# Patient Record
Sex: Female | Born: 1950 | Race: Black or African American | Hispanic: No | Smoking: Former smoker
Health system: Southern US, Community
[De-identification: ages and names within clinical notes are randomized; demographics above are authoritative.]

## PROBLEM LIST (undated history)

## (undated) DIAGNOSIS — G5600 Carpal tunnel syndrome, unspecified upper limb: Secondary | ICD-10-CM

## (undated) DIAGNOSIS — J449 Chronic obstructive pulmonary disease, unspecified: Secondary | ICD-10-CM

## (undated) DIAGNOSIS — M1990 Unspecified osteoarthritis, unspecified site: Secondary | ICD-10-CM

## (undated) DIAGNOSIS — I10 Essential (primary) hypertension: Secondary | ICD-10-CM

## (undated) DIAGNOSIS — C801 Malignant (primary) neoplasm, unspecified: Secondary | ICD-10-CM

## (undated) DIAGNOSIS — E7800 Pure hypercholesterolemia, unspecified: Secondary | ICD-10-CM

## (undated) HISTORY — DX: Unspecified osteoarthritis, unspecified site: M19.90

## (undated) HISTORY — PX: REPLACEMENT TOTAL KNEE: SUR1224

## (undated) HISTORY — DX: Carpal tunnel syndrome, unspecified upper limb: G56.00

## (undated) HISTORY — PX: TOENAIL EXCISION: SUR558

## (undated) HISTORY — PX: ABDOMINAL HYSTERECTOMY: SHX81

## (undated) HISTORY — PX: ABDOMINAL SURGERY: SHX537

## (undated) HISTORY — PX: CHOLECYSTECTOMY: SHX55

---

## 2001-02-08 ENCOUNTER — Encounter: Payer: Self-pay | Admitting: Orthopedic Surgery

## 2001-02-08 ENCOUNTER — Ambulatory Visit (HOSPITAL_COMMUNITY): Admission: RE | Admit: 2001-02-08 | Discharge: 2001-02-08 | Payer: Self-pay | Admitting: Orthopedic Surgery

## 2001-03-05 ENCOUNTER — Ambulatory Visit (HOSPITAL_COMMUNITY): Admission: RE | Admit: 2001-03-05 | Discharge: 2001-03-05 | Payer: Self-pay | Admitting: Orthopedic Surgery

## 2002-06-23 ENCOUNTER — Encounter: Payer: Self-pay | Admitting: Emergency Medicine

## 2002-06-23 ENCOUNTER — Emergency Department (HOSPITAL_COMMUNITY): Admission: EM | Admit: 2002-06-23 | Discharge: 2002-06-23 | Payer: Self-pay | Admitting: Emergency Medicine

## 2002-10-21 ENCOUNTER — Encounter: Payer: Self-pay | Admitting: Family Medicine

## 2002-10-21 ENCOUNTER — Inpatient Hospital Stay (HOSPITAL_COMMUNITY): Admission: AD | Admit: 2002-10-21 | Discharge: 2002-10-24 | Payer: Self-pay | Admitting: Family Medicine

## 2002-12-10 ENCOUNTER — Ambulatory Visit (HOSPITAL_COMMUNITY): Admission: RE | Admit: 2002-12-10 | Discharge: 2002-12-10 | Payer: Self-pay | Admitting: Family Medicine

## 2002-12-11 ENCOUNTER — Ambulatory Visit (HOSPITAL_COMMUNITY): Admission: RE | Admit: 2002-12-11 | Discharge: 2002-12-11 | Payer: Self-pay | Admitting: Family Medicine

## 2002-12-11 ENCOUNTER — Encounter: Payer: Self-pay | Admitting: Family Medicine

## 2003-06-28 ENCOUNTER — Ambulatory Visit: Admission: RE | Admit: 2003-06-28 | Discharge: 2003-06-28 | Payer: Self-pay | Admitting: *Deleted

## 2004-01-05 ENCOUNTER — Inpatient Hospital Stay (HOSPITAL_COMMUNITY): Admission: AD | Admit: 2004-01-05 | Discharge: 2004-01-08 | Payer: Self-pay | Admitting: Family Medicine

## 2004-01-28 ENCOUNTER — Encounter: Payer: Self-pay | Admitting: Orthopedic Surgery

## 2004-05-05 ENCOUNTER — Ambulatory Visit (HOSPITAL_COMMUNITY): Admission: RE | Admit: 2004-05-05 | Discharge: 2004-05-05 | Payer: Self-pay | Admitting: General Surgery

## 2004-06-03 ENCOUNTER — Inpatient Hospital Stay (HOSPITAL_COMMUNITY): Admission: RE | Admit: 2004-06-03 | Discharge: 2004-06-07 | Payer: Self-pay | Admitting: Orthopedic Surgery

## 2004-06-03 ENCOUNTER — Encounter: Payer: Self-pay | Admitting: Orthopedic Surgery

## 2004-07-18 ENCOUNTER — Encounter (HOSPITAL_COMMUNITY): Admission: RE | Admit: 2004-07-18 | Discharge: 2004-08-17 | Payer: Self-pay | Admitting: Orthopedic Surgery

## 2004-08-05 ENCOUNTER — Ambulatory Visit (HOSPITAL_COMMUNITY): Admission: RE | Admit: 2004-08-05 | Discharge: 2004-08-05 | Payer: Self-pay | Admitting: Family Medicine

## 2004-08-15 ENCOUNTER — Ambulatory Visit: Payer: Self-pay | Admitting: Orthopedic Surgery

## 2004-08-17 ENCOUNTER — Encounter (HOSPITAL_COMMUNITY): Admission: RE | Admit: 2004-08-17 | Discharge: 2004-09-16 | Payer: Self-pay | Admitting: Orthopedic Surgery

## 2004-09-05 ENCOUNTER — Ambulatory Visit: Payer: Self-pay | Admitting: Orthopedic Surgery

## 2004-11-03 ENCOUNTER — Ambulatory Visit: Payer: Self-pay | Admitting: Orthopedic Surgery

## 2005-01-25 ENCOUNTER — Ambulatory Visit (HOSPITAL_COMMUNITY): Admission: RE | Admit: 2005-01-25 | Discharge: 2005-01-25 | Payer: Self-pay | Admitting: Family Medicine

## 2005-02-10 ENCOUNTER — Ambulatory Visit (HOSPITAL_COMMUNITY): Admission: RE | Admit: 2005-02-10 | Discharge: 2005-02-10 | Payer: Self-pay | Admitting: Family Medicine

## 2005-02-20 ENCOUNTER — Ambulatory Visit: Payer: Self-pay | Admitting: Orthopedic Surgery

## 2005-03-08 ENCOUNTER — Emergency Department (HOSPITAL_COMMUNITY): Admission: EM | Admit: 2005-03-08 | Discharge: 2005-03-09 | Payer: Self-pay | Admitting: *Deleted

## 2005-03-09 ENCOUNTER — Inpatient Hospital Stay (HOSPITAL_COMMUNITY): Admission: AD | Admit: 2005-03-09 | Discharge: 2005-03-13 | Payer: Self-pay | Admitting: Family Medicine

## 2005-05-24 ENCOUNTER — Ambulatory Visit: Payer: Self-pay | Admitting: Orthopedic Surgery

## 2005-07-13 ENCOUNTER — Ambulatory Visit (HOSPITAL_COMMUNITY): Admission: RE | Admit: 2005-07-13 | Discharge: 2005-07-13 | Payer: Self-pay | Admitting: General Surgery

## 2005-09-18 HISTORY — PX: COLONOSCOPY: SHX174

## 2006-06-22 ENCOUNTER — Ambulatory Visit (HOSPITAL_COMMUNITY): Admission: RE | Admit: 2006-06-22 | Discharge: 2006-06-22 | Payer: Self-pay | Admitting: Family Medicine

## 2006-06-25 ENCOUNTER — Ambulatory Visit: Payer: Self-pay | Admitting: Orthopedic Surgery

## 2006-07-07 ENCOUNTER — Ambulatory Visit (HOSPITAL_COMMUNITY): Admission: RE | Admit: 2006-07-07 | Discharge: 2006-07-07 | Payer: Self-pay | Admitting: Family Medicine

## 2006-07-24 ENCOUNTER — Ambulatory Visit (HOSPITAL_COMMUNITY): Admission: RE | Admit: 2006-07-24 | Discharge: 2006-07-24 | Payer: Self-pay | Admitting: Internal Medicine

## 2006-07-24 ENCOUNTER — Ambulatory Visit: Payer: Self-pay | Admitting: Internal Medicine

## 2006-09-18 HISTORY — PX: ESOPHAGOGASTRODUODENOSCOPY: SHX1529

## 2006-10-29 ENCOUNTER — Ambulatory Visit: Payer: Self-pay | Admitting: Internal Medicine

## 2006-11-07 ENCOUNTER — Encounter (INDEPENDENT_AMBULATORY_CARE_PROVIDER_SITE_OTHER): Payer: Self-pay | Admitting: Specialist

## 2006-11-07 ENCOUNTER — Ambulatory Visit: Payer: Self-pay | Admitting: Internal Medicine

## 2006-11-07 ENCOUNTER — Ambulatory Visit (HOSPITAL_COMMUNITY): Admission: RE | Admit: 2006-11-07 | Discharge: 2006-11-07 | Payer: Self-pay | Admitting: Internal Medicine

## 2007-06-28 ENCOUNTER — Ambulatory Visit (HOSPITAL_COMMUNITY): Admission: RE | Admit: 2007-06-28 | Discharge: 2007-06-28 | Payer: Self-pay | Admitting: *Deleted

## 2007-07-16 ENCOUNTER — Ambulatory Visit (HOSPITAL_COMMUNITY): Admission: RE | Admit: 2007-07-16 | Discharge: 2007-07-16 | Payer: Self-pay | Admitting: Family Medicine

## 2007-10-09 ENCOUNTER — Ambulatory Visit (HOSPITAL_COMMUNITY): Admission: RE | Admit: 2007-10-09 | Discharge: 2007-10-09 | Payer: Self-pay | Admitting: Family Medicine

## 2007-10-27 ENCOUNTER — Emergency Department (HOSPITAL_COMMUNITY): Admission: EM | Admit: 2007-10-27 | Discharge: 2007-10-28 | Payer: Self-pay | Admitting: Emergency Medicine

## 2008-01-08 ENCOUNTER — Ambulatory Visit (HOSPITAL_COMMUNITY): Admission: RE | Admit: 2008-01-08 | Discharge: 2008-01-08 | Payer: Self-pay | Admitting: Family Medicine

## 2008-05-12 DIAGNOSIS — Z8679 Personal history of other diseases of the circulatory system: Secondary | ICD-10-CM

## 2008-05-12 DIAGNOSIS — E119 Type 2 diabetes mellitus without complications: Secondary | ICD-10-CM

## 2008-05-12 DIAGNOSIS — Z794 Long term (current) use of insulin: Secondary | ICD-10-CM

## 2008-05-12 DIAGNOSIS — N184 Chronic kidney disease, stage 4 (severe): Secondary | ICD-10-CM | POA: Insufficient documentation

## 2008-05-14 ENCOUNTER — Ambulatory Visit: Payer: Self-pay | Admitting: Orthopedic Surgery

## 2008-05-14 DIAGNOSIS — M5430 Sciatica, unspecified side: Secondary | ICD-10-CM

## 2008-05-14 DIAGNOSIS — M545 Low back pain: Secondary | ICD-10-CM

## 2008-07-17 ENCOUNTER — Ambulatory Visit (HOSPITAL_COMMUNITY): Admission: RE | Admit: 2008-07-17 | Discharge: 2008-07-17 | Payer: Self-pay | Admitting: Family Medicine

## 2008-09-23 ENCOUNTER — Ambulatory Visit: Payer: Self-pay | Admitting: Orthopedic Surgery

## 2008-09-23 DIAGNOSIS — Z96659 Presence of unspecified artificial knee joint: Secondary | ICD-10-CM | POA: Insufficient documentation

## 2008-09-23 DIAGNOSIS — M25569 Pain in unspecified knee: Secondary | ICD-10-CM

## 2008-09-23 DIAGNOSIS — M1710 Unilateral primary osteoarthritis, unspecified knee: Secondary | ICD-10-CM

## 2008-11-05 ENCOUNTER — Ambulatory Visit: Payer: Self-pay | Admitting: Orthopedic Surgery

## 2008-12-09 ENCOUNTER — Ambulatory Visit: Payer: Self-pay | Admitting: Orthopedic Surgery

## 2009-01-07 ENCOUNTER — Telehealth: Payer: Self-pay | Admitting: Orthopedic Surgery

## 2009-01-11 ENCOUNTER — Encounter: Payer: Self-pay | Admitting: Orthopedic Surgery

## 2009-01-12 ENCOUNTER — Inpatient Hospital Stay (HOSPITAL_COMMUNITY): Admission: RE | Admit: 2009-01-12 | Discharge: 2009-01-15 | Payer: Self-pay | Admitting: Orthopedic Surgery

## 2009-01-12 ENCOUNTER — Ambulatory Visit: Payer: Self-pay | Admitting: Orthopedic Surgery

## 2009-01-16 ENCOUNTER — Encounter: Payer: Self-pay | Admitting: Orthopedic Surgery

## 2009-01-18 ENCOUNTER — Telehealth: Payer: Self-pay | Admitting: Orthopedic Surgery

## 2009-01-26 ENCOUNTER — Ambulatory Visit: Payer: Self-pay | Admitting: Orthopedic Surgery

## 2009-02-03 ENCOUNTER — Telehealth: Payer: Self-pay | Admitting: Orthopedic Surgery

## 2009-02-03 ENCOUNTER — Encounter (INDEPENDENT_AMBULATORY_CARE_PROVIDER_SITE_OTHER): Payer: Self-pay | Admitting: *Deleted

## 2009-02-03 ENCOUNTER — Encounter: Payer: Self-pay | Admitting: Orthopedic Surgery

## 2009-02-09 ENCOUNTER — Encounter (HOSPITAL_COMMUNITY): Admission: RE | Admit: 2009-02-09 | Discharge: 2009-03-17 | Payer: Self-pay | Admitting: Orthopedic Surgery

## 2009-02-24 ENCOUNTER — Ambulatory Visit: Payer: Self-pay | Admitting: Orthopedic Surgery

## 2009-02-25 ENCOUNTER — Encounter: Payer: Self-pay | Admitting: Orthopedic Surgery

## 2009-02-26 ENCOUNTER — Encounter: Payer: Self-pay | Admitting: Orthopedic Surgery

## 2009-03-01 ENCOUNTER — Encounter: Payer: Self-pay | Admitting: Orthopedic Surgery

## 2009-03-15 ENCOUNTER — Encounter: Payer: Self-pay | Admitting: Orthopedic Surgery

## 2009-04-02 ENCOUNTER — Emergency Department (HOSPITAL_COMMUNITY): Admission: EM | Admit: 2009-04-02 | Discharge: 2009-04-02 | Payer: Self-pay | Admitting: Emergency Medicine

## 2009-04-07 ENCOUNTER — Ambulatory Visit: Payer: Self-pay | Admitting: Orthopedic Surgery

## 2009-05-03 ENCOUNTER — Telehealth: Payer: Self-pay | Admitting: Orthopedic Surgery

## 2009-07-07 ENCOUNTER — Ambulatory Visit: Payer: Self-pay | Admitting: Orthopedic Surgery

## 2009-07-19 ENCOUNTER — Ambulatory Visit (HOSPITAL_COMMUNITY): Admission: RE | Admit: 2009-07-19 | Discharge: 2009-07-19 | Payer: Self-pay | Admitting: Family Medicine

## 2009-09-29 ENCOUNTER — Telehealth: Payer: Self-pay | Admitting: Orthopedic Surgery

## 2009-09-29 ENCOUNTER — Ambulatory Visit: Payer: Self-pay | Admitting: Orthopedic Surgery

## 2009-09-29 DIAGNOSIS — M4800 Spinal stenosis, site unspecified: Secondary | ICD-10-CM

## 2009-09-29 DIAGNOSIS — M5126 Other intervertebral disc displacement, lumbar region: Secondary | ICD-10-CM

## 2009-10-04 ENCOUNTER — Encounter (INDEPENDENT_AMBULATORY_CARE_PROVIDER_SITE_OTHER): Payer: Self-pay | Admitting: *Deleted

## 2009-10-06 ENCOUNTER — Ambulatory Visit (HOSPITAL_COMMUNITY): Admission: RE | Admit: 2009-10-06 | Discharge: 2009-10-06 | Payer: Self-pay | Admitting: Orthopedic Surgery

## 2009-10-08 ENCOUNTER — Telehealth: Payer: Self-pay | Admitting: Orthopedic Surgery

## 2009-10-08 ENCOUNTER — Emergency Department (HOSPITAL_COMMUNITY): Admission: EM | Admit: 2009-10-08 | Discharge: 2009-10-09 | Payer: Self-pay | Admitting: Emergency Medicine

## 2009-10-13 ENCOUNTER — Ambulatory Visit: Payer: Self-pay | Admitting: Orthopedic Surgery

## 2009-10-14 ENCOUNTER — Telehealth: Payer: Self-pay | Admitting: Orthopedic Surgery

## 2009-10-28 ENCOUNTER — Ambulatory Visit: Payer: Self-pay | Admitting: Orthopedic Surgery

## 2009-11-18 ENCOUNTER — Ambulatory Visit: Payer: Self-pay | Admitting: Orthopedic Surgery

## 2009-12-09 ENCOUNTER — Encounter (INDEPENDENT_AMBULATORY_CARE_PROVIDER_SITE_OTHER): Payer: Self-pay | Admitting: *Deleted

## 2009-12-09 ENCOUNTER — Ambulatory Visit: Payer: Self-pay | Admitting: Orthopedic Surgery

## 2009-12-09 DIAGNOSIS — M5137 Other intervertebral disc degeneration, lumbosacral region: Secondary | ICD-10-CM

## 2010-01-05 ENCOUNTER — Telehealth: Payer: Self-pay | Admitting: Orthopedic Surgery

## 2010-01-19 ENCOUNTER — Ambulatory Visit: Payer: Self-pay | Admitting: Orthopedic Surgery

## 2010-02-21 ENCOUNTER — Ambulatory Visit: Payer: Self-pay | Admitting: Orthopedic Surgery

## 2010-04-25 ENCOUNTER — Ambulatory Visit: Payer: Self-pay | Admitting: Orthopedic Surgery

## 2010-05-02 ENCOUNTER — Encounter (HOSPITAL_COMMUNITY): Admission: RE | Admit: 2010-05-02 | Discharge: 2010-06-01 | Payer: Self-pay | Admitting: Orthopedic Surgery

## 2010-05-18 ENCOUNTER — Encounter: Payer: Self-pay | Admitting: Orthopedic Surgery

## 2010-06-03 ENCOUNTER — Encounter (HOSPITAL_COMMUNITY): Admission: RE | Admit: 2010-06-03 | Discharge: 2010-06-13 | Payer: Self-pay | Admitting: Orthopedic Surgery

## 2010-06-10 ENCOUNTER — Encounter: Payer: Self-pay | Admitting: Orthopedic Surgery

## 2010-06-27 ENCOUNTER — Ambulatory Visit: Payer: Self-pay | Admitting: Orthopedic Surgery

## 2010-07-21 ENCOUNTER — Ambulatory Visit (HOSPITAL_COMMUNITY): Admission: RE | Admit: 2010-07-21 | Discharge: 2010-07-21 | Payer: Self-pay | Admitting: Family Medicine

## 2010-07-26 ENCOUNTER — Telehealth: Payer: Self-pay | Admitting: Orthopedic Surgery

## 2010-09-29 ENCOUNTER — Encounter: Payer: Self-pay | Admitting: Orthopedic Surgery

## 2010-09-29 ENCOUNTER — Ambulatory Visit
Admission: RE | Admit: 2010-09-29 | Discharge: 2010-09-29 | Payer: Self-pay | Source: Home / Self Care | Attending: Orthopedic Surgery | Admitting: Orthopedic Surgery

## 2010-10-03 ENCOUNTER — Encounter (INDEPENDENT_AMBULATORY_CARE_PROVIDER_SITE_OTHER): Payer: Self-pay | Admitting: *Deleted

## 2010-10-08 ENCOUNTER — Encounter: Payer: Self-pay | Admitting: Family Medicine

## 2010-10-20 NOTE — Assessment & Plan Note (Signed)
Summary: 1 M RE-CK BACK/SEC HORIZ/CAF   Visit Type:  Follow-up  CC:  recheck back.  History of Present Illness: I saw [name] in the office today for a followup visit.  She is a 60 years old woman with the complaint of:  one month recheck back pain after chiropractor.  imaging: IMPRESSION: Mild degenerative disc disease at L2-3 and L5-S1.  Central canal and foramina are open at both levels.  No nerve root compression.  tried neurontin, lyrica.   Percocet 5 for pain, helps.  Chiropractor does not help much.  Has had 3 ESIs for back.  Left total knee xrays today, was supposed to come last month. DOS 12/2008         Allergies: 1)  ! Neurontin  Physical Exam  Additional Exam:  The knee continues to do well. She has excellent flexion. The knee. No swelling. No tenderness. She does have radiating pain from her hip down the lateral side. The leg into the foot.  The knee is very stable and anteriorly, posteriorly, and medial, lateral.     Impression & Recommendations:  Problem # 1:  TOTAL KNEE FOLLOW-UP (ICD-V43.65) Assessment Improved  postop x-rays and see me in the LEFT The  alignment was normal, PTF reduced without tilt or subluxation, no evidence of loosening.  IMPRESSION: normal appearance of the implant   Orders: Est. Patient Level III [id number]) Knee x-ray,  3 views [id number])  Problem # 2:  DEGENERATIVE DISC DISEASE, LUMBOSACRAL SPINE (ICD-722.52) Assessment: Unchanged  Orders: Est. Patient Level III [id number])  Medications Added to Medication List This Visit: 1)  Amitriptyline Hcl 50 Mg Tabs (Amitriptyline hcl) .Marland Kitchen.. 1 at bedtime  Patient Instructions: 1)  Try amitriptiline 2)  continue Percocet  3)  return in 4 weeks  Prescriptions: PERCOCET 5-325 MG TABS (OXYCODONE-ACETAMINOPHEN) 1 by mouth q 4 as needed pain  #90 x 0   Entered and Authorized by:   Arther Abbott MD   Signed by:   Arther Abbott MD on 01/19/2010   Method used:   Print then  Give to Patient   RxID:   [id number] AMITRIPTYLINE HCL 50 MG TABS (AMITRIPTYLINE HCL) 1 at bedtime  #30 x 2   Entered and Authorized by:   Arther Abbott MD   Signed by:   Arther Abbott MD on 01/19/2010   Method used:   Print then Give to Patient   RxID:   [id number]

## 2010-10-20 NOTE — Assessment & Plan Note (Signed)
Summary: 2 WK RE-CK BACK/SEC HORIZ/CAF   Visit Type:  Follow-up  CC:  back pain.  History of Present Illness: I saw [name] in the office today for a followup visit.  She is a 60 years old woman with the complaint of:  back pain.  Arthrotec and Percocet given last visit, could not afford Arthrotec.  Referred to Dr. Ace Gins for nerve block.  Today is 2 week recheck after med and Nerve block which was done 10/21/09.  Feels better.  The nerve block seemed to do well she is about 60% better.  Recommend getting a second.  She does not have any bowel or bladder dysfunction at this time  Recommocet 5 mg q.4 p.r.n. 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) 3)  Total Knee Follow-up  (ICD-V43.65) 4)  Knee, Arthritis, Degen.Jefm Miles  (ICD-715.96) 5)  Knee Pain  (ICD-719.46) 6)  Low Back Pain  (ICD-724.2) 7)  Sciatica  (ICD-724.3) 8)  Diabetes  (ICD-250.00) 9)  High Blood Pressure  (ICD-V12.50)  Current Medications (verified): 1)  Atenolol 2)  Verpapamil 3)  Metformin 4)  Lovastalin 5)  Pritosec 6)  Aspirin 7)  Triam Trene/hctz 8)  Crestor 10 Mg Tabs (Rosuvastatin Calcium) 9)  Apidra 100 Unit/ml Soln (Insulin Glulisine) 10)  Byetta 5 Mcg Pen 5 Mcg/0.13ml Soln (Exenatide) 11)  Avandamet 10-998 Mg Tabs (Rosiglitazone-Metformin) 12)  Ultracet 37.5-325 Mg Tabs (Tramadol-Acetaminophen) .Marland Kitchen.. 1 By Mouth Q 4 As Needed Pain 13)  Norco 5-325 Mg Tabs (Hydrocodone-Acetaminophen) .Marland Kitchen.. 1 By Mouth Q 4 As Needed Pain 14)  Lorcet Plus 7.5-650 Mg Tabs (Hydrocodone-Acetaminophen) .... One By Mouth Q 4 Hrs As Needed Pain 15)  Lomotil 2.5-0.025 Mg Tabs (Diphenoxylate-Atropine) .Marland Kitchen.. 1 By Mouth Q 4 As Needed Diarrhea 16)  Prednisone (Pak) 10 Mg Tabs (Prednisone) .... As Directed For 12 Days 17)  Percocet 5-325 Mg Tabs (Oxycodone-Acetaminophen) .Marland Kitchen.. 1  By Mouth Q 4 As Needed Pain 18)  Arthrotec 50 50-200 Mg-Mcg Tabs (Diclofenac-Misoprostol) .Marland Kitchen.. 1 By Mouth Two Times A Day   [has Ulcers]  Allergies (verified): 1)  ! 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 Patient Level II [id number])  Patient Instructions: 1)  return in 3 weeks  Prescriptions: PERCOCET 5-325 MG TABS (OXYCODONE-ACETAMINOPHEN) 1 by mouth q 4 as needed pain  #90 x 0   Entered and Authorized by:   Arther Abbott MD   Signed by:   Arther Abbott MD on 10/28/2009   Method used:   Print then Give to Patient   RxID:   [id number]

## 2010-10-20 NOTE — Miscellaneous (Signed)
Summary: referred to [facility] sent notes  Clinical Lists Changes

## 2010-10-20 NOTE — Assessment & Plan Note (Signed)
Summary: RECK AFTER 3RD ESA/SECURE HOR/BSF   Visit Type:  Follow-up  CC:  back pain.  History of Present Illness: 60 year old female treated for LEFT leg radiculopathy in the midst of a series of epidural injections  Medication Percocet for pain  IMPRESSION: Mild degenerative disc disease at L2-3 and L5-S1.  Central canal and foramina are open at both levels.  No nerve root compression.  L5-S1:  There is an annular tear and shallow right paracentral protrusion.  The central canal and foramina remain open.   she got all 3 shots her initial pain has subsided but she still has lingering pain which next her it's really by Percocet  We talked about possible further treatment options including surgery, therapy, chiropractic  she opted for chiropractic treatment at this time  Recommend one-month followup continue Percocet for pain    Allergies: 1)  ! Neurontin   Impression & Recommendations:  Problem # 1:  DEGENERATIVE DISC DISEASE, LUMBOSACRAL SPINE (ICD-722.52) Assessment Comment Only  Orders: Est. Patient Level II [id number])  Patient Instructions: 1)  Dr. Gypsy Decant chiropractor 2)  come back in a month

## 2010-10-20 NOTE — Miscellaneous (Signed)
Summary: Rehab Report  Rehab Report   Imported By: Ruffin Pyo 06/13/2010 08:55:42  _____________________________________________________________________  External Attachment:    Type:   Image     Comment:   External Document

## 2010-10-20 NOTE — Progress Notes (Signed)
Summary: Referral to Dr. Ace Gins.  Phone Note Outgoing Call   Call placed by: Santo Held,  October 14, 2009 2:23 PM Call placed to: Specialist Action Taken: Information Sent Summary of Call: I faxed a referral for this patient to Dr. Ace Gins to have a nerve block at L5.

## 2010-10-20 NOTE — Progress Notes (Signed)
Summary: wants Percocet prescription  Phone Note Call from Patient   Summary of Call: Devonn Sugawara ([birth year]) wants new prescription for percocet. Her # [phone] Initial call taken by: Ruffin Pyo,  January 05, 2010 10:40 AM    Prescriptions: PERCOCET 5-325 MG TABS (OXYCODONE-ACETAMINOPHEN) 1 by mouth q 4 as needed pain  #90 x 0   Entered and Authorized by:   Arther Abbott MD   Signed by:   Arther Abbott MD on 01/05/2010   Method used:   Print then Give to Patient   RxID:   [id number]

## 2010-10-20 NOTE — Letter (Signed)
Summary: medical records fax [facility]  medical records fax [facility]   Imported By: Ihor Austin 09/30/2010 18:17:41  _____________________________________________________________________  External Attachment:    Type:   Image     Comment:   External Document

## 2010-10-20 NOTE — Miscellaneous (Signed)
Summary: mri l spine aph 10/06/09 9:30am  Clinical Lists Changes   Secure horizons [phone] expires 11/18/09, MRI L spine without contrast scheduled for Wed 10/06/09 [facility] reg at 9:30am, to bring disc for fu appt in our office on 10/13/09 at 12 pm

## 2010-10-20 NOTE — Miscellaneous (Signed)
Summary: Physical therapy order  Physical therapy order   Imported By: Ihor Austin 04/25/2010 11:16:42  _____________________________________________________________________  External Attachment:    Type:   Image     Comment:   External Document

## 2010-10-20 NOTE — Progress Notes (Signed)
Summary: alot of pain  Phone Note Call from Patient Call back at Home Phone [phone]   Summary of Call: says her pain is aweful has to use cane, stopped taking lyrica because it made her feel weird, i called in steroid dose pack for her until she is seen for her results next week, any other suggestions. She is already taking Vicodin without relief. Initial call taken by: Peter Minium,  October 08, 2009 8:38 AM    New/Updated Medications: PREDNISONE (PAK) 10 MG TABS (PREDNISONE) as directed for 12 days Prescriptions: PREDNISONE (PAK) 10 MG TABS (PREDNISONE) as directed for 12 days  #1 x 0   Entered by:   Peter Minium   Authorized by:   Arther Abbott MD   Signed by:   Peter Minium on 10/08/2009   Method used:   Faxed to ...       [facility]  [address] (retail)       [place], [place]  [address]       Ph: [phone]       Fax: [phone]   RxID:   [id number]

## 2010-10-20 NOTE — Assessment & Plan Note (Signed)
Summary: 2 M RE-CK BACK/FOL'G PT/SEC HORIZ/CAF   Visit Type:  Follow-up  CC:  recheck back.  History of Present Illness:      Today is 2 month recheck back after taking Percocet 5, Amitryptiline and PT.  06/10/10 PT report in EMR.  The PT helped some, still having pain, the Amitryptiline helped her rest she is out of, Out of Percocet,   No numbness or tingling in legs, has crawling sensations in the legs, no cramping in legs, left leg feels weak.  overall her pain has decreased most of her pain continues to be in her lower back with occasionally radiating to the anterior portion of her LEFT thigh.  She would like to try a new pain medication other than Percocet.   Allergies: 1)  ! Neurontin   Impression & Recommendations:  Problem # 1:  DEGENERATIVE DISC DISEASE, LUMBOSACRAL SPINE (ICD-722.52)  Orders: Est. Patient Level II [id number])  Problem # 2:  SPINAL STENOSIS (ICD-724.00)  slightly improved recommend continue amitriptyline, and changed, hydrocodone  Orders: Est. Patient Level II [id number])  Patient Instructions: 1)  f/u in 3 months  2)  continue amitriptyline, and switch to Hydrococdone  Prescriptions: NORCO 5-325 MG TABS (HYDROCODONE-ACETAMINOPHEN) 1 by mouth q 4 as needed pain  #90 x 5   Entered and Authorized by:   Arther Abbott MD   Signed by:   Arther Abbott MD on 06/27/2010   Method used:   Print then Give to Patient   RxID:   [id number] AMITRIPTYLINE HCL 50 MG TABS (AMITRIPTYLINE HCL) 1 at bedtime  #60 x 5   Entered and Authorized by:   Arther Abbott MD   Signed by:   Arther Abbott MD on 06/27/2010   Method used:   Print then Give to Patient   RxID:   [id number]

## 2010-10-20 NOTE — Miscellaneous (Signed)
Summary: faxed referral to [facility] for esi has [facility] discount  Clinical Lists Changes

## 2010-10-20 NOTE — Progress Notes (Signed)
Summary: can't take Gabapentin  Phone Note Call from Patient   Summary of Call: [name] (28-Feb-2051) says she did not realize what medicine you had prescsribed until she got home.  Says she cannot take Gabapentin because it really messes with her mind and she is afraid to take it.  Uses [facility] in [place] Her # [phone] Initial call taken by: Ruffin Pyo,  September 29, 2009 2:07 PM  Follow-up for Phone Call        lyrica 50 mg three times a day  Follow-up by: Arther Abbott MD,  September 29, 2009 2:15 PM  Additional Follow-up for Phone Call Additional follow up Details #1::        routed to [facility] to call to pharmacy Additional Follow-up by: Ruffin Pyo,  September 29, 2009 4:29 PM    Additional Follow-up for Phone Call Additional follow up Details #2::    called it in to [facility] Follow-up by: Peter Minium,  September 29, 2009 4:45 PM  New/Updated Medications: LYRICA 50 MG CAPS (PREGABALIN) one by mouth tid Prescriptions: LYRICA 50 MG CAPS (PREGABALIN) one by mouth tid  #90 x 2   Entered by:   Peter Minium   Authorized by:   Arther Abbott MD   Signed by:   Peter Minium on 09/29/2009   Method used:   Telephoned to ...       [facility]  [address] (retail)       [address] [place], [place]  [address]       Ph: [phone]       Fax: [phone]   RxID:   [id number]

## 2010-10-20 NOTE — Progress Notes (Signed)
Summary: Wants Percocet prescription  Phone Note Call from Patient   Summary of Call: [name] ([birth year])  said the Hydrocodone is not helping her pain, even when she takes 2 tablets.  Asked if you will write a prescription for Percocet. Her # [phone] Initial call taken by: Ruffin Pyo,  July 26, 2010 3:49 PM    New/Updated Medications: PERCOCET 5-325 MG TABS (OXYCODONE-ACETAMINOPHEN) 1 q 6 hrs as needed pain Prescriptions: PERCOCET 5-325 MG TABS (OXYCODONE-ACETAMINOPHEN) 1 q 6 hrs as needed pain  #60 x 0   Entered and Authorized by:   Arther Abbott MD   Signed by:   Arther Abbott MD on 07/26/2010   Method used:   Print then Give to Patient   RxID:   [id number]

## 2010-10-20 NOTE — Assessment & Plan Note (Signed)
Summary: 3 M RE-CK BACK/RESP TO MED/SEC HORIZ/CAF   Visit Type:  Follow-up  CC:  recheck on back.  History of Present Illness:   This is a followup visit. The patient is complaining of increasing pain in her RIGHT leg RIGHT hip, radiating down into her RIGHT foot, which is severe, and unrelieved by hydrocodone.  Previously we have treated her for the following:   Problem # 1:  DEGENERATIVE DISC DISEASE, LUMBOSACRAL SPINE (ICD-722.52) Problem # 2:  SPINAL STENOSIS (ICD-724.00)    Her primary care doctor did put her on some steroids, which helped slightly.  She had the last MRI in January of last year, followed by 3 epidurals for LEFT leg pain.  She has not had any bowel or bladder dysfunction or infection. Symptoms such as fever or chills, or night sweats   Allergies: 1)  ! Neurontin  Physical Exam  Additional Exam:   * VS reviewed and were normal   *GEN: appearance was normal , moderate obesity  ** CDV: normal pulses temperature and no edema  * LYMPH nodes were normal   * SKIN was normal   * Neuro: normal sensation, reflexes remain intact equal and normal ** Psyche: AAO x 3 and mood was normal   straight leg raise is positive on the RIGHT at 30    Impression & Recommendations:  Problem # 1:  DEGENERATIVE DISC DISEASE, LUMBOSACRAL SPINE (ICD-722.52) Assessment Deteriorated  Orders: Misc. Referral (Misc. Ref) Est. Patient Level III [id number])  Problem # 2:  H N P-LUMBAR (ICD-722.10) Assessment: New possible exacerbation of previously disc disease.  Recommend epidural injection at L5, S1.  Resume amitriptyline, continue hydrocodone. Return in 6 weeks Misc. Referral (Misc. Ref)  Patient Instructions: 1)  ESI's again L5-S1 2)  continu hydrocodone &  3)  amitrytiline  4)  return in 6 weeks  Prescriptions: AMITRIPTYLINE HCL 50 MG TABS (AMITRIPTYLINE HCL) 1 at bedtime  #60 x 5   Entered and Authorized by:   Arther Abbott MD   Signed by:   Arther Abbott MD on 09/29/2010   Method used:   Print then Give to Patient   RxID:   [id number]    Orders Added: 1)  Misc. Referral [Misc. Ref] 2)  Est. Patient Level III [id number]

## 2010-10-20 NOTE — Assessment & Plan Note (Signed)
Summary: 4 WK RE-CK BACK/SEC HORIZ/CAF   Visit Type:  Follow-up  CC:  back pain.  History of Present Illness: BACK PAIN: followup.  Patient still has some back pain.  But amitriptyline seems to help.  She is also on Percocet.  imaging: IMPRESSION: Mild degenerative disc disease at L2-3 and L5-S1.  Central canal and foramina are open at both levels.  No nerve root compression.  MEDS: tried neurontin, lyrica.   Percocet 5 for pain, helps.  She was given   Amitriptyline Hcl 50 Mg Tabs (Amitriptyline hcl) .Marland Kitchen.. 1 at bedtime, LAST VISIT; she says that is helping her.  Chiropractor does not help much.  Has had 3 ESIs for back.  Allergies: 1)  ! Neurontin   Impression & Recommendations:  Problem # 1:  DEGENERATIVE DISC DISEASE, LUMBOSACRAL SPINE (ICD-722.52) Assessment Improved  slightly improved continue Percocet and amitriptyline.  Orders: Est. Patient Level II [id number])  Patient Instructions: 1)  Please schedule a follow-up appointment in 2 months. Prescriptions: PERCOCET 5-325 MG TABS (OXYCODONE-ACETAMINOPHEN) 1 by mouth q 4 as needed pain  #90 x 0   Entered and Authorized by:   Arther Abbott MD   Signed by:   Arther Abbott MD on 02/21/2010   Method used:   Print then Give to Patient   RxID:   [id number]

## 2010-10-20 NOTE — Assessment & Plan Note (Signed)
Summary: 3 WE RECK BACK/SEC HOR/BSF   Visit Type:  Follow-up  CC:  left leg pain .  History of Present Illness: followup visit radicular LEFT leg pain  The patient did not get as good a result with this injection as the previous I've advised to get the third continue Percocet followup with me in about 3 weeks if no improvement recommend neurosurgical consult    Allergies: 1)  ! Neurontin  Review of Systems       denies bowel or bladder dysfunction night sweats fevers.   Impression & Recommendations:  Problem # 1:  H N P-LUMBAR (ICD-722.10) Assessment Improved  slightly improved  Orders: Est. Patient Level II [id number])  Patient Instructions: 1)  get 3rd injection return in 3 weeks  2)  continue percocet  Prescriptions: PERCOCET 5-325 MG TABS (OXYCODONE-ACETAMINOPHEN) 1 by mouth q 4 as needed pain  #90 x 0   Entered and Authorized by:   Arther Abbott MD   Signed by:   Arther Abbott MD on 11/18/2009   Method used:   Print then Give to Patient   RxID:   [id number]

## 2010-10-20 NOTE — Assessment & Plan Note (Signed)
Summary: 2 MO RECK BACK/SEC HOR/BSF   Visit Type:  Follow-up  CC:  recheck back.  History of Present Illness: 60 year old female status post LEFT total knee arthroplasty with complaints of lower back pain and LEFT lower extremity pain.  She's had an MRI the findings are noted.  She's had 3 epidurals and still has not had any relief of her pain.  The only thing that seems to relieve her pain and let her sleep is amitriptyline and she is also on Percocet for pain.  We have also tried Neurontin and Lyrica.  She cannot tolerate those medicines.  She is also tried chiropractic and ablation and the only thing we haven't done is try some physical therapy.  Her MRI does not appear to be bad enough to warrant a surgical consult but we are getting close to the point where he need another opinion  imaging: IMPRESSION: Mild degenerative disc disease at L2-3 and L5-S1.  Central canal and foramina are open at both levels.  No nerve root compression.      Allergies: 1)  ! Neurontin  Physical Exam  Additional Exam:  Normal Appearance, Oriented x 3, Mood normal She is ambulating poorly limping favoring the LEFT side she has tenderness in the LEFT sciatic notch no tenderness in the midline of the back on the RIGHT side of the gluteal region  She still has normal confrontational testing in the lower extremities her knee looks fine range of motion is normal knee is stable no swelling or tenderness in the knee joint      Impression & Recommendations:  Problem # 1:  DEGENERATIVE DISC DISEASE, LUMBOSACRAL SPINE (ICD-722.52) Assessment Unchanged at this point I can only say that maybe we should try some physical therapy and continue her current medications Orders: Est. Patient Level II [id number])  Patient Instructions: 1)  f/u in 2 months  2)  start PT  Prescriptions: AMITRIPTYLINE HCL 50 MG TABS (AMITRIPTYLINE HCL) 1 at bedtime  #60 x 2   Entered and Authorized by:   Arther Abbott MD  Signed by:   Arther Abbott MD on 04/25/2010   Method used:   Print then Give to Patient   RxID:   [id number] PERCOCET 5-325 MG TABS (OXYCODONE-ACETAMINOPHEN) 1 by mouth q 4 as needed pain  #90 x 0   Entered and Authorized by:   Arther Abbott MD   Signed by:   Arther Abbott MD on 04/25/2010   Method used:   Print then Give to Patient   RxID:   [id number]

## 2010-10-20 NOTE — Miscellaneous (Signed)
Summary: PT clinical evaluation  PT clinical evaluation   Imported By: Ruffin Pyo 05/18/2010 08:32:13  _____________________________________________________________________  External Attachment:    Type:   Image     Comment:   External Document

## 2010-10-20 NOTE — Assessment & Plan Note (Signed)
Summary: LT KNEE PAIN/TKA SURG APR 2010/XRAY/SEC HORIZ/CAF   Visit Type:  Follow-up  CC:  left leg pain.  History of Present Illness: I saw [name] in the office today for a followup visit.  She is a 59 years old woman with the complaint of:  her LEFT leg pain with lower back pain.  The patient is status post LEFT total knee arthroplasty in April 2002, and did well. About 2 months ago, she started having pain in her LEFT hip and radiating down to her knee and LEFT calf. His unrelieved by Vicodin and Flexeril. Pain is severe, deep, and aching. There has been no red flags at this time.  Review of systems.  GEN: well developed and nourished. large  body habitus, grooming and hygiene  CDV: normal pulses perfusion color and temperature without swelling or edema  Lymph: normal lymph system  SKIN: normal without lesions, masses, nodules;  skin incision is normal   NEURO/PSYCH: Normal coordination, reflexes and sensation.  She is awake alert and oriented   MSK:L eft leg  tender back, buttocks left side  ROM normal left hip  left leg motor normal  left hip stable   old xrays show DDD L spine   Assessment:  DDx: Spinal stenosis; HNP   Plan MRI, Patient education, neurontin   Allergies: No Known Drug Allergies   Impression & Recommendations:  Problem # 1:  SPINAL STENOSIS (ICD-724.00) Assessment New  Orders: Est. Patient Level IV [id number])  Problem # 2:  H N P-LUMBAR (ICD-722.10) Assessment: New  old xrays show DDD L spine   Orders: Est. Patient Level IV [id number])  Medications Added to Medication List This Visit: 1)  Neurontin 100 Mg Caps (Gabapentin) .Marland Kitchen.. 1 po three times a day increase to 2 tabs three times a day if no relief after 48 hrs then if stll no relief increase to 3 tabs three times a day  Patient Instructions: 1)  MRI L spine  2)  come back for results  3)  Most patients (90%) of patients with low back pain will improve with time (2-6 weeks). Limit  activity to comfort and avoid activities that increase discomfort.  Apply moist heat and/or ice to lower back and take medication as instructed for pain relief. Please read the Back Pain Handout 4)  Medication: neurontin three times a day  Prescriptions: NEURONTIN 100 MG CAPS (GABAPENTIN) 1 po three times a day increase to 2 tabs three times a day if no relief after 48 hrs then if stll no relief increase to 3 tabs three times a day  #90 x 0   Entered and Authorized by:   Arther Abbott MD   Signed by:   Arther Abbott MD on 09/29/2009   Method used:   Print then Give to Patient   RxID:   [id number]

## 2010-10-20 NOTE — Assessment & Plan Note (Signed)
Summary: mri results L spine to bring disc aph.cbt   Visit Type:  Follow-up  CC:  left leg pain.  History of Present Illness: I saw [name] in the office today for a followup visit.  She is a 60 years old woman with the complaint of:  her LEFT leg pain with lower back pain.  The patient is status post LEFT total knee arthroplasty in April 2002, and did well. About 2 months ago, she started having pain in her LEFT hip and radiating down to her knee and LEFT calf. His unrelieved by Vicodin and Flexeril. Pain is severe, deep, and aching. There has been no red flags at this time.  prednisone pills helped some but sugar went up   She does not tolerate Lyrica or neurontin   old xrays show DDD L spine   Assessment:   MRI does not look bad but tthe clinical picture suggests a neurogenic cause for the pain  Plan: try root block , take percocet for pain, rest, heat   2 weeks f/u      Allergies: 1)  ! Neurontin   Medications Added to Medication List This Visit: 1)  Percocet 5-325 Mg Tabs (Oxycodone-acetaminophen) .Marland Kitchen.. 1 by mouth q 4 as needed pain 2)  Arthrotec 50 50-200 Mg-mcg Tabs (Diclofenac-misoprostol) .Marland Kitchen.. 1 by mouth two times a day   [has ulcers]  Other Orders: Est. Patient Level II [id number])  Patient Instructions: 1)  Nerve root block At L5 With Dr Ace Gins (can she get one this week) 2)  Take Percocet 5 mg 1 by mouth q 4 as needed pain and  3)  Arhtrotec 4)  return in 2 weeks  Prescriptions: ARTHROTEC 50 50-200 MG-MCG TABS (DICLOFENAC-MISOPROSTOL) 1 by mouth two times a day   [has ulcers]  #60 x 0   Entered and Authorized by:   Arther Abbott MD   Signed by:   Arther Abbott MD on 10/13/2009   Method used:   Print then Give to Patient   RxID:   [id number] PERCOCET 5-325 MG TABS (OXYCODONE-ACETAMINOPHEN) 1 by mouth q 4 as needed pain  #90 x 0   Entered and Authorized by:   Arther Abbott MD   Signed by:   Arther Abbott MD on 10/13/2009   Method used:    Print then Give to Patient   RxID:   [id number]

## 2010-10-20 NOTE — Miscellaneous (Signed)
Summary: neurontin makes her suicidal  Clinical Lists Changes  Allergies: Added new allergy or adverse reaction of NEURONTIN Observations: Added new observation of NKA: F (09/29/2009 16:45)

## 2010-10-20 NOTE — Letter (Signed)
Summary: [facility] discount information (secondary to insurance)  [facility] discount information (secondary to insurance)   Imported By: Ihor Austin 10/03/2010 11:14:23  _____________________________________________________________________  External Attachment:    Type:   Image     Comment:   External Document

## 2010-11-15 ENCOUNTER — Encounter: Payer: Self-pay | Admitting: Orthopedic Surgery

## 2010-11-15 ENCOUNTER — Ambulatory Visit (INDEPENDENT_AMBULATORY_CARE_PROVIDER_SITE_OTHER): Payer: Medicare Other | Admitting: Orthopedic Surgery

## 2010-11-15 DIAGNOSIS — M5137 Other intervertebral disc degeneration, lumbosacral region: Secondary | ICD-10-CM

## 2010-11-15 DIAGNOSIS — M4800 Spinal stenosis, site unspecified: Secondary | ICD-10-CM

## 2010-11-22 ENCOUNTER — Telehealth: Payer: Self-pay | Admitting: Orthopedic Surgery

## 2010-11-24 NOTE — Assessment & Plan Note (Signed)
Summary: 78 WK RE-CK BACK/SEC HORIZ/CAF   Visit Type:  Follow-up  CC:  recheck back pain.  History of Present Illness: [age]-year-old female status post LEFT total knee arthroplasty functioning well with that continues to have pain in her LEFT lower back and lateral thigh, occasionally into the lateral lower leg.  She had 2 epidural injections second series,  and has still not improved.  Problem # 1:  DEGENERATIVE DISC DISEASE, LUMBOSACRAL SPINE (ICD-722.52) Problem # 2:  SPINAL STENOSIS (ICD-724.00)   She complains that she requires a grocery cart to get through her shopping.  Often leaning over it to get relief.  Medications for this problem amitriptyline which seems to help her somewhat.  She was on Percocet is now on hydrocodone      Allergies: 1)  ! Neurontin   Impression & Recommendations:  Problem # 1:  DEGENERATIVE DISC DISEASE, LUMBOSACRAL SPINE (ICD-722.52) Assessment Deteriorated continued symptoms of spinal stenosis  Recommend neurosurgical consult.  In the meantime she can continue her amitriptyline Orders: Est. Patient Level II [id number])  Problem # 2:  SPINAL STENOSIS (ICD-724.00) Assessment: Deteriorated  Orders: Neurosurgeon Referral (Neurosurgeon) Est. Patient Level II [id number])  Patient Instructions: 1)  [facility] referral   Orders Added: 1)  Neurosurgeon Referral [Neurosurgeon] 2)  Est. Patient Level II [id number]

## 2010-12-06 NOTE — Progress Notes (Signed)
Summary: Referral to Neurosurgeon  Phone Note Outgoing Call   Call placed by: Santo Held,  November 22, 2010 3:31 PM Call placed to: Specialist Action Taken: Information Sent Summary of Call: I faxed a referral to [facility] for the patient to be seen for spinal stenosis of her lower back.  Follow-up for Phone Call        Received appointment information:  Scheduled at [facility] with Dr. Joya Salm: 12/12/10, 11:20am, 10:40am check-in.  Called patient to notify. Patient aware to bring films. Follow-up by: Ihor Austin,  November 30, 2010 6:06 PM

## 2010-12-13 ENCOUNTER — Other Ambulatory Visit (HOSPITAL_COMMUNITY): Payer: Self-pay | Admitting: Family Medicine

## 2010-12-13 DIAGNOSIS — Z780 Asymptomatic menopausal state: Secondary | ICD-10-CM

## 2010-12-16 ENCOUNTER — Encounter (HOSPITAL_COMMUNITY): Payer: Self-pay

## 2010-12-16 ENCOUNTER — Ambulatory Visit (HOSPITAL_COMMUNITY)
Admission: RE | Admit: 2010-12-16 | Discharge: 2010-12-16 | Disposition: A | Discharge status: Home / Self Care | Payer: Medicare Other | Source: Ambulatory Visit | Attending: Family Medicine | Admitting: Family Medicine

## 2010-12-16 DIAGNOSIS — M818 Other osteoporosis without current pathological fracture: Secondary | ICD-10-CM | POA: Insufficient documentation

## 2010-12-16 DIAGNOSIS — Z780 Asymptomatic menopausal state: Secondary | ICD-10-CM

## 2010-12-16 HISTORY — DX: Malignant (primary) neoplasm, unspecified: C80.1

## 2010-12-16 HISTORY — DX: Essential (primary) hypertension: I10

## 2010-12-25 LAB — COMPREHENSIVE METABOLIC PANEL
ALT: 10 U/L (ref 0–35)
AST: 17 U/L (ref 0–37)
Alkaline Phosphatase: 62 U/L (ref 39–117)
CO2: 26 mEq/L (ref 19–32)
Chloride: 101 mEq/L (ref 96–112)
Creatinine, Ser: 0.85 mg/dL (ref 0.4–1.2)
GFR calc Af Amer: >60 mL/min (ref >60)
GFR calc non Af Amer: >60 mL/min (ref >60)
Total Bilirubin: 1.1 mg/dL (ref 0.3–1.2)

## 2010-12-25 LAB — URINALYSIS, ROUTINE W REFLEX MICROSCOPIC
Glucose, UA: NEGATIVE mg/dL
Hgb urine dipstick: NEGATIVE
Protein, ur: NEGATIVE mg/dL

## 2010-12-25 LAB — DIFFERENTIAL
Basophils Absolute: 0.1 10*3/uL (ref 0.0–0.1)
Basophils Relative: 1 % (ref 0–1)
Eosinophils Absolute: 0.1 10*3/uL (ref 0.0–0.7)
Eosinophils Relative: 1 % (ref 0–5)
Lymphocytes Relative: 22 % (ref 12–46)

## 2010-12-25 LAB — CBC
HCT: 40 % (ref 36.0–46.0)
Hemoglobin: 13.9 g/dL (ref 12.0–15.0)
MCV: 91.3 fL (ref 78.0–100.0)
Platelets: 284 10*3/uL (ref 150–400)
RDW: 14.3 % (ref 11.5–15.5)
WBC: 10.2 10*3/uL (ref 4.0–10.5)

## 2010-12-25 LAB — LIPASE, BLOOD: Lipase: 13 U/L (ref 11–59)

## 2010-12-28 LAB — BASIC METABOLIC PANEL
BUN: 15 mg/dL (ref 6–23)
BUN: 17 mg/dL (ref 6–23)
CO2: 31 mEq/L (ref 19–32)
Calcium: 8.4 mg/dL (ref 8.4–10.5)
Calcium: 8.8 mg/dL (ref 8.4–10.5)
Calcium: 9.6 mg/dL (ref 8.4–10.5)
Chloride: 99 mEq/L (ref 96–112)
Creatinine, Ser: 0.79 mg/dL (ref 0.4–1.2)
GFR calc Af Amer: >60 mL/min (ref >60)
GFR calc Af Amer: >60 mL/min (ref >60)
GFR calc non Af Amer: >60 mL/min (ref >60)
GFR calc non Af Amer: >60 mL/min (ref >60)
GFR calc non Af Amer: >60 mL/min (ref >60)
GFR calc non Af Amer: >60 mL/min (ref >60)
Glucose, Bld: 166 mg/dL — ABNORMAL HIGH (ref 70–99)
Glucose, Bld: 194 mg/dL — ABNORMAL HIGH (ref 70–99)
Glucose, Bld: 224 mg/dL — ABNORMAL HIGH (ref 70–99)
Potassium: 3.6 mEq/L (ref 3.5–5.1)
Potassium: 3.7 mEq/L (ref 3.5–5.1)
Sodium: 133 mEq/L — ABNORMAL LOW (ref 135–145)
Sodium: 136 mEq/L (ref 135–145)

## 2010-12-28 LAB — CBC
HCT: 32.3 % — ABNORMAL LOW (ref 36.0–46.0)
HCT: 32.7 % — ABNORMAL LOW (ref 36.0–46.0)
Hemoglobin: 10.7 g/dL — ABNORMAL LOW (ref 12.0–15.0)
Hemoglobin: 11.1 g/dL — ABNORMAL LOW (ref 12.0–15.0)
MCHC: 34.5 g/dL (ref 30.0–36.0)
MCHC: 34.7 g/dL (ref 30.0–36.0)
Platelets: 209 10*3/uL (ref 150–400)
Platelets: 211 10*3/uL (ref 150–400)
Platelets: 272 10*3/uL (ref 150–400)
RBC: 3.39 MIL/uL — ABNORMAL LOW (ref 3.87–5.11)
RBC: 3.55 MIL/uL — ABNORMAL LOW (ref 3.87–5.11)
RDW: 13.8 % (ref 11.5–15.5)
RDW: 14 % (ref 11.5–15.5)
RDW: 14.2 % (ref 11.5–15.5)
RDW: 14.2 % (ref 11.5–15.5)
WBC: 10.4 10*3/uL (ref 4.0–10.5)
WBC: 11.5 10*3/uL — ABNORMAL HIGH (ref 4.0–10.5)
WBC: 9.2 10*3/uL (ref 4.0–10.5)

## 2010-12-28 LAB — CROSSMATCH

## 2010-12-28 LAB — PROTIME-INR
INR: 0.9 (ref 0.00–1.49)
INR: 1 (ref 0.00–1.49)
INR: 1.4 (ref 0.00–1.49)
Prothrombin Time: 13.4 seconds (ref 11.6–15.2)
Prothrombin Time: 17.6 seconds — ABNORMAL HIGH (ref 11.6–15.2)
Prothrombin Time: 19.7 seconds — ABNORMAL HIGH (ref 11.6–15.2)

## 2010-12-28 LAB — GLUCOSE, CAPILLARY
Glucose-Capillary: 147 mg/dL — ABNORMAL HIGH (ref 70–99)
Glucose-Capillary: 150 mg/dL — ABNORMAL HIGH (ref 70–99)
Glucose-Capillary: 155 mg/dL — ABNORMAL HIGH (ref 70–99)
Glucose-Capillary: 228 mg/dL — ABNORMAL HIGH (ref 70–99)
Glucose-Capillary: 95 mg/dL (ref 70–99)

## 2010-12-28 LAB — APTT: aPTT: 24 seconds (ref 24–37)

## 2011-01-31 NOTE — Discharge Summary (Signed)
NAMEAJUNI, PAGANI              ACCOUNT NO.:  [id number]   MEDICAL RECORD NO.:  [id number]          PATIENT TYPE:  INP   LOCATION:  A304                          FACILITY:  [facility]   PHYSICIAN:  Carole Civil, M.D.DATE OF BIRTH:  1951/03/20   DATE OF ADMISSION:  01/12/2009  DATE OF DISCHARGE:  04/30/2010LH                               DISCHARGE SUMMARY   ADMITTING DIAGNOSIS:  Osteoarthritis, left knee.   DISCHARGE DIAGNOSIS:  Osteoarthritis, left knee.   ATTENDING PHYSICIAN AND DISCHARGING PHYSICIAN:  Carole Civil, MD,  Linna Hoff, Orthopedics and Sports Medicine.   OPERATIVE PROCEDURE:  Left total knee arthroplasty.   DISCHARGE INSTRUCTIONS:  Jodell Cipro come out on postop 10, 11, or 12, INR  by Home Health on Monday May 3 to follow up with Dr. Aline Brochure.  The  patient will be seen by [facility].   DISCHARGE MEDICATIONS:  1. Apidra insulin 20 units daily, subcutaneously.  2. Metformin 500 mg 2 times a day.  3. Atenolol 50 mg daily.  4. Lisinopril 10 mg daily.  5. Verapamil 240 mg daily.  6. Aspirin 81 mg daily.  7. Crestor 10 mg daily.  8. Triamterene and hydrochlorothiazide 37.5/25 mg daily.  9. Aciphex 20 mg daily.  10.Coumadin 5 mg daily for 6 days.  11.Percocet 5/325 mg p.o. q.4 p.r.n. pain #90.   Coumadin and warfarin instructions were given.   HISTORY:  This is a 60 year old female status post right total knee  arthroplasty in 2005, did well, presents with left knee pain, failed  arthroscopic and Synvisc injections for the left knee, presents with  disabling pain, unresponsive to Vicodin, wish to have knee replacement  surgery.  X-rays confirmed osteoarthritis and she presented for surgery  on the day stated above.   HOSPITAL COURSE:  On January 12, 2009, she underwent left total knee  arthroplasty with a Biomet size 31-8 patella, 60 PSF femur, 71 tibia  with a 12-mm Orcon polyethylene component, as Aline Brochure was Manufacturing engineer,  assisted by [employer],  Caryl Pina, and Small.   FINDINGS:  Lateral wear and patellofemoral wear, severe.   Surgery was done under spinal anesthetic.   No complications were noted during the procedure.   The patient started a total knee protocol which included a CPM machine,  Physical Therapy and Coumadin.  She did well.  She had no problems in  the postoperative period.  Her range of motion was -4 to 85 degrees.  She was ambulating more than 60 feet.   LABORATORY STUDIES:  Hemoglobin discharge 10.7 on the 30th, INR 1.6 at  discharge, glucose 166, potassium 3.4.   The patient is to follow up with me 2 weeks postop for wound check,  staple removal if not done.  She will have [place] with a CPM  machine and gait training and physical therapy.   CONDITION:  Improved.   DISCHARGE DISPOSITION:  To home.      Carole Civil, M.D.  Electronically Signed     SEH/MEDQ  D:  01/28/2009  T:  01/29/2009  Job:  [id number]

## 2011-01-31 NOTE — Op Note (Signed)
NAMEFRANCILLE, [name]              ACCOUNT NO.:  [id number]   MEDICAL RECORD NO.:  [id number]          PATIENT TYPE:  INP   LOCATION:  A304                          FACILITY:  [facility]   PHYSICIAN:  Carole Civil, M.D.DATE OF BIRTH:  08-11-1951   DATE OF PROCEDURE:  DATE OF DISCHARGE:                               OPERATIVE REPORT   HISTORY AND INDICATIONS FOR SURGERY:  She is 60 years old.  She had a  right total knee replacement with Stryker Scorpio implant in September  2005, did well, complains of severe left knee pain which was initially  treated with arthroscopy followed by Synvisc injections and this helped  her for several years; however, she presented back with severe pain  unrelieved by Ultracet, Lortab, weight loss, exercise, and injection.   She opted for total knee replacement on the left.   PREOPERATIVE DIAGNOSIS:  Osteoarthritis, left knee.   POSTOPERATIVE DIAGNOSIS:  Osteoarthritis, left knee.   PROCEDURE:  Left total knee arthroplasty.   SURGEON:  Carole Civil, MD   ASSISTED BY:  Osvaldo Human, Patsi Sears, Simonne Maffucci.   TOURNIQUET TIME:  98 minutes.   BLOOD LOSS:  None.   OPERATIVE FINDINGS:  She had severe wear of the lateral femoral condyle  with osteophytes there, patellofemoral arthritis, tibial arthritic  changes laterally as well, mild medial compartment arthrosis.   Counts were correct.  There were no anesthetic complications.   IMPLANT SIZES:  We used a Biomet posterior stabilized total knee system.  We used a size 60 femur, 71 tibia, 12-mm polyethylene ArCom insert, and  a size 31, 8-mm patella.   DETAILS OF PROCEDURE:  Proper site marking and patient identification  were done in the preop area.  The chart was reviewed and updated.  The  patient was taken to Surgery where she had a spinal anesthetic and a  gram of Ancef IV.  She was placed supine on the operating table where a  Foley catheter was placed, and her left leg was  prepped with DuraPrep  and draped sterilely.   Time-out procedure was then completed.   The tourniquet was elevated to 300 mmHg after exsanguination with a 6-  inch Esmarch.  The leg was then placed in flexion and a straight midline  incision was made.   A medial arthrotomy was performed.  The patella was everted laterally.  The ACL and PCL were resected.  Lateral medial menisci were removed, and  the dissection was carried out medially to the mid coronal plane of the  tibia.  The tibia was subluxated forward and the external tibial guide  was set for neutral cut removing 10 mm from the less worn medial side.  We set the guide for a neutral cut and then used an oscillating saw to  remove the proximal tibial bone.  Tibia then measured to a size 71.   The femoral preparation was then performed.  We used a drill to enter  the femoral canal.  We decompressed it with suction and irrigation, set  the tibia for a 10-mm cut.  We got an air ball  on the lateral side, so  we then took an additional 3 mm.  We then checked extension gap, and we  were able to get a 10-mm block in.   We then sized the femur to a size 60, pinned it in 5 degrees external  rotation and then placed a 4 in 1 cutting block, made the 4 cuts,  removed posterior bone from the condyles with an osteotome using a  lamina spreader to open up the flexion gap.  At this point, we checked  the flexion gap and we were able to get a 12-mm block in place.  We  rechecked the extension gap, and 12-mm block was also easily placed with  good extension.   We then made the box femoral cut, drilled our peg holes, prepared the  patella, resected it from a 20 to a 12 mm size to a size 31.   We then did a trial reduction with a 10-mm and a 12-mm insert.  The 12  mm gave the best overall stability and motion, and we then punched the  tibia using the rotation as set by the trial reduction.   We then irrigated the bone, mixed the cement.  We  followed that with  implantation of the implants.  We allowed the cement to cure.  We  removed excess cement, irrigated the knee joint again.   We then placed a 12-mm insert and locked it in place and then took the  knee through a range of motion.  The patella tracked normally with no  thumb technique.  There was good stability and flexion and extension.   We then injected the knee with 60 mL of Marcaine with epinephrine,  closed the capsule in extensor mechanism with #1 Bralon in interrupted  and running fashion with the knee in flexion.   The subcu tissues were closed with 0 Monocryl and staples over a pain  pump catheter.   Sterile dressings were applied and then an x-ray was obtained which  showed that the implants were in good position with appropriate  alignment.   We then placed the Ace bandages and the Cryo/Cuff, and the patient was  taken to the recovery room in stable condition.   We will use Coumadin for DVT prophylaxis along with foot pumps and early  mobilization.   Routine total knee protocol.      Carole Civil, M.D.  Electronically Signed     SEH/MEDQ  D:  01/12/2009  T:  01/12/2009  Job:  [id number]

## 2011-02-02 ENCOUNTER — Other Ambulatory Visit: Payer: Self-pay | Admitting: Orthopedic Surgery

## 2011-02-02 DIAGNOSIS — R52 Pain, unspecified: Secondary | ICD-10-CM

## 2011-02-03 NOTE — Discharge Summary (Signed)
NAMETERRISA, [name]              ACCOUNT NO.:  [id number]   MEDICAL RECORD NO.:  [id number]          PATIENT TYPE:  INP   LOCATION:  [id number]                          FACILITY:  [facility]   PHYSICIAN:  Carole Civil, M.D.DATE OF BIRTH:  [birth year]   DATE OF ADMISSION:  06/03/2004  DATE OF DISCHARGE:  09/20/2005LH                                 DISCHARGE SUMMARY   ADMISSION DIAGNOSIS:  Osteoarthritis of the right knee.   DISCHARGE DIAGNOSIS:  Osteoarthritis of the right knee.   PROCEDURE:  Right total knee replacement on June 03, 2004.  Surgeon was  Dr. Aline Brochure.  Implant Scorpio Striker posterior stabilized knee.   HOSPITAL COURSE:  This is a 60 year old female with a history of peptic  ulcer disease due to anti-inflammatories.  She had increasing right knee  pain unresponsive to non-operative therapy.  She presented with a valgus  knee with severe deformity, ambulating with a cane, in constant pain.   On the day of admission, she underwent an uncomplicated right total knee  replacement.  She did well in the postoperative period.  Consultation was  obtained with Dr. Berdine Addison, the primary care physician.  At the time of  discharge, she had a 40 foot gait with standby assist.  Her range of motion  was -2 to 60.  She was advancing on the CPM 10 degrees per day.  Pain level  was a 4 or less.   DISCHARGE MEDICATIONS:  1.  Lorcet.  2.  Feosol.  3.  All of her home medications which included the following:  Atenolol,      Altace, Verapamil, Metformin, Triamterene, hydrochlorothiazide,      Lovastatin, Prilosec.   She was discharged with home physical therapy, occupational therapy as  needed.  Full weightbearing.  She had a CryoCuff which she was to use one  hour t.i.d.   FOLLOWUP:  Follow up visit scheduled for June 15, 2004.   CONDITION ON DISCHARGE:  Overall condition improved, ambulatory with a  walker.     Cherlynn Kaiser   SEH/MEDQ  D:  06/17/2004  T:  06/18/2004  Job:   [id number]

## 2011-02-03 NOTE — H&P (Signed)
NAMESRAH, CAYTON                        ACCOUNT NO.:  [id number]   MEDICAL RECORD NO.:  [id number]                   PATIENT TYPE:  OBV   LOCATION:  A311                                 FACILITY:  [facility]   PHYSICIAN:  Barrie Folk. Berdine Addison, M.D.                DATE OF BIRTH:  [birth year]   DATE OF ADMISSION:  01/04/2004  DATE OF DISCHARGE:                                HISTORY & PHYSICAL   HISTORY:  The patient is a 60 year old single, gravida 2, para 2, AB 0 black  female from [place], [place].   CHIEF COMPLAINT:  Severe, progressive, persistent right chest pain.  Pain  has been present x2 weeks.  Pain is described as a stabbing pressure.  Patient experiences pain secondary to bending right, diagonal, forward.  She  denies trauma to chest wall, fall, chest wall lesion, etc.  Patient has just  gotten over a recent cough x1 week.  Patient denies shortness of breath.  Patient sleeps on one pillow.   MEDICAL HISTORY:  Is positive for:  1. Hypertension.  2. Type 2 diabetes mellitus.  3. Osteoporosis.  4. Osteoarthritis.   MEDICAL HISTORY:  Is negative for:  1. Tuberculosis.  2. Sickle cell.  3. Asthma.  4. Seizure disorder.   PAST MEDICAL HISTORY:  Positive for hospitalizations with:  1. Acute bronchitis with hypoxemia in June of 2001, at [facility].  2. Acute viral influenza with hypoxemia in December of 1993.  3. Tonsillectomy, uvulectomy and adenoid surgery in October, 1993 at     __________ [facility].  4. Bilateral salpingo-oophorectomy in October, 1986 secondary to salpingo-     oophoritis with hemorrhagic corpus luteum of left ovary.  5. Cholecystectomy.  6. Hysterectomy secondary to cervical cancer.  7. Chest pain and shortness of breath secondary to paroxysmal atrial flutter     in November, 1997.  8. Hospitalization for acute bronchitis in February of 2004.   PRESCRIBED MEDS ON ADMISSION:  1. Metformin ER 500 mg p.o. daily with breakfast.  2.  Triamterene/HCTZ 37.5/25 one tablet p.o. every day.  3. Altace 2.5 mg p.o. daily.  4. Atenolol 50 mg p.o. daily.  5. Verapamil __________ 120 mg p.o. daily.  6. Cataflam 50 mg p.o. daily.   Patient is NOT ALLERGIC TO ANY KNOWN MEDICATION.   FAMILY HISTORY:  Revealed mother deceased, age 72, secondary to complication  of stroke; father deceased, age 39, secondary to complication of diabetes  mellitus; one brother living, age 16, good health; one daughter living, age  35, good health, and one son living, age 45, good health.   REVIEW OF SYSTEMS:  Positive bout of chronic knee pain and stiffness,  episodic wheezing, decreased exercise, endurance, episodic diarrhea.  Review  of systems negative for hemoptysis, hematemesis, epistaxis, bleeding gums,  gross hematuria, dysuria, melena, weight loss, lumps in breast, discharge  from breasts, vaginal bleeding, swelling of legs, etc.  PHYSICAL EXAM:  VITALS:  Temperature 98.1, pulse 73, respirations 22, blood  pressure 176/86.  GENERAL APPEARANCE:  Reveals a middle-aged, overweight, medium height, alert  black female who appeared not to feel well but in no apparent respiratory  distress.  SKIN:  Warm and dry.  HEAD:  Normocephalic.  EARS:  Normal auricle.  External canal with some cerumen.  Tympanic membrane  pearly gray.  EYES:  Lids negative for ptosis, sclera white.  Pupils round, equal and  reactive to light, extraocular movements intact.  NOSE:  Negative for discharge.  MOUTH:  Positive upper and lower dentures, no oral lesions.  Posterior  pharynx benign.  Absent uvula.  NECK:  Negative for lymphadenopathy or thyromegaly.  LUNGS:  Moving air bilaterally, no wheezes.  HEART:  Audible S1, S2 without murmur.  Regular rate and rhythm.  RIGHT ANTERIOR LOWER RIB:  Positive tenderness on palpation.  RIGHT POSTERIOR AREA BENEATH RIGHT SCAPULAR AREA:  Positive tenderness on  palpation, bending diagonal, right, forward.  ABDOMEN:  Obese.   Positive for mild mid epigastric tenderness.  Positive old  healed right upper quadrant surgical scar.  No palpable masses.  No  organomegaly.  PELVIC:  Deferred.  RECTAL:  Deferred.  EXTREMITIES:  Knees with positive crepitus, distant tenderness with flexion  and extension.  No tibial edema.  Palpable dorsalis pedis bilaterally.  NEUROLOGIC:  Alert and oriented to person, place and time.  Cranial nerves  II through XII appeared intact.   Erythrocyte sedimentation rate 7 (normal 0-25), D-dimer 0.33 (normal 0-0.48  mcg/mL), sodium 136, potassium 4.1, chloride 101, CO2 28, glucose 171, BUN  21, creatinine 1.0, total bilirubin of 0.7.  Alkaline phosphatase 66, SGOT  9, SGPT 19, total protein 6.2, albumin 3.6, calcium 9.2.  Total creatinine  98, CK MB 1.5.  Troponin-I less than 0.01.  Urinalysis:  Specific gravity of  1.020, pH 6.5, no glucose, no bilirubin, no ketone, no blood, no protein,  nitrite negative, leukocyte esterase trace, 7-10 WBCs, many bacteria.  Pending are chest x-ray and flat plate upright of abdomen.   IMPRESSION:  Right chest wall pain, musculoskeletal.   SECONDARY DIAGNOSES:  1. Hypertension.  2. Type 2 diabetes mellitus.  3. Osteoporosis.  4. Osteoarthritis.   PLAN:  1. IV normal saline at [facility].  2. Toradol 30 mg IV q.6 hours x72 hours.  3. Solu-Medrol 125 mg IV q.6 hours.  4. Diet:  4 gm sodium.  5. Activity:  As tolerated.  6. Metformin ER 500 mg p.o. daily with breakfast.  7. Maxzide 37.5/25 one tablet p.o. daily.  8. Altace 2.5 mg p.o. daily.  9. Atenolol 50 mg p.o. daily.  10.      Verapamil 120 mg p.o. daily.  11.      Accu-Chek a.c. and bedtime.  12.      Bedside commode.  13.      Fasting lipid profile.  14.      Hemoglobin A1c.     ___________________________________________                                         Barrie Folk. Berdine Addison, M.D.   Armanda Heritage  D:  01/04/2004  T:  01/04/2004  Job:  [id number]

## 2011-02-03 NOTE — Op Note (Signed)
NAMEEADIE, ZIESMER                        ACCOUNT NO.:  [id number]   MEDICAL RECORD NO.:  [id number]                   PATIENT TYPE:  INP   LOCATION:  [id number]                                 FACILITY:  [facility]   PHYSICIAN:  Carole Civil, M.D.           DATE OF BIRTH:  May 30, 1951   DATE OF PROCEDURE:  06/03/2004  DATE OF DISCHARGE:                                 OPERATIVE REPORT   HISTORY:  This is a 60 year old female with end-stage osteoarthritis and  disabling pain of the right knee who presented with a right total knee  replacement.   INDICATIONS FOR PROCEDURE:  Pain.   PREOPERATIVE DIAGNOSIS:  Osteoarthritis, right knee.   POSTOPERATIVE DIAGNOSIS:  Osteoarthritis, right knee.   PROCEDURE:  Right total knee replacement.   IMPLANTS:  Scorpio size 5 tibia, femur, patella, and a 15-mm polyethylene  insert.   SURGEON:  Carole Civil, M.D.   ANESTHESIA:  Spinal   OPERATIVE FINDINGS:  Deficit lateral femoral condyle, osteoarthritis of the  knee, deficit lateral meniscus and ACL.   SPECIMENS:  Bone fragments from the osteotomies.   DRAINS:  1 Hemovac and 1 pain pump catheter.   DETAILS OF PROCEDURE:  The patient was identified by arm band as [employer]. She marked her right knee as the operative site, and I signed my  initials over her mark. She was given Ancef and taken to the operating room  for spinal anesthetic. She was placed supine on the operating table where  she had a sterile prep and drape.   Time out was taken as required, and the procedure, patient, and limb were  correctly identified and confirmed. We exsanguinated the limb with a 6-inch  Esmarch. We inflated the tourniquet to 350 mmHg. We made a longitudinal  incision centered over the patella, extended it proximally and distally and  carried it down to the extensor mechanism. A medial arthrotomy was  performed, a synovectomy was performed, the medial and lateral menisci were  removed, the  ACL and PCL were removed. The tibia was subluxated forward.   The tibial guide was set for neutral position with a 0 degree slope. A 10-mm  bone cut was made measuring off the normal medial side. The base plate  measured a size 5.   We drilled into the femur through the notch area into the canal. We  suctioned and irrigated the canal, placed a fluted guide rod to decompress  the canal and then measured the distal cut for 10 mm. Because of deficiency  of the lateral femoral condyle, this had to be increased to 12 mm. We then  measured the femur, and it measured a 6. We pinned for a 7 and cut with a 5  block. After the block cuts were made, we checked the flexion and extension  gaps, and a 23-block fit the flexion extension gaps equally.   We then made the punches for  the tibia. We did a trial. No further releases  were necessary. We cut the patella from a 20 down to a 12, sized it to a 5,  drilled 3 peg holes, and the irrigated the knee.   Once the bone was irrigated and dried, the prosthesis was cemented into  place starting with the tibia, then the femur, then the patella. The cement  was allowed to cure. The knee was irrigated. Excess cement was removed. Bone  fragments were removed, and a Hemovac drain was placed. The extensor  mechanism was closed with a combination of interrupted and running Prolene  suture. Patellar tracking was checked and was found to be normal. The subcu  pain pump catheter was placed in the subcu space, and the subcu space was  closed with 0 and 2-0 Vicryl. The skin was closed with staples. The knee was  wrapped was sterilely, and a Cryo Cuff was placed. The tourniquet was  released. The patient was taken to recovery room in stable condition.   The postoperative plan is for routine physical therapy, occupational  therapy, CPM machine, DVT prophylaxis, and regular protocol. Anticipated  discharge 4 days.      ___________________________________________                                             Carole Civil, M.D.   SEH/MEDQ  D:  06/03/2004  T:  06/03/2004  Job:  [id number]

## 2011-02-03 NOTE — H&P (Signed)
NAMEEVANI, BURDI              ACCOUNT NO.:  [id number]   MEDICAL RECORD NO.:  [id number]          PATIENT TYPE:  INP   LOCATION:  A302                          FACILITY:  [facility]   PHYSICIAN:  Barrie Folk. Berdine Addison, MD     DATE OF BIRTH:  May 19, 1951   DATE OF ADMISSION:  03/09/2005  DATE OF DISCHARGE:  LH                                HISTORY & PHYSICAL   IDENTIFYING DATA:  The patient is a 60 year old single gravida 2, para 2, AB  0 black female from [place], [place].   CHIEF COMPLAINT:  Shortness of breath, wheezing, cough, general malaise.   HISTORY OF PRESENT ILLNESS:  The patient was seen in the emergency room on  March 08, 2005 because of these symptoms.  The symptoms had progressed since  March 05, 2005.  Wheezing started on March 06, 2005.  The cough was productive  at times.  The sputum is described as yellow.  She was also positive for  runny nose (yellow) and headache.  She denied nausea, vomiting, diarrhea,  diaphoretic spell, hemoptysis, and chest pain.  Chest x-ray demonstrated  minimal left lower lung segmental atelectasis scar or early infiltrate, as  read by Dr. Arne Cleveland on March 08, 2005.  Medical history is positive  for chronic bronchitis, irritable bowel syndrome, type 2 diabetes mellitus,  hypertension, and osteoarthritis.   PAST MEDICAL HISTORY:  Negative for tuberculosis, cancer, sickle cell, and  seizure disorder.   MEDICATIONS ON ADMISSION:  1.  Hydrocodone 7.5/500 1 tablet p.o. q.6-8h. p.r.n. pain.  2.  Niravam 0.5 mg p.o. every day.  3.  Advair 100/50 1 puff q.12h.  4.  Prilosec 1 tablet 20 mg p.o. daily.  5.  Metaglip 5/500 p.o. every day with supper.  6.  Triamterene/HCTZ 37.5/25 1 tablet p.o. daily.  7.  Verapamil 120 mg p.o. every day.  8.  Lisinopril 10 mg p.o. daily.  9.  Atenolol 50 mg p.o. daily.  10. Zelnorm 6 mg p.o. b.i.d.   Patient is not allergic to any known medications.   HABITS:  Positive for cigarette smoking.  Habits are  negative for the use of  ethanol or street drugs.   FAMILY HISTORY:  Mother deceased at age 67 secondary to complications of a  stroke.  Father deceased at age 9 secondary to complications of diabetes  mellitus.  One brother living at age 53, in good health.  One daughter  living at age 58, in good health.  One son living at age 42, in good health.   PAST MEDICAL HISTORY:  1.  Positive hospitalization for right knee replacement secondary to severe      degenerative joint disease in September, 2005 by Dr. Aline Brochure.  2.  Acute bronchitis with hypoxemia in June, 2001 at [facility].  3.  Acute viral influenza with hypoxemia in December, 1993.  4.  Tonsillectomy.  5.  Uvulectomy.  6.  Adenoid surgery in October, 1993 at [facility].  7.  Bilateral salpingo-oophorectomy in October, 1986 secondary to salpingo-      oophoritis with hemorrhagic corpus  luteum of the left ovary.  8.  Cholecystectomy.  9.  Hysterectomy secondary to cervical cancer.  10. Chest pain and shortness of breath secondary to paroxysmal atrial      flutter in November, 1997.  11. Acute bronchitis in February, 2004.  12. Atypical chest pain with peptic ulcer disease in April, 2005.   REVIEW OF SYSTEMS:  Positive for chronic resolving right pectoral-area  soreness, episodic.  Chronic intermittent shortness of breath.  Chronic  episodic productive cough.  Episodic hyperactive bowel sounds.  Episodic  watery stools after meals.  Bloating with meals.  Chronic pain and stiffness  of the left knee.  Chronic stiffness of the right knee.  Review of systems  are negative for epistaxis, bleeding gums, hematemesis, dysuria, gross  hematuria, vaginal bleeding, melena, edema of the legs, dysphagia, etc.   PHYSICAL EXAMINATION:  VITAL SIGNS:  Temperature 98.4, pulse 97,  respirations 28, blood pressure 158/82.  GENERAL APPEARANCE:  A middle-aged, overweight, medium height, alert black  female who appeared  not to feel well but in no respiratory distress at rest.  HEENT:  Head normocephalic.  Ears:  Normal auricle.  External canals patent.  Tympanic membranes are pearly gray.  Eyes/lids:  Negative for ptosis.  Sclerae are white.  Pupils are equal, round and reactive to light.  Extraocular movements are intact.  Nose:  Negative discharge.  Mouth:  Positive upper and lower dentures.  Posterior pharynx benign.  NECK:  Negative for lymphadenopathy or thyromegaly.  LUNGS:  positive for bilateral wheezes.  HEART:  Audible S1 and S2 without murmur.  Irregular rate and rhythm.  BREASTS:  Large.  No skin changes.  Positive tenderness involving superior  aspect of breasts on palpation.  Nipples erect without discharge.  No  palpable masses.  ABDOMEN:  Obese.  Hypoactive bowel sounds.  Positive healed, old right upper  quadrant surgical scar.  Soft and nontender in all four quadrants.  No  palpable masses.  No organomegaly.  GENITOURINARY.  Normal female.  RECTAL:  Deferred.  EXTREMITIES:  Right knee:  Positive healed old surgical scar.  Left knee:  No swelling, redness, or heat.  Flexion and bending of both knees with  minimal tenderness.  Tibia:  No edema.  Feet:  Palpable dorsalis pedis  bilaterally.  NEUROLOGIC:  Alert and oriented to person, place, and time.  Cranial nerves  II-XII appeared intact.   LABS:  ABGs on room air:  The pH 7.42, pCO2 35.1, pO2 67.8.  O2 sat 93.1%.  Other labs:  White count 11.3, hemoglobin 12.8, hematocrit 37, platelets  284,000.  Sodium 135, potassium 4.3, chloride 100, CO2 24, glucose 292, BUN  15, creatinine 1.0.  Total bilirubin 1.  Alkaline phosphatase 80.  SGOT 17.  SGPT 15.  Total protein 6.7.  Albumin 3.6.  Calcium 9.6.  Urinalysis:  Specific gravity 1.025, pH 5, urine glucose 100, bilirubin negative, ketone  negative, blood negative, protein 30 mg/dL, urobilinogen 0.2 mg/dL, nitrite negative, leukocyte negative, bacteria few.   IMPRESSION:  Primary  exacerbation of chronic bronchitis with bronchospasm.   SECONDARY DIAGNOSES:  1.  Hypertension.  2.  Type 2 diabetes mellitus.  3.  Irritable bowel syndrome.  4.  Osteoarthritis.  5.  Hyperlipidemia   PLAN:  IV fluids.  IV antibiotics.  Atenolol 50 mg p.o. daily.  Verapamil  120 mg p.o. daily.  Metaglip 5/500 p.o. every day with breakfast.  Metformin  500 mg p.o. every day with supper.  Maxzide 37.5/25 1  tablet p.o. every day.  Lovastatin 20 mg p.o. daily.  Lisinopril 10 mg p.o. daily.  Protonix 40 mg  p.o. daily.  Robitussin-DM 2 teaspoons p.o. q.4h. x72 hours, then q.6h.  Spiriva 1 capsule in autohaler daily.  Xopenex 0.63 mg per 3 mL q.6h. x24  hours, then q.8h.  Flovent metered dose inhaler 110 mcg 2 puffs q.12h.  Solu-  Medrol 125 mg IV q.6h. x4, then 80 mg IV q.6h. x4, then 60 mg IV q.6h. x4,  then 40 mg IV q.6h. x4.  Accu-Chek q.a.c. and q.h.s.  Hemoglobin A1c, sputum  for Gram's stain and culture.   ACTIVITY:  As tolerated.   Oxygen at 2 liters per minute via nasal cannula.   DIET:  A 2 g sodium diet, low cholesterol, carbohydrate-modified 1600  calorie.       GKH/MEDQ  D:  03/09/2005  T:  03/09/2005  Job:  [id number]

## 2011-02-03 NOTE — H&P (Signed)
[name], [name]              ACCOUNT NO.:  [id number]   MEDICAL RECORD NO.:  [id number]          PATIENT TYPE:  AMB   LOCATION:  DAY                           FACILITY:  [facility]   PHYSICIAN:  Leane Para C. Tamala Julian, M.D.   DATE OF BIRTH:  [birth year]   DATE OF ADMISSION:  DATE OF DISCHARGE:  LH                                HISTORY & PHYSICAL   This is a 60 year old female with known history of peptic ulcer disease who  has been off of her medications.  The patient has had episodic upper  abdominal pain radiating through to her breast.  She has also had pain and  nausea with liquids and solids.  She is scheduled for upper endoscopy.   PAST MEDICAL HISTORY:  1.  Diabetes mellitus.  2.  Hypertension.  3.  Osteoarthritis.  4.  Chronic bronchitis.  5.  Irritable bowel syndrome.   MEDICATIONS:  1.  Glipizide 5 mg b.i.d.  2.  Verapamil 120 mg, two every day.  3.  Metformin 500 mg b.i.d.  4.  Niravan 0.5 mg daily.  5.  Omeprazole 20 mg twice daily.  6.  She was recently started on Z-Pak, Robitussin, and Norrell SR.   ALLERGIES:  None.   FAMILY HISTORY:  Positive for stroke, diabetes mellitus.   SURGERY:  1.  Tonsillectomy.  2.  Uvulectomy.  3.  Adenoidectomy.  4.  Hysterectomy.  5.  Bilateral salpingo-oophorectomy.  6.  Cholecystectomy.   SOCIAL HISTORY:  She is married, disabled.  She smokes a pack of cigarettes  per day.   PHYSICAL EXAMINATION:  VITAL SIGNS:  Blood pressure 130/78, pulse 84,  respirations 20, weight 258.  HEENT:  Unremarkable.  NECK:  Supple.  No JVD, bruit, or adenopathy.  CHEST:  Clear to auscultation.  HEART:  Regular rate and rhythm without murmur, gallop, or rub.  ABDOMEN:  Soft, nontender.  No masses.  Normoactive bowel sounds.  EXTREMITIES:  No cyanosis, clubbing, or edema.  NEUROLOGIC:  No focal motor, sensory, or cerebellar deficit.   IMPRESSION:  1.  History of peptic ulcer disease with recurrent abdominal pain with      nausea post prandial,  dysphagia, suspected recurrent peptic ulcer      disease.  2.  Hypertension.  3.  Diabetes mellitus.  4.  Osteoarthritis.  5.  Chronic bronchitis.   PLAN:  Upper endoscopy.      Vernon Prey. Tamala Julian, M.D.  Electronically Signed     LCS/MEDQ  D:  07/12/2005  T:  07/12/2005  Job:  [id number]

## 2011-02-03 NOTE — Consult Note (Signed)
NAMEHENZLEY, LAFAUCI              ACCOUNT NO.:  [id number]   MEDICAL RECORD NO.:  [id number]          PATIENT TYPE:  AMB   LOCATION:                                FACILITY:  [facility]   PHYSICIAN:  R. Garfield Cornea, M.D. DATE OF BIRTH:  04/17/1951   DATE OF CONSULTATION:  DATE OF DISCHARGE:                                 CONSULTATION   REQUESTING PHYSICIAN:  Barrie Folk. Berdine Addison, M.D.   CHIEF COMPLAINT:  Epigastric pain.   HISTORY OF PRESENT ILLNESS:  Ms. Mccully is a 60 year old female who has  a history of recurrent peptic ulcer disease.  She tells me she has been  having epigastric and right upper quadrant pain which is very similar to  what she had prior to her being diagnosed with peptic ulcer disease.  She describes the pain as catch or cramp that begins in the  epigastrium or right upper quadrant.  It is generally worse with  movement.  She tells me her last EGD was in October 2006, by Dr. Tamala Julian.  She was found to have multiple gastric ulcers.  She has never had  hemorrhage or required transfusion.  She says her symptoms are worse at  nighttime.  She does have occasional heartburn indigestion.  She has  tried Nexium which does not seem to help.  She denies any nausea or  vomiting.  Denies any dysphagia or odynophagia.  Denies any melena or  rectal bleeding.   PAST MEDICAL HISTORY:  1. Diabetes mellitus.  2. Hypertension.  3. Chronic bronchitis.  4. Peptic ulcer disease.  5. Complete hysterectomy.  6. Ovarian carcinoma.  7. Cholecystectomy.  8. Right knee replacement.  9. Ganglion cystectomy from the left wrist.  10.Benign cyst removed from her anterior chest.  11.She did have a colonoscopy which showed internal hemorrhoids with      single anal papilla and sigmoid diverticula, otherwise normal      colonoscopy on July 24, 2006.   CURRENT MEDICATIONS:  1. Metformin 1 gram in the morning, 1 gram in the p.m.  2. Glipizide 5 mg in the a.m., 10 mg in the p.m.  3. Januvia  100 mg daily.  4. Verapamil 120 mg daily.  5. Triamterene/HCTZ 37.5 daily.  6. Atenolol 50 mg daily.  7. Lisinopril 10 mg daily.  8. TriCor 145 mg daily.  9. Fexofenadine 180 mg daily.  10.Aspirin 81 mg daily.   ALLERGIES:  NO KNOWN DRUG ALLERGIES.   FAMILY HISTORY:  There is no family history of colorectal carcinoma,  liver, or chronic GI problems.  Mother age 19, deceased secondary to  CVA.  Father age 38 deceased secondary to diabetes mellitus,  hypertension.  One daughter with MS.  One health brother.   SOCIAL HISTORY:  Ms. Woehrle is a widow.  She has 2 healthy children.  She is disabled.  She has a 40+ pack year history of tobacco use.  Denies any alcohol or drug use.   REVIEW OF SYSTEMS:  CONSTITUTIONAL:  Weight is stable.  Denies any fever  or chills.  CARDIOPULMONARY:  Denies any chest pain, palpitations  PULMONARY:  Denies any shortness of breath, dyspnea, cough, hemoptysis.  GI:  See HPI.  Denies any problems with constipation, diarrhea, or  hematochezia.   PHYSICAL EXAMINATION:  VITAL SIGNS:  Weight 268, height 65 inches, temp  99.1, blood pressure 138/90, and pulse 88.  GENERAL:  Ms. Andary is an obese African American female who is alert,  oriented, pleasant, and cooperative.  No acute distress.  HEENT:  Sclera clear, nonicteric.  Conjunctivae pink.  Oropharynx pink  and moist without any lesions.  NECK:  Supple without any masses or thyromegaly.  CHEST:  Heart regular rate and rhythm.  S1 S2 without murmurs, clicks,  rubs or gallops.  LUNGS:  Clear to auscultation bilaterally.  ABDOMEN:  Positive bowel sounds x4.  No bruits auscultated.  Soft.  She  does have mild tenderness at the epigastrium and right upper quadrant on  palpation.  She has tenderness with movement.  She also has tenderness  to the upper abdominal wall with lifting her head and legs off the exam  table.  There is no rebound tenderness or guarding.  No  hepatosplenomegaly or mass.   EXTREMITIES:  Without clubbing or edema bilaterally.  RECTAL:  Deferred.   IMPRESSION:  Ms. Darius is a 60 year old female with a history of  recurrent peptic ulcer disease who states she is having pain very  similar to when she had peptic ulcer disease.  She is also having some  heartburn and indigestion.  Her symptoms have not responded to Nexium.  Her symptoms are worse nocturnally.  She is quite tender on exam and I  suspect much of her epigastric and right upper quadrant pain is  abdominal wall pain.  However, she could have two problems and  therefore, we will pursue an EGD initially prior to treating her as she  would not be a candidate for NSAIDs, given her history of peptic ulcer  disease.   PLAN:  1. EGD with Dr. Gala Romney in the near future.  I discussed the procedure      including risks and benefits including, but not limited to,      bleeding, infection, perforation, drug reaction.  She agrees with      the plan, consent will be obtained.  2. Warm moist compresses q.i.d. to right upper quadrant and Tylenol      p.r.n. for pain.  3. Colonoscopy in 2012.   I would like to thank Dr. Berdine Addison for allowing [place] to participate in the  care of Ms. Villard.      Les Pou, N.P.      Bridgette Habermann, M.D.  Electronically Signed    KC/MEDQ  D:  10/29/2006  T:  10/29/2006  Job:  [id number]   cc:   Barrie Folk. Berdine Addison, MD  Fax: [phone]

## 2011-02-03 NOTE — Op Note (Signed)
NAMEBRITTANYE, [name]              ACCOUNT NO.:  [id number]   MEDICAL RECORD NO.:  [id number]          PATIENT TYPE:  AMB   LOCATION:  DAY                           FACILITY:  [facility]   PHYSICIAN:  R. Garfield Cornea, M.D. DATE OF BIRTH:  07-12-1951   DATE OF PROCEDURE:  07/24/2006  DATE OF DISCHARGE:                                 OPERATIVE REPORT   PROCEDURE:  High-risk screening colonoscopy.   INDICATIONS FOR PROCEDURE:  The patient is a 60 year old lady with no lower  GI tract symptoms, no prior lower GI tract imaging, sent over courtesy of  Dr. [name] for colorectal cancer screening.  Personal history  significant for ovarian cancer.  No history of colorectal neoplasia in her  family.  Colonoscopy is now being done as a high risk screening maneuver.  This approach has discussed the patient at length.  Potential risks,  benefits and alternatives have been reviewed, questions answered, she is  agreeable.  Please see documentation in the medical record.   PROCEDURE NOTE:  O2 saturation, blood pressure, pulse and respirations  monitored throughout the entire procedure.  Conscious sedation Demerol 125  mg IV Versed 5 mg IV in divided doses.   INSTRUMENT USED:  Olympus video chip system.   FINDINGS:  Digital rectal exam revealed no abnormalities.  Endoscopic findings:  The prep was good.  Examination of rectal mucosa  retroflexion view anal verge revealed a single anal papilla and internal  hemorrhoids, otherwise rectal mucosa appeared normal.  Colon:  Colonic mucosa was surveyed from rectosigmoid junction through the  left, transverse, right colon to area of appendiceal orifice, ileocecal  valve and cecum.  These structures well seen and photographed from the  record.  From this level scope was withdrawn.  All previously mentioned  mucosal surfaces were again seen.  The patient a long redundant colon which  required external abdominal pressure changing the patient's position to  reach the cecum.  The patient had few scattered left-sided diverticula.  Remainder of colonic mucosa appeared normal.  The patient tolerated the  procedure well, was reacted in endoscopy.   IMPRESSION:  Single anal papilla, internal hemorrhoids, otherwise normal  rectum, long redundant colon, few scattered sigmoid diverticula.  The  remainder of colon mucosa appeared normal.   RECOMMENDATIONS:  1. Diverticulosis literature provided to [name].  2. Repeat high risk screening colonoscopy 5 years.      [name], M.D.  Electronically Signed     RMR/MEDQ  D:  07/24/2006  T:  07/24/2006  Job:  [id number]

## 2011-02-03 NOTE — H&P (Signed)
NAME:  [name], Binkerd NO.:  [id number]   MEDICAL RECORD NO.:  [id number]                   PATIENT TYPE:   LOCATION:                                       FACILITY:   PHYSICIAN:  Vernon Prey. Tamala Julian, M.D.                DATE OF BIRTH:   DATE OF ADMISSION:  DATE OF DISCHARGE:                                HISTORY & PHYSICAL   A 60 year old female with a known peptic ulcer disease who has been treated  very aggressively since April.  She was hospitalized in April and underwent  endoscopy.  At that time, she had evidence of acute gastritis of the antrum  body and body of the stomach with very friable, thickened, edematous mucosa,  and she had multiple focal ulcers in the antrum and some acute serpiginous  ulcers of the antrum.  Biopsies of these ulcers were benign.  The patient  had a period of quiescence but has had recurrent episodes of epigastric pain  with pain in the right upper quadrant.  She is scheduled for repeat  colonoscopy.   PAST MEDICAL HISTORY:  1. Irritable bowel syndrome.  2. Depression.  3. Osteoarthritis.  4. Hypertension.  5. Diabetes mellitus.   MEDICATIONS:  1. Prilosec 20 mg every day.  2. Atenolol 50 mg every day.  3. __________  5/500 every day.  4. Verapamil 120 mg every day.  5. Metformin 500 mg every day.  6. Triamterene/hydrochlorothiazide 37.5/25 every day.  7. Hydrocodone 7.5/500 q.6h. p.r.n.  8. Lisinopril 10 mg every day.   SURGERY:  1. Tonsillectomy.  2. Hysterectomy.  3. Bilateral salpingo-oophorectomy.   HOSPITALIZATIONS:  1. Bronchitis.  2. Acute influenza.  3. Surgery.  4. Atypical chest pain.   FAMILY HISTORY:  Positive for stroke, diabetes mellitus, hypertension.   PHYSICAL EXAMINATION:  VITAL SIGNS:  Blood pressure 160/80, pulse 80,  respirations 20, weight 274 pounds.  HEENT:  Unremarkable.  NECK:  Supple.  No JVD or bruit.  CHEST:  Clear to auscultation.  HEART:  Regular rate and rhythm  without murmur, gallop or rub.  ABDOMEN:  Obese, soft, moderate midepigastric tenderness.  EXTREMITIES:  Bilateral crepitus of knees with tenderness with flexion and  extension with small effusion.  Bilateral lower extremity edema.  NEUROLOGIC:  No focal motor, sensory, or cerebellar deficit.   IMPRESSION:  1. Peptic ulcer disease with recurrent episodes of dyspepsia.  2. Osteoarthritis, moderately severe.  3. Irritable bowel syndrome.  4. Obesity.  5. Chronic bronchitis.   PLAN:  Upper endoscopy.     ___________________________________________                                         Vernon Prey. Tamala Julian, M.D.   LCS/MEDQ  D:  05/05/2004  T:  05/05/2004  Job:  [id number]

## 2011-02-03 NOTE — Op Note (Signed)
NAMEVIAN, [name]              ACCOUNT NO.:  [id number]   MEDICAL RECORD NO.:  [id number]          PATIENT TYPE:  AMB   LOCATION:  DAY                           FACILITY:  [facility]   PHYSICIAN:  R. Garfield Cornea, M.D. DATE OF BIRTH:  1950/10/01   DATE OF PROCEDURE:  11/07/2006  DATE OF DISCHARGE:                               OPERATIVE REPORT   PROCEDURE:  Esophagogastroduodenoscopy and biopsy.   INDICATIONS FOR PROCEDURE:  60 year old lady history of recurrent peptic  ulcer disease having dyspeptic symptoms now with heartburn and  indigestion.  She had poor response to Nexium.  She is not really having  any dysphagia or odynophagia.  EGD now being done.  This approach has  been discussed with the patient at length.  Potential risks, benefits  and alternatives have been reviewed, questions answered.  She is  agreeable. Please see documentation in the medical record.   PROCEDURE NOTE:  O2 saturation, blood pressure, pulse and respirations  monitored throughout the entire procedure.   CONSCIOUS SEDATION:  Versed 5 mg IV, Demerol 100 grams IV divided doses.  Cetacaine spray topical pharyngeal anesthesia.   INSTRUMENT:  Pentax video chip system.   FINDINGS:  Examination of the tubular esophagus revealed a two quadrant  inverted V shaped distal esophageal erosions, patulous EG junction.  EG  junction was easily traversed.  Stomach:  Gastric cavity was empty and insufflated well with air.  Thorough examination of gastric including retroflexion view proximal  stomach esophagogastric junction demonstrated hiatal hernia and multiple  antral erosions on folds.  Please see photos.  There was no infiltrating  process or peptic ulcer disease.  Pylorus patent, easily traversed.  Examination of the bulb, second portion revealed multiple erosions.   THERAPEUTIC/DIAGNOSTIC MANEUVERS PERFORMED:  Biopsies of the antrum were  taken for histologic study.  The patient tolerated the procedure well,  was reacted in endoscopy.   IMPRESSION:  Significant distal esophageal erosions consistent with  erosive reflux esophagitis, patulous EG junction, hiatal hernia, antral  D1 and D2 erosions.  Otherwise normal stomach D1 and D2.  Status post  antral biopsies.   RECOMMENDATIONS:  The patient needs to be treated aggressively for  gastroesophageal reflux disease, will have her start AcipHex 20 mg  orally b.i.d.  She is to go by my office for samples.  Antireflux  literature provided [name].  Follow-up on path.  Appointment to see  [place] back in the office in 1 month.      Bridgette Habermann, M.D.  Electronically Signed     RMR/MEDQ  D:  11/07/2006  T:  11/07/2006  Job:  [id number]   cc:   Barrie Folk. Berdine Addison, MD  Fax: [phone]

## 2011-02-03 NOTE — H&P (Signed)
[name], [name]                          ACCOUNT NO.:  [id number]   MEDICAL RECORD NO.:  [id number]                   PATIENT TYPE:   LOCATION:                                       FACILITY:   PHYSICIAN:  Carole Civil, M.D.           DATE OF BIRTH:  1951/08/01   DATE OF ADMISSION:  DATE OF DISCHARGE:                                HISTORY & PHYSICAL   PREOPERATIVE HISTORY AND PHYSICAL   CHIEF COMPLAINT:  Right knee pain.   HISTORY OF PRESENT ILLNESS:  This is a 60 year old female with history of  peptic ulcer disease due to anti-inflammatories. She has had severe and  increasing right knee pain for several years now. She is status post left  knee arthroscopy. Her right knee pain is associated with a severe valgus  knee and she is currently ambulating with a cane. Her pain is constant,  severe, worsened by ambulation.   REVIEW OF SYSTEMS:  History of degenerative disk disease with some radicular  pain at times, currently under control. Difficulty breathing. Peptic ulcer  disease. Joint pain, arthritis, and diabetes.   ALLERGIES:  None.   PAST MEDICAL HISTORY:  Hypertension, diabetes, peptic ulcer disease, and  arthritis.   PAST SURGICAL HISTORY:  Cholecystectomy, hysterectomy, cancerous tumor  excised.   PHARMACY:  [facility].   MEDICATIONS:  Atenolol, Altace, Verapamil, Metformin, Triamterene,  hydrochlorothiazide, Sucralfate, Lovastatin, aspirin, Prilosec.   FAMILY HISTORY:  Arthritis.   SOCIAL HISTORY:  She is widowed. A smoker, 2 packs per day. Alcohol use  none. Caffeine use __________. She lives at home with her 44 year old son.   PHYSICAL EXAMINATION:  VITAL SIGNS:  Weight is 272. Pulse 82, respiratory  rate 20.  GENERAL:  She is obese, well developed, well nourished. Grooming and hygiene  are normal. She has a deformity of the right knee with valgus alignment.  CARDIOVASCULAR:  She has some mild varicosities. Minimal swelling. Minimal  edema and  tenderness. Normal pulses and temperature.  NECK:  Nodes are negative in the popliteal space.  NEUROLOGIC:  Gait and station are antalgic with a right valgus knee. Her  right lower extremity is tender laterally and anteriorly. There is crepitus  on range of motion. The range of motion passively is 0 to 115, limited by  leg morphology. Stability is normal. Strength is good including extensor  mechanism.  SKIN:  Normal.  NEUROLOGIC:  Neurologic and psych examination shows normal coordination  reflexes, sensation. She is alert and oriented x3. Mood and affect are  normal.   LABORATORY DATA:  Her radiographs show a valgus knee with some patella  femoral arthrosis,   IMPRESSION:  Osteoarthritis right knee.   PLAN:  Right total knee replacement.     ___________________________________________  Carole Civil, M.D.   SEH/MEDQ  D:  05/16/2004  T:  05/16/2004  Job:  [id number]

## 2011-02-03 NOTE — Discharge Summary (Signed)
[name], [name]                        ACCOUNT NO.:  [id number]   MEDICAL RECORD NO.:  [id number]                   PATIENT TYPE:  INP   LOCATION:  A340                                 FACILITY:  [facility]   PHYSICIAN:  Barrie Folk. Berdine Addison, M.D.                DATE OF BIRTH:  22-Sep-1950   DATE OF ADMISSION:  10/21/2002  DATE OF DISCHARGE:  10/24/2002                                 DISCHARGE SUMMARY   HISTORY OF PRESENT ILLNESS:  The patient was a 60 year old, separated, G2,  P2, AB0, black female from [place], [place].  Her chief complaints were general  malaise, cough, chest pain, and right back pain.  Symptoms had been present  for four days.  Cough was described as productive and frequent.  Cough  exacerbated the chest pain.  Sputum via cough was described as yellow.  The  patient is also complaining of hoarseness, chest tightness, wheezing,  shortness of breath, nasal congestion, and headache.   PAST MEDICAL HISTORY:  Positive for hypertension, type 2 diabetes mellitus,  osteoporosis, osteoarthritis, and chronic tobacco use.  Positive for  hospitalization for abuse bronchitis with hypoxemia in June 2001, at  [facility]; acute viral influenza with hypoxemia in December 2003;  tonsillectomy, uvulectomy, adenoid surgery in October 1993, at [facility]; bilateral salpingo-oophorectomy in October 1986,  secondary to salpingo-oophoritis with hemorrhagic corpus luteum of the left  ovary; cholecystectomy; hysterectomy secondary to cervical cancer; and  hospitalization with chest pain and shortness of breath secondary to  paroxysmal atrial flutter in November 1997.   ALLERGIES:  The patient is not allergic to any known medication.   HABITS:  Positive particularly for smoking.   FAMILY HISTORY:  Mother deceased at age 73 secondary to complication of  stroke; father deceased at age 37 secondary to complications of diabetes  mellitus.  One brother living at 55, still  in good health.  One daughter  living at the age of 62 in good health.  One son living at the age of 62 in  good health.   PROBLEM LIST:  Problem #1:  Acute bronchitis.  Vitals in the office revealed  the following:  Temperature 97.8, pulse 86, respirations 22, blood pressure  107/90.  Vitals on the floor on admission revealed temperature 98.0, blood  pressure 186/91, pulse 76, respirations 24, O2 saturations 95% on room air.  Eyes demonstrate no discharge.  Nose was negative for discharge.  Posterior  pharynx was benign.  Lungs revealed fair air movement without distinct  wheezes.  Heart had audible S1 and S2 without murmur, rub, or gallop.  Rhythm was regular, and rate within normal limits.  Abdomen was obese and  positive for old healed mid multiple surgical scars.  Abdomen was soft and  nontender in all four quadrants.  Abdominal exam demonstrated no palpable  masses or organomegaly.  The patient was neurologically intact.   Chest  x-ray on October 21, 2002, demonstrated mild cardiac prominence and  chronic bronchitic changes, read by Dr. Lavonia Dana.  An EKG on admission  demonstrated at normal sinus rhythm, ventricular rate of 78, normal axis, no  significant Q-waves, ST elevation or depression.   Significant labs on admission were as follows:  White count 8.5, hemoglobin  11.0, hematocrit 32.5, platelets 265,000.  Sodium 135, potassium 3.7,  chloride 106, CO2 26, glucose 114, BUN 17, creatinine 1.1, calcium 9.0.  Hemoglobin A1c is 7.9.  Total CPK 86, CK-MB 0.9, Troponin-I 0.01.   The course in the hospital was uneventful.  She was treated with IV fluids,  Flumadine 100 mg p.o. b.i.d., Zithromax dose pack, Combivent metered dose  inhaler two puffs q.i.d., antitussive medications, and other supportive  measures.  The patient experienced significant decreased frequency of cough.  Hoarseness was resolved at the time of discharge.  She had no complaint of  general malaise, chest pain,  fever, or chills.  Appetite had improved.  She  was alert and oriented to person, place, and time.  She was discharged to  home on October 24, 2002.   Problem #2:  Type 2 diabetes mellitus.  Serum glucose on admission was 114.  The patient was treated with diabetic diet and Glucophage XR 500 mg p.o.  daily.  Fasting blood sugar on the morning of discharge was 134.  The  patient had no complaint of dizziness, diaphoretic spells, nausea, or  vomiting.   Problem #3:  Hypertension.  Blood pressure on admission was 107/90 in the  office; however, blood pressure on admission to the floor was 186/91.  The  patient's lung were clear on admission.  Heart exam was within normal  limits.  The patient was treated with verapamil 240 mg p.o. daily, clonidine  0.2 mg p.o. t.i.d., Maxzide 25 p.o. daily, and other supportive measures.  The patient had no complaint of chest pain, palpitations, or edema at the  time of discharge.  Blood pressure on the morning of discharge with a large  cuff was 150/90.  Adjustment had been made in blood pressure.   Problem #4:  Osteoarthritis.  The patient was treated with Cataflam 50 mg  p.o. t.i.d. with meals.  Examination of the knees demonstrated bilateral  stiffness and tenderness with flexion and extension.  The patient had no  swelling, redness, or hotness of the joints.   Problem #5:  Osteoporosis.  The patient was treated with calcium 1500 mg  p.o. daily.   Problem #6:  Irritable bowel syndrome.  The patient had no complaint of  diarrhea, constipation, abdominal cramping, abdominal bloating, or  hyperactive bowel sounds during this hospitalization.  The plan is to  observe.   Problem #7:  Intertrigo.  Examination of the inferior aspect of the breast  on admission demonstrated confluent redness and maceration.  The patient was  treated with Lotrisone cream daily during this hospitalization.  Moreover, she was encouraged not to wear a bra while in the  hospital.  The patient  experienced redness under the breast at the time of discharge.  Moreover,  the maceration was almost gone.  The patient was advised to continue  Lotrisone cream on the breast daily as an outpatient.   DIET:  Low sodium 1600 ADA.   ACTIVITY:  Increase slowly.   DISCHARGE MEDICATIONS:  1. Flumadine 100 mg p.o. b.i.d. x4 days.  2. Zithromax 250 mg p.o. daily x2.  3. Glucophage XR 500 mg one tablet p.o.  daily with supper.  4. Verapamil SR 240 mg one tablet every morning and 1/2 tablet every     evening.  5. Premarin 0.3 mg one tablet p.o. daily.  6. Cataflam 50 one tablet p.o. q.8 h.  7. Aspirin 325 mg one p.o. daily.  8. Tessalon Perles 100 mg p.o. q.8 h.  9. Flovent metered dose inhaler, two puffs q.12 h.  10.      Albuterol metered dose inhaler, two puffs q.4-6 h. p.r.n. for     wheeze and shortness of breath.  11.      Lotrisone cream to area on the breast daily.  12.      Robitussin AC two teaspoons p.o. q.4-6 h. for cough.  13.      Maxzide 25 one tablet p.o. daily.   FOLLOWUP:  With Dr. Berdine Addison in one week.   FINAL PRIMARY DIAGNOSIS:  Acute bronchitis.   SECONDARY DIAGNOSES:  1. Intertrigo, inferior aspect of breast.  2. Hypertension.  3. Osteoarthritis.  4. Osteoporosis.  5. Irritable bowel syndrome.  6. Type II diabetes mellitus.                                               Barrie Folk. Berdine Addison, M.D.    Armanda Heritage  D:  10/24/2002  T:  10/25/2002  Job:  [id number]

## 2011-02-03 NOTE — Group Therapy Note (Signed)
[name], [name]                        ACCOUNT NO.:  [id number]   MEDICAL RECORD NO.:  [id number]                   PATIENT TYPE:  INP   LOCATION:  [id number]                                 FACILITY:  [facility]   PHYSICIAN:  Carole Civil, M.D.           DATE OF BIRTH:  Apr 12, 1951   DATE OF PROCEDURE:  06/04/2004  DATE OF DISCHARGE:                                   PROGRESS NOTE   PROBLEM LIST:  Status post total knee replacement, right knee.   SUBJECTIVE:  On postop day #1, she is in stable condition with vital signs  stable.  Pain is at a level of 4.  Hemoglobin is 11 and started out at 13.   PLAN:  Recommend continuing postoperative care as required by protocol.  Just pain medications as needed.      SEH/MEDQ  D:  06/04/2004  T:  06/04/2004  Job:  [id number]

## 2011-02-03 NOTE — Consult Note (Signed)
[name], [name]                        ACCOUNT NO.:  [id number]   MEDICAL RECORD NO.:  [id number]                   PATIENT TYPE:  INP   LOCATION:  [id number]                                 FACILITY:  [facility]   PHYSICIAN:  Barrie Folk. Berdine Addison, M.D.                DATE OF BIRTH:  08/26/1951   DATE OF CONSULTATION:  DATE OF DISCHARGE:                                   CONSULTATION   HISTORY OF PRESENT ILLNESS:  The patient is a 60 year old single, gravida 2,  para 2, AB 0 disabled black female from [place], [place].  The patient  was admitted by Dr. Arther Abbott, orthopedist, for elective total right  knee replacement.  History is positive for chronic pain and stiffness of  both knees x 45 years.  The right knee has been progressively worse than the  left knee.  The patient's quality of life has been affected due to increase  of pain secondary to weightbearing on her right knee.  The patient is on  disability because of the severity of her arthritis disease.  Moreover, she  is not able to take nonsteroidal antiinflammatory agents due to proven  peptic ulcer disease by EGD.  Also, steroid injections have not been a means  to treat her arthritic knee due to the severity of it.   PAST MEDICAL HISTORY:  Positive for hypertension, type 2 diabetes mellitus,  chronic bronchitis, hyperlipidemia, and osteoporosis.   PAST MEDICAL HISTORY:  Negative for tuberculosis, sickle cell, asthma and  seizure disorder.   ALLERGIES:  The patient is not allergic to any known medication.   MEDICATIONS:  Prescribed medications on admission:  1.  Atenolol 50 mg p.o. daily.  2.  Verapamil 120 mg p.o. daily.  3.  Metformin 500 mg p.o. every day with super.  4.  Maxzide one tablet p.o. daily.  5.  Lovastatin 20 mg p.o. daily.  6.  Lisinopril 10 mg p.o. daily.  7.  Prevacid 30 mg, one capsule p.o. every day.  8.  Metaglip (5/500), one tablet p.o. every day with breakfast.  9.  Levsin 0.125 mg sublingual  a.c. and bedtime.  10. Hydrocodone 5/500, one tablet p.o. q.6-8h p.r.n. for pain.   HABITS:  Positive for cigarette smoking.  Habits are negative for the use of  ethanol and street drugs.   FAMILY HISTORY:  Mother deceased at age 74 secondary to complications of  stroke.  Father deceased at age 77 secondary to complications of diabetes  mellitus.  One brother is living at age 81 in good health.  One daughter is  living at age 62 in good health.  One son is living at age 62 in good  health.   PAST MEDICAL HISTORY:  Positive for hospitalization for acute bronchitis  with hypoxemia in June of 2001 at [facility], acute viral influenza  with hypoxemia in December of 1993, tonsillectomy, uvulectomy and adenoid  surgery in October of 1993 at [facility].  Bilateral  salpingo-oophorectomy in October of 1986 secondary to salpingo-oophoritis  with hemorrhagic corpus luteum of left ovary, cholecystectomy, hysterectomy  secondary to cervical cancer, chest pain and shortness of breath secondary  to paroxysmal atrial flutter in November of 1997, acute bronchitis in  February of 2004, and atypical chest pain with peptic ulcer disease in April  of 2005.   REVIEW OF SYSTEMS:  Positive for chronic pain of multiple joints, episodic  wheezing, episodic shortness of breath, episodic productive cough, poor  ambulatory status, episodic lower abdominal cramping, episodic bloating of  stomach with meals, hyperactive bowel sounds and episodic watery stools  after meals.   REVIEW OF SYSTEMS:  Negative for hemoptysis, hematemesis, melena, gross  hematuria, dysuria, vaginal itching, syncope, dizziness, weight loss, etc.   REVIEW OF SYSTEMS:  Also positive for night sweats.   PHYSICAL EXAMINATION:  VITAL SIGNS:  Temperature 97.6, pulse 76,  respirations 20, blood pressure 126/78.  GENERAL:  A middle-aged, overweight, medium-height, alert, black female in  no apparent respiratory  distress.  HEENT:  Normocephalic.  Ears, normal auricle.  External canal patent.  Tympanic membrane pearly gray.  Eye lids negative for ptosis.  Sclerae are  white.  Pupils are round, equal, and reactive to light.  Extraocular  movements intact.  Nose negative for discharge.  Mouth, positive upper and  lower dentures. No mucosal lesions.  Posterior pharynx is benign.  NECK:  Negative for lymphadenopathy or thyromegaly.  LUNGS:  Clear.  HEART:  Audible S1 and S2 without murmur.  Regular rate and rhythm.  BREASTS:  Large, no skin changes.  No nodule on palpation.  Nipples erect  without discharge.  ABDOMEN: Obese, hyperactive bowel sounds.  Positive for healed, old right  upper-quadrant surgical scar.  Soft, nontender, in the epigastric and  hypogastrium.  No palpable masses. No organomegaly.  GU:  External genitalia, normal female.  Rectal is deferred.  EXTREMITIES:  Lower right positive for brace, positive for Hemovac drain.  (drainage color bloody).  Left knee negative for swelling, redness or  hotness.  Positive for stiffness and crepitance with flexion and extension.  Palpable dorsalis pedis bilaterally.  NEUROLOGIC:  Alert and oriented to person, place and time.  Cranial nerves  II-XII appeared intact.   LABORATORY DATA:  White count 9.0, hemoglobin 13.6, hematocrit 39.7,  platelets 299,000.  Sodium 134, potassium 4.0, chloride 104, CO2 25, sodium  135.  BUN 17, creatinine 1.0.   IMPRESSION:  Primary:  status post acute right knee replacement secondary to  severe degenerative joint disease.   SECONDARY DIAGNOSES:  1.  Hypertension.  2.  Osteoporosis.  3.  Type 2 diabetes mellitus.  4.  Hyperlipidemia.  5.  Chronic bronchitis.   PLAN:  1.  Diet:  Carbohydrate-modified 1600 calories, 4 grams of sodium, low      cholesterol.  2.  Activity per orthopedist.   MEDICATIONS:  1.  Atenolol 50 mg p.o. daily. 2.  Verapamil 120 mg p.o. daily.  3.  Metformin 500 mg p.o. every day  with super.  4.  Maxzide 25, one tablet p.o. every day.  5.  Lovastatin 20 mg p.o. daily.  6.  Lisinopril 10 mg p.o. daily.  7.  Prevacid 30 mg p.o. daily.  8.  Metaglip 5/500, one tablet p.o. every day with breakfast.  9.  Levsin 0.125 mg sublingual a.c. and bedtime.  10. Lovenox 30 mg subcu b.i.d. started on June 04, 2004.  11. Dulcolax suppositories daily until bowel movement.  12. Colace 100 mg p.o. b.i.d.  13. Tylenol 500 mg p.o. q.6h. p.r.n. for pain or fever 101 or greater.  14. Skelaxin 800 mg p.o. q.8h.  15. Lorcet Plus, one tablet p.o. q.6h. p.r.n. for pain.  16. Florone to left lower extremities.   DISCHARGE INSTRUCTIONS:  1.  Case worker consultation.  2.  Accu-Chek 0700, 1600 and 2200 daily.  3.  Hemoglobin A1c.  4.  Xopenex 1.25 mg/ Atrovent nebulization treatment q.6h.  5.  O2 saturations q.6h. x72 hours.  6.  Oxygen at two liters per minute via nasal cannula.      GKH/MEDQ  D:  06/03/2004  T:  06/04/2004  Job:  [id number]

## 2011-02-03 NOTE — Discharge Summary (Signed)
NAMECATON, WOHLWEND                        ACCOUNT NO.:  [id number]   MEDICAL RECORD NO.:  [id number]                   PATIENT TYPE:  INP   LOCATION:  A311                                 FACILITY:  [facility]   PHYSICIAN:  Barrie Folk. Berdine Addison, M.D.                DATE OF BIRTH:  December 18, 1950   DATE OF ADMISSION:  01/04/2004  DATE OF DISCHARGE:  01/08/2004                                 DISCHARGE SUMMARY   HOSPITAL COURSE:  The patient was a 60 year old single gravida 2 para 2 AB 0  black female from [place], [place].  Chief complaint was severe,  progressive, persistent right chest pain.  Pain had been present x2 weeks.  The pain was described as stabbing pressure.  The pain occurred secondary to  bending rightward diagonally forward.  She denied trauma to chest wall,  fall, chest wall lesion.  The patient was sleeping on one pillow despite  chest discomfort.  Moreover, she denies shortness of breath.  Medical  history positive for hypertension, type 2 diabetes mellitus, osteoporosis,  and osteoarthritis.  Past medical history positive for hospitalization for  acute bronchitis with hypoxemia in June 2001 at [facility]; acute  viral influenza with hypoxemia in December 1993; tonsillectomy; uvulectomy  and adenoid surgery in October 1993; bilateral salpingo-oophorectomy in  October 1986 secondary to salpingo-oophoritis with hemorrhage corpus luteum  of left ovary; cholecystectomy; hysterectomy secondary to cervical cancer;  chest pain and shortness of breath secondary to paroxysmal atrial flutter in  November 1997; acute bronchitis in February 2004.  The patient was not  allergic to any known medication.  Family history revealed mother deceased  age 16 secondary to complications of stroke; father deceased age 83  secondary to complications of diabetes mellitus; one brother living age 38,  good health; one daughter living age 79, good health and one son living, age  49, good  health.   #1 - ATYPICAL RIGHT CHEST WALL PAIN.  General appearance revealed a middle-  aged, overweight, medium-height, alert black female who appeared not to feel  well but in no apparent respiratory distress.  Vital signs on admission were  as follows:  Temperature 98.1, pulse 73, respirations 22, blood pressure  176/86.  Skin was warm and dry.  Sclerae were white.  Nose negative for  discharge.  Posterior pharynx was benign.  Posterior pharynx was positive  for absent uvula.  The patient had upper and lower dentures.  Lungs were  negative for wheezes, rales, rhonchi.  The patient was moving air  bilaterally on auscultation.  Heart revealed audible S1 and S2 without  murmur.  Rhythm was regular and rate within normal limits.  A right anterior  lower rib with positive tenderness on palpation.  Right posterior area  beneath right scapula area was positive tenderness on palpation with bending  diagonally right forward.  Abdomen was obese and positive for mid epigastric  tenderness,  old healed right upper quadrant surgical scar, and no palpable  masses.  The patient was neurologically intact.  Significant labs on  admission were as follows:  Erythrocyte sedimentation rate 7, D-dimer 0.33  mcg/mL (normal 0-0.48), sodium 136, potassium 4.1, chloride 101, CO2 28,  glucose 171, BUN 21, creatinine 1.0, total bilirubin 0.7, alkaline  phosphatase 66, SGOT 9, SGPT 19, total protein 6.2, albumin 3.6, calcium  9.2, total CPK 98, CK-MB 1.5, troponin I less than 0.01, white count 8.9,  hemoglobin 13.5, hematocrit 40.3, platelets 306.  The patient was admitted  for workup of atypical chest pain.  She was treated with IV fluids,  initially Toradol 30 mg IV q.6h., Pepcid 20 mg IV q.12h., regular diabetic  diet, Solu-Medrol IV q.6h. x24 hours, and other supportive measures.  Chest  x-ray on admission was read as chronic bronchitic changes, cardiomegaly  without congestive heart failure.  Flat of upper  abdomen demonstrated normal  bowel gas pattern, possible right ureteral calculus as read by Dr. Maryclare Bean.  CT of the chest with contrast on January 05, 2004 was read as negative CT of  the chest; no mass or adenopathy; and probable incidental left adrenal  adenoma as read by Dr. Alfred Levins.  Lung field demonstrated no suspicious  lung lesion.  The patient experienced decreased significant right chest wall  discomfort.  However, epigastric pain increased in severity during this  hospitalization.  Therefore, the patient was scheduled for a diagnostic EGD  by Dr. Mohammed Kindle during this hospitalization.  EGD was performed by Dr. Mohammed Kindle on April 21, [id number] without complication.  The EGD demonstrated severe  gastroduodenitis.  The patient also had multiple gastric and linear ulcers  and extensive antral erosion.  Multiple biopsies were done.  The patient was  taken off Toradol and all nonsteroidal antiinflammatory agents.  She was put  on Carafate and continued on Pepcid 20 mg q.12h. p.o.  Moreover, H. pylori  antigen was ordered.  This will be followed up and acted upon accordingly.  The patient had no complaint of nausea, vomiting, melena, or hematochezia at  time of discharge.  She was alert and oriented to person, place, and time.  She felt significantly better compared to admission.  She still had some  epigastric and right lower costal margin pain.  She was advised to avoid all  aspirin-like products.  She will be treated for 8 weeks with Carafate and  proton inhibitor.  She will have a repeat diagnostic EGD in approximately 8  weeks.  Moreover, she was also advised to avoid spicy and greasy foods.   #2 - HYPERTENSION.  Blood pressure on admission was 176/86.  The patient was  treated with low sodium diet, antihypertensive medication preadmission.  She  was continued on Altace 2.5 mg p.o. daily, atenolol 50 mg p.o. daily, and verapamil 120 mg p.o. daily.  Her blood pressure on the morning of  discharge  was elevated 182/87.  The importance of complying to diet and salt  restriction was discussed with the patient.  Dietician did see the patient  during this hospitalization.  Blood pressure will be monitored closely as an  outpatient.  Chest x-ray did demonstrate a mildly enlarged heart.  The  patient was not complaining of palpitation, shortness of breath, or edema of  legs at the time of discharge.   #3 - CHRONIC BRONCHITIS SECONDARY TO SMOKING.  The patient was not allowed  to smoke during this hospitalization.  Chest  x-ray showed chronic bronchitic  changes and she was advised to stop smoking.  Moreover, chest x-ray  demonstrated no infiltrate along with negative CT of the chest.  She was not  complaining of shortness of breath at time of discharge.  However, she did  present with a nagging, dry cough most of the time.  Cough appeared to  respond to antitussive medication.   #4 - OSTEOARTHRITIS.  Examination on knees demonstrated bilateral stiffness  and tender to flexion and extension.  The patient also has a history of  shoulder pain.  She will be treated with analgesic that contains no aspirin-  like products.  She has been advised to stop aspirin-like products because  of the development of her gastric ulcers.   #5 - OSTEOPOROSIS.  The patient will be continued on calcium supplement.   #6 - TYPE 2 DIABETES MELLITUS.  Serum glucose on admission was 171.  Hemoglobin A1c was 8.2.  Dietician saw the patient during this  hospitalization.  The patient experienced elevated glucose because of use of  her steroids.  Glucose began to show some decline at the end of her  hospitalization.  The patient was treated with Humulin R sliding scale and  metformin initially.  The patient's glucose will be monitored closely as an  outpatient.  The importance of compliance to diet and weight loss were  discussed with the patient also.   #7 - ACUTE URINARY TRACT INFECTION.  Urinalysis on  admission demonstrated  the following:  Specific gravity 1.020, pH 6.5, no glucose, no hemoglobin,  no bilirubin, no ketones, no protein, 7-10 wbc's, many bacteria.  A urine  culture did grow Proteus that was sensitive to Levaquin.  The patient was  discharged on Levaquin at time of discharge.   #8 - HYPERLIPIDEMIA.  Fasting lipid profile during this hospitalization  demonstrated the following:  On January 05, 2004 at 0425 total cholesterol  295, triglyceride 94, HDL 50 mg/dl, LDL 226 mg/dl.  The patient was treated  with low cholesterol diet in addition to calorie restriction.  She was also  treated with Zocor 40 mg p.o. every bedtime.  Liver function tests during  this hospitalization demonstrated the following:  Total protein 6.2, albumin  3.6, AST 9, ALT 19, ALP 66, total bilirubin 0.7.  Liver function tests will  be repeated yearly.  Lipid profile will be repeated every 6 months.  The patient was not complaining of any muscle pain at time of discharge.   The patient was discharged home on January 08, 2004.  Instructions at the time  of discharge were as follows:  1. Carbohydrate-modified 1600 calorie low salt and cholesterol.  2. Medications:     a. Calcium carbonate 500 mg one tablet twice a day.     b. Verapamil 120 mg p.o. daily.     c. Ramipril 2.5 mg p.o. daily.     d. Triamterene/hydrochlorothiazide 37.5/25 one tablet p.o. every day.     e. Atenolol 50 mg one tablet p.o. every day.     f. Metformin ER 500 mg one tablet every day with breakfast.     g. Omeprazole 40 mg one tablet p.o. every day.     h. Lortab 5/500 one tablet p.o. q.6-8h. as needed for pain.        a. Cipro 500 mg p.o. b.i.d. x7 days.  3. All aspirin-like products to be held for approximately 4 weeks.  4. Follow up with Dr. Berdine Addison x1 week.  FINAL PRIMARY DIAGNOSES:  1. Atypical chest pain secondary to acute severe gastroduodenitis and     multiple gastric and linear ulcers and extensive antral erosion.  2.  Acute urinary tract infection.   SECONDARY DIAGNOSES:  1. Chronic bronchitis.  2. Hypertension.  3. Hyperlipidemia.  4. Osteoporosis.  5. Osteoarthritis.     ___________________________________________                                         Barrie Folk. Berdine Addison, M.D.   Armanda Heritage  D:  01/09/2004  T:  01/09/2004  Job:  [id number]

## 2011-02-03 NOTE — H&P (Signed)
NAMESURBHI, SCHOENBAUER                        ACCOUNT NO.:  [id number]   MEDICAL RECORD NO.:  [id number]                   PATIENT TYPE:  INP   LOCATION:  A340                                 FACILITY:  [facility]   PHYSICIAN:  Barrie Folk. Berdine Addison, M.D.                DATE OF BIRTH:  08-15-1951   DATE OF ADMISSION:  10/21/2002  DATE OF DISCHARGE:                                HISTORY & PHYSICAL   IDENTIFYING DATA:  The patient is a 60 year old separated, gravida 2, para  2, AB 0 black female from [place], [place].   CHIEF COMPLAINT:  General malaise, cough, chest pain and right back pain.   HISTORY OF PRESENT ILLNESS:  Patient has been experiencing symptoms x4 days.  Cough is described as productive and frequent.  Cough exacerbates chest  pain. Sputum via cough is described as yellow. Symptoms also positive for  hoarseness, chest tightness, wheezing, shortness of breath, nasal congestion  and headache.  The patient denied nausea, vomiting, diarrhea, hemoptysis,  TIA or GIA.   PAST MEDICAL HISTORY:  Positive for hypertension, type 2 diabetes mellitus,  osteoporosis, osteoarthritis and chronic tobacco use.  Positive  hospitalization for acute bronchitis with hypoxemia in 6/01 at [facility]. Acute viral influenza with hypoxemia in 12/93.  Tonsillectomy.  Uvulectomy.  Adenoid surgery 10/93 at [facility].  Bilateral  salpingo-oophorectomy in 10/86 secondary to salpingo-oophoritis with  hemorrhagic corpus luteum of left ovary.  Cholecystectomy.  Hysterectomy  secondary to cervical cancer.  In-hospitalization for chest pain and  shortness of breath secondary to paroxysmal atrial flutter in 11/97.   MEDICATIONS ON ADMISSION:  1. Glucophage XR 500 mg every day p.o. before supper.  2. Clonidine 0.2 mg p.o. q.i.d.  3. Maxzide 25 mg p.o. every day.  4. Verapamil 240 mg p.o. every day.  5. Premarin 0.625 mg p.o. every day.  6. Cataflam 50 mg p.o. t.i.d. with  meals.  7. Atenolol 50 mg p.o. every day.  8. Albuterol metered-dose inhaler, 2 puffs q.4-6h. p.r.n. wheezing.  9. Slovent metered-dose inhaler, 1-10 micrograms puffs q.12h.   ALLERGIES:  NO MEDICATIONS.   FAMILY HISTORY:  Mother deceased age 1 secondary to complications of  stroke, Father deceased age 42 secondary to complications of diabetes  mellitus.  Brother living age 72, good health. One daughter living age 93,  good health, and 1 son living age 10, good health.   REVIEW OF SYSTEMS:  Positive for chronic bilateral knees pain and stiffness,  episodic wheezing, decreased exercise endurance, episodic diarrhea, episodic  swelling of larynx.  Negative for hemoptysis, hematemesis, epistaxis,  bleeding gums, gross hematuria, dysuria, melena, weight loss, lumps in  breast, discharge from breasts, etc.   PHYSICAL EXAMINATION:  VITAL SIGNS IN THE OFFICE:  Temperature 97.8, pulse  86, respirations 22, blood pressure 107/90.  GENERAL:  A middle aged, overweight, medium-height, alert black female who  appeared not  to feel well, coughing frequently.  SKIN:  Warm and dry.  HEENT:  Normocephalic.  Ears:  Normal auricles, external canal with cerumen,  tympanic membranes pearly gray.  Eyes:  Negative, sclerae white, pupils are  equal, round and reactive to light, extraocular movements intact.  Nose:  Positive nasal mucosa edema, no active nasal discharge.  Mouth:  Positive  upper and lower dentures but no oral lesions, posterior pharynx benign.  NECK:  Negative for lymphadenopathy or thyromegaly.  LUNGS:  Fair air movement, no distinct wheezes.  CARDIOVASCULAR:  Audible S1/S2, no murmurs, rubs or gallops.  Regular rate and rhythm.  BREASTS:  No skin changes, nipple or areolar discharge.  Inferior aspect of  breast positive for redness and maceration bilaterally.  ABDOMEN:  Obese, positive for healed old multiple surgical scars, soft,  nontender in all 4 quadrants, no palpable masses, no  organomegaly.  EXTREMITIES:  Knees positive crepitance, stiffness and tenderness on flexion  of the knees bilaterally. No tibial edema. Palpable dorsalis pedis and  femoral arteries bilaterally.  NEUROLOGIC:  Alert and oriented to person, place and time. Cranial nerves II-  XII appeared intact.   IMPRESSION:  Acute bronchitis.   SECONDARY DIAGNOSES:  1. Hypertension.  2. Osteoarthritis.  3. Irritable bowel syndrome.  4. Osteoporosis.  5. Type 2 diabetes mellitus.   PLAN:  Iv normal saline at 50 cc/hr.  Labs:  CBC, M7, urinalysis, fasting  lipid profile, ABG's on room air.  EKG, chest x-ray.  Medications:  Zithromax dose pak, Flumadine 100 mg p.o. b.i.d. x7 days, Glucophage XR 500  mg p.o. every day with supper, clonidine 0.2 mg p.o. t.i.d., Maxzide 25 mg  p.o. every day, verapamil 240 mg p.o. every day, Premarin 0.3 mg every day,  Cataflam 50 mg p.o. t.i.d. with meals, 1 aspirin 325 mg p.o. every day,  Tessalon Perles 100 mg p.o. q.8h., Tuss-Organidin NR suspension 2 tsp p.o.  q.4h. x72 hours and then q.6h., Combivent metered-dose inhaler 2 puffs  q.i.d., Flovent metered-dose inhaler 120 mcg 2 puffs q.12h.  Respiratory to  collect sputum for Gram stain and culture.  O2 saturation q.6h. x24 hours.  Accu-Cheks 0700 and 1600 every day.  Hemoglobin A1c.  Diet: 1600 ADA, low  salt.  Activity:  Bathroom privileges.                                               Barrie Folk. Berdine Addison, M.D.    Armanda Heritage  D:  10/21/2002  T:  10/21/2002  Job:  [id number]

## 2011-02-03 NOTE — Discharge Summary (Signed)
[name], VIRANI              ACCOUNT NO.:  [id number]   MEDICAL RECORD NO.:  [id number]          PATIENT TYPE:  INP   LOCATION:  A302                          FACILITY:  [facility]   PHYSICIAN:  Barrie Folk. Berdine Addison, MD     DATE OF BIRTH:  01-21-1951   DATE OF ADMISSION:  03/09/2005  DATE OF DISCHARGE:  06/26/2006LH                                 DISCHARGE SUMMARY   The patient is a 60 year old single gravida 2, para 2, AB 0 black female  from [place], [place].  She is complaining with shortness of breath,  wheezing, cough, and general malaise.  The patient was seen in the emergency  room on March 08, 2005 because of these symptoms.  The symptoms had been  progressing since March 05, 2005.  Wheezing started on March 06, 2005.  Cough  was productive at times.  The sputum was described as yellow.  She also  experienced runny nose (yellow) and headache.  Chest x-ray demonstrated  minimal left lower lung segment atelectasis, scar, or early infiltrate, as  read by Dr. Arne Cleveland on March 08, 2005.   PAST MEDICAL HISTORY:  1.  Bronchitis.  2.  Irritable bowel syndrome.  3.  Type 2 diabetes mellitus.  4.  Hypertension.  5.  Osteoarthritis.   Patient was not allergic to any known medication.   HABITS:  Positive for cigarette smoking.   FAMILY HISTORY:  Mother deceased at age 13 secondary to complications of  stroke.  Father deceased at age 61 secondary to complications of diabetes  mellitus.  One brother living at age 42 in good health.  One daughter living  at age 35 in good health.  One son living at age 29 in good health.   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 Aline Brochure, acute bronchitis with hypoxemia in June, 2001 at [facility], acute viral influenza with hypoxemia in December, 1993,  tonsillectomy, uvulectomy, adenoid surgery in October, 1993 at  [facility].  Bilateral salpingo-oophorectomy in October, 1986  secondary to salpingo-oophoritis with hemorrhagic corpus luteum in the left  ovary.  Cholecystectomy.  Hysterectomy secondary to cervical cancer.  Chest  pain and shortness of breath secondary to paroxysmal atrial fibrillation in  November, 1997.  Acute bronchitis in February, 2004.  Atypical chest pain  and peptic ulcer disease in April, 2005.   PROBLEMS:  1.  Exacerbation of chronic bronchitis with bronchospasm:  Labs on admission      were as follows:  Temperature 98.4, pulse 97, respiration 28, blood      pressure 158/82.  General appearance revealed a middle-aged, overweight,      medium height, alert black female who appeared not to feel well but in      no apparent respiratory distress at rest.  Lungs were felt positive for      bilateral wheezes.  Heart revealed audible S1 and S2 without murmurs.      Rhythm was regular.  Significant labs on admission were  as follows:      ABGs on room air show pH 7.42, pCO2 35.1, pO2 67.8, O2 sat 93.1%.  White      count 11.3, hemoglobin 12.8, hematocrit 37, platelets 284,000.  Sodium      135, potassium 4.3, chloride 100, CO2 24.  The patient was treated with      Zithromax IV (dose calculated by pharmacy), Solu-Medrol IV tapering dose      xseveral days, anti-tussive medication, sputum ordered for Gram's stain      and culture, Spiriva 1 capsule in autohaler every day, Xopenex neb      treatment 0.63 mg per 3 cc q.6h. x24 hours, then q.8h., Flovent metered      dose inhaler 10 mcg 2 puffs q.12h., Claritin 10 mg p.o. every day, and      other supportive measures.  The patient's hospital course was a slow but      uphill one.  She continued to experience wheezes bilaterally until March 12, 2005.  She was discharged home on March 13, 2005.  The patient felt      significantly better at the time of discharge.  She was asymptomatic at      rest.  She had no complaint  of significant cough or congestion.  She was      discharged home  on Spiriva, Flovent metered dose inhaler, antitussive      medication, prednisone p.o. and Advair disk (use as directed).  She was      able to ambulate in the room without being very short of breath.   1.  Type 2 diabetes mellitus, on insulin:  Serum glucose on admission was      292.  Other significant labs on admission were as follows:  BUN 15,      creatinine 1.  Urinalysis revealed the following:  Specific gravity      1.025, pH 5, glucose 100 mg per deciliter.  Hemoglobin negative.      Bilirubin negative.  Ketones negative.  Protein 30 mg/dl, nitrite      negative, leukocyte esterase negative, epithelial many, bacteria few.      The patient was treated with insulin on a sliding scale, Accu-Chek over      700, 16 units at 2200 daily.  Metaglip 5/500 p.o. every morning with      breakfast.  It was realized that blood sugar would be elevated due to      side effects of IV steroids.  Medications will be adjusted as an      outpatient as needed also.  The patient was discharged home on March 13, 2005.   1.  Hypertension:  Blood pressure on admission was 158/82.  Lungs      demonstrated bilateral wheezes.  Heart exam revealed audible S1 and S2      without murmur.  Rhythm was regular on admission.  Rate was within      normal limits.  The patient was treated with a low sodium diet.      Triamterene/HCTZ 37.5/25 1 tablet p.o. every day.  Verapamil 120 mg p.o.      every day.  Lisinopril 10 mg p.o. every day.  Atenolol 50 mg p.o. every      day.  An adjustment was made in the medication during this      hospitalization as needed, pertaining to blood pressure.  It should be  noted that the patient was on oxygen during this hospitalization.  Blood      pressure reading on the morning of discharge was 158/96.  Pulse was 56      and regular.  Lung fields were clear.  Heart exam was within normal     limits.   1.   Chronic anxiety: The patient was continued on Niravam 0.5 mg p.o. every      day.  Mood was cooperative throughout this hospitalization.  She      remained alert and oriented to person, place, or time.  She did not      experience any visual, tactile, or auditory hallucinations.   1.  Irritable bowel syndrome:  The patient was continued on Zelnorm 6 mg      p.o. b.i.d. due to a history of constipation.  She did not complain of      constipation, diarrhea, or significant stomach discomfort during this      hospitalization.   1.  Osteoarthritis:  The patient did not complain of joint pain during this      hospitalization.  She was on IV steroids for treatment of asthma and      benefitted potentially her joint discomfort.   1.  Hyperlipidemia:  In addition to calorie and sodium restriction, the      patient will be put on a low cholesterol diet.  Fasting lipid profile on      March 10, 2005 demonstrated the following:  Total cholesterol 242 mg/dl,      triglycerides 96 mg/dl, HDL 58 mg/dl, LDL 165 mg/dl.  Patient will be      on Lovastatin 20 mg p.o. daily as an outpatient.   DISCHARGE INSTRUCTIONS:  1.  Diet:  A 4 gm sodium, 1600 calorie carbohydrate-modified, low      cholesterol.  2.  Activity:  As tolerated.  3.  Medication:  Spiriva 1 capsule in autohaler daily, Flovent metered dose      inhaler 110 mcg 2 puffs q.12h., Robitussin-DM 2 teaspoons p.o. q.4-6h.      as needed for cough, prednisone 10 mg p.o. b.i.d. x5 days, then 10 mg      p.o. daily, Niravam 0.5 mg 1 tablet daily, Advair discus 100/50 1 puff      q.12h., Prilosec 20 mg p.o. daily, Metaglip 5/500 1 tablet p.o. daily,      Triamterene/HCTZ 37.5/25 1 tablet p.o. daily, verapamil 120 mg p.o.      daily, lisinopril 10 mg 1 tablet p.o. daily, atenolol 50 mg 1 tablet      p.o. daily, Zelnorm 6 mg 1 tablet p.o. b.i.d., Tessalon Perles 100 mg 1      p.o. q.8h ,Lovastatin 20 mg po q day.  4.  Follow up with Dr. Berdine Addison in two  weeks.   FINAL PRIMARY DIAGNOSIS:  Exacerbation of chronic bronchitis with  bronchospasm.   SECONDARY DIAGNOSES:  1.  Hypertension.  2.  Type 2 diabetes mellitus.  3.  Irritable bowel syndrome.  4.  Osteoarthritis.  5.  Hyperlipidemia.  6.  Chronic anxiety.       GKH/MEDQ  D:  03/20/2005  T:  03/20/2005  Job:  [id number]

## 2011-02-07 ENCOUNTER — Other Ambulatory Visit: Payer: Self-pay | Admitting: Orthopedic Surgery

## 2011-02-16 ENCOUNTER — Telehealth: Payer: Self-pay | Admitting: Orthopedic Surgery

## 2011-02-16 NOTE — Telephone Encounter (Signed)
[name] called in today to schedule an appointment here.  Told her Dr. Aline Brochure had reviewed the office notes from [facility] And  reccomended she be referered to Dr. Merlene Laughter for pain management, and will hear later when the appointment  Has been scheduled.

## 2011-02-23 ENCOUNTER — Other Ambulatory Visit: Payer: Self-pay | Admitting: Orthopedic Surgery

## 2011-02-23 ENCOUNTER — Telehealth: Payer: Self-pay | Admitting: Radiology

## 2011-02-23 DIAGNOSIS — R52 Pain, unspecified: Secondary | ICD-10-CM

## 2011-02-23 NOTE — Telephone Encounter (Signed)
I called to notify the patient that I referred her to Dr. Merlene Laughter for pain management per Dr. Aline Brochure.

## 2011-02-23 NOTE — Telephone Encounter (Signed)
i called it in

## 2011-06-06 ENCOUNTER — Telehealth: Payer: Self-pay | Admitting: Orthopedic Surgery

## 2011-06-06 NOTE — Telephone Encounter (Signed)
Raquel Sarna from Dr. Freddie Apley office called to say that [name] has been approved for pain management  And is scheduled for 07/05/11

## 2011-06-09 LAB — DIFFERENTIAL
Basophils Relative: 0
Eosinophils Relative: 2
Monocytes Absolute: 0.6
Monocytes Relative: 8
Neutro Abs: 5.2

## 2011-06-09 LAB — CBC
HCT: 37.4
Hemoglobin: 12.9
MCHC: 34.4
MCV: 89.5
RBC: 4.19

## 2011-06-09 LAB — BASIC METABOLIC PANEL
CO2: 29
Calcium: 9.1
Chloride: 100
GFR calc Af Amer: >60
Potassium: 3.8
Sodium: 136

## 2011-06-09 LAB — POCT CARDIAC MARKERS: CKMB, poc: 1.5

## 2011-06-15 ENCOUNTER — Other Ambulatory Visit (HOSPITAL_COMMUNITY): Payer: Self-pay | Admitting: Family Medicine

## 2011-06-15 DIAGNOSIS — N6452 Nipple discharge: Secondary | ICD-10-CM

## 2011-06-16 ENCOUNTER — Encounter: Payer: Self-pay | Admitting: Internal Medicine

## 2011-06-29 ENCOUNTER — Other Ambulatory Visit: Payer: Self-pay | Admitting: Orthopedic Surgery

## 2011-06-29 DIAGNOSIS — G4700 Insomnia, unspecified: Secondary | ICD-10-CM

## 2011-07-26 ENCOUNTER — Ambulatory Visit (HOSPITAL_COMMUNITY)
Admission: RE | Admit: 2011-07-26 | Discharge: 2011-07-26 | Disposition: A | Discharge status: Home / Self Care | Payer: Medicare Other | Source: Ambulatory Visit | Attending: Family Medicine | Admitting: Family Medicine

## 2011-07-26 ENCOUNTER — Other Ambulatory Visit (HOSPITAL_COMMUNITY): Payer: Self-pay | Admitting: Family Medicine

## 2011-07-26 DIAGNOSIS — N6452 Nipple discharge: Secondary | ICD-10-CM

## 2011-07-26 DIAGNOSIS — N6459 Other signs and symptoms in breast: Secondary | ICD-10-CM | POA: Insufficient documentation

## 2012-01-22 ENCOUNTER — Other Ambulatory Visit: Payer: Self-pay | Admitting: Orthopedic Surgery

## 2012-02-05 ENCOUNTER — Other Ambulatory Visit (HOSPITAL_COMMUNITY): Payer: Self-pay | Admitting: Family Medicine

## 2012-02-05 DIAGNOSIS — N6452 Nipple discharge: Secondary | ICD-10-CM

## 2012-02-08 ENCOUNTER — Other Ambulatory Visit (HOSPITAL_COMMUNITY): Payer: Self-pay | Admitting: Family Medicine

## 2012-02-08 ENCOUNTER — Ambulatory Visit (HOSPITAL_COMMUNITY)
Admission: RE | Admit: 2012-02-08 | Discharge: 2012-02-08 | Disposition: A | Discharge status: Home / Self Care | Payer: Medicare Other | Source: Ambulatory Visit | Attending: Family Medicine | Admitting: Family Medicine

## 2012-02-08 DIAGNOSIS — N6459 Other signs and symptoms in breast: Secondary | ICD-10-CM | POA: Insufficient documentation

## 2012-02-08 DIAGNOSIS — N6452 Nipple discharge: Secondary | ICD-10-CM

## 2012-02-08 LAB — POCT I-STAT, CHEM 8
BUN: 17 mg/dL (ref 6–23)
Chloride: 102 mEq/L (ref 96–112)
Glucose, Bld: 174 mg/dL — ABNORMAL HIGH (ref 70–99)
HCT: 42 % (ref 36.0–46.0)
Potassium: 4 mEq/L (ref 3.5–5.1)

## 2012-02-14 ENCOUNTER — Ambulatory Visit (HOSPITAL_COMMUNITY): Admission: RE | Admit: 2012-02-14 | Payer: Medicare Other | Source: Ambulatory Visit

## 2012-02-19 ENCOUNTER — Ambulatory Visit (HOSPITAL_COMMUNITY): Admission: RE | Admit: 2012-02-19 | Payer: Medicare Other | Source: Ambulatory Visit

## 2012-02-19 ENCOUNTER — Ambulatory Visit (HOSPITAL_COMMUNITY)
Admission: RE | Admit: 2012-02-19 | Discharge: 2012-02-19 | Disposition: A | Discharge status: Home / Self Care | Payer: Medicare Other | Source: Ambulatory Visit | Attending: Family Medicine | Admitting: Family Medicine

## 2012-02-19 DIAGNOSIS — E237 Disorder of pituitary gland, unspecified: Secondary | ICD-10-CM | POA: Insufficient documentation

## 2012-02-19 DIAGNOSIS — N6459 Other signs and symptoms in breast: Secondary | ICD-10-CM | POA: Insufficient documentation

## 2012-02-19 DIAGNOSIS — N6452 Nipple discharge: Secondary | ICD-10-CM

## 2012-02-19 DIAGNOSIS — Z8543 Personal history of malignant neoplasm of ovary: Secondary | ICD-10-CM | POA: Insufficient documentation

## 2012-02-19 MED ORDER — GADOBENATE DIMEGLUMINE 529 MG/ML IV SOLN
10.0000 mL | Freq: Once | INTRAVENOUS | Status: AC | PRN
  Administered 2012-02-19: 10 mL via INTRAVENOUS

## 2012-06-27 ENCOUNTER — Other Ambulatory Visit (HOSPITAL_COMMUNITY): Payer: Self-pay | Admitting: Family Medicine

## 2012-06-27 DIAGNOSIS — Z139 Encounter for screening, unspecified: Secondary | ICD-10-CM

## 2012-07-26 ENCOUNTER — Ambulatory Visit (HOSPITAL_COMMUNITY)
Admission: RE | Admit: 2012-07-26 | Discharge: 2012-07-26 | Disposition: A | Discharge status: Home / Self Care | Payer: Medicare Other | Source: Ambulatory Visit | Attending: Family Medicine | Admitting: Family Medicine

## 2012-07-26 DIAGNOSIS — Z139 Encounter for screening, unspecified: Secondary | ICD-10-CM

## 2012-07-26 DIAGNOSIS — Z1231 Encounter for screening mammogram for malignant neoplasm of breast: Secondary | ICD-10-CM | POA: Insufficient documentation

## 2012-07-31 ENCOUNTER — Other Ambulatory Visit: Payer: Self-pay | Admitting: Family Medicine

## 2012-07-31 DIAGNOSIS — R928 Other abnormal and inconclusive findings on diagnostic imaging of breast: Secondary | ICD-10-CM

## 2012-08-08 ENCOUNTER — Other Ambulatory Visit (HOSPITAL_COMMUNITY): Payer: Self-pay | Admitting: Family Medicine

## 2012-08-08 ENCOUNTER — Ambulatory Visit (HOSPITAL_COMMUNITY)
Admission: RE | Admit: 2012-08-08 | Discharge: 2012-08-08 | Disposition: A | Discharge status: Home / Self Care | Payer: Medicare Other | Source: Ambulatory Visit | Attending: Family Medicine | Admitting: Family Medicine

## 2012-08-08 DIAGNOSIS — R52 Pain, unspecified: Secondary | ICD-10-CM

## 2012-08-08 DIAGNOSIS — M25552 Pain in left hip: Secondary | ICD-10-CM

## 2012-08-08 DIAGNOSIS — M25559 Pain in unspecified hip: Secondary | ICD-10-CM | POA: Insufficient documentation

## 2012-08-14 ENCOUNTER — Encounter (HOSPITAL_COMMUNITY): Payer: Medicare Other

## 2012-08-21 ENCOUNTER — Ambulatory Visit (HOSPITAL_COMMUNITY)
Admission: RE | Admit: 2012-08-21 | Discharge: 2012-08-21 | Disposition: A | Discharge status: Home / Self Care | Payer: Medicare Other | Source: Ambulatory Visit | Attending: Family Medicine | Admitting: Family Medicine

## 2012-08-21 ENCOUNTER — Other Ambulatory Visit: Payer: Self-pay | Admitting: Family Medicine

## 2012-08-21 ENCOUNTER — Other Ambulatory Visit (HOSPITAL_COMMUNITY): Payer: Self-pay | Admitting: Family Medicine

## 2012-08-21 DIAGNOSIS — N6459 Other signs and symptoms in breast: Secondary | ICD-10-CM | POA: Insufficient documentation

## 2012-08-21 DIAGNOSIS — R928 Other abnormal and inconclusive findings on diagnostic imaging of breast: Secondary | ICD-10-CM

## 2012-08-21 DIAGNOSIS — N63 Unspecified lump in unspecified breast: Secondary | ICD-10-CM | POA: Insufficient documentation

## 2012-11-04 ENCOUNTER — Other Ambulatory Visit: Payer: Self-pay | Admitting: Family Medicine

## 2012-11-04 DIAGNOSIS — R928 Other abnormal and inconclusive findings on diagnostic imaging of breast: Secondary | ICD-10-CM

## 2012-11-06 ENCOUNTER — Other Ambulatory Visit (HOSPITAL_COMMUNITY): Payer: Self-pay | Admitting: Family Medicine

## 2012-11-06 ENCOUNTER — Ambulatory Visit (HOSPITAL_COMMUNITY)
Admission: RE | Admit: 2012-11-06 | Discharge: 2012-11-06 | Disposition: A | Discharge status: Home / Self Care | Payer: Medicare Other | Source: Ambulatory Visit | Attending: Family Medicine | Admitting: Family Medicine

## 2012-11-06 DIAGNOSIS — N644 Mastodynia: Secondary | ICD-10-CM | POA: Insufficient documentation

## 2012-11-06 DIAGNOSIS — R928 Other abnormal and inconclusive findings on diagnostic imaging of breast: Secondary | ICD-10-CM

## 2012-11-12 ENCOUNTER — Other Ambulatory Visit: Payer: Medicare Other

## 2012-11-20 ENCOUNTER — Ambulatory Visit (HOSPITAL_COMMUNITY)
Admission: RE | Admit: 2012-11-20 | Discharge: 2012-11-20 | Disposition: A | Discharge status: Home / Self Care | Payer: Medicare Other | Source: Ambulatory Visit | Attending: Family Medicine | Admitting: Family Medicine

## 2012-11-20 DIAGNOSIS — Z09 Encounter for follow-up examination after completed treatment for conditions other than malignant neoplasm: Secondary | ICD-10-CM

## 2013-02-04 ENCOUNTER — Ambulatory Visit: Payer: Medicare Other | Admitting: Orthopedic Surgery

## 2013-02-17 ENCOUNTER — Other Ambulatory Visit: Payer: Self-pay | Admitting: *Deleted

## 2013-02-17 DIAGNOSIS — M5137 Other intervertebral disc degeneration, lumbosacral region: Secondary | ICD-10-CM

## 2013-02-17 MED ORDER — AMITRIPTYLINE HCL 50 MG PO TABS: 50.0000 mg | ORAL_TABLET | Freq: Every day | ORAL | Status: DC

## 2013-02-27 ENCOUNTER — Ambulatory Visit (INDEPENDENT_AMBULATORY_CARE_PROVIDER_SITE_OTHER): Payer: Medicare Other

## 2013-02-27 ENCOUNTER — Encounter: Payer: Self-pay | Admitting: Orthopedic Surgery

## 2013-02-27 ENCOUNTER — Ambulatory Visit (INDEPENDENT_AMBULATORY_CARE_PROVIDER_SITE_OTHER): Payer: Medicare Other | Admitting: Orthopedic Surgery

## 2013-02-27 VITALS — BP 150/82 | Ht 65.0 in | Wt 257.0 lb

## 2013-02-27 DIAGNOSIS — M7630 Iliotibial band syndrome, unspecified leg: Secondary | ICD-10-CM

## 2013-02-27 DIAGNOSIS — M48062 Spinal stenosis, lumbar region with neurogenic claudication: Secondary | ICD-10-CM | POA: Insufficient documentation

## 2013-02-27 DIAGNOSIS — M25559 Pain in unspecified hip: Secondary | ICD-10-CM

## 2013-02-27 DIAGNOSIS — M25552 Pain in left hip: Secondary | ICD-10-CM

## 2013-02-27 DIAGNOSIS — M25562 Pain in left knee: Secondary | ICD-10-CM

## 2013-02-27 DIAGNOSIS — M25569 Pain in unspecified knee: Secondary | ICD-10-CM

## 2013-02-27 DIAGNOSIS — M6580 Other synovitis and tenosynovitis, unspecified site: Secondary | ICD-10-CM

## 2013-02-27 DIAGNOSIS — Z96659 Presence of unspecified artificial knee joint: Secondary | ICD-10-CM

## 2013-02-27 NOTE — Patient Instructions (Addendum)
HIP NORMAL   LEFT KNEE BURSITIS   YOUR BACK IS CAUSING YOUR PROBLEM

## 2013-02-27 NOTE — Progress Notes (Signed)
Patient ID: [name], female   DOB: 12-29-1950, 62 y.o.   MRN: [id number] Chief complaint left groin pain and lateral left knee pain. She says her groin hurts worse in the knee. She had a left total knee back in 2010. She complains of catching and locking of the left hip and groin with pain radiating down into the left knee. She also has isolated lateral left knee pain just below the joint line. She has numbness and tingling but has a history of lumbar arthrosis  Her pain is 6/10 sharp burning and constant and is worse with walking. She has frequent episodes of the left leg giving way  She has a history of diarrhea joint pain and swelling muscle pain allergies  Denies chest pain shortness of breath frequency or urgency  She has hypertension and diabetes she's had a gallbladder removed she had ovarian cancer with surgery she had knee replacement surgery as well. She is followed by Dr. Berdine Addison in [place]. She takes atenolol Crestor Humalog triamterene hydrochlorothiazide metformin lisinopril amitriptyline  She has a history in her family of arthritis and cancer as well as diabetes  She is widowed, retired, smokes one pack cigarettes per day. She has been diagnosed prior with degenerative disc disease lumbosacral spine and spinal stenosis [facility] in January of 2011 and she had 3 epidural injections for left leg pain.  BP 150/82  Ht 5\' 5"  (1.651 m)  Wt 257 lb (116.574 kg)  BMI 42.77 kg/m2 She is obese she is oriented x3 her mood is pleasant her affect is flat she is ambulatory she doesn't use assistive device in the office but says her pain is in the car is tenderness and swelling over the lateral knee and Gerdes tubercle with painful flexion extension at this point the knee joint does not have effusion collateral ligaments are stable AP stability confirmed with anterior posterior drawer test. Muscle tone is normal skin is intact has good distal pulse and no sensory deficit she has no pain  with rotation of her hip and no restrictions of hip flexion and no hip flexion contracture  We took several x-rays today number one x-ray was to evaluate the left knee prosthesis and there was no loosening or complication is noted  We took an x-ray left hip which showed a normal left hip joint  Impression persistent spinal stenosis and progressive disease  She does not want to have anything done at this time she will followup as needed

## 2013-04-02 ENCOUNTER — Encounter (HOSPITAL_COMMUNITY): Payer: Self-pay | Admitting: *Deleted

## 2013-04-02 ENCOUNTER — Emergency Department (HOSPITAL_COMMUNITY): Payer: Medicare Other

## 2013-04-02 ENCOUNTER — Observation Stay (HOSPITAL_COMMUNITY)
Admission: EM | Admit: 2013-04-02 | Discharge: 2013-04-03 | Disposition: A | Discharge status: Home / Self Care | Payer: Medicare Other | Attending: Internal Medicine | Admitting: Internal Medicine

## 2013-04-02 DIAGNOSIS — N184 Chronic kidney disease, stage 4 (severe): Secondary | ICD-10-CM | POA: Diagnosis present

## 2013-04-02 DIAGNOSIS — Z8543 Personal history of malignant neoplasm of ovary: Secondary | ICD-10-CM

## 2013-04-02 DIAGNOSIS — M48062 Spinal stenosis, lumbar region with neurogenic claudication: Secondary | ICD-10-CM

## 2013-04-02 DIAGNOSIS — R0789 Other chest pain: Principal | ICD-10-CM | POA: Diagnosis present

## 2013-04-02 DIAGNOSIS — M5137 Other intervertebral disc degeneration, lumbosacral region: Secondary | ICD-10-CM

## 2013-04-02 DIAGNOSIS — E669 Obesity, unspecified: Secondary | ICD-10-CM | POA: Insufficient documentation

## 2013-04-02 DIAGNOSIS — M545 Low back pain: Secondary | ICD-10-CM

## 2013-04-02 DIAGNOSIS — I10 Essential (primary) hypertension: Secondary | ICD-10-CM | POA: Insufficient documentation

## 2013-04-02 DIAGNOSIS — E119 Type 2 diabetes mellitus without complications: Secondary | ICD-10-CM | POA: Insufficient documentation

## 2013-04-02 DIAGNOSIS — J209 Acute bronchitis, unspecified: Secondary | ICD-10-CM | POA: Diagnosis present

## 2013-04-02 DIAGNOSIS — M5126 Other intervertebral disc displacement, lumbar region: Secondary | ICD-10-CM

## 2013-04-02 DIAGNOSIS — E1122 Type 2 diabetes mellitus with diabetic chronic kidney disease: Secondary | ICD-10-CM | POA: Diagnosis present

## 2013-04-02 DIAGNOSIS — F17200 Nicotine dependence, unspecified, uncomplicated: Secondary | ICD-10-CM | POA: Insufficient documentation

## 2013-04-02 DIAGNOSIS — Z96659 Presence of unspecified artificial knee joint: Secondary | ICD-10-CM

## 2013-04-02 DIAGNOSIS — Z8679 Personal history of other diseases of the circulatory system: Secondary | ICD-10-CM

## 2013-04-02 DIAGNOSIS — R079 Chest pain, unspecified: Secondary | ICD-10-CM

## 2013-04-02 DIAGNOSIS — Z720 Tobacco use: Secondary | ICD-10-CM | POA: Diagnosis present

## 2013-04-02 DIAGNOSIS — K219 Gastro-esophageal reflux disease without esophagitis: Secondary | ICD-10-CM | POA: Diagnosis present

## 2013-04-02 DIAGNOSIS — J42 Unspecified chronic bronchitis: Secondary | ICD-10-CM | POA: Insufficient documentation

## 2013-04-02 DIAGNOSIS — M1710 Unilateral primary osteoarthritis, unspecified knee: Secondary | ICD-10-CM

## 2013-04-02 DIAGNOSIS — M7630 Iliotibial band syndrome, unspecified leg: Secondary | ICD-10-CM

## 2013-04-02 DIAGNOSIS — M4800 Spinal stenosis, site unspecified: Secondary | ICD-10-CM

## 2013-04-02 LAB — GLUCOSE, CAPILLARY: Glucose-Capillary: 134 mg/dL — ABNORMAL HIGH (ref 70–99)

## 2013-04-02 LAB — BASIC METABOLIC PANEL
CO2: 28 mEq/L (ref 19–32)
Chloride: 100 mEq/L (ref 96–112)
Potassium: 3.6 mEq/L (ref 3.5–5.1)
Sodium: 137 mEq/L (ref 135–145)

## 2013-04-02 LAB — CBC
Platelets: 261 10*3/uL (ref 150–400)
RBC: 4.12 MIL/uL (ref 3.87–5.11)
WBC: 8.9 10*3/uL (ref 4.0–10.5)

## 2013-04-02 MED ORDER — KETOROLAC TROMETHAMINE 30 MG/ML IJ SOLN
30.0000 mg | Freq: Once | INTRAMUSCULAR | Status: AC
  Administered 2013-04-02: 30 mg via INTRAVENOUS
  Filled 2013-04-02: qty 1

## 2013-04-02 MED ORDER — ATENOLOL 25 MG PO TABS
50.0000 mg | ORAL_TABLET | Freq: Every day | ORAL | Status: DC
  Administered 2013-04-02 – 2013-04-03 (×2): 50 mg via ORAL
  Filled 2013-04-02 (×2): qty 2

## 2013-04-02 MED ORDER — ASPIRIN EC 81 MG PO TBEC
81.0000 mg | DELAYED_RELEASE_TABLET | Freq: Every day | ORAL | Status: DC
  Administered 2013-04-03: 81 mg via ORAL
  Filled 2013-04-02: qty 1

## 2013-04-02 MED ORDER — DEXTROSE 5 % IV SOLN
INTRAVENOUS | Status: AC
  Filled 2013-04-02: qty 500

## 2013-04-02 MED ORDER — TROLAMINE SALICYLATE 10 % EX CREA: TOPICAL_CREAM | CUTANEOUS | Status: DC | PRN

## 2013-04-02 MED ORDER — METHYLPREDNISOLONE SODIUM SUCC 125 MG IJ SOLR
60.0000 mg | Freq: Once | INTRAMUSCULAR | Status: AC
  Administered 2013-04-02: 60 mg via INTRAVENOUS
  Filled 2013-04-02: qty 2

## 2013-04-02 MED ORDER — ASPIRIN 81 MG PO CHEW
324.0000 mg | CHEWABLE_TABLET | Freq: Once | ORAL | Status: AC
  Administered 2013-04-02: 324 mg via ORAL
  Filled 2013-04-02: qty 4

## 2013-04-02 MED ORDER — INSULIN ASPART PROT & ASPART (70-30 MIX) 100 UNIT/ML ~~LOC~~ SUSP
SUBCUTANEOUS | Status: AC
  Filled 2013-04-02: qty 10

## 2013-04-02 MED ORDER — LISINOPRIL 10 MG PO TABS
10.0000 mg | ORAL_TABLET | Freq: Every day | ORAL | Status: DC
  Administered 2013-04-02 – 2013-04-03 (×2): 10 mg via ORAL
  Filled 2013-04-02 (×2): qty 1

## 2013-04-02 MED ORDER — MUSCLE RUB 10-15 % EX CREA: TOPICAL_CREAM | CUTANEOUS | Status: DC | PRN

## 2013-04-02 MED ORDER — ACETAMINOPHEN 325 MG PO TABS: 650.0000 mg | ORAL_TABLET | Freq: Four times a day (QID) | ORAL | Status: DC | PRN

## 2013-04-02 MED ORDER — INSULIN ASPART PROT & ASPART (70-30 MIX) 100 UNIT/ML ~~LOC~~ SUSP
20.0000 [IU] | Freq: Two times a day (BID) | SUBCUTANEOUS | Status: DC
  Administered 2013-04-02 – 2013-04-03 (×2): 20 [IU] via SUBCUTANEOUS
  Filled 2013-04-02: qty 10

## 2013-04-02 MED ORDER — ATORVASTATIN CALCIUM 10 MG PO TABS
10.0000 mg | ORAL_TABLET | Freq: Every day | ORAL | Status: DC
  Administered 2013-04-02: 10 mg via ORAL
  Filled 2013-04-02: qty 1

## 2013-04-02 MED ORDER — GI COCKTAIL ~~LOC~~: 30.0000 mL | Freq: Two times a day (BID) | ORAL | Status: DC | PRN

## 2013-04-02 MED ORDER — GUAIFENESIN ER 600 MG PO TB12: 600.0000 mg | ORAL_TABLET | Freq: Two times a day (BID) | ORAL | Status: DC

## 2013-04-02 MED ORDER — DEXTROSE 5 % IV SOLN
500.0000 mg | INTRAVENOUS | Status: DC
  Administered 2013-04-02: 500 mg via INTRAVENOUS
  Filled 2013-04-02 (×2): qty 500

## 2013-04-02 MED ORDER — ACETAMINOPHEN 650 MG RE SUPP: 650.0000 mg | Freq: Four times a day (QID) | RECTAL | Status: DC | PRN

## 2013-04-02 MED ORDER — TRIAMTERENE-HCTZ 37.5-25 MG PO TABS
1.0000 | ORAL_TABLET | Freq: Every day | ORAL | Status: DC
  Administered 2013-04-03: 1 via ORAL
  Filled 2013-04-02 (×2): qty 1

## 2013-04-02 MED ORDER — INSULIN ASPART 100 UNIT/ML ~~LOC~~ SOLN
0.0000 [IU] | Freq: Three times a day (TID) | SUBCUTANEOUS | Status: DC
Start: 2013-04-03 — End: 2013-04-03
  Administered 2013-04-03 (×2): 8 [IU] via SUBCUTANEOUS

## 2013-04-02 MED ORDER — IPRATROPIUM BROMIDE 0.02 % IN SOLN
RESPIRATORY_TRACT | Status: AC
  Administered 2013-04-02: 0.5 mg via RESPIRATORY_TRACT
  Filled 2013-04-02: qty 2.5

## 2013-04-02 MED ORDER — IPRATROPIUM BROMIDE 0.02 % IN SOLN
0.5000 mg | RESPIRATORY_TRACT | Status: DC
  Administered 2013-04-02 – 2013-04-03 (×3): 0.5 mg via RESPIRATORY_TRACT
  Filled 2013-04-02 (×4): qty 2.5

## 2013-04-02 MED ORDER — HYDROCOD POLST-CHLORPHEN POLST 10-8 MG/5ML PO LQCR: 5.0000 mL | Freq: Every evening | ORAL | Status: DC | PRN

## 2013-04-02 MED ORDER — PREDNISONE 20 MG PO TABS
50.0000 mg | ORAL_TABLET | Freq: Every day | ORAL | Status: DC
  Administered 2013-04-03: 50 mg via ORAL
  Filled 2013-04-02: qty 2

## 2013-04-02 MED ORDER — INSULIN ASPART 100 UNIT/ML ~~LOC~~ SOLN
0.0000 [IU] | Freq: Every day | SUBCUTANEOUS | Status: DC
  Administered 2013-04-02: 3 [IU] via SUBCUTANEOUS

## 2013-04-02 MED ORDER — ALBUTEROL SULFATE (5 MG/ML) 0.5% IN NEBU
2.5000 mg | INHALATION_SOLUTION | RESPIRATORY_TRACT | Status: DC
  Administered 2013-04-02 – 2013-04-03 (×3): 2.5 mg via RESPIRATORY_TRACT
  Filled 2013-04-02 (×4): qty 0.5

## 2013-04-02 MED ORDER — BENZONATATE 100 MG PO CAPS
100.0000 mg | ORAL_CAPSULE | Freq: Three times a day (TID) | ORAL | Status: DC
  Administered 2013-04-02 – 2013-04-03 (×3): 100 mg via ORAL
  Filled 2013-04-02 (×3): qty 1

## 2013-04-02 MED ORDER — ONDANSETRON HCL 4 MG PO TABS: 4.0000 mg | ORAL_TABLET | Freq: Four times a day (QID) | ORAL | Status: DC | PRN

## 2013-04-02 MED ORDER — OXYCODONE HCL 5 MG PO TABS
5.0000 mg | ORAL_TABLET | ORAL | Status: DC | PRN
  Administered 2013-04-02: 5 mg via ORAL
  Filled 2013-04-02: qty 1

## 2013-04-02 MED ORDER — AMITRIPTYLINE HCL 25 MG PO TABS
50.0000 mg | ORAL_TABLET | Freq: Every day | ORAL | Status: DC
  Administered 2013-04-02: 50 mg via ORAL
  Filled 2013-04-02: qty 2

## 2013-04-02 MED ORDER — ALBUTEROL SULFATE (5 MG/ML) 0.5% IN NEBU
INHALATION_SOLUTION | RESPIRATORY_TRACT | Status: AC
  Administered 2013-04-02: 2.5 mg via RESPIRATORY_TRACT
  Filled 2013-04-02: qty 0.5

## 2013-04-02 MED ORDER — DEXTROMETHORPHAN POLISTIREX 30 MG/5ML PO LQCR
30.0000 mg | Freq: Two times a day (BID) | ORAL | Status: DC
  Administered 2013-04-02 – 2013-04-03 (×2): 30 mg via ORAL
  Filled 2013-04-02 (×4): qty 5

## 2013-04-02 MED ORDER — INSULIN DETEMIR 100 UNIT/ML ~~LOC~~ SOLN: 12.0000 [IU] | Freq: Two times a day (BID) | SUBCUTANEOUS | Status: DC

## 2013-04-02 MED ORDER — INSULIN ASPART 100 UNIT/ML ~~LOC~~ SOLN: 0.0000 [IU] | Freq: Three times a day (TID) | SUBCUTANEOUS | Status: DC

## 2013-04-02 MED ORDER — ONDANSETRON HCL 4 MG/2ML IJ SOLN: 4.0000 mg | Freq: Four times a day (QID) | INTRAMUSCULAR | Status: DC | PRN

## 2013-04-02 MED ORDER — VERAPAMIL HCL ER 240 MG PO TBCR
240.0000 mg | EXTENDED_RELEASE_TABLET | Freq: Every morning | ORAL | Status: DC
  Administered 2013-04-03: 240 mg via ORAL
  Filled 2013-04-02: qty 1

## 2013-04-02 MED ORDER — PANTOPRAZOLE SODIUM 40 MG PO TBEC
40.0000 mg | DELAYED_RELEASE_TABLET | Freq: Every day | ORAL | Status: DC
  Administered 2013-04-02 – 2013-04-03 (×2): 40 mg via ORAL
  Filled 2013-04-02 (×2): qty 1

## 2013-04-02 NOTE — ED Notes (Signed)
Pt states productive cough, white in color x 1 week.

## 2013-04-02 NOTE — ED Notes (Signed)
Pain to left chest first noticed at 0230 when pt woke up to use restroom. Pt states constant pain. Deep breathing makes pain worse.

## 2013-04-02 NOTE — H&P (Signed)
[facility] History and Physical  [name] [id number] DOB: 01/27/1951 DOA: 04/02/2013  Referring physician: Dr. Karle Starch    PCP: Maggie Font, MD  Specialists:   Chief Complaint: Left sided chest pain  HPI: [name] is a 62 y.o. female with a past medical history of peptic ulcer disease, diabetes, hypertension, chronic bronchitis, and ovarian cancer presents to the emergency department with left-sided chest pain. The patient reports that she has had a nonproductive cough for 2 weeks. At 2:30 this morning she started noticing chest pain under her left breast it radiates towards her shoulder. It is worse with movement, coughing and with deep inspiration. At rest she does not have pain. The patient has a 50-pack-year tobacco history and still smokes approximately one pack per day. She has seen her primary care physician recently and is using an albuterol inhaler and Tessalon Perles. She denies any nausea, shortness of breath, orthopnea, PND. She also denies fever vomiting, or change in bowel habits. The patient has a history of peptic ulcer disease (15 ulcers) for which she had a partial antrectomy. She reports these ulcers were caused by NSAIDs. Still she takes Advil occasionally for back pain. She has had 2 cardiac workups in the past one by Dr. Montez Morita some 25 years ago, and 1 by Marlow approximately 15 years ago.  Per her report both of these workups were negative.  Review of Systems: The patient denies anorexia, fever, weight loss,, vision loss, decreased hearing, hoarseness, syncope, dyspnea on exertion, peripheral edema, balance deficits, hemoptysis, abdominal pain, melena, hematochezia,  hematuria, incontinence, genital sores, muscle weakness, suspicious skin lesions, transient blindness, difficulty walking, depression, unusual weight change, abnormal bleeding    Past Medical History  Diagnosis Date  . Diabetes mellitus   . Hypertension   . Cancer    ovarian   Past Surgical History  Procedure Laterality Date  . Replacement total knee    . Cholecystectomy    . Abdominal hysterectomy     Social History:  reports that she has been smoking Cigarettes.  She has been smoking about 1.00 pack per day. She does not have any smokeless tobacco history on file. She reports that she does not drink alcohol. Her drug history is not on file.  she has a 50 year smoking history. She denies alcohol use. She is independent with her ADLs at home   Allergies  Allergen Reactions  . Gabapentin     REACTION: suicidal thought    Her mother died with a stroke, she also had diabetes. Her father passed after having both of his legs amputated, likely from postop complications.  Prior to Admission medications   Medication Sig Start Date End Date Taking? Authorizing Provider  amitriptyline (ELAVIL) 50 MG tablet Take 50 mg by mouth at bedtime. 02/17/13  Yes Carole Civil, MD  aspirin EC 81 MG tablet Take 81 mg by mouth daily.   Yes Historical Provider, MD  atenolol (TENORMIN) 50 MG tablet Take 50 mg by mouth daily.   Yes Historical Provider, MD  ibuprofen (ADVIL,MOTRIN) 200 MG tablet Take 400 mg by mouth every 6 (six) hours as needed for pain.   Yes Historical Provider, MD  insulin lispro protamine-lispro (HUMALOG 75/25) (75-25) 100 UNIT/ML SUSP Inject 20-40 Units into the skin 2 (two) times daily with a meal. 40 units in am and 20 units in pm   Yes Historical Provider, MD  lisinopril (PRINIVIL,ZESTRIL) 10 MG tablet Take 10 mg by mouth daily.  Yes Historical Provider, MD  metFORMIN (GLUCOPHAGE) 500 MG tablet Take 1,000 mg by mouth 2 (two) times daily with a meal.   Yes Historical Provider, MD  rosuvastatin (CRESTOR) 5 MG tablet Take 5 mg by mouth at bedtime.   Yes Historical Provider, MD  triamterene-hydrochlorothiazide (MAXZIDE-25) 37.5-25 MG per tablet Take 1 tablet by mouth daily.   Yes Historical Provider, MD  verapamil (COVERA HS) 240 MG (CO) 24 hr tablet  Take 240 mg by mouth every morning.   Yes Historical Provider, MD   Physical Exam: Filed Vitals:   04/02/13 1336  BP: 150/71  Pulse: 92  Temp: 98.9 F (37.2 C)  TempSrc: Oral  Resp: 16  Height: 5\' 5"  (1.651 m)  Weight: 115.667 kg (255 lb)  SpO2: 96%     General:  Well-developed, obese, pleasant, female in no apparent distress  ENT: Moist mucous membranes, good dentition  Neck: Supple,   Cardiovascular: Regular rate and rhythm, no murmurs rubs or gallops, no lower extremity edema, no JVD noted  Respiratory: Coarse expiratory breath sounds on the left, clear on the right, no increased work of breathing  Abdomen: Soft, nontender, nondistended, positive bowel sounds, no masses  Skin: No obvious rash bruise or lesions  Musculoskeletal: Able to ambulate about the room, she has bilateral well-healed scars on her knees. Mild to moderate tenderness over the left pectoris major muscle without spasm, edema, or erythema.  Psychiatric: Alert and oriented, cooperative, appropriate  Neurologic: Cranial nerves II through XII are grossly intact, nonfocal  Labs on Admission:  Basic Metabolic Panel:  Recent Labs Lab 04/02/13 1349  NA 137  K 3.6  CL 100  CO2 28  GLUCOSE 217*  BUN 18  CREATININE 1.01  CALCIUM 9.3   CBC:  Recent Labs Lab 04/02/13 1349  WBC 8.9  HGB 12.7  HCT 37.7  MCV 91.5  PLT 261   Cardiac Enzymes:  Recent Labs Lab 04/02/13 1349  TROPONINI <0.30    Radiological Exams on Admission: Dg Chest 2 View  04/02/2013   *RADIOLOGY REPORT*  Clinical Data: Chest pain  CHEST - 2 VIEW  Comparison: 10/28/2007  Findings: Heart size appears normal. Tortuous unfolded thoracic aorta containing atherosclerotic calcifications noted.  No pleural effusion or edema.  No airspace consolidation.  There is mild spondylosis within the thoracic spine.  IMPRESSION:  1.  No acute cardiopulmonary abnormalities.   Original Report Authenticated By: Kerby Moors, M.D.    EKG:  Independently reviewed. Normal sinus rhythm  Assessment/Plan Principal Problem:   Atypical chest pain Active Problems:   DIABETES   HIGH BLOOD PRESSURE   GERD (gastroesophageal reflux disease)   Chronic bronchitis with acute exacerbation   History of ovarian cancer  Atypical chest pain Musculoskeletal in nature. Likely caused by 2 weeks of coughing Will treat supportively with Aspercreme and cough medicine Will rule out ACS by cycling troponin x3 Daily 81 mg aspirin Will also order GI cocktail when necessary as well as Protonix daily Doubt PE, but if her chest pain worsens or she develops fever or tachycardia or hemoptysis consider CT angiogram chest Hopefully she will be discharged tomorrow July 17  Chronic bronchitis with exacerbation Chest x-ray appears clear Nebulizers, mucolytic, incentive spirometry, low-dose steroids and azithromycin  Will give one dose of Solu-Medrol IV then changed to prednisone tomorrow.  Tobacco abuse Counseled Refused nicotine patch  Diabetes Check hemoglobin A1c Continue 70/30 at slightly decreased doses Sliding scale insulin sensitive Carb modified diet  Hypertension Moderately Controlled Continue home  medications including atenolol, lisinopril, verapamil and Maxzide    Code Status:  Full  Family Communication:  Disposition Plan: Observation  Time spent: 60 minutes   Rexene Alberts, M.D. [phone]  Imogene Burn L, P.A.-C.  [facility] [phone]    If 7PM-7AM, please contact night-coverage [email or web address] Password [facility] 04/02/2013, 3:35 PM    Attending: The patient was seen and examined. She was discussed with PA, Ms. Allean Found. The above note has been amended and/or annotated as needed. Otherwise, agree with assessment and plan. The patient was strongly advised to stop smoking. A nicotine patch was offered, but she declines for now. Will discontinue guaifenesin and add Tessalon Perles 3 times daily scheduled. We'll check  a TSH for further evaluation.

## 2013-04-02 NOTE — ED Provider Notes (Signed)
History    CSN: [id number] Arrival date & time 04/02/13  65  First MD Initiated Contact with Patient 04/02/13 1351     Chief Complaint  Patient presents with  . Chest Pain   (Consider location/radiation/quality/duration/timing/severity/associated sxs/prior Treatment) Patient is a 62 y.o. female presenting with chest pain.  Chest Pain  Pt with history of DM, HTN and tobacco abuse but no known CAD reports moderate to severe aching L sided chest pain since about 0230hrs today when she woke up to use the rest room. Pain has been waxing and waning all day, worse with movement and deep breath. Denies reports cough off and on for several days but no SOB and no arm pain. No prior history of same. She has remote history of ovarian cancer treated 40 years ago. She denies any recent travel, no leg swelling, no recent surgeries. PERC neg. TIMI 2 (risk factors and ASA use).   Past Medical History  Diagnosis Date  . Diabetes mellitus   . Hypertension   . Cancer     ovarian   Past Surgical History  Procedure Laterality Date  . Replacement total knee    . Cholecystectomy    . Abdominal hysterectomy     No family history on file. History  Substance Use Topics  . Smoking status: Current Every Day Smoker -- 1.00 packs/day    Types: Cigarettes  . Smokeless tobacco: Not on file  . Alcohol Use: No   OB History   Grav Para Term Preterm Abortions TAB SAB Ect Mult Living                 Review of Systems  Cardiovascular: Positive for chest pain.   All other systems reviewed and are negative except as noted in HPI.   Allergies  Gabapentin  Home Medications   Current Outpatient Rx  Name  Route  Sig  Dispense  Refill  . amitriptyline (ELAVIL) 50 MG tablet   Oral   Take 50 mg by mouth at bedtime.         Marland Kitchen aspirin EC 81 MG tablet   Oral   Take 81 mg by mouth daily.         Marland Kitchen atenolol (TENORMIN) 50 MG tablet   Oral   Take 50 mg by mouth daily.         Marland Kitchen ibuprofen  (ADVIL,MOTRIN) 200 MG tablet   Oral   Take 400 mg by mouth every 6 (six) hours as needed for pain.         Marland Kitchen insulin lispro protamine-lispro (HUMALOG 75/25) (75-25) 100 UNIT/ML SUSP   Subcutaneous   Inject 20-40 Units into the skin 2 (two) times daily with a meal. 40 units in am and 20 units in pm         . lisinopril (PRINIVIL,ZESTRIL) 10 MG tablet   Oral   Take 10 mg by mouth daily.         . metFORMIN (GLUCOPHAGE) 500 MG tablet   Oral   Take 1,000 mg by mouth 2 (two) times daily with a meal.         . rosuvastatin (CRESTOR) 5 MG tablet   Oral   Take 5 mg by mouth at bedtime.         . triamterene-hydrochlorothiazide (MAXZIDE-25) 37.5-25 MG per tablet   Oral   Take 1 tablet by mouth daily.         . verapamil (COVERA HS) 240 MG (CO) 24 hr  tablet   Oral   Take 240 mg by mouth every morning.          BP 150/71  Pulse 92  Temp(Src) 98.9 F (37.2 C) (Oral)  Resp 16  Ht 5\' 5"  (1.651 m)  Wt 255 lb (115.667 kg)  BMI 42.43 kg/m2  SpO2 96% Physical Exam  Nursing note and vitals reviewed. Constitutional: She is oriented to person, place, and time. She appears well-developed and well-nourished.  HENT:  Head: Normocephalic and atraumatic.  Eyes: EOM are normal. Pupils are equal, round, and reactive to light.  Neck: Normal range of motion. Neck supple.  Cardiovascular: Normal rate, normal heart sounds and intact distal pulses.   Pulmonary/Chest: Effort normal and breath sounds normal. She exhibits tenderness.  Abdominal: Bowel sounds are normal. She exhibits no distension. There is no tenderness.  Musculoskeletal: Normal range of motion. She exhibits no edema and no tenderness.  Neurological: She is alert and oriented to person, place, and time. She has normal strength. No cranial nerve deficit or sensory deficit.  Skin: Skin is warm and dry. No rash noted.  Psychiatric: She has a normal mood and affect.    ED Course  Procedures (including critical care  time) Labs Reviewed  BASIC METABOLIC PANEL - Abnormal; Notable for the following:    Glucose, Bld 217 (*)    GFR calc non Af Amer 58 (*)    GFR calc Af Amer 68 (*)    All other components within normal limits  BASIC METABOLIC PANEL - Abnormal; Notable for the following:    Sodium 134 (*)    Glucose, Bld 274 (*)    GFR calc non Af Amer 74 (*)    GFR calc Af Amer 86 (*)    All other components within normal limits  HEMOGLOBIN A1C - Abnormal; Notable for the following:    Hemoglobin A1C 9.2 (*)    Mean Plasma Glucose 217 (*)    All other components within normal limits  GLUCOSE, CAPILLARY - Abnormal; Notable for the following:    Glucose-Capillary 134 (*)    All other components within normal limits  GLUCOSE, CAPILLARY - Abnormal; Notable for the following:    Glucose-Capillary 297 (*)    All other components within normal limits  TROPONIN I  CBC  TROPONIN I  TROPONIN I  CBC  TSH   Dg Chest 2 View  04/02/2013   *RADIOLOGY REPORT*  Clinical Data: Chest pain  CHEST - 2 VIEW  Comparison: 10/28/2007  Findings: Heart size appears normal. Tortuous unfolded thoracic aorta containing atherosclerotic calcifications noted.  No pleural effusion or edema.  No airspace consolidation.  There is mild spondylosis within the thoracic spine.  IMPRESSION:  1.  No acute cardiopulmonary abnormalities.   Original Report Authenticated By: Kerby Moors, M.D.   1. Chest pain   2. Atypical chest pain   3. Chronic bronchitis with acute exacerbation   4. Type II or unspecified type diabetes mellitus without mention of complication, not stated as uncontrolled     MDM   Date: 04/02/2013  Rate: 87  Rhythm: normal sinus rhythm  QRS Axis: normal  Intervals: normal  ST/T Wave abnormalities: nonspecific T wave changes  Conduction Disutrbances:none  Narrative Interpretation:   Old EKG Reviewed: changes noted, T wave flattening compared to 2009  Pt with atypical symptoms, but high risk factor profile.  Admit for rule out.    Cristino Degroff B. Karle Starch, MD 04/03/13 [phone]

## 2013-04-03 DIAGNOSIS — F17200 Nicotine dependence, unspecified, uncomplicated: Secondary | ICD-10-CM

## 2013-04-03 LAB — CBC
MCH: 30.7 pg (ref 26.0–34.0)
Platelets: 252 10*3/uL (ref 150–400)
RBC: 4.07 MIL/uL (ref 3.87–5.11)
RDW: 13.2 % (ref 11.5–15.5)
WBC: 9.8 10*3/uL (ref 4.0–10.5)

## 2013-04-03 LAB — BASIC METABOLIC PANEL
Calcium: 9.2 mg/dL (ref 8.4–10.5)
Creatinine, Ser: 0.83 mg/dL (ref 0.50–1.10)
GFR calc non Af Amer: 74 mL/min — ABNORMAL LOW (ref >90)
Glucose, Bld: 274 mg/dL — ABNORMAL HIGH (ref 70–99)
Sodium: 134 mEq/L — ABNORMAL LOW (ref 135–145)

## 2013-04-03 LAB — HEMOGLOBIN A1C: Hgb A1c MFr Bld: 9.2 % — ABNORMAL HIGH (ref <5.7)

## 2013-04-03 LAB — GLUCOSE, CAPILLARY: Glucose-Capillary: 276 mg/dL — ABNORMAL HIGH (ref 70–99)

## 2013-04-03 MED ORDER — ALBUTEROL SULFATE HFA 108 (90 BASE) MCG/ACT IN AERS: 2.0000 | INHALATION_SPRAY | RESPIRATORY_TRACT | Status: DC | PRN

## 2013-04-03 MED ORDER — INSULIN LISPRO PROT & LISPRO (75-25 MIX) 100 UNIT/ML ~~LOC~~ SUSP: 25.0000 [IU] | Freq: Two times a day (BID) | SUBCUTANEOUS | Status: DC

## 2013-04-03 MED ORDER — PREDNISONE 20 MG PO TABS: 20.0000 mg | ORAL_TABLET | Freq: Every day | ORAL | Status: DC

## 2013-04-03 MED ORDER — METFORMIN HCL 500 MG PO TABS
500.0000 mg | ORAL_TABLET | Freq: Two times a day (BID) | ORAL | Status: DC
  Administered 2013-04-03: 500 mg via ORAL
  Filled 2013-04-03: qty 1

## 2013-04-03 MED ORDER — ALBUTEROL SULFATE HFA 108 (90 BASE) MCG/ACT IN AERS
2.0000 | INHALATION_SPRAY | Freq: Four times a day (QID) | RESPIRATORY_TRACT | Status: DC
  Administered 2013-04-03: 2 via RESPIRATORY_TRACT
  Filled 2013-04-03: qty 6.7

## 2013-04-03 MED ORDER — HYDROCOD POLST-CHLORPHEN POLST 10-8 MG/5ML PO LQCR: 5.0000 mL | Freq: Every evening | ORAL | Status: DC | PRN

## 2013-04-03 MED ORDER — BENZONATATE 100 MG PO CAPS: 100.0000 mg | ORAL_CAPSULE | Freq: Three times a day (TID) | ORAL | Status: DC | PRN

## 2013-04-03 MED ORDER — AZITHROMYCIN 250 MG PO TABS: ORAL_TABLET | ORAL | Status: DC

## 2013-04-03 MED ORDER — OXYCODONE-ACETAMINOPHEN 2.5-325 MG PO TABS: 1.0000 | ORAL_TABLET | ORAL | Status: DC | PRN

## 2013-04-03 NOTE — Progress Notes (Signed)
PT Cancellation Note  Patient Details Name: [name] MRN: [id number] DOB: 04/28/1951   Cancelled Treatment:    Reason Eval/Treat Not Completed: PT screened, no needs identified, will sign off   Sable Feil 04/03/2013, 9:41 AM

## 2013-04-03 NOTE — Discharge Summary (Signed)
Physician Discharge Summary  [name] [id number] DOB: 1951/04/22 DOA: 04/02/2013  PCP: Maggie Font, MD  Admit date: 04/02/2013 Discharge date: 04/03/2013  Time spent: Greater than 30 minutes  Recommendations for Outpatient Follow-up:  1. The patient was advised to followup with Dr. Berdine Addison scheduled. She was instructed to stop smoking.  Discharge Diagnoses:   1. Atypical chest pain secondary to chest wall pain and coughing. 2. Chronic bronchitis with acute exacerbation. 3. Type 2 diabetes mellitus. 4. Hypertension. 5. Tobacco abuse. The patient was strongly advised to stop smoking. 6. Obesity.  Discharge Condition: Improved.  Diet recommendation: Heart healthy and carbohydrate modified.  Filed Weights   04/02/13 1336  Weight: 115.667 kg (255 lb)    History of present illness:  The patient is a 62 year old woman with a history significant for peptic ulcer disease, diabetes mellitus, hypertension, and chronic bronchitis, who presented to the emergency department on 04/02/2013 with a chief complaint of left-sided chest pain. She has been having a nonproductive cough for 2 weeks. The pain was positional and worsened with movement. She has a history of cardiac evaluations in the past, one with Dr. Montez Morita approximately 25 years ago and another by [facility] cardiology 15 years ago. Per her history, both of the workups were negative. In the emergency department, she was afebrile and hemodynamically stable. She was oxygenating 97% on room air. Her pulse rate was in the 80s to 90s. Her troponin I was negative. Her glucose was 217. Her chest x-ray revealed no acute cardiopulmonary abnormalities. Her EKG reveals normal sinus rhythm with a heart rate of 87 beats per minute and nonspecific T-wave abnormalities. She was admitted for further evaluation and management.  Hospital Course:  The patient had coarse breath sounds and rare wheezes on exam. It was believed that her chest pain was  musculoskeletal in nature from 2 weeks of coughing from mild acute exacerbation of her bronchitis. She was given IV Solu-Medrol in the emergency department. The following morning, prednisone was initiated. Azithromycin was given empirically for antibiotic coverage. Albuterol and Atrovent nebulizers were started. She was strongly advised to stop smoking. She refused a nicotine patch. She was started on aspirin empirically. She was given a GI cocktail and Protonix empirically. Additional analgesics were ordered as needed for pain. She was continued on her home medications. In addition to 70/30 insulin, sliding-scale NovoLog was initiated in the setting of slightly uncontrolled diabetes with steroid treatment. For further evaluation, a number of studies were ordered. Her troponin I was negative x3. Her hemoglobin A1c was 9.2. Her TSH was 1.07.  Following treatment, the patient became less symptomatic. She had no complaints of chest pain or wheezing or chest congestion at the time of discharge. She was discharged on prednisone, azithromycin, and albuterol inhaler. She was again advised to stop smoking.    Procedures:  None  Consultations:  None  Discharge Exam: Filed Vitals:   04/02/13 2333 04/03/13 0603 04/03/13 0730 04/03/13 1215  BP:  161/77    Pulse:  72    Temp:  97.8 F (36.6 C)    TempSrc:  Oral    Resp:  18    Height:      Weight:      SpO2: 92% 99% 95% 96%    General: Pleasant obese 62 year old African-American woman sitting up in bed, in no acute distress. Cardiovascular: S1, S2, with no murmurs rubs or gallops. Respiratory: Clear to auscultation bilaterally. No wheezes or crackles. Breathing is nonlabored. Extremities: No pedal edema.  Discharge Instructions  Discharge Orders   Future Orders Complete By Expires     Diet - low sodium heart healthy  As directed     Diet Carb Modified  As directed     Discharge instructions  As directed     Comments:      Stop smoking.     Increase activity slowly  As directed         Medication List    STOP taking these medications       ibuprofen 200 MG tablet  Commonly known as:  ADVIL,MOTRIN      TAKE these medications       albuterol 108 (90 BASE) MCG/ACT inhaler  Commonly known as:  PROVENTIL HFA;VENTOLIN HFA  Inhale 2 puffs into the lungs every 4 (four) hours as needed for wheezing or shortness of breath.     amitriptyline 50 MG tablet  Commonly known as:  ELAVIL  Take 50 mg by mouth at bedtime.     aspirin EC 81 MG tablet  Take 81 mg by mouth daily.     atenolol 50 MG tablet  Commonly known as:  TENORMIN  Take 50 mg by mouth daily.     azithromycin 250 MG tablet  Commonly known as:  ZITHROMAX  Starting tomorrow, take 2 tablets for 1 day. Then take 1 tablet daily thereafter until completed. (Antibiotic).     benzonatate 100 MG capsule  Commonly known as:  TESSALON  Take 1 capsule (100 mg total) by mouth 3 (three) times daily as needed for cough.     chlorpheniramine-HYDROcodone 10-8 MG/5ML Lqcr  Commonly known as:  TUSSIONEX  Take 5 mLs by mouth at bedtime as needed.     insulin lispro protamine-lispro (75-25) 100 UNIT/ML Susp  Commonly known as:  HUMALOG 75/25  Inject 25-45 Units into the skin 2 (two) times daily with a meal. The dosing has been increased: take 45 units in the morning and 25 units in the evening.     lisinopril 10 MG tablet  Commonly known as:  PRINIVIL,ZESTRIL  Take 10 mg by mouth daily.     metFORMIN 500 MG tablet  Commonly known as:  GLUCOPHAGE  Take 1,000 mg by mouth 2 (two) times daily with a meal.     oxycodone-acetaminophen 2.5-325 MG per tablet  Commonly known as:  PERCOCET  Take 1 tablet by mouth every 4 (four) hours as needed for pain.     predniSONE 20 MG tablet  Commonly known as:  DELTASONE  Take 1 tablet (20 mg total) by mouth daily with breakfast. Starting tomorrow, take for 5 more days.     rosuvastatin 5 MG tablet  Commonly known as:  CRESTOR   Take 5 mg by mouth at bedtime.     triamterene-hydrochlorothiazide 37.5-25 MG per tablet  Commonly known as:  MAXZIDE-25  Take 1 tablet by mouth daily.     verapamil 240 MG (CO) 24 hr tablet  Commonly known as:  COVERA HS  Take 240 mg by mouth every morning.       Allergies  Allergen Reactions  . Gabapentin     REACTION: suicidal thought       Follow-up Information   Follow up with HILL,GERALD K, MD. (Followup next week as scheduled.)    Contact information:   [address] [place] [address] [phone]        The results of significant diagnostics from this hospitalization (including imaging, microbiology, ancillary and  laboratory) are listed below for reference.    Significant Diagnostic Studies: Dg Chest 2 View  04/02/2013   *RADIOLOGY REPORT*  Clinical Data: Chest pain  CHEST - 2 VIEW  Comparison: 10/28/2007  Findings: Heart size appears normal. Tortuous unfolded thoracic aorta containing atherosclerotic calcifications noted.  No pleural effusion or edema.  No airspace consolidation.  There is mild spondylosis within the thoracic spine.  IMPRESSION:  1.  No acute cardiopulmonary abnormalities.   Original Report Authenticated By: Kerby Moors, M.D.    Microbiology: No results found for this or any previous visit (from the past 240 hour(s)).   Labs: Basic Metabolic Panel:  Recent Labs Lab 04/02/13 1349 04/03/13 0556  NA 137 134*  K 3.6 4.4  CL 100 97  CO2 28 27  GLUCOSE 217* 274*  BUN 18 19  CREATININE 1.01 0.83  CALCIUM 9.3 9.2   Liver Function Tests: No results found for this basename: AST, ALT, ALKPHOS, BILITOT, PROT, ALBUMIN,  in the last 168 hours No results found for this basename: LIPASE, AMYLASE,  in the last 168 hours No results found for this basename: AMMONIA,  in the last 168 hours CBC:  Recent Labs Lab 04/02/13 1349 04/03/13 0556  WBC 8.9 9.8  HGB 12.7 12.5  HCT 37.7 36.8  MCV 91.5 90.4  PLT 261 252   Cardiac  Enzymes:  Recent Labs Lab 04/02/13 1349 04/02/13 1720 04/03/13 0556  TROPONINI <0.30 <0.30 <0.30   BNP: BNP (last 3 results) No results found for this basename: PROBNP,  in the last 8760 hours CBG:  Recent Labs Lab 04/02/13 1726 04/02/13 2052 04/03/13 0800 04/03/13 1120  GLUCAP 134* 297* 265* 276*       Signed:  Leonela Kivi  [facility] 04/03/2013, 5:54 PM

## 2013-04-03 NOTE — Progress Notes (Signed)
UR chart review completed.  

## 2013-04-03 NOTE — Progress Notes (Signed)
Pt a/o.vss. Saline lock removed. No complaints of any distress.discharge instructions and prescriptions given. Pt verbalized understanding of instructions. Pt left floor via wheelchair with nursing staff.

## 2013-06-17 ENCOUNTER — Other Ambulatory Visit (HOSPITAL_COMMUNITY): Payer: Self-pay | Admitting: Family Medicine

## 2013-06-17 DIAGNOSIS — Z139 Encounter for screening, unspecified: Secondary | ICD-10-CM

## 2013-07-24 ENCOUNTER — Other Ambulatory Visit: Payer: Self-pay

## 2013-07-28 ENCOUNTER — Ambulatory Visit (HOSPITAL_COMMUNITY)
Admission: RE | Admit: 2013-07-28 | Discharge: 2013-07-28 | Disposition: A | Discharge status: Home / Self Care | Payer: Medicare Other | Source: Ambulatory Visit | Attending: Family Medicine | Admitting: Family Medicine

## 2013-07-28 DIAGNOSIS — Z139 Encounter for screening, unspecified: Secondary | ICD-10-CM

## 2013-07-28 DIAGNOSIS — Z1231 Encounter for screening mammogram for malignant neoplasm of breast: Secondary | ICD-10-CM | POA: Insufficient documentation

## 2014-04-02 ENCOUNTER — Encounter: Payer: Self-pay | Admitting: Gastroenterology

## 2014-04-02 ENCOUNTER — Other Ambulatory Visit: Payer: Self-pay | Admitting: Internal Medicine

## 2014-04-02 ENCOUNTER — Ambulatory Visit (INDEPENDENT_AMBULATORY_CARE_PROVIDER_SITE_OTHER): Payer: Medicare Other | Admitting: Gastroenterology

## 2014-04-02 VITALS — BP 170/82 | HR 75 | Temp 97.7°F | Ht 65.0 in | Wt 249.4 lb

## 2014-04-02 DIAGNOSIS — R1319 Other dysphagia: Secondary | ICD-10-CM

## 2014-04-02 DIAGNOSIS — R1310 Dysphagia, unspecified: Secondary | ICD-10-CM | POA: Insufficient documentation

## 2014-04-02 DIAGNOSIS — K5900 Constipation, unspecified: Secondary | ICD-10-CM | POA: Insufficient documentation

## 2014-04-02 DIAGNOSIS — Z1211 Encounter for screening for malignant neoplasm of colon: Secondary | ICD-10-CM

## 2014-04-02 MED ORDER — LINACLOTIDE 145 MCG PO CAPS: 145.0000 ug | ORAL_CAPSULE | Freq: Every day | ORAL | Status: DC

## 2014-04-02 MED ORDER — PEG 3350-KCL-NA BICARB-NACL 420 G PO SOLR: 4000.0000 mL | ORAL | Status: DC

## 2014-04-02 NOTE — Patient Instructions (Signed)
For constipation: start taking Linzess 1 capsule each morning, 30 minutes before breakfast. I have provided samples and sent the prescription to your pharmacy.   We have scheduled you for a colonoscopy, upper endoscopy and dilation with Dr. Gala Romney in the near future!

## 2014-04-02 NOTE — Progress Notes (Signed)
Primary Care Physician:  Maggie Font, MD Primary Gastroenterologist:  Dr. Gala Romney  Chief Complaint  Patient presents with  . Colonoscopy    HPI:   [name] presents today for evaluation prior to colonoscopy. She has a personal history of ovarian cancer in the remote past. Last colonoscopy in 2007, and she was slated for 5 year screening. No rectal bleeding. Some constipation. Stool real hard. No abdominal pain. No unexplained weight loss or lack of appetite. Notes lots of coughing, voice becoming hoarse, weaker. Denies typical reflux. Some mild epigastric discomfort with eating at times. Occasional solid food dysphagia. No pill dysphagia. Was taking Ibuprofen routinely until last week due to lower back pain.    Past Medical History  Diagnosis Date  . Diabetes mellitus   . Hypertension   . Cancer     ovarian    Past Surgical History  Procedure Laterality Date  . Replacement total knee      bilateral  . Cholecystectomy    . Abdominal hysterectomy    . Colonoscopy  2007    Dr. Gala Romney: internal hemorrhoids, few scattered sigmoid diverticula  . Esophagogastroduodenoscopy  2008    Dr. Gala Romney: erosive esophagitis, patulous EG junction    Current Outpatient Prescriptions  Medication Sig Dispense Refill  . albuterol (PROVENTIL HFA;VENTOLIN HFA) 108 (90 BASE) MCG/ACT inhaler Inhale 2 puffs into the lungs every 4 (four) hours as needed for wheezing or shortness of breath.  1 Inhaler  1  . aspirin EC 81 MG tablet Take 81 mg by mouth daily.      Marland Kitchen atenolol (TENORMIN) 50 MG tablet Take 50 mg by mouth daily.      . insulin lispro protamine-lispro (HUMALOG 75/25 MIX) (75-25) 100 UNIT/ML SUSP injection Inject 25-45 Units into the skin 2 (two) times daily with a meal. Takes 42 units in the Am      . lisinopril (PRINIVIL,ZESTRIL) 10 MG tablet Take 10 mg by mouth daily.      Marland Kitchen triamterene-hydrochlorothiazide (MAXZIDE-25) 37.5-25 MG per tablet Take 1 tablet by mouth daily.      .  verapamil (COVERA HS) 240 MG (CO) 24 hr tablet Take 240 mg by mouth every morning.      . ezetimibe (ZETIA) 10 MG tablet Take 10 mg by mouth daily.      . polyethylene glycol-electrolytes (TRILYTE) 420 G solution Take 4,000 mLs by mouth as directed.  4000 mL  0  . rosuvastatin (CRESTOR) 5 MG tablet Take 5 mg by mouth at bedtime.       No current facility-administered medications for this visit.    Allergies as of 04/02/2014 - Review Complete 04/02/2014  Allergen Reaction Noted  . Gabapentin      Family History  Problem Relation Age of Onset  . Colon cancer Neg Hx     History   Social History  . Marital Status: Widowed    Spouse Name: N/A    Number of Children: N/A  . Years of Education: N/A   Occupational History  . Not on file.   Social History Main Topics  . Smoking status: Current Every Day Smoker -- 1.00 packs/day    Types: Cigarettes  . Smokeless tobacco: Not on file     Comment: Smokes one pack of cigarettes daily  . Alcohol Use: No  . Drug Use: No  . Sexual Activity: Not on file   Other Topics Concern  . Not on file   Social History Narrative  .  No narrative on file    Review of Systems: Gen: Denies any fever, chills, fatigue, weight loss, lack of appetite.  CV: Denies chest pain, heart palpitations, peripheral edema, syncope.  Resp: +cough GI: see HPI GU : Denies urinary burning, urinary frequency, urinary hesitancy MS: back pain Derm: Denies rash, itching, dry skin Psych: Denies depression, anxiety, memory loss, and confusion Heme: Denies bruising, bleeding, and enlarged lymph nodes.  Physical Exam: BP 170/82  Pulse 75  Temp(Src) 97.7 F (36.5 C) (Oral)  Ht 5\' 5"  (1.651 m)  Wt 249 lb 6.4 oz (113.127 kg)  BMI 41.50 kg/m2 General:   Alert and oriented. Pleasant and cooperative. Well-nourished and well-developed.  Head:  Normocephalic and atraumatic. Eyes:  Without icterus, sclera clear and conjunctiva pink.  Ears:  Normal auditory  acuity. Nose:  No deformity, discharge,  or lesions. Mouth:  No deformity or lesions, oral mucosa pink.  Neck:  Supple, without mass or thyromegaly. Lungs:  Clear to auscultation bilaterally. No wheezes, rales, or rhonchi. No distress.  Heart:  S1, S2 present without murmurs appreciated.  Abdomen:  +BS, soft, non-tender and non-distended. No HSM noted. No guarding or rebound. No masses appreciated.  Rectal:  Deferred  Msk:  Symmetrical without gross deformities. Normal posture. Extremities:  Without clubbing or edema. Neurologic:  Alert and  oriented x4;  grossly normal neurologically. Skin:  Intact without significant lesions or rashes. Cervical Nodes:  No significant cervical adenopathy. Psych:  Alert and cooperative. Normal mood and affect.

## 2014-04-06 ENCOUNTER — Encounter (HOSPITAL_COMMUNITY): Payer: Self-pay | Admitting: Pharmacy Technician

## 2014-04-07 ENCOUNTER — Encounter: Payer: Self-pay | Admitting: Gastroenterology

## 2014-04-07 DIAGNOSIS — Z1211 Encounter for screening for malignant neoplasm of colon: Secondary | ICD-10-CM | POA: Insufficient documentation

## 2014-04-07 NOTE — Assessment & Plan Note (Signed)
Occasional solid food dysphagia. No PPI currently. Query esophagitis, web, ring, or stricture. Proceed with EGD/ED at time of colonoscopy.

## 2014-04-07 NOTE — Assessment & Plan Note (Signed)
63 year old female with need for screening colonoscopy, slated to have every 5 years, personal history of ovarian cancer in remote past. Last colonoscopy in 2007.   Proceed with TCS with Dr. Gala Romney in near future: the risks, benefits, and alternatives have been discussed with the patient in detail. The patient states understanding and desires to proceed.

## 2014-04-07 NOTE — Assessment & Plan Note (Signed)
Intermittent constipation, no rectal bleeding. Needs more aggressive regimen. Start Linzess 145 mcg daily.

## 2014-04-09 ENCOUNTER — Encounter (HOSPITAL_COMMUNITY): Payer: Self-pay | Admitting: *Deleted

## 2014-04-09 ENCOUNTER — Ambulatory Visit (HOSPITAL_COMMUNITY)
Admission: RE | Admit: 2014-04-09 | Discharge: 2014-04-09 | Disposition: A | Discharge status: Home / Self Care | Payer: Medicare Other | Source: Ambulatory Visit | Attending: Internal Medicine | Admitting: Internal Medicine

## 2014-04-09 ENCOUNTER — Encounter (HOSPITAL_COMMUNITY)
Admission: RE | Disposition: A | Discharge status: Home / Self Care | Payer: Self-pay | Source: Ambulatory Visit | Attending: Internal Medicine

## 2014-04-09 DIAGNOSIS — K449 Diaphragmatic hernia without obstruction or gangrene: Secondary | ICD-10-CM | POA: Diagnosis not present

## 2014-04-09 DIAGNOSIS — Z8543 Personal history of malignant neoplasm of ovary: Secondary | ICD-10-CM | POA: Insufficient documentation

## 2014-04-09 DIAGNOSIS — R1314 Dysphagia, pharyngoesophageal phase: Secondary | ICD-10-CM | POA: Insufficient documentation

## 2014-04-09 DIAGNOSIS — Z794 Long term (current) use of insulin: Secondary | ICD-10-CM | POA: Insufficient documentation

## 2014-04-09 DIAGNOSIS — Z96659 Presence of unspecified artificial knee joint: Secondary | ICD-10-CM | POA: Insufficient documentation

## 2014-04-09 DIAGNOSIS — R1310 Dysphagia, unspecified: Secondary | ICD-10-CM

## 2014-04-09 DIAGNOSIS — K5900 Constipation, unspecified: Secondary | ICD-10-CM

## 2014-04-09 DIAGNOSIS — R1319 Other dysphagia: Secondary | ICD-10-CM

## 2014-04-09 DIAGNOSIS — E119 Type 2 diabetes mellitus without complications: Secondary | ICD-10-CM | POA: Diagnosis not present

## 2014-04-09 DIAGNOSIS — Z1211 Encounter for screening for malignant neoplasm of colon: Secondary | ICD-10-CM | POA: Insufficient documentation

## 2014-04-09 DIAGNOSIS — Q438 Other specified congenital malformations of intestine: Secondary | ICD-10-CM | POA: Insufficient documentation

## 2014-04-09 DIAGNOSIS — K2100 Gastro-esophageal reflux disease with esophagitis, without bleeding: Secondary | ICD-10-CM | POA: Diagnosis not present

## 2014-04-09 DIAGNOSIS — D126 Benign neoplasm of colon, unspecified: Secondary | ICD-10-CM | POA: Diagnosis not present

## 2014-04-09 DIAGNOSIS — K5730 Diverticulosis of large intestine without perforation or abscess without bleeding: Secondary | ICD-10-CM | POA: Diagnosis not present

## 2014-04-09 DIAGNOSIS — K222 Esophageal obstruction: Secondary | ICD-10-CM | POA: Diagnosis not present

## 2014-04-09 DIAGNOSIS — I10 Essential (primary) hypertension: Secondary | ICD-10-CM | POA: Insufficient documentation

## 2014-04-09 DIAGNOSIS — Z7982 Long term (current) use of aspirin: Secondary | ICD-10-CM | POA: Diagnosis not present

## 2014-04-09 DIAGNOSIS — Z79899 Other long term (current) drug therapy: Secondary | ICD-10-CM | POA: Diagnosis not present

## 2014-04-09 HISTORY — PX: MALONEY DILATION: SHX5535

## 2014-04-09 HISTORY — PX: COLONOSCOPY: SHX5424

## 2014-04-09 HISTORY — PX: ESOPHAGOGASTRODUODENOSCOPY: SHX5428

## 2014-04-09 HISTORY — PX: SAVORY DILATION: SHX5439

## 2014-04-09 LAB — GLUCOSE, CAPILLARY: GLUCOSE-CAPILLARY: 182 mg/dL — AB (ref 70–99)

## 2014-04-09 SURGERY — COLONOSCOPY
Anesthesia: Moderate Sedation

## 2014-04-09 MED ORDER — LIDOCAINE VISCOUS 2 % MT SOLN
OROMUCOSAL | Status: DC | PRN
  Administered 2014-04-09: 3 mL via OROMUCOSAL

## 2014-04-09 MED ORDER — ONDANSETRON HCL 4 MG/2ML IJ SOLN
INTRAMUSCULAR | Status: AC
  Filled 2014-04-09: qty 2

## 2014-04-09 MED ORDER — MEPERIDINE HCL 100 MG/ML IJ SOLN
INTRAMUSCULAR | Status: DC | PRN
  Administered 2014-04-09: 50 mg via INTRAVENOUS
  Administered 2014-04-09: 25 mg via INTRAVENOUS
  Administered 2014-04-09: 50 mg via INTRAVENOUS

## 2014-04-09 MED ORDER — SODIUM CHLORIDE 0.9 % IV SOLN
INTRAVENOUS | Status: DC
  Administered 2014-04-09: 13:00:00 via INTRAVENOUS

## 2014-04-09 MED ORDER — ONDANSETRON HCL 4 MG/2ML IJ SOLN
INTRAMUSCULAR | Status: DC | PRN
  Administered 2014-04-09: 4 mg via INTRAVENOUS

## 2014-04-09 MED ORDER — MIDAZOLAM HCL 5 MG/5ML IJ SOLN
INTRAMUSCULAR | Status: DC | PRN
  Administered 2014-04-09: 1 mg via INTRAVENOUS
  Administered 2014-04-09: 2 mg via INTRAVENOUS
  Administered 2014-04-09: 1 mg via INTRAVENOUS
  Administered 2014-04-09: 2 mg via INTRAVENOUS

## 2014-04-09 MED ORDER — STERILE WATER FOR IRRIGATION IR SOLN
Status: DC | PRN
  Administered 2014-04-09: 14:00:00

## 2014-04-09 MED ORDER — MEPERIDINE HCL 100 MG/ML IJ SOLN
INTRAMUSCULAR | Status: AC
  Filled 2014-04-09: qty 2

## 2014-04-09 MED ORDER — MIDAZOLAM HCL 5 MG/5ML IJ SOLN
INTRAMUSCULAR | Status: AC
  Filled 2014-04-09: qty 10

## 2014-04-09 MED ORDER — LIDOCAINE VISCOUS 2 % MT SOLN
OROMUCOSAL | Status: AC
  Filled 2014-04-09: qty 15

## 2014-04-09 NOTE — Op Note (Signed)
[facility] [address] [place], [address]   ENDOSCOPY PROCEDURE REPORT  PATIENT: [name], [name]  MR#: [id number] BIRTHDATE: 1950/09/23 , 62  yrs. old GENDER: Female ENDOSCOPIST: R.  Garfield Cornea, MD FACP [facility] REFERRED BY:  [name], M.D. PROCEDURE DATE:  04/09/2014 PROCEDURE:     EGD with Venia Minks dilation followed by gastric biopsy  INDICATIONS:    Esophageal dysphagia; GERD  INFORMED CONSENT:   The risks, benefits, limitations, alternatives and imponderables have been discussed.  The potential for biopsy, esophogeal dilation, etc. have also been reviewed.  Questions have been answered.  All parties agreeable.  Please see the history and physical in the medical record for more information.  MEDICATIONS:   Versed 5 mg IV and Demerol 100 mg IV in divided doses. Xylocaine gel orally. Zofran 4 mg IV  DESCRIPTION OF PROCEDURE:   The [id number]  endoscope was introduced through the mouth and advanced to the second portion of the duodenum without difficulty or limitations.  The mucosal surfaces were surveyed very carefully during advancement of the scope and upon withdrawal.  Retroflexion view of the proximal stomach and esophagogastric junction was performed.      FINDINGS:   Schatzki's ring present with overlying distal esophageal erosions. No tumor. No Barrett's esophagus. Stomach empty. Small hiatal hernia. Scattered gastric erosions. No ulcer or infiltrating process. Patent pylorus. Normal first and second portion of the duodenum  THERAPEUTIC / DIAGNOSTIC MANEUVERS PERFORMED:  A 54 French Maloney dilator was passed to full insertion easily. A look back revealed no apparent complication related to this maneuver. Subsequently, biopsies abnormal gastric mucosa taken for histologic study   COMPLICATIONS:  None  IMPRESSION:   Erosive reflux esophagitis. Schatzki's ring-status post dilation as described above. Hiatal hernia.  Gastric erosions-status post gastric biopsy.  RECOMMENDATIONS:  Begin Protonix  40 mg daily. Followup on pathology. See colonoscopy report.    _______________________________ R. Garfield Cornea, MD FACP [facility] eSigned:  R. Garfield Cornea, MD FACP [facility] 04/09/2014 3:00 PM     CC:

## 2014-04-09 NOTE — Op Note (Signed)
[facility] [address] [place], [address]   COLONOSCOPY PROCEDURE REPORT  PATIENT: Hannah, Welch  MR#:         [id number] BIRTHDATE: 10/03/1950 , 62  yrs. old GENDER: Female ENDOSCOPIST: R.  Garfield Cornea, MD FACP [facility] REFERRED BY:  [name], M.D. PROCEDURE DATE:  04/09/2014 PROCEDURE:     Colonoscopy with snare polypectomy  INDICATIONS: Average risk Colorectal cancer screening  INFORMED CONSENT:  The risks, benefits, alternatives and imponderables including but not limited to bleeding, perforation as well as the possibility of a missed lesion have been reviewed.  The potential for biopsy, lesion removal, etc. have also been discussed.  Questions have been answered.  All parties agreeable. Please see the history and physical in the medical record for more information.  MEDICATIONS: Versed 6 mg IV and Demerol 125 mg IV in divided doses. Stop Zofran 4 mg a  DESCRIPTION OF PROCEDURE:  After a digital rectal exam was performed, the [id number]  colonoscope was advanced from the anus through the rectum and colon to the area of the cecum, ileocecal valve and appendiceal orifice.  The cecum was deeply intubated.  These structures were well-seen and photographed for the record.  From the level of the cecum and ileocecal valve, the scope was slowly and cautiously withdrawn.  The mucosal surfaces were carefully surveyed utilizing scope tip deflection to facilitate fold flattening as needed.  The scope was pulled down into the rectum where a thorough examination including retroflexion was performed.    FINDINGS:  Adequate preparation. Normal rectum. Redundant colon. Left-sided diverticula; (1) 5 mm polyp in the mid descending segment; otherwise, the remainder of the colonic mucosa appeared normal.  THERAPEUTIC / DIAGNOSTIC MANEUVERS PERFORMED:  the above-mentioned polyp was hot snare removed.  COMPLICATIONS: none  CECAL WITHDRAWAL TIME:  11  minutes  IMPRESSION:  Colonic diverticulosis. Redundant colon. Single colonic polyp-removed as described above.  RECOMMENDATIONS: Followup on pathology. Begin Linzess as previously recommended. See EGD report.   _______________________________ eSigned:  R. Garfield Cornea, MD FACP [facility] 04/09/2014 3:05 PM   CC:

## 2014-04-09 NOTE — Interval H&P Note (Signed)
History and Physical Interval Note:  04/09/2014 2:00 PM  [name]  has presented today for surgery, with the diagnosis of CONSTIPATION AND DYSPHAGIA  The various methods of treatment have been discussed with the patient and family. After consideration of risks, benefits and other options for treatment, the patient has consented to  Procedure(s) with comments: COLONOSCOPY (N/A) - 2:45-rescheduled to 200 04/09/14 Leigh Ann notified pt ESOPHAGOGASTRODUODENOSCOPY (EGD) (N/A) SAVORY DILATION (N/A) MALONEY DILATION (N/A) as a surgical intervention .  The patient's history has been reviewed, patient examined, no change in status, stable for surgery.  I have reviewed the patient's chart and labs.  Questions were answered to the patient's satisfaction.     Londan Coplen  No change. Dysphagia to solids as reported. EGD with esophageal dilation appropriate and colonoscopy for screening today per plan.  The risks, benefits, limitations, imponderables and alternatives regarding both EGD and colonoscopy have been reviewed with the patient. Questions have been answered. All parties agreeable.

## 2014-04-09 NOTE — Discharge Instructions (Addendum)
EGD Discharge instructions Please read the instructions outlined below and refer to this sheet in the next few weeks. These discharge instructions provide you with general information on caring for yourself after you leave the hospital. Your doctor may also give you specific instructions. While your treatment has been planned according to the most current medical practices available, unavoidable complications occasionally occur. If you have any problems or questions after discharge, please call your doctor. ACTIVITY  You may resume your regular activity but move at a slower pace for the next 24 hours.   Take frequent rest periods for the next 24 hours.   Walking will help expel (get rid of) the air and reduce the bloated feeling in your abdomen.   No driving for 24 hours (because of the anesthesia (medicine) used during the test).   You may shower.   Do not sign any important legal documents or operate any machinery for 24 hours (because of the anesthesia used during the test).  NUTRITION  Drink plenty of fluids.   You may resume your normal diet.   Begin with a light meal and progress to your normal diet.   Avoid alcoholic beverages for 24 hours or as instructed by your caregiver.  MEDICATIONS  You may resume your normal medications unless your caregiver tells you otherwise.  WHAT YOU CAN EXPECT TODAY  You may experience abdominal discomfort such as a feeling of fullness or gas pains.  FOLLOW-UP  Your doctor will discuss the results of your test with you.  SEEK IMMEDIATE MEDICAL ATTENTION IF ANY OF THE FOLLOWING OCCUR:  Excessive nausea (feeling sick to your stomach) and/or vomiting.   Severe abdominal pain and distention (swelling).   Trouble swallowing.   Temperature over 101 F (37.8 C).   Rectal bleeding or vomiting of blood.    Colonoscopy Discharge Instructions  Read the instructions outlined below and refer to this sheet in the next few weeks. These  discharge instructions provide you with general information on caring for yourself after you leave the hospital. Your doctor may also give you specific instructions. While your treatment has been planned according to the most current medical practices available, unavoidable complications occasionally occur. If you have any problems or questions after discharge, call Dr. Gala Romney at [phone]. ACTIVITY  You may resume your regular activity, but move at a slower pace for the next 24 hours.   Take frequent rest periods for the next 24 hours.   Walking will help get rid of the air and reduce the bloated feeling in your belly (abdomen).   No driving for 24 hours (because of the medicine (anesthesia) used during the test).    Do not sign any important legal documents or operate any machinery for 24 hours (because of the anesthesia used during the test).  NUTRITION  Drink plenty of fluids.   You may resume your normal diet as instructed by your doctor.   Begin with a light meal and progress to your normal diet. Heavy or fried foods are harder to digest and may make you feel sick to your stomach (nauseated).   Avoid alcoholic beverages for 24 hours or as instructed.  MEDICATIONS  You may resume your normal medications unless your doctor tells you otherwise.  WHAT YOU CAN EXPECT TODAY  Some feelings of bloating in the abdomen.   Passage of more gas than usual.   Spotting of blood in your stool or on the toilet paper.  IF YOU HAD POLYPS REMOVED DURING THE  COLONOSCOPY:  No aspirin products for 7 days or as instructed.   No alcohol for 7 days or as instructed.   Eat a soft diet for the next 24 hours.  FINDING OUT THE RESULTS OF YOUR TEST Not all test results are available during your visit. If your test results are not back during the visit, make an appointment with your caregiver to find out the results. Do not assume everything is normal if you have not heard from your caregiver or the  medical facility. It is important for you to follow up on all of your test results.  SEEK IMMEDIATE MEDICAL ATTENTION IF:  You have more than a spotting of blood in your stool.   Your belly is swollen (abdominal distention).   You are nauseated or vomiting.   You have a temperature over 101.   You have abdominal pain or discomfort that is severe or gets worse throughout the day.     Polyp and diverticulosis information provided  Begin Linzess as previously recommended  GERD information provided  Begin Protonix 40 mg daily  Further recommendations to follow pending review of pathology report   Gastroesophageal Reflux Disease, Adult Gastroesophageal reflux disease (GERD) happens when acid from your stomach flows up into the esophagus. When acid comes in contact with the esophagus, the acid causes soreness (inflammation) in the esophagus. Over time, GERD may create small holes (ulcers) in the lining of the esophagus. CAUSES   Increased body weight. This puts pressure on the stomach, making acid rise from the stomach into the esophagus.  Smoking. This increases acid production in the stomach.  Drinking alcohol. This causes decreased pressure in the lower esophageal sphincter (valve or ring of muscle between the esophagus and stomach), allowing acid from the stomach into the esophagus.  Late evening meals and a full stomach. This increases pressure and acid production in the stomach.  A malformed lower esophageal sphincter. Sometimes, no cause is found. SYMPTOMS   Burning pain in the lower part of the mid-chest behind the breastbone and in the mid-stomach area. This may occur twice a week or more often.  Trouble swallowing.  Sore throat.  Dry cough.  Asthma-like symptoms including chest tightness, shortness of breath, or wheezing. DIAGNOSIS  Your caregiver may be able to diagnose GERD based on your symptoms. In some cases, X-rays and other tests may be done to check for  complications or to check the condition of your stomach and esophagus. TREATMENT  Your caregiver may recommend over-the-counter or prescription medicines to help decrease acid production. Ask your caregiver before starting or adding any new medicines.  HOME CARE INSTRUCTIONS   Change the factors that you can control. Ask your caregiver for guidance concerning weight loss, quitting smoking, and alcohol consumption.  Avoid foods and drinks that make your symptoms worse, such as:  Caffeine or alcoholic drinks.  Chocolate.  Peppermint or mint flavorings.  Garlic and onions.  Spicy foods.  Citrus fruits, such as oranges, lemons, or limes.  Tomato-based foods such as sauce, chili, salsa, and pizza.  Fried and fatty foods.  Avoid lying down for the 3 hours prior to your bedtime or prior to taking a nap.  Eat small, frequent meals instead of large meals.  Wear loose-fitting clothing. Do not wear anything tight around your waist that causes pressure on your stomach.  Raise the head of your bed 6 to 8 inches with wood blocks to help you sleep. Extra pillows will not help.  Only take  over-the-counter or prescription medicines for pain, discomfort, or fever as directed by your caregiver.  Do not take aspirin, ibuprofen, or other nonsteroidal anti-inflammatory drugs (NSAIDs). SEEK IMMEDIATE MEDICAL CARE IF:   You have pain in your arms, neck, jaw, teeth, or back.  Your pain increases or changes in intensity or duration.  You develop nausea, vomiting, or sweating (diaphoresis).  You develop shortness of breath, or you faint.  Your vomit is green, yellow, black, or looks like coffee grounds or blood.  Your stool is red, bloody, or black. These symptoms could be signs of other problems, such as heart disease, gastric bleeding, or esophageal bleeding. MAKE SURE YOU:   Understand these instructions.  Will watch your condition.  Will get help right away if you are not doing well  or get worse. Document Released: 06/14/2005 Document Revised: 11/27/2011 Document Reviewed: 03/24/2011 [facility] Patient Information 2015 [place], [place]. This information is not intended to replace advice given to you by your health care provider. Make sure you discuss any questions you have with your health care provider. Diverticulosis Diverticulosis is the condition that develops when small pouches (diverticula) form in the wall of your colon. Your colon, or large intestine, is where water is absorbed and stool is formed. The pouches form when the inside layer of your colon pushes through weak spots in the outer layers of your colon. CAUSES  No one knows exactly what causes diverticulosis. RISK FACTORS  Being older than 66. Your risk for this condition increases with age. Diverticulosis is rare in people younger than 40 years. By age 66, almost everyone has it.  Eating a low-fiber diet.  Being frequently constipated.  Being overweight.  Not getting enough exercise.  Smoking.  Taking over-the-counter pain medicines, like aspirin and ibuprofen. SYMPTOMS  Most people with diverticulosis do not have symptoms. DIAGNOSIS  Because diverticulosis often has no symptoms, health care providers often discover the condition during an exam for other colon problems. In many cases, a health care provider will diagnose diverticulosis while using a flexible scope to examine the colon (colonoscopy). TREATMENT  If you have never developed an infection related to diverticulosis, you may not need treatment. If you have had an infection before, treatment may include:  Eating more fruits, vegetables, and grains.  Taking a fiber supplement.  Taking a live bacteria supplement (probiotic).  Taking medicine to relax your colon. HOME CARE INSTRUCTIONS   Drink at least 6-8 glasses of water each day to prevent constipation.  Try not to strain when you have a bowel movement.  Keep all follow-up  appointments. If you have had an infection before:  Increase the fiber in your diet as directed by your health care provider or dietitian.  Take a dietary fiber supplement if your health care provider approves.  Only take medicines as directed by your health care provider. SEEK MEDICAL CARE IF:   You have abdominal pain.  You have bloating.  You have cramps.  You have not gone to the bathroom in 3 days. SEEK IMMEDIATE MEDICAL CARE IF:   Your pain gets worse.  Yourbloating becomes very bad.  You have a fever or chills, and your symptoms suddenly get worse.  You begin vomiting.  You have bowel movements that are bloody or black. MAKE SURE YOU:  Understand these instructions.  Will watch your condition.  Will get help right away if you are not doing well or get worse. Document Released: 06/01/2004 Document Revised: 09/09/2013 Document Reviewed: 07/30/2013 [employer] Patient Information  2015 [employer]. This information is not intended to replace advice given to you by your health care provider. Make sure you discuss any questions you have with your health care provider. Colon Polyps Polyps are lumps of extra tissue growing inside the body. Polyps can grow in the large intestine (colon). Most colon polyps are noncancerous (benign). However, some colon polyps can become cancerous over time. Polyps that are larger than a pea may be harmful. To be safe, caregivers remove and test all polyps. CAUSES  Polyps form when mutations in the genes cause your cells to grow and divide even though no more tissue is needed. RISK FACTORS There are a number of risk factors that can increase your chances of getting colon polyps. They include:  Being older than 50 years.  Family history of colon polyps or colon cancer.  Long-term colon diseases, such as colitis or Crohn disease.  Being overweight.  Smoking.  Being inactive.  Drinking too much alcohol. SYMPTOMS  Most small  polyps do not cause symptoms. If symptoms are present, they may include:  Blood in the stool. The stool may look dark red or black.  Constipation or diarrhea that lasts longer than 1 week. DIAGNOSIS People often do not know they have polyps until their caregiver finds them during a regular checkup. Your caregiver can use 4 tests to check for polyps:  Digital rectal exam. The caregiver wears gloves and feels inside the rectum. This test would find polyps only in the rectum.  Barium enema. The caregiver puts a liquid called barium into your rectum before taking X-rays of your colon. Barium makes your colon look white. Polyps are dark, so they are easy to see in the X-ray pictures.  Sigmoidoscopy. A thin, flexible tube (sigmoidoscope) is placed into your rectum. The sigmoidoscope has a light and tiny camera in it. The caregiver uses the sigmoidoscope to look at the last third of your colon.  Colonoscopy. This test is like sigmoidoscopy, but the caregiver looks at the entire colon. This is the most common method for finding and removing polyps. TREATMENT  Any polyps will be removed during a sigmoidoscopy or colonoscopy. The polyps are then tested for cancer. PREVENTION  To help lower your risk of getting more colon polyps:  Eat plenty of fruits and vegetables. Avoid eating fatty foods.  Do not smoke.  Avoid drinking alcohol.  Exercise every day.  Lose weight if recommended by your caregiver.  Eat plenty of calcium and folate. Foods that are rich in calcium include milk, cheese, and broccoli. Foods that are rich in folate include chickpeas, kidney beans, and spinach. HOME CARE INSTRUCTIONS Keep all follow-up appointments as directed by your caregiver. You may need periodic exams to check for polyps. SEEK MEDICAL CARE IF: You notice bleeding during a bowel movement. Document Released: 05/31/2004 Document Revised: 11/27/2011 Document Reviewed: 11/14/2011 [facility] Patient Information  2015 [place], [place]. This information is not intended to replace advice given to you by your health care provider. Make sure you discuss any questions you have with your health care provider.

## 2014-04-09 NOTE — H&P (View-Only) (Signed)
Primary Care Physician:  Maggie Font, MD Primary Gastroenterologist:  Dr. Gala Romney  Chief Complaint  Patient presents with  . Colonoscopy    HPI:   [name] presents today for evaluation prior to colonoscopy. She has a personal history of ovarian cancer in the remote past. Last colonoscopy in 2007, and she was slated for 5 year screening. No rectal bleeding. Some constipation. Stool real hard. No abdominal pain. No unexplained weight loss or lack of appetite. Notes lots of coughing, voice becoming hoarse, weaker. Denies typical reflux. Some mild epigastric discomfort with eating at times. Occasional solid food dysphagia. No pill dysphagia. Was taking Ibuprofen routinely until last week due to lower back pain.    Past Medical History  Diagnosis Date  . Diabetes mellitus   . Hypertension   . Cancer     ovarian    Past Surgical History  Procedure Laterality Date  . Replacement total knee      bilateral  . Cholecystectomy    . Abdominal hysterectomy    . Colonoscopy  2007    Dr. Gala Romney: internal hemorrhoids, few scattered sigmoid diverticula  . Esophagogastroduodenoscopy  2008    Dr. Gala Romney: erosive esophagitis, patulous EG junction    Current Outpatient Prescriptions  Medication Sig Dispense Refill  . albuterol (PROVENTIL HFA;VENTOLIN HFA) 108 (90 BASE) MCG/ACT inhaler Inhale 2 puffs into the lungs every 4 (four) hours as needed for wheezing or shortness of breath.  1 Inhaler  1  . aspirin EC 81 MG tablet Take 81 mg by mouth daily.      Marland Kitchen atenolol (TENORMIN) 50 MG tablet Take 50 mg by mouth daily.      . insulin lispro protamine-lispro (HUMALOG 75/25 MIX) (75-25) 100 UNIT/ML SUSP injection Inject 25-45 Units into the skin 2 (two) times daily with a meal. Takes 42 units in the Am      . lisinopril (PRINIVIL,ZESTRIL) 10 MG tablet Take 10 mg by mouth daily.      Marland Kitchen triamterene-hydrochlorothiazide (MAXZIDE-25) 37.5-25 MG per tablet Take 1 tablet by mouth daily.      .  verapamil (COVERA HS) 240 MG (CO) 24 hr tablet Take 240 mg by mouth every morning.      . ezetimibe (ZETIA) 10 MG tablet Take 10 mg by mouth daily.      . polyethylene glycol-electrolytes (TRILYTE) 420 G solution Take 4,000 mLs by mouth as directed.  4000 mL  0  . rosuvastatin (CRESTOR) 5 MG tablet Take 5 mg by mouth at bedtime.       No current facility-administered medications for this visit.    Allergies as of 04/02/2014 - Review Complete 04/02/2014  Allergen Reaction Noted  . Gabapentin      Family History  Problem Relation Age of Onset  . Colon cancer Neg Hx     History   Social History  . Marital Status: Widowed    Spouse Name: N/A    Number of Children: N/A  . Years of Education: N/A   Occupational History  . Not on file.   Social History Main Topics  . Smoking status: Current Every Day Smoker -- 1.00 packs/day    Types: Cigarettes  . Smokeless tobacco: Not on file     Comment: Smokes one pack of cigarettes daily  . Alcohol Use: No  . Drug Use: No  . Sexual Activity: Not on file   Other Topics Concern  . Not on file   Social History Narrative  .  No narrative on file    Review of Systems: Gen: Denies any fever, chills, fatigue, weight loss, lack of appetite.  CV: Denies chest pain, heart palpitations, peripheral edema, syncope.  Resp: +cough GI: see HPI GU : Denies urinary burning, urinary frequency, urinary hesitancy MS: back pain Derm: Denies rash, itching, dry skin Psych: Denies depression, anxiety, memory loss, and confusion Heme: Denies bruising, bleeding, and enlarged lymph nodes.  Physical Exam: BP 170/82  Pulse 75  Temp(Src) 97.7 F (36.5 C) (Oral)  Ht 5\' 5"  (1.651 m)  Wt 249 lb 6.4 oz (113.127 kg)  BMI 41.50 kg/m2 General:   Alert and oriented. Pleasant and cooperative. Well-nourished and well-developed.  Head:  Normocephalic and atraumatic. Eyes:  Without icterus, sclera clear and conjunctiva pink.  Ears:  Normal auditory  acuity. Nose:  No deformity, discharge,  or lesions. Mouth:  No deformity or lesions, oral mucosa pink.  Neck:  Supple, without mass or thyromegaly. Lungs:  Clear to auscultation bilaterally. No wheezes, rales, or rhonchi. No distress.  Heart:  S1, S2 present without murmurs appreciated.  Abdomen:  +BS, soft, non-tender and non-distended. No HSM noted. No guarding or rebound. No masses appreciated.  Rectal:  Deferred  Msk:  Symmetrical without gross deformities. Normal posture. Extremities:  Without clubbing or edema. Neurologic:  Alert and  oriented x4;  grossly normal neurologically. Skin:  Intact without significant lesions or rashes. Cervical Nodes:  No significant cervical adenopathy. Psych:  Alert and cooperative. Normal mood and affect.

## 2014-04-10 ENCOUNTER — Encounter (HOSPITAL_COMMUNITY): Payer: Self-pay | Admitting: Internal Medicine

## 2014-04-14 NOTE — Progress Notes (Signed)
cc'd to pcp 

## 2014-04-26 ENCOUNTER — Encounter: Payer: Self-pay | Admitting: Internal Medicine

## 2014-04-28 ENCOUNTER — Encounter: Payer: Self-pay | Admitting: Internal Medicine

## 2014-06-26 ENCOUNTER — Other Ambulatory Visit (HOSPITAL_COMMUNITY): Payer: Self-pay | Admitting: Family Medicine

## 2014-06-26 DIAGNOSIS — Z1231 Encounter for screening mammogram for malignant neoplasm of breast: Secondary | ICD-10-CM

## 2014-07-27 ENCOUNTER — Encounter: Payer: Self-pay | Admitting: *Deleted

## 2014-07-29 ENCOUNTER — Ambulatory Visit: Payer: Medicare Other | Admitting: Gastroenterology

## 2014-08-03 ENCOUNTER — Ambulatory Visit (HOSPITAL_COMMUNITY)
Admission: RE | Admit: 2014-08-03 | Discharge: 2014-08-03 | Disposition: A | Discharge status: Home / Self Care | Payer: Medicare Other | Source: Ambulatory Visit | Attending: Family Medicine | Admitting: Family Medicine

## 2014-08-03 DIAGNOSIS — R928 Other abnormal and inconclusive findings on diagnostic imaging of breast: Secondary | ICD-10-CM | POA: Insufficient documentation

## 2014-08-03 DIAGNOSIS — Z1231 Encounter for screening mammogram for malignant neoplasm of breast: Secondary | ICD-10-CM | POA: Diagnosis present

## 2014-08-04 ENCOUNTER — Other Ambulatory Visit: Payer: Self-pay | Admitting: Family Medicine

## 2014-08-04 DIAGNOSIS — R928 Other abnormal and inconclusive findings on diagnostic imaging of breast: Secondary | ICD-10-CM

## 2014-08-18 ENCOUNTER — Ambulatory Visit (HOSPITAL_COMMUNITY)
Admission: RE | Admit: 2014-08-18 | Discharge: 2014-08-18 | Disposition: A | Discharge status: Home / Self Care | Payer: Medicare Other | Source: Ambulatory Visit | Attending: Family Medicine | Admitting: Family Medicine

## 2014-08-18 ENCOUNTER — Other Ambulatory Visit: Payer: Self-pay | Admitting: Family Medicine

## 2014-08-18 DIAGNOSIS — R928 Other abnormal and inconclusive findings on diagnostic imaging of breast: Secondary | ICD-10-CM | POA: Diagnosis present

## 2014-08-18 DIAGNOSIS — N63 Unspecified lump in breast: Secondary | ICD-10-CM | POA: Insufficient documentation

## 2015-01-15 ENCOUNTER — Other Ambulatory Visit (HOSPITAL_COMMUNITY): Payer: Self-pay | Admitting: Family Medicine

## 2015-01-15 DIAGNOSIS — IMO0002 Reserved for concepts with insufficient information to code with codable children: Secondary | ICD-10-CM

## 2015-01-15 DIAGNOSIS — R229 Localized swelling, mass and lump, unspecified: Principal | ICD-10-CM

## 2015-02-23 ENCOUNTER — Other Ambulatory Visit (HOSPITAL_COMMUNITY): Payer: Self-pay | Admitting: Family Medicine

## 2015-02-23 ENCOUNTER — Ambulatory Visit (HOSPITAL_COMMUNITY)
Admission: RE | Admit: 2015-02-23 | Discharge: 2015-02-23 | Disposition: A | Discharge status: Home / Self Care | Payer: Medicare HMO | Source: Ambulatory Visit | Attending: Family Medicine | Admitting: Family Medicine

## 2015-02-23 DIAGNOSIS — R229 Localized swelling, mass and lump, unspecified: Secondary | ICD-10-CM

## 2015-02-23 DIAGNOSIS — R922 Inconclusive mammogram: Secondary | ICD-10-CM

## 2015-02-23 DIAGNOSIS — N63 Unspecified lump in breast: Secondary | ICD-10-CM | POA: Insufficient documentation

## 2015-02-23 DIAGNOSIS — IMO0002 Reserved for concepts with insufficient information to code with codable children: Secondary | ICD-10-CM

## 2015-05-10 ENCOUNTER — Ambulatory Visit (INDEPENDENT_AMBULATORY_CARE_PROVIDER_SITE_OTHER): Payer: Medicare HMO | Admitting: Orthopedic Surgery

## 2015-05-10 VITALS — BP 165/90 | Ht 65.0 in | Wt 261.0 lb

## 2015-05-10 DIAGNOSIS — M169 Osteoarthritis of hip, unspecified: Secondary | ICD-10-CM | POA: Diagnosis not present

## 2015-05-10 DIAGNOSIS — M24159 Other articular cartilage disorders, unspecified hip: Secondary | ICD-10-CM

## 2015-05-10 MED ORDER — IBUPROFEN 800 MG PO TABS: 800.0000 mg | ORAL_TABLET | Freq: Three times a day (TID) | ORAL | Status: DC

## 2015-05-10 NOTE — Patient Instructions (Addendum)
We will schedule MRI w/ arthrogram for you and call you with appt and results

## 2015-05-18 NOTE — Progress Notes (Signed)
Patient ID: [name], female   DOB: 1951/09/14, 64 y.o.   MRN: [id number]  Chief Complaint  Patient presents with  . Follow-up    follow up left hip pain, last treated 02/27/2013    HPI [name] is a 64 y.o. female.  Presented for evaluation of her left hip. She's had a history of lumbar degenerative disc disease followed by Dr. Joya Salm who last saw her January 2016 and thought that her hip to be reevaluated. We took x-rays of her 2 years ago and they were normal with no signs of arthritis in the left hip  She complains of pain in and around the hip groin iliac crest lower back and left buttock. She has intermittent radicular symptoms as well.  Review of systems she is able to control bowel and bladder she notes pain when sitting in the car and climbing steps and pain is located over the left groin and anterior thigh  We do note on MRI scan of July 2015 she did have L2 and 3 far lateral disc bulges right and left more prominent on the left but no neural impingement no change since the prior study which was done in 2011  Other discharge changes as noted in the report include L3-4, L4-5, L5-S1.     Past Medical History  Diagnosis Date  . Diabetes mellitus   . Hypertension   . Cancer     ovarian    Past Surgical History  Procedure Laterality Date  . Replacement total knee      bilateral  . Cholecystectomy    . Abdominal hysterectomy    . Colonoscopy  2007    Dr. Gala Romney: internal hemorrhoids, few scattered sigmoid diverticula  . Esophagogastroduodenoscopy  2008    Dr. Gala Romney: erosive esophagitis, patulous EG junction  . Colonoscopy N/A 04/09/2014    [id number] diverticulosis. Redundant colon. Single colonic polyp-removed as described above.  . Esophagogastroduodenoscopy N/A 04/09/2014    [id number] reflux esophagitis. Schatzki's ring-status post dilation as described above. Hiatal hernia. Gastricerosions-status post gastric biopsy  . Savory dilation N/A 04/09/2014    Procedure: SAVORY DILATION;  Surgeon: Daneil Dolin, MD;  Location: [facility];  Service: Endoscopy;  Laterality: N/A;  Venia Minks dilation N/A 04/09/2014    Procedure: Venia Minks DILATION;  Surgeon: Daneil Dolin, MD;  Location: [facility];  Service: Endoscopy;  Laterality: N/A;     Allergies  Allergen Reactions  . Gabapentin     REACTION: suicidal thought    Current Outpatient Prescriptions  Medication Sig Dispense Refill  . albuterol (PROVENTIL HFA;VENTOLIN HFA) 108 (90 BASE) MCG/ACT inhaler Inhale 2 puffs into the lungs every 4 (four) hours as needed for wheezing or shortness of breath. 1 Inhaler 1  . aspirin EC 81 MG tablet Take 81 mg by mouth daily.    Marland Kitchen atenolol (TENORMIN) 50 MG tablet Take 50 mg by mouth daily.    . insulin NPH Human (HUMULIN N,NOVOLIN N) 100 UNIT/ML injection Inject into the skin.    Marland Kitchen insulin regular (NOVOLIN R,HUMULIN R) 100 units/mL injection Inject into the skin 3 (three) times daily before meals.    Marland Kitchen lisinopril (PRINIVIL,ZESTRIL) 10 MG tablet Take 10 mg by mouth daily.    Marland Kitchen triamterene-hydrochlorothiazide (MAXZIDE-25) 37.5-25 MG per tablet Take 1 tablet by mouth daily.    . verapamil (COVERA HS) 240 MG (CO) 24 hr tablet Take 240 mg by mouth every morning.    . ezetimibe (ZETIA) 10 MG tablet Take 10  mg by mouth daily.    Marland Kitchen ibuprofen (ADVIL,MOTRIN) 800 MG tablet Take 1 tablet (800 mg total) by mouth 3 (three) times daily. 90 tablet 0   No current facility-administered medications for this visit.    Review of Systems Review of Systems  See above  Physical Exam Blood pressure 165/90, height 5\' 5"  (1.651 m), weight 261 lb (118.389 kg).  Physical Exam [name] despite normal grooming and hygiene has morbid obesity. Body mass index is 43.43 kg/(m^2). She is ambulatory with a waddling gait. Mood is normal. She is oriented 3. Her hip motion reveals some pain with flexion internal rotation test flexion is approximately 115. Her leg morphology  block some of flexion. She has tenderness in the lower back and left gluteal area negative straight leg raise. Motor exam remains normal neurovascular exam is intact skin over the hip and lower back remains normal   Data Reviewed Mri report see above    Assessment    Encounter Diagnosis  Name Primary?  . Labral tear of hip, degenerative Yes        Plan    Mri hip w contrast         Arther Abbott 05/18/2015, 1:59 PM

## 2015-06-17 ENCOUNTER — Other Ambulatory Visit: Payer: Self-pay | Admitting: Orthopedic Surgery

## 2015-06-17 ENCOUNTER — Encounter (HOSPITAL_COMMUNITY): Payer: Self-pay

## 2015-06-17 ENCOUNTER — Ambulatory Visit (HOSPITAL_COMMUNITY)
Admission: RE | Admit: 2015-06-17 | Discharge: 2015-06-17 | Disposition: A | Discharge status: Home / Self Care | Payer: Medicare HMO | Source: Ambulatory Visit | Attending: Orthopedic Surgery | Admitting: Orthopedic Surgery

## 2015-06-17 DIAGNOSIS — M169 Osteoarthritis of hip, unspecified: Principal | ICD-10-CM

## 2015-06-17 DIAGNOSIS — X58XXXA Exposure to other specified factors, initial encounter: Secondary | ICD-10-CM | POA: Diagnosis not present

## 2015-06-17 DIAGNOSIS — S73192A Other sprain of left hip, initial encounter: Secondary | ICD-10-CM | POA: Diagnosis not present

## 2015-06-17 DIAGNOSIS — M24152 Other articular cartilage disorders, left hip: Secondary | ICD-10-CM | POA: Insufficient documentation

## 2015-06-17 DIAGNOSIS — M24151 Other articular cartilage disorders, right hip: Secondary | ICD-10-CM | POA: Insufficient documentation

## 2015-06-17 DIAGNOSIS — M24159 Other articular cartilage disorders, unspecified hip: Secondary | ICD-10-CM

## 2015-06-17 DIAGNOSIS — M7061 Trochanteric bursitis, right hip: Secondary | ICD-10-CM | POA: Insufficient documentation

## 2015-06-17 DIAGNOSIS — M25552 Pain in left hip: Secondary | ICD-10-CM | POA: Diagnosis present

## 2015-06-17 MED ORDER — GADOBENATE DIMEGLUMINE 529 MG/ML IV SOLN
5.0000 mL | Freq: Once | INTRAVENOUS | Status: DC | PRN
  Administered 2015-06-17: 0.05 mL via INTRA_ARTICULAR
  Filled 2015-06-17: qty 5

## 2015-06-17 MED ORDER — LIDOCAINE-EPINEPHRINE (PF) 1 %-1:200000 IJ SOLN
INTRAMUSCULAR | Status: AC
  Filled 2015-06-17: qty 10

## 2015-06-17 MED ORDER — IOHEXOL 300 MG/ML  SOLN
50.0000 mL | Freq: Once | INTRAMUSCULAR | Status: DC | PRN
  Administered 2015-06-17: 20 mL via INTRAVENOUS
  Filled 2015-06-17: qty 50

## 2015-06-17 MED ORDER — LIDOCAINE HCL (PF) 2 % IJ SOLN
INTRAMUSCULAR | Status: AC
  Filled 2015-06-17: qty 10

## 2015-06-17 NOTE — Procedures (Signed)
Preprocedure Dx: Labral tear LEFT hip Postprocedure Dx: Labral tear LEFT hip Procedure  Fluoroscopically guided LEFT hip joint injection for MR arthrogrpahy Radiologist:  Thornton Papas Anesthesia:  4 ml of 1% lidocaine Injectate:  8 ml or a solution of [0.05 ml Multihance in 20 ml Omnipaque-300) Fluoro time:  2 minutes 12 seconds EBL:   None Complications: None

## 2015-06-21 ENCOUNTER — Other Ambulatory Visit (HOSPITAL_COMMUNITY): Payer: Self-pay | Admitting: Family Medicine

## 2015-06-21 DIAGNOSIS — Z09 Encounter for follow-up examination after completed treatment for conditions other than malignant neoplasm: Secondary | ICD-10-CM

## 2015-07-26 ENCOUNTER — Telehealth: Payer: Self-pay | Admitting: Orthopedic Surgery

## 2015-07-26 NOTE — Telephone Encounter (Signed)
Call received from patient regarding MRI results, of 06/17/15, at [facility].  States she has only her Cell PH# [phone]; states she is also on MyChart.

## 2015-07-27 NOTE — Telephone Encounter (Signed)
Routing to nurse

## 2015-07-27 NOTE — Telephone Encounter (Signed)
MRI HIP : RESULTS SHOW   ARTHRITIS  TORN CARTILAGE   DOESN'T NEED SURGERY   CAN INJECT AGAIN IF NEEDED

## 2015-07-29 NOTE — Telephone Encounter (Signed)
Per nurse, okay to relay Dr Ruthe Mannan response, which I have done, as of today, 07/29/15, approximately 11:30am; Patient aware of results, and aware to call back to nurse or Dr Aline Brochure if any questions.

## 2015-08-16 ENCOUNTER — Ambulatory Visit (HOSPITAL_COMMUNITY)
Admission: RE | Admit: 2015-08-16 | Discharge: 2015-08-16 | Disposition: A | Discharge status: Home / Self Care | Payer: Medicare HMO | Source: Ambulatory Visit | Attending: Family Medicine | Admitting: Family Medicine

## 2015-08-16 DIAGNOSIS — Z09 Encounter for follow-up examination after completed treatment for conditions other than malignant neoplasm: Secondary | ICD-10-CM

## 2015-08-16 DIAGNOSIS — Z1231 Encounter for screening mammogram for malignant neoplasm of breast: Secondary | ICD-10-CM | POA: Insufficient documentation

## 2015-08-19 ENCOUNTER — Ambulatory Visit (INDEPENDENT_AMBULATORY_CARE_PROVIDER_SITE_OTHER): Payer: Medicare HMO | Admitting: Orthopedic Surgery

## 2015-08-19 VITALS — BP 145/79 | Ht 65.0 in | Wt 257.0 lb

## 2015-08-19 DIAGNOSIS — M25552 Pain in left hip: Secondary | ICD-10-CM | POA: Diagnosis not present

## 2015-08-19 DIAGNOSIS — M1612 Unilateral primary osteoarthritis, left hip: Secondary | ICD-10-CM

## 2015-08-19 DIAGNOSIS — M24159 Other articular cartilage disorders, unspecified hip: Secondary | ICD-10-CM

## 2015-08-19 DIAGNOSIS — M169 Osteoarthritis of hip, unspecified: Secondary | ICD-10-CM

## 2015-08-19 NOTE — Progress Notes (Signed)
Follow-up visit established patient  Evaluation for left hip pain. She's had evaluation by Dr. Joya Salm he sent her back to me for evaluation of her hip. 2014 x-ray showed no hip arthritis. We center for MRI it did show that she had cartilage loss labral tearing and she presents now for reevaluation with possibility of intra-articular injection  Review of systems negative regarding her radicular symptoms  Leg lengths are equal hip range of motion equals 120 she has painful internal rotation of 15 motor exam in both lower extremities normal without atrophy hip is stable to post pull test and neurovascular exam is intact  Recommend intra-articular hip injection in follow-up in January for AP lateral left hip

## 2015-08-19 NOTE — Patient Instructions (Signed)
We will send you for a Left hip joint injection at radiology, we will call you with appt

## 2015-08-25 ENCOUNTER — Encounter (HOSPITAL_COMMUNITY): Payer: Self-pay

## 2015-08-25 ENCOUNTER — Ambulatory Visit (HOSPITAL_COMMUNITY)
Admission: RE | Admit: 2015-08-25 | Discharge: 2015-08-25 | Disposition: A | Discharge status: Home / Self Care | Payer: Medicare HMO | Source: Ambulatory Visit | Attending: Orthopedic Surgery | Admitting: Orthopedic Surgery

## 2015-08-25 DIAGNOSIS — M25552 Pain in left hip: Secondary | ICD-10-CM | POA: Diagnosis present

## 2015-08-25 MED ORDER — POVIDONE-IODINE 10 % EX SOLN
CUTANEOUS | Status: AC
  Filled 2015-08-25: qty 15

## 2015-08-25 MED ORDER — METHYLPREDNISOLONE ACETATE 40 MG/ML IJ SUSP
INTRAMUSCULAR | Status: AC
  Filled 2015-08-25: qty 1

## 2015-08-25 MED ORDER — IOHEXOL 300 MG/ML  SOLN
50.0000 mL | Freq: Once | INTRAMUSCULAR | Status: AC | PRN
  Administered 2015-08-25: 5 mL via INTRAVENOUS

## 2015-08-25 MED ORDER — LIDOCAINE HCL (PF) 1 % IJ SOLN
INTRAMUSCULAR | Status: AC
  Filled 2015-08-25: qty 5

## 2015-09-08 ENCOUNTER — Telehealth: Payer: Self-pay | Admitting: *Deleted

## 2015-09-08 NOTE — Telephone Encounter (Signed)
Patient called stating she got a shot in right her hip 08/19/15 and her right hip is still hurting and patient stated she wanted Dr. Aline Brochure to see her today and I made her aware that Dr. Aline Brochure is in surgery all day today, I made patient aware that is her right hip is having pain to go see her primary care doctor, ER, or a urgent care. Patient stated she is going to an Urgent care to see if she can get a shot to help her walk and to help with the pain, I made her aware that I will still tell Dr. Aline Brochure about her hip pain. Please advise

## 2015-09-08 NOTE — Telephone Encounter (Signed)
Noted and patient has follow up visit 09/29/14

## 2015-09-28 ENCOUNTER — Ambulatory Visit (INDEPENDENT_AMBULATORY_CARE_PROVIDER_SITE_OTHER): Payer: Medicare Other

## 2015-09-28 ENCOUNTER — Ambulatory Visit (INDEPENDENT_AMBULATORY_CARE_PROVIDER_SITE_OTHER): Payer: Medicare Other | Admitting: Orthopedic Surgery

## 2015-09-28 VITALS — Ht 65.0 in | Wt 250.0 lb

## 2015-09-28 DIAGNOSIS — M5410 Radiculopathy, site unspecified: Secondary | ICD-10-CM

## 2015-09-28 DIAGNOSIS — M25552 Pain in left hip: Secondary | ICD-10-CM | POA: Diagnosis not present

## 2015-09-28 MED ORDER — HYDROCODONE-ACETAMINOPHEN 5-325 MG PO TABS: 1.0000 | ORAL_TABLET | Freq: Four times a day (QID) | ORAL | Status: DC | PRN

## 2015-09-28 NOTE — Patient Instructions (Signed)
We will schedule MRI for you and call you with appt.

## 2015-09-28 NOTE — Progress Notes (Signed)
Recheck ongoing problems left hip and lumbar spine. She had an MRI of her left hip and a digital cartilage loss with labral tearing she got an injection and did well for 3 days and then started having what she continues to call hip pain but which is actually in her lower back  Review of systems the pain radiates down the lumbar buttock and posterior aspect of her left thigh.  She denies groin pain or anterior thigh pain  She is ambulatory with a cane still complaining of pain did get relief from hydrocodone  Ht 5\' 5"  (1.651 m)  Wt 250 lb (113.399 kg)  BMI 41.60 kg/m2 Oriented 3 mood pleasant she's a mature with using her cane she has a limp favoring the left leg she has tenderness in the left lumbar spine lower buttocks. Nontender in the front of the thigh hip motion is normal and asymptomatic stable motor exam is normal. No vascular deficiencies in the left leg  X-rays today show moderate arthritis without secondary bone changes but a fair amount of joint space narrowing in the hip  Impression osteoarthritis left hip currently asymptomatic  Persistent radicular symptoms left leg  Recommend MRI repeat epidural steroid.

## 2015-09-30 ENCOUNTER — Ambulatory Visit: Payer: Medicare HMO | Admitting: Orthopedic Surgery

## 2015-10-06 ENCOUNTER — Ambulatory Visit
Admission: RE | Admit: 2015-10-06 | Discharge: 2015-10-06 | Disposition: A | Discharge status: Home / Self Care | Payer: Medicare Other | Source: Ambulatory Visit | Attending: Orthopedic Surgery | Admitting: Orthopedic Surgery

## 2015-10-06 DIAGNOSIS — M5410 Radiculopathy, site unspecified: Secondary | ICD-10-CM

## 2015-10-06 DIAGNOSIS — Z23 Encounter for immunization: Secondary | ICD-10-CM | POA: Diagnosis not present

## 2015-10-06 DIAGNOSIS — M5126 Other intervertebral disc displacement, lumbar region: Secondary | ICD-10-CM | POA: Diagnosis not present

## 2015-10-11 ENCOUNTER — Telehealth: Payer: Self-pay | Admitting: Orthopedic Surgery

## 2015-10-11 ENCOUNTER — Other Ambulatory Visit: Payer: Self-pay | Admitting: *Deleted

## 2015-10-11 DIAGNOSIS — M48061 Spinal stenosis, lumbar region without neurogenic claudication: Secondary | ICD-10-CM

## 2015-10-11 NOTE — Telephone Encounter (Signed)
NEW L3 DISC PROTRUSION  SPOKE TO PATIENT   SET UP ESI

## 2015-10-15 ENCOUNTER — Ambulatory Visit
Admission: RE | Admit: 2015-10-15 | Discharge: 2015-10-15 | Disposition: A | Discharge status: Home / Self Care | Payer: Medicare Other | Source: Ambulatory Visit | Attending: Orthopedic Surgery | Admitting: Orthopedic Surgery

## 2015-10-15 ENCOUNTER — Other Ambulatory Visit: Payer: Self-pay | Admitting: Orthopedic Surgery

## 2015-10-15 DIAGNOSIS — M48061 Spinal stenosis, lumbar region without neurogenic claudication: Secondary | ICD-10-CM

## 2015-10-15 DIAGNOSIS — M5416 Radiculopathy, lumbar region: Secondary | ICD-10-CM | POA: Diagnosis not present

## 2015-10-15 MED ORDER — METHYLPREDNISOLONE ACETATE 40 MG/ML INJ SUSP (RADIOLOG
120.0000 mg | Freq: Once | INTRAMUSCULAR | Status: AC
  Administered 2015-10-15: 120 mg via EPIDURAL

## 2015-10-15 MED ORDER — IOHEXOL 180 MG/ML  SOLN
1.0000 mL | Freq: Once | INTRAMUSCULAR | Status: AC | PRN
  Administered 2015-10-15: 1 mL via EPIDURAL

## 2015-10-15 NOTE — Discharge Instructions (Signed)

## 2015-11-01 ENCOUNTER — Ambulatory Visit (INDEPENDENT_AMBULATORY_CARE_PROVIDER_SITE_OTHER): Payer: Medicare Other | Admitting: Orthopedic Surgery

## 2015-11-01 VITALS — BP 155/75 | HR 61 | Ht 65.0 in | Wt 250.0 lb

## 2015-11-01 DIAGNOSIS — M5410 Radiculopathy, site unspecified: Secondary | ICD-10-CM | POA: Diagnosis not present

## 2015-11-01 DIAGNOSIS — M25552 Pain in left hip: Secondary | ICD-10-CM

## 2015-11-01 DIAGNOSIS — M169 Osteoarthritis of hip, unspecified: Secondary | ICD-10-CM | POA: Diagnosis not present

## 2015-11-01 DIAGNOSIS — M24159 Other articular cartilage disorders, unspecified hip: Secondary | ICD-10-CM

## 2015-11-01 DIAGNOSIS — M1612 Unilateral primary osteoarthritis, left hip: Secondary | ICD-10-CM | POA: Diagnosis not present

## 2015-11-01 NOTE — Progress Notes (Signed)
Recheck after L3 nerve root injection for radicular symptoms left leg  Patient says it didn't help although that pain is now somewhat relieved.  She's now complaining of groin pain anterior thigh pain and she did get good relief from an intra-articular injection of the hips over, try that again  Review of systems no frank weakness in the left leg no radicular symptoms at present.  Past Medical History  Diagnosis Date  . Diabetes mellitus   . Hypertension   . Cancer (HCC)     ovarian    BP 155/75 mmHg  Pulse 61  Ht 5\' 5"  (1.651 m)  Wt 250 lb (113.399 kg)  BMI 41.60 kg/m2 She is awake alert and oriented 3 her mood and affect are normal. General appearance normal well groomed. She is ambulating with a cane. She has decreased hip flexion painful hip flexion normal leg lengths neurovascular exam otherwise intact  Report of nerve root injection as follows  LEFT L3 NERVE ROOT BLOCK AND TRANSFORAMINAL EPIDURAL: A posterior oblique approach was taken to the intervertebral foramen on the left at L3-4 using a curved 5 inch 22 gauge spinal needle. Injection of Isovue-M 200 outlined the left L3 nerve root and showed good epidural spread. No vascular opacification is seen. 120 mg of Depo-Medrol mixed with 3 mL of 1% lidocaine were instilled. The procedure was well-tolerated, and the patient was discharged 20 minutes following the injection in good condition. The patient reported complete pain relief at the time of discharge.  COMPLICATIONS: None  IMPRESSION: Technically successful injection consisting of a left L3 nerve root block and transforaminal epidural.   We recommended tried intake of injection left hip if we get good relief from that then we will recommend total hip replacement.

## 2015-11-01 NOTE — Patient Instructions (Signed)
WE WILL SCHEDULE YOUR INJECTION AT [facility] AND CALL YOU WITH APPT

## 2015-11-05 ENCOUNTER — Ambulatory Visit (HOSPITAL_COMMUNITY)
Admission: RE | Admit: 2015-11-05 | Discharge: 2015-11-05 | Disposition: A | Discharge status: Home / Self Care | Payer: Medicare Other | Source: Ambulatory Visit | Attending: Orthopedic Surgery | Admitting: Orthopedic Surgery

## 2015-11-05 DIAGNOSIS — M25552 Pain in left hip: Secondary | ICD-10-CM

## 2015-11-05 MED ORDER — LIDOCAINE HCL (PF) 1 % IJ SOLN
INTRAMUSCULAR | Status: AC
  Administered 2015-11-05: 5 mL via SUBCUTANEOUS
  Filled 2015-11-05: qty 5

## 2015-11-05 MED ORDER — LIDOCAINE HCL (PF) 1 % IJ SOLN
INTRAMUSCULAR | Status: AC
  Administered 2015-11-05: 5 mL via INTRA_ARTICULAR
  Filled 2015-11-05: qty 10

## 2015-11-05 MED ORDER — METHYLPREDNISOLONE ACETATE 40 MG/ML IJ SUSP
INTRAMUSCULAR | Status: AC
  Filled 2015-11-05: qty 1

## 2015-11-05 MED ORDER — METHYLPREDNISOLONE ACETATE 40 MG/ML IJ SUSP
INTRAMUSCULAR | Status: AC
  Administered 2015-11-05: 40 mg via INTRA_ARTICULAR
  Filled 2015-11-05: qty 1

## 2015-11-05 MED ORDER — POVIDONE-IODINE 10 % EX SOLN
CUTANEOUS | Status: AC
  Administered 2015-11-05: 15:00:00
  Filled 2015-11-05: qty 15

## 2015-11-05 NOTE — Procedures (Signed)
Preprocedure Dx: LT hip pain Postprocedure Dx: LT hip pain Procedure  Fluoroscopically guided therapeutic LT joint injection Radiologist:  Thornton Papas Anesthesia:  3 ml of 1% Injectate:  2 ml Omnipaque-350 to confirm placement; Depo-Medrol 40 mg and 5 ml of Lidocaine 1% Fluoro time:  1 minutes 30 seconds EBL:   None Complications: None

## 2015-11-16 DIAGNOSIS — E118 Type 2 diabetes mellitus with unspecified complications: Secondary | ICD-10-CM | POA: Diagnosis not present

## 2015-11-16 DIAGNOSIS — I10 Essential (primary) hypertension: Secondary | ICD-10-CM | POA: Diagnosis not present

## 2015-11-16 DIAGNOSIS — E1165 Type 2 diabetes mellitus with hyperglycemia: Secondary | ICD-10-CM | POA: Diagnosis not present

## 2015-11-16 DIAGNOSIS — J449 Chronic obstructive pulmonary disease, unspecified: Secondary | ICD-10-CM | POA: Diagnosis not present

## 2015-11-25 NOTE — Progress Notes (Signed)
Physical Exam  Ortho Exam

## 2015-11-25 NOTE — Progress Notes (Signed)
Physical Exam  Constitutional: She is oriented to person, place, and time and well-developed, well-nourished, and in no distress. She appears well-developed and well-nourished. No distress.  Cardiovascular: Normal rate and intact distal pulses.   Neurological: She is alert and oriented to person, place, and time. She has normal reflexes. She exhibits normal muscle tone. Coordination normal.  Skin: Skin is warm and dry. No rash noted. She is not diaphoretic. No erythema. No pallor.  Psychiatric: She has a normal mood and affect. Her behavior is normal. Judgment and thought content normal.    Physical Exam  Constitutional: She is oriented to person, place, and time and well-developed, well-nourished, and in no distress. She appears well-developed and well-nourished. No distress.  Cardiovascular: Normal rate and intact distal pulses.   Neurological: She is alert and oriented to person, place, and time. She has normal reflexes. She exhibits normal muscle tone. Coordination normal.  Skin: Skin is warm and dry. No rash noted. She is not diaphoretic. No erythema. No pallor.  Psychiatric: She has a normal mood and affect. Her behavior is normal. Judgment and thought content normal.

## 2015-11-26 ENCOUNTER — Encounter: Payer: Self-pay | Admitting: Orthopedic Surgery

## 2015-11-26 ENCOUNTER — Ambulatory Visit (INDEPENDENT_AMBULATORY_CARE_PROVIDER_SITE_OTHER): Payer: Medicare Other | Admitting: Orthopedic Surgery

## 2015-11-26 VITALS — BP 150/87 | Ht 65.0 in | Wt 259.0 lb

## 2015-11-26 DIAGNOSIS — M1612 Unilateral primary osteoarthritis, left hip: Secondary | ICD-10-CM | POA: Diagnosis not present

## 2015-11-26 DIAGNOSIS — M5410 Radiculopathy, site unspecified: Secondary | ICD-10-CM | POA: Diagnosis not present

## 2015-11-26 NOTE — Patient Instructions (Signed)
Surgery-Left total hip replacement, 12/08/15   You have decided to proceed with hip replacement surgery. You have decided not to continue with nonoperative measures such as but not limited to oral medication, weight loss, activity modification, physical therapy, bracing, or injection.  We will perform the procedure commonly known as total hip replacement. Some of the risks associated with total hip replacement include but are not limited to Bleeding Infection Swelling Stiffness Blood clot Pain Dislocation LEG LENGTH INEQUALITY REQUIRING A SHOE LIFT  Infection is a devastating complication of joint replacement surgery. If you get an infection the implant usually will have to be removed and several surgeries and antibiotics will be needed to eradicate the infection. In some rare cases the hip cannot be reconstructed with another implant. This will lead to permanent need for crutches and assistive devices to ambulate.   If you're not comfortable with these risks and would like to continue with nonoperative treatment please let Dr. Aline Brochure know prior to your surgery.

## 2015-11-26 NOTE — Progress Notes (Signed)
Chief Complaint  Patient presents with  . Follow-up    follow up from fluoro joint injection left hip   The patient has gone for epidural shot in the back and intra-articular joint left hip injection with more improvement of pain from the hip injection although it lasted only one day  Her symptoms include difficulty walking, groin pain, anterior thigh pain, painful flexion of the hip and weakness in flexion of the hip.  Past Medical History  Diagnosis Date  . Diabetes mellitus   . Hypertension   . Cancer ([place])     ovarian   Past Surgical History  Procedure Laterality Date  . Replacement total knee      bilateral  . Cholecystectomy    . Abdominal hysterectomy    . Colonoscopy  2007    Dr. Gala Romney: internal hemorrhoids, few scattered sigmoid diverticula  . Esophagogastroduodenoscopy  2008    Dr. Gala Romney: erosive esophagitis, patulous EG junction  . Colonoscopy N/A 04/09/2014    [id number] diverticulosis. Redundant colon. Single colonic polyp-removed as described above.  . Esophagogastroduodenoscopy N/A 04/09/2014    [id number] reflux esophagitis. Schatzki's ring-status post dilation as described above. Hiatal hernia. Gastricerosions-status post gastric biopsy  . Savory dilation N/A 04/09/2014    Procedure: SAVORY DILATION;  Surgeon: Daneil Dolin, MD;  Location: [facility];  Service: Endoscopy;  Laterality: N/A;  Venia Minks dilation N/A 04/09/2014    Procedure: Venia Minks DILATION;  Surgeon: Daneil Dolin, MD;  Location: [facility];  Service: Endoscopy;  Laterality: N/A;     BP 150/87 mmHg  Ht 5\' 5"  (1.651 m)  Wt 259 lb (117.482 kg)  BMI 43.10 kg/m2  Physical Exam  Constitutional: She is oriented to person, place, and time. She appears well-developed and well-nourished. No distress.  Cardiovascular: Intact distal pulses.   Neurological: She is alert and oriented to person, place, and time. She exhibits normal muscle tone. Coordination normal.  Skin: Skin is warm and dry. No  rash noted. She is not diaphoretic. No erythema. No pallor.  Psychiatric: She has a normal mood and affect. Her behavior is normal. Judgment and thought content normal.  She has a significant limp and ambulates with a cane Left Hip Exam   Tenderness  The patient is experiencing tenderness in the anterior.  Range of Motion  Extension: 10  Flexion:  100 abnormal  Internal Rotation: 0  External Rotation: 10  Abduction: 15  Adduction: 5   Muscle Strength  Abduction: 5/5  Adduction: 5/5  Flexion: 4/5   Other  Erythema: absent Scars: absent Sensation: normal Pulse: present     At this point I don't think we are going to determine which is the primary cause of the groin and leg pain affecting both back and hip are contributing  She is a high-risk patient with a BMI over 40, diabetes, use of albuterol secondary to smoking although she stopped, she has some hypertension as well.  But her best chance of improvement is with a left total hip replacement   Diagnosis osteoarthritis left hip  Diagnosis spinal stenosis lumbar spine  Plan left total hip arthroplasty informed consent has been done in the office Risks and benefits and complications explained including alternative treatments. The patient has opted for surgical treatment

## 2015-11-29 ENCOUNTER — Telehealth: Payer: Self-pay | Admitting: Orthopedic Surgery

## 2015-11-29 NOTE — Telephone Encounter (Signed)
[place] [employer] regarding scheduled in-patient surgery, CPT 27130, total hip arthroplasty, ICD-10 codes M16.12, M54.10; per Pearline Cables, this CPT code is on the "prior authorization reduction list"; therefore, will not require pre-authorization until the day of admission at [facility] 12/08/15.  States no reference #, her name, Pearline Cables, 6:49p.m.

## 2015-11-30 ENCOUNTER — Telehealth: Payer: Self-pay | Admitting: Orthopedic Surgery

## 2015-11-30 NOTE — Telephone Encounter (Signed)
Patient called and wanted to speak to the nurse.  She would not elaborate for me at all.  Please call her.

## 2015-12-01 NOTE — Patient Instructions (Signed)
[name]  12/01/2015     @[email or web address] @   Your procedure is scheduled on  12/08/2015   Report to [facility] at  700  A.M.  Call this number if you have problems the morning of surgery:  [phone]   Remember:  Do not eat food or drink liquids after midnight.  Take these medicines the morning of surgery with A SIP OF WATER  Atenolol, hydrocodone,lisinopril, mobic, ultram, verapamil. Take your inhaler before you come. Only take 1/2 of your insulin the night before your surgery. DO NOT take any medicines for your diabetes the morning of your surgery.   Do not wear jewelry, make-up or nail polish.  Do not wear lotions, powders, or perfumes.  You may wear deodorant.  Do not shave 48 hours prior to surgery.  Men may shave face and neck.  Do not bring valuables to the hospital.  [facility] is not responsible for any belongings or valuables.  Contacts, dentures or bridgework may not be worn into surgery.  Leave your suitcase in the car.  After surgery it may be brought to your room.  For patients admitted to the hospital, discharge time will be determined by your treatment team.  Patients discharged the day of surgery will not be allowed to drive home.   Name and phone number of your driver:   family Special instructions:  none  Please read over the following fact sheets that you were given. Coughing and Deep Breathing, Blood Transfusion Information, Lab Information, Total Joint Packet, MRSA Information, Surgical Site Infection Prevention, Anesthesia Post-op Instructions and Care and Recovery After Surgery      Total Hip Replacement Total hip replacement is a surgical procedure to remove damaged bone in your hip joint and replace it with an artificial hip joint (prosthetic hip joint). The purpose of this surgery is to reduce pain and to improve your hip function.  During a total hip replacement, one or both parts of the hip joint are replaced, depending on  the type of joint damage you have. The hip is a ball-and-socket type of joint, and it has two main parts. The ball part of the joint (femoral head) is the top of the thigh bone (femur). The socket part of the joint is a large indent in the side of your pelvis (acetabulum) where the femur and pelvis meet. LET [facility] CARE PROVIDER KNOW ABOUT:  Any allergies you have.  All medicines you are taking, including vitamins, herbs, eye drops, creams, and over-the-counter medicines.  Previous problems you or members of your family have had with the use of anesthetics.  Any blood disorders you have.  Previous surgeries you have had.  Medical conditions you have. RISKS AND COMPLICATIONS  Generally, total hip replacement is a safe procedure. However, problems can occur, including:  Infection.  Dislocation (the ball of the hip-joint prosthesis comes out of contact with the socket).  Loosening of the piece (stem) that connects the prosthetic femoral head to the femur.  Fracture of the bone while inserting the prosthesis.  Formation of blood clots, which can break loose and travel to and injure your lungs (pulmonary embolus). BEFORE THE PROCEDURE   Plan to have someone take you home after the procedure.  Do not eat or drink anything after midnight on the night before the procedure or as directed by your health care provider.  Ask your health care provider about:  Changing or stopping your regular  medicines. This is especially important if you are taking diabetes medicines or blood thinners.  Taking medicines such as aspirin and ibuprofen. These medicines can thin your blood. Do not take these medicines before your procedure if your health care provider asks you not to.  Ask your health care provider about how your surgical site will be marked or identified.  You may be given antibiotic medicines to help prevent infection. PROCEDURE   To reduce your risk of infection:  Your health  care team will wash or sanitize their hands.  Your skin will be washed with soap.  An IV tube will be inserted into one of your veins. You will be given one or more of the following:  A medicine that makes you drowsy (sedative).  A medicine that makes you fall asleep (general anesthetic).  A medicine injected into your spine that numbs your body below the waist (spinal anesthetic).  An incision will be made in your hip. Your surgeon will take out any damaged cartilage and bone.  Your surgeon will then:  Insert a prosthetic socket into the acetabulum of your pelvis. This is usually secured with screws.  Remove the femoral head and replace it with a prosthetic ball and stem secured into the top of your femur.  Place the ball into the socket and check the range of motion and stability of your new hip.  Close the incision and apply a bandage over the surgical site. AFTER THE PROCEDURE   You will stay in a recovery area until the medicines have worn off.  Your vital signs, such as your pulse and blood pressure, will be monitored.  Once you are awake and stable, you will be taken to a hospital room.  You may be directed to take actions to help prevent blood clots. These may include:  Walking soon after surgery, with someone assisting you. Moving around after surgery helps to improve blood flow.  Taking medicines to thin your blood (anticoagulants).  Wearing compression stockings or using different types of devices.  You will receive physical therapy until you are doing well and your health care provider feels it is safe for you to go home.   This information is not intended to replace advice given to you by your health care provider. Make sure you discuss any questions you have with your health care provider.   Document Released: 12/11/2000 Document Revised: 05/26/2015 Document Reviewed: 11/05/2013 Elsevier Interactive Patient Education 2016 [place]. Total Hip  Replacement, Care After These instructions give you information on caring for yourself after your procedure. Your doctor also may give you specific instructions. Call your doctor if you have any problems or questions after your procedure. HOME CARE  Your doctor will give you instructions on what types of movements are okay and not okay.   Take medicines as told by your doctor.  Do not take baths, swim, or use a hot tub until your doctor says that it is okay.  Avoid lifting until your doctor says it is okay.  Use a raised toilet as told by your doctor.  Avoid sitting in low chairs as told by your doctor.  Use crutches or a walker as told by your doctor.  Rest often, but move around as much as you can. Movement helps you to heal and helps to prevent problems.  Wear special socks (compression stockings) as told by your doctor. These socks help to prevent blood clots and lessen swelling of your legs.  Follow instructions from  your doctor about how to take care of your cut from surgery (incision). Make sure you:  Wash your hands with soap and water before you change your bandage (dressing). If you cannot use soap and water, use hand sanitizer.  Change your bandage as told by your doctor.  Leave stitches (sutures), skin glue, or skin tape (adhesive) strips in place. These may need to stay in place for 2 weeks or longer. If the tape strips get loose and curl up, you may trim the loose edges. Do not remove the strips completely unless your doctor says it is okay. GET HELP IF:  You have trouble breathing.  You have fluid coming from your surgery site.  You have redness or swelling at your surgery site.  You have a bad smell coming from your surgery site.  You have bleeding that will not stop.  Your surgical cut opens up after sutures (stitches) or staples are removed.  You have a fever. GET HELP RIGHT AWAY IF:  You have a rash.  You have pain or puffiness on your thigh or on the  back of your lower leg.  You have shortness of breath or chest pain.   This information is not intended to replace advice given to you by your health care provider. Make sure you discuss any questions you have with your health care provider.   Document Released: 11/27/2011 Document Revised: 05/26/2015 Document Reviewed: 11/05/2013 Elsevier Interactive Patient Education 2016 [employer] PATIENT INSTRUCTIONS POST-ANESTHESIA  IMMEDIATELY FOLLOWING SURGERY:  Do not drive or operate machinery for the first twenty four hours after surgery.  Do not make any important decisions for twenty four hours after surgery or while taking narcotic pain medications or sedatives.  If you develop intractable nausea and vomiting or a severe headache please notify your doctor immediately.  FOLLOW-UP:  Please make an appointment with your surgeon as instructed. You do not need to follow up with anesthesia unless specifically instructed to do so.  WOUND CARE INSTRUCTIONS (if applicable):  Keep a dry clean dressing on the anesthesia/puncture wound site if there is drainage.  Once the wound has quit draining you may leave it open to air.  Generally you should leave the bandage intact for twenty four hours unless there is drainage.  If the epidural site drains for more than 36-48 hours please call the anesthesia department.  QUESTIONS?:  Please feel free to call your physician or the hospital operator if you have any questions, and they will be happy to assist you.

## 2015-12-01 NOTE — Telephone Encounter (Signed)
Spoke with patient, and she was asking about going to rehab after her surgery, due to she doesn't have anyone to care for her at home. I advised her to talk with them at her pre op appointment, and the nurse case manager could arrange that for her.

## 2015-12-03 ENCOUNTER — Encounter (HOSPITAL_COMMUNITY): Payer: Self-pay

## 2015-12-03 ENCOUNTER — Other Ambulatory Visit: Payer: Self-pay

## 2015-12-03 ENCOUNTER — Encounter (HOSPITAL_COMMUNITY)
Admission: RE | Admit: 2015-12-03 | Discharge: 2015-12-03 | Disposition: A | Discharge status: Home / Self Care | Payer: Medicare Other | Source: Ambulatory Visit | Attending: Orthopedic Surgery | Admitting: Orthopedic Surgery

## 2015-12-03 DIAGNOSIS — Z01818 Encounter for other preprocedural examination: Secondary | ICD-10-CM | POA: Insufficient documentation

## 2015-12-03 HISTORY — DX: Chronic obstructive pulmonary disease, unspecified: J44.9

## 2015-12-03 LAB — BASIC METABOLIC PANEL
ANION GAP: 10 (ref 5–15)
BUN: 15 mg/dL (ref 6–20)
CALCIUM: 9.2 mg/dL (ref 8.9–10.3)
CHLORIDE: 98 mmol/L — AB (ref 101–111)
CO2: 26 mmol/L (ref 22–32)
Creatinine, Ser: 0.9 mg/dL (ref 0.44–1.00)
GFR calc Af Amer: >60 mL/min (ref >60)
GFR calc non Af Amer: >60 mL/min (ref >60)
GLUCOSE: 160 mg/dL — AB (ref 65–99)
POTASSIUM: 4.1 mmol/L (ref 3.5–5.1)
Sodium: 134 mmol/L — ABNORMAL LOW (ref 135–145)

## 2015-12-03 LAB — CBC WITH DIFFERENTIAL/PLATELET
Basophils Absolute: 0 10*3/uL (ref 0.0–0.1)
Basophils Relative: 0 %
Eosinophils Absolute: 0.1 10*3/uL (ref 0.0–0.7)
Eosinophils Relative: 1 %
HEMATOCRIT: 39.3 % (ref 36.0–46.0)
HEMOGLOBIN: 13.1 g/dL (ref 12.0–15.0)
LYMPHS PCT: 31 %
Lymphs Abs: 2.7 10*3/uL (ref 0.7–4.0)
MCH: 30.5 pg (ref 26.0–34.0)
MCHC: 33.3 g/dL (ref 30.0–36.0)
MCV: 91.6 fL (ref 78.0–100.0)
MONO ABS: 0.6 10*3/uL (ref 0.1–1.0)
MONOS PCT: 7 %
NEUTROS ABS: 5.3 10*3/uL (ref 1.7–7.7)
Neutrophils Relative %: 61 %
Platelets: 347 10*3/uL (ref 150–400)
RBC: 4.29 MIL/uL (ref 3.87–5.11)
RDW: 14 % (ref 11.5–15.5)
WBC: 8.7 10*3/uL (ref 4.0–10.5)

## 2015-12-03 LAB — SURGICAL PCR SCREEN
MRSA, PCR: NEGATIVE
STAPHYLOCOCCUS AUREUS: NEGATIVE

## 2015-12-03 LAB — PROTIME-INR
INR: 0.93 (ref 0.00–1.49)
Prothrombin Time: 12.7 seconds (ref 11.6–15.2)

## 2015-12-03 LAB — APTT: aPTT: 25 seconds (ref 24–37)

## 2015-12-03 NOTE — Pre-Procedure Instructions (Signed)
Patient given information to sign up for my chart at home. Given information on total hip replacement. She hashad 2 total knee replacements done and  did not want to sign up for EMMI.

## 2015-12-04 LAB — PREPARE RBC (CROSSMATCH)

## 2015-12-07 MED ORDER — TRANEXAMIC ACID 1000 MG/10ML IV SOLN
1000.0000 mg | INTRAVENOUS | Status: AC
  Administered 2015-12-08: 1000 mg via INTRAVENOUS
  Filled 2015-12-07: qty 10

## 2015-12-07 NOTE — H&P (Signed)
TOTAL HIP ADMISSION H&P  Patient is admitted for left total hip arthroplasty.  Subjective:  Chief Complaint: left hip pain  HPI: [name], 65 y.o. female, has a history of pain and functional disability in the left hip(s) due to arthritis and patient has failed non-surgical conservative treatments for greater than 12 weeks to include NSAID's and/or analgesics, corticosteriod injections, flexibility and strengthening excercises, use of assistive devices and activity modification.  Onset of symptoms was gradual starting 2 years ago with gradually worsening course since that time.The patient noted no past surgery on the left hip(s).  Patient currently rates pain in the left hip at 8 out of 10 with activity. Patient has night pain, worsening of pain with activity and weight bearing, trendelenberg gait, pain that interfers with activities of daily living, pain with passive range of motion and crepitus. Patient has evidence of subchondral sclerosis and joint space narrowing by imaging studies. This condition presents safety issues increasing the risk of falls. This patient has had Intra-articular cortisone injection and epidural injection.  There is no current active infection.  Patient Active Problem List   Diagnosis Date Noted  . Encounter for screening colonoscopy 04/07/2014  . Dysphagia 04/02/2014  . Unspecified constipation 04/02/2014  . GERD (gastroesophageal reflux disease) 04/02/2013  . Chronic bronchitis with acute exacerbation ([place]) 04/02/2013  . Atypical chest pain 04/02/2013  . History of ovarian cancer 04/02/2013  . Tobacco abuse 04/02/2013  . Spinal stenosis, lumbar region, with neurogenic claudication 02/27/2013  . Iliotibial band tendonitis 02/27/2013  . DEGENERATIVE DISC DISEASE, LUMBOSACRAL SPINE 12/09/2009  . H N P-LUMBAR 09/29/2009  . SPINAL STENOSIS 09/29/2009  . KNEE, ARTHRITIS, DEGEN./OSTEO 09/23/2008  . KNEE PAIN 09/23/2008  . TOTAL KNEE FOLLOW-UP 09/23/2008  .  LOW BACK PAIN 05/14/2008  . SCIATICA 05/14/2008  . DIABETES 05/12/2008  . HIGH BLOOD PRESSURE 05/12/2008   Past Medical History  Diagnosis Date  . Diabetes mellitus   . Hypertension   . Cancer ([place])     ovarian  . COPD (chronic obstructive pulmonary disease) ([place])   . Arthritis     Past Surgical History  Procedure Laterality Date  . Replacement total knee      bilateral  . Cholecystectomy    . Colonoscopy  2007    Dr. Gala Romney: internal hemorrhoids, few scattered sigmoid diverticula  . Esophagogastroduodenoscopy  2008    Dr. Gala Romney: erosive esophagitis, patulous EG junction  . Colonoscopy N/A 04/09/2014    [id number] diverticulosis. Redundant colon. Single colonic polyp-removed as described above.  . Esophagogastroduodenoscopy N/A 04/09/2014    [id number] reflux esophagitis. Schatzki's ring-status post dilation as described above. Hiatal hernia. Gastricerosions-status post gastric biopsy  . Savory dilation N/A 04/09/2014    Procedure: SAVORY DILATION;  Surgeon: Daneil Dolin, MD;  [facility];  Service: Endoscopy;  Laterality: N/A;  Venia Minks dilation N/A 04/09/2014    Procedure: Venia Minks DILATION;  Surgeon: Daneil Dolin, MD;  Location: [facility];  Service: Endoscopy;  Laterality: N/A;  . Abdominal hysterectomy      BSO  . Abdominal surgery      for bleeding area after hysterectomy    No prescriptions prior to admission   Allergies  Allergen Reactions  . Gabapentin Other (See Comments)    suicidal thoughts    Social History  Substance Use Topics  . Smoking status: Former Smoker -- 1.00 packs/day for 54 years    Types: Cigarettes    Quit date: 10/05/2015  . Smokeless tobacco:  Not on file  . Alcohol Use: No    Family History  Problem Relation Age of Onset  . Colon cancer Neg Hx      Review of Systems  Constitutional: Negative.   HENT: Negative.   Eyes: Negative for pain.  Respiratory: Positive for shortness of breath.        Occasional   Cardiovascular: Negative for chest pain.  Gastrointestinal: Negative.   Genitourinary: Negative.   Musculoskeletal: Positive for myalgias, back pain and joint pain.  Skin: Negative.   Neurological: Positive for tingling and sensory change.  Endo/Heme/Allergies: Negative.   All other systems reviewed and are negative.   Objective:  Physical Exam  Constitutional: She is oriented to person, place, and time. She appears well-developed and well-nourished. No distress.  HENT:  Head: Normocephalic.  Eyes: Pupils are equal, round, and reactive to light. Right eye exhibits no discharge. Left eye exhibits no discharge. No scleral icterus.  Neck: Normal range of motion. Neck supple. No JVD present. No tracheal deviation present. No thyromegaly present.  Cardiovascular: Normal rate and intact distal pulses.   Respiratory: Effort normal. No stridor. No respiratory distress.  GI: Soft. Bowel sounds are normal. She exhibits no distension.  Musculoskeletal:  The right and left upper extremity: Normal alignment no tenderness no swelling full range of motion all joints reduced and stable muscle tone and strength normal skin intact no lacerations ulcerations or erythema pulses are intact and sensation is normal lymph nodes are negative  Lymphadenopathy:    She has no cervical adenopathy.  Neurological: She is alert and oriented to person, place, and time. She has normal reflexes. She displays normal reflexes. She exhibits normal muscle tone.  Skin: Skin is warm and dry. She is not diaphoretic.  Psychiatric: She has a normal mood and affect. Her behavior is normal. Judgment and thought content normal.  Right Hip Exam   Tenderness  The patient is experiencing no tenderness.     Range of Motion  The patient has normal right hip ROM.  Muscle Strength  The patient has normal right hip strength.  Other  Erythema: absent Scars: absent Sensation: normal Pulse: present  Comments:  Stable     Left Hip Exam   Tenderness  The patient is experiencing tenderness in the anterior.  Range of Motion  Extension: abnormal  Flexion:  100 abnormal  Internal Rotation: 5 abnormal  External Rotation: abnormal  Abduction: abnormal  Adduction: abnormal   Muscle Strength  Abduction: 5/5  Adduction: 5/5  Flexion: 4/5   Other  Erythema: absent Scars: absent Sensation: normal Pulse: present  Comments:  Stable       Vital signs in last 24 hours:    Labs:   Estimated body mass index is 43.10 kg/(m^2) as calculated from the following:   Height as of 11/01/15: 5\' 5"  (1.651 m).   Weight as of 11/26/15: 259 lb (117.482 kg).   Imaging Review Plain radiographs demonstrate moderate degenerative joint disease of the left hip(s). The bone quality appears to be good for age and reported activity level.  Assessment/Plan:  End stage arthritis, left hip(s)  The patient history, physical examination, clinical judgement of the provider and imaging studies are consistent with end stage degenerative joint disease of the left hip(s) and total hip arthroplasty is deemed medically necessary. The treatment options including medical management, injection therapy, arthroscopy and arthroplasty were discussed at length. The risks and benefits of total hip arthroplasty were presented and reviewed. The risks due to  aseptic loosening, infection, stiffness, dislocation/subluxation,  thromboembolic complications and other imponderables were discussed.  The patient acknowledged the explanation, agreed to proceed with the plan and consent was signed. Patient is being admitted for inpatient treatment for surgery, pain control, PT, OT, prophylactic antibiotics, VTE prophylaxis, progressive ambulation and ADL's and discharge planning.The patient is planning to be discharged home with home health services    BMET    Component Value Date/Time   NA 134* 12/03/2015 1320   K 4.1 12/03/2015 1320   CL 98*  12/03/2015 1320   CO2 26 12/03/2015 1320   GLUCOSE 160* 12/03/2015 1320   BUN 15 12/03/2015 1320   CREATININE 0.90 12/03/2015 1320   CALCIUM 9.2 12/03/2015 1320   GFRNONAA >60 12/03/2015 1320   GFRAA >60 12/03/2015 1320    CBC    Component Value Date/Time   WBC 8.7 12/03/2015 1320   RBC 4.29 12/03/2015 1320   HGB 13.1 12/03/2015 1320   HCT 39.3 12/03/2015 1320   PLT 347 12/03/2015 1320   MCV 91.6 12/03/2015 1320   MCH 30.5 12/03/2015 1320   MCHC 33.3 12/03/2015 1320   RDW 14.0 12/03/2015 1320   LYMPHSABS 2.7 12/03/2015 1320   MONOABS 0.6 12/03/2015 1320   EOSABS 0.1 12/03/2015 1320   BASOSABS 0.0 12/03/2015 1320

## 2015-12-07 NOTE — Telephone Encounter (Signed)
Per follow up with [employer] for date of surgery 12/08/15, CPT 27130 via online portal, a Pre-Auth/Notification Reference # was provided as follows: [id number]. (See note entered 11/29/15 per representative Pearline Cables. [facility] with this information.

## 2015-12-08 ENCOUNTER — Inpatient Hospital Stay (HOSPITAL_COMMUNITY): Payer: Medicare Other

## 2015-12-08 ENCOUNTER — Encounter (HOSPITAL_COMMUNITY): Payer: Self-pay | Admitting: *Deleted

## 2015-12-08 ENCOUNTER — Inpatient Hospital Stay (HOSPITAL_COMMUNITY): Payer: Medicare Other | Admitting: Anesthesiology

## 2015-12-08 ENCOUNTER — Inpatient Hospital Stay (HOSPITAL_COMMUNITY)
Admission: RE | Admit: 2015-12-08 | Discharge: 2015-12-11 | DRG: 470 | Disposition: A | Discharge status: Home / Self Care | Payer: Medicare Other | Source: Ambulatory Visit | Attending: Orthopedic Surgery | Admitting: Orthopedic Surgery

## 2015-12-08 ENCOUNTER — Encounter (HOSPITAL_COMMUNITY)
Admission: RE | Disposition: A | Discharge status: Home / Self Care | Payer: Self-pay | Source: Ambulatory Visit | Attending: Orthopedic Surgery

## 2015-12-08 DIAGNOSIS — J42 Unspecified chronic bronchitis: Secondary | ICD-10-CM | POA: Diagnosis not present

## 2015-12-08 DIAGNOSIS — M5126 Other intervertebral disc displacement, lumbar region: Secondary | ICD-10-CM | POA: Diagnosis present

## 2015-12-08 DIAGNOSIS — I10 Essential (primary) hypertension: Secondary | ICD-10-CM | POA: Diagnosis not present

## 2015-12-08 DIAGNOSIS — R278 Other lack of coordination: Secondary | ICD-10-CM | POA: Diagnosis not present

## 2015-12-08 DIAGNOSIS — Z96649 Presence of unspecified artificial hip joint: Secondary | ICD-10-CM

## 2015-12-08 DIAGNOSIS — E119 Type 2 diabetes mellitus without complications: Secondary | ICD-10-CM | POA: Diagnosis not present

## 2015-12-08 DIAGNOSIS — M5430 Sciatica, unspecified side: Secondary | ICD-10-CM | POA: Diagnosis not present

## 2015-12-08 DIAGNOSIS — F17210 Nicotine dependence, cigarettes, uncomplicated: Secondary | ICD-10-CM | POA: Diagnosis present

## 2015-12-08 DIAGNOSIS — G709 Myoneural disorder, unspecified: Secondary | ICD-10-CM | POA: Diagnosis present

## 2015-12-08 DIAGNOSIS — M1612 Unilateral primary osteoarthritis, left hip: Secondary | ICD-10-CM | POA: Diagnosis not present

## 2015-12-08 DIAGNOSIS — T84021A Dislocation of internal left hip prosthesis, initial encounter: Secondary | ICD-10-CM | POA: Diagnosis not present

## 2015-12-08 DIAGNOSIS — R262 Difficulty in walking, not elsewhere classified: Secondary | ICD-10-CM | POA: Diagnosis not present

## 2015-12-08 DIAGNOSIS — K219 Gastro-esophageal reflux disease without esophagitis: Secondary | ICD-10-CM | POA: Diagnosis not present

## 2015-12-08 DIAGNOSIS — Z96642 Presence of left artificial hip joint: Secondary | ICD-10-CM | POA: Diagnosis not present

## 2015-12-08 DIAGNOSIS — J449 Chronic obstructive pulmonary disease, unspecified: Secondary | ICD-10-CM | POA: Diagnosis not present

## 2015-12-08 DIAGNOSIS — C569 Malignant neoplasm of unspecified ovary: Secondary | ICD-10-CM | POA: Diagnosis present

## 2015-12-08 DIAGNOSIS — M4806 Spinal stenosis, lumbar region: Secondary | ICD-10-CM | POA: Diagnosis present

## 2015-12-08 DIAGNOSIS — K5900 Constipation, unspecified: Secondary | ICD-10-CM | POA: Diagnosis not present

## 2015-12-08 DIAGNOSIS — M6281 Muscle weakness (generalized): Secondary | ICD-10-CM | POA: Diagnosis not present

## 2015-12-08 DIAGNOSIS — M1990 Unspecified osteoarthritis, unspecified site: Secondary | ICD-10-CM | POA: Diagnosis not present

## 2015-12-08 DIAGNOSIS — Z471 Aftercare following joint replacement surgery: Secondary | ICD-10-CM | POA: Diagnosis not present

## 2015-12-08 DIAGNOSIS — M545 Low back pain: Secondary | ICD-10-CM | POA: Diagnosis not present

## 2015-12-08 HISTORY — PX: TOTAL HIP ARTHROPLASTY: SHX124

## 2015-12-08 HISTORY — PX: OTHER SURGICAL HISTORY: SHX169

## 2015-12-08 LAB — GLUCOSE, CAPILLARY
Glucose-Capillary: 158 mg/dL — ABNORMAL HIGH (ref 65–99)
Glucose-Capillary: 116 mg/dL — ABNORMAL HIGH (ref 65–99)
Glucose-Capillary: 124 mg/dL — ABNORMAL HIGH (ref 65–99)

## 2015-12-08 SURGERY — ARTHROPLASTY, HIP, TOTAL,POSTERIOR APPROACH
Anesthesia: Spinal | Site: Hip | Laterality: Left

## 2015-12-08 MED ORDER — FENTANYL CITRATE (PF) 100 MCG/2ML IJ SOLN
INTRAMUSCULAR | Status: DC | PRN
  Administered 2015-12-08: 25 ug via INTRATHECAL
  Administered 2015-12-08 (×3): 25 ug via INTRAVENOUS

## 2015-12-08 MED ORDER — LIDOCAINE HCL (PF) 1 % IJ SOLN
INTRAMUSCULAR | Status: AC
  Filled 2015-12-08: qty 5

## 2015-12-08 MED ORDER — BISACODYL 5 MG PO TBEC: 5.0000 mg | DELAYED_RELEASE_TABLET | Freq: Every day | ORAL | Status: DC | PRN

## 2015-12-08 MED ORDER — VERAPAMIL HCL ER 240 MG PO TBCR
240.0000 mg | EXTENDED_RELEASE_TABLET | Freq: Every morning | ORAL | Status: DC
  Administered 2015-12-09 – 2015-12-11 (×3): 240 mg via ORAL
  Filled 2015-12-08 (×3): qty 1

## 2015-12-08 MED ORDER — CELECOXIB 100 MG PO CAPS
400.0000 mg | ORAL_CAPSULE | Freq: Once | ORAL | Status: AC
  Administered 2015-12-08: 400 mg via ORAL
  Filled 2015-12-08: qty 4

## 2015-12-08 MED ORDER — PROPOFOL 10 MG/ML IV BOLUS
INTRAVENOUS | Status: AC
  Filled 2015-12-08: qty 20

## 2015-12-08 MED ORDER — CHLORHEXIDINE GLUCONATE 4 % EX LIQD: 60.0000 mL | Freq: Once | CUTANEOUS | Status: DC

## 2015-12-08 MED ORDER — MAGNESIUM CITRATE PO SOLN: 1.0000 | Freq: Once | ORAL | Status: DC | PRN

## 2015-12-08 MED ORDER — PHENYLEPHRINE 40 MCG/ML (10ML) SYRINGE FOR IV PUSH (FOR BLOOD PRESSURE SUPPORT)
PREFILLED_SYRINGE | INTRAVENOUS | Status: AC
  Filled 2015-12-08: qty 10

## 2015-12-08 MED ORDER — PREGABALIN 50 MG PO CAPS
50.0000 mg | ORAL_CAPSULE | Freq: Once | ORAL | Status: AC
  Administered 2015-12-08: 50 mg via ORAL
  Filled 2015-12-08: qty 1

## 2015-12-08 MED ORDER — ACETAMINOPHEN 500 MG PO TABS
1000.0000 mg | ORAL_TABLET | Freq: Four times a day (QID) | ORAL | Status: AC
  Administered 2015-12-08 – 2015-12-09 (×4): 1000 mg via ORAL
  Filled 2015-12-08 (×4): qty 2

## 2015-12-08 MED ORDER — BUPIVACAINE-EPINEPHRINE (PF) 0.5% -1:200000 IJ SOLN
INTRAMUSCULAR | Status: AC
  Filled 2015-12-08: qty 30

## 2015-12-08 MED ORDER — DEXAMETHASONE SODIUM PHOSPHATE 10 MG/ML IJ SOLN
10.0000 mg | Freq: Once | INTRAMUSCULAR | Status: AC
  Administered 2015-12-09: 10 mg via INTRAVENOUS
  Filled 2015-12-08: qty 1

## 2015-12-08 MED ORDER — SUCCINYLCHOLINE CHLORIDE 20 MG/ML IJ SOLN
INTRAMUSCULAR | Status: AC
  Filled 2015-12-08: qty 1

## 2015-12-08 MED ORDER — PROPOFOL 500 MG/50ML IV EMUL
INTRAVENOUS | Status: DC | PRN
  Administered 2015-12-08 (×2): via INTRAVENOUS
  Administered 2015-12-08: 25 ug/kg/min via INTRAVENOUS

## 2015-12-08 MED ORDER — FENTANYL CITRATE (PF) 100 MCG/2ML IJ SOLN
25.0000 ug | INTRAMUSCULAR | Status: AC
  Administered 2015-12-08 (×2): 25 ug via INTRAVENOUS

## 2015-12-08 MED ORDER — PHENOL 1.4 % MT LIQD: 1.0000 | OROMUCOSAL | Status: DC | PRN

## 2015-12-08 MED ORDER — CEFAZOLIN SODIUM-DEXTROSE 2-4 GM/100ML-% IV SOLN
2.0000 g | Freq: Four times a day (QID) | INTRAVENOUS | Status: AC
  Administered 2015-12-08 (×2): 2 g via INTRAVENOUS
  Filled 2015-12-08 (×2): qty 100

## 2015-12-08 MED ORDER — DIPHENHYDRAMINE HCL 12.5 MG/5ML PO ELIX: 12.5000 mg | ORAL_SOLUTION | ORAL | Status: DC | PRN

## 2015-12-08 MED ORDER — ONDANSETRON HCL 4 MG PO TABS: 4.0000 mg | ORAL_TABLET | Freq: Four times a day (QID) | ORAL | Status: DC | PRN

## 2015-12-08 MED ORDER — CALCIUM CARBONATE 1250 (500 CA) MG PO TABS
1.0000 | ORAL_TABLET | Freq: Every day | ORAL | Status: DC
  Administered 2015-12-08 – 2015-12-11 (×4): 500 mg via ORAL
  Filled 2015-12-08 (×7): qty 1

## 2015-12-08 MED ORDER — MIDAZOLAM HCL 5 MG/5ML IJ SOLN
INTRAMUSCULAR | Status: DC | PRN
  Administered 2015-12-08: 2 mg via INTRAVENOUS

## 2015-12-08 MED ORDER — ALBUTEROL SULFATE (2.5 MG/3ML) 0.083% IN NEBU: 2.5000 mg | INHALATION_SOLUTION | RESPIRATORY_TRACT | Status: DC | PRN

## 2015-12-08 MED ORDER — HYDROCODONE-ACETAMINOPHEN 7.5-325 MG PO TABS
1.0000 | ORAL_TABLET | Freq: Four times a day (QID) | ORAL | Status: DC
  Administered 2015-12-08 – 2015-12-11 (×11): 1 via ORAL
  Filled 2015-12-08 (×12): qty 1

## 2015-12-08 MED ORDER — LISINOPRIL 10 MG PO TABS
10.0000 mg | ORAL_TABLET | Freq: Every day | ORAL | Status: DC
  Administered 2015-12-08 – 2015-12-11 (×4): 10 mg via ORAL
  Filled 2015-12-08 (×4): qty 1

## 2015-12-08 MED ORDER — SODIUM CHLORIDE 0.9 % IJ SOLN
INTRAMUSCULAR | Status: AC
  Filled 2015-12-08: qty 40

## 2015-12-08 MED ORDER — BUPIVACAINE HCL (PF) 0.75 % IJ SOLN: INTRAMUSCULAR | Status: DC | PRN

## 2015-12-08 MED ORDER — METHOCARBAMOL 1000 MG/10ML IJ SOLN
500.0000 mg | Freq: Once | INTRAMUSCULAR | Status: DC
  Filled 2015-12-08: qty 5

## 2015-12-08 MED ORDER — ATENOLOL 25 MG PO TABS
50.0000 mg | ORAL_TABLET | Freq: Every day | ORAL | Status: DC
  Administered 2015-12-09 – 2015-12-11 (×3): 50 mg via ORAL
  Filled 2015-12-08 (×3): qty 2

## 2015-12-08 MED ORDER — METHOCARBAMOL 1000 MG/10ML IJ SOLN
500.0000 mg | Freq: Four times a day (QID) | INTRAVENOUS | Status: DC | PRN
  Administered 2015-12-08: 500 mg via INTRAVENOUS
  Filled 2015-12-08: qty 5

## 2015-12-08 MED ORDER — ONDANSETRON HCL 4 MG/2ML IJ SOLN: 4.0000 mg | Freq: Once | INTRAMUSCULAR | Status: DC | PRN

## 2015-12-08 MED ORDER — MIDAZOLAM HCL 2 MG/2ML IJ SOLN
INTRAMUSCULAR | Status: AC
  Filled 2015-12-08: qty 2

## 2015-12-08 MED ORDER — METOCLOPRAMIDE HCL 10 MG PO TABS: 5.0000 mg | ORAL_TABLET | Freq: Three times a day (TID) | ORAL | Status: DC | PRN

## 2015-12-08 MED ORDER — FENTANYL CITRATE (PF) 100 MCG/2ML IJ SOLN
INTRAMUSCULAR | Status: AC
Start: 2015-12-08 — End: 2015-12-08
  Filled 2015-12-08: qty 2

## 2015-12-08 MED ORDER — BUPIVACAINE-EPINEPHRINE (PF) 0.5% -1:200000 IJ SOLN
INTRAMUSCULAR | Status: DC | PRN
  Administered 2015-12-08: 30 mL via PERINEURAL

## 2015-12-08 MED ORDER — CEFAZOLIN SODIUM-DEXTROSE 2-3 GM-% IV SOLR
INTRAVENOUS | Status: AC
  Filled 2015-12-08: qty 50

## 2015-12-08 MED ORDER — BUPIVACAINE IN DEXTROSE 0.75-8.25 % IT SOLN
INTRATHECAL | Status: AC
  Filled 2015-12-08: qty 2

## 2015-12-08 MED ORDER — POLYETHYLENE GLYCOL 3350 17 G PO PACK
17.0000 g | PACK | Freq: Every day | ORAL | Status: DC
  Administered 2015-12-08 – 2015-12-11 (×4): 17 g via ORAL
  Filled 2015-12-08 (×4): qty 1

## 2015-12-08 MED ORDER — LACTATED RINGERS IV SOLN
INTRAVENOUS | Status: DC
Start: 2015-12-08 — End: 2015-12-08
  Administered 2015-12-08 (×2): via INTRAVENOUS

## 2015-12-08 MED ORDER — MENTHOL 3 MG MT LOZG: 1.0000 | LOZENGE | OROMUCOSAL | Status: DC | PRN

## 2015-12-08 MED ORDER — BUPIVACAINE LIPOSOME 1.3 % IJ SUSP
20.0000 mL | Freq: Once | INTRAMUSCULAR | Status: DC
Start: 2015-12-08 — End: 2015-12-08
  Filled 2015-12-08: qty 20

## 2015-12-08 MED ORDER — ALUM & MAG HYDROXIDE-SIMETH 200-200-20 MG/5ML PO SUSP: 30.0000 mL | ORAL | Status: DC | PRN

## 2015-12-08 MED ORDER — ALBUTEROL SULFATE HFA 108 (90 BASE) MCG/ACT IN AERS: 2.0000 | INHALATION_SPRAY | RESPIRATORY_TRACT | Status: DC | PRN

## 2015-12-08 MED ORDER — ASPIRIN EC 325 MG PO TBEC
325.0000 mg | DELAYED_RELEASE_TABLET | Freq: Two times a day (BID) | ORAL | Status: DC
  Administered 2015-12-08 – 2015-12-11 (×7): 325 mg via ORAL
  Filled 2015-12-08 (×7): qty 1

## 2015-12-08 MED ORDER — GLYCOPYRROLATE 0.2 MG/ML IJ SOLN
INTRAMUSCULAR | Status: AC
  Filled 2015-12-08: qty 1

## 2015-12-08 MED ORDER — HYDROCODONE-ACETAMINOPHEN 5-325 MG PO TABS
1.0000 | ORAL_TABLET | Freq: Once | ORAL | Status: AC
  Administered 2015-12-08: 1 via ORAL
  Filled 2015-12-08: qty 1

## 2015-12-08 MED ORDER — BUPIVACAINE IN DEXTROSE 0.75-8.25 % IT SOLN
INTRATHECAL | Status: DC | PRN
  Administered 2015-12-08: 14 mg via INTRATHECAL

## 2015-12-08 MED ORDER — HYDROMORPHONE HCL 1 MG/ML IJ SOLN
0.5000 mg | INTRAMUSCULAR | Status: DC | PRN
  Administered 2015-12-09: 0.5 mg via INTRAVENOUS
  Filled 2015-12-08: qty 1

## 2015-12-08 MED ORDER — FENTANYL CITRATE (PF) 100 MCG/2ML IJ SOLN
25.0000 ug | INTRAMUSCULAR | Status: DC | PRN
  Administered 2015-12-08: 50 ug via INTRAVENOUS
  Administered 2015-12-08 (×2): 25 ug via INTRAVENOUS
  Filled 2015-12-08: qty 2

## 2015-12-08 MED ORDER — CEFAZOLIN SODIUM-DEXTROSE 2-3 GM-% IV SOLR
2.0000 g | INTRAVENOUS | Status: AC
  Administered 2015-12-08: 2 g via INTRAVENOUS

## 2015-12-08 MED ORDER — TRIAMTERENE-HCTZ 37.5-25 MG PO TABS
1.0000 | ORAL_TABLET | Freq: Every day | ORAL | Status: DC
  Administered 2015-12-08 – 2015-12-11 (×4): 1 via ORAL
  Filled 2015-12-08 (×4): qty 1

## 2015-12-08 MED ORDER — METHOCARBAMOL 500 MG PO TABS: 500.0000 mg | ORAL_TABLET | Freq: Four times a day (QID) | ORAL | Status: DC | PRN

## 2015-12-08 MED ORDER — ONDANSETRON HCL 4 MG/2ML IJ SOLN
4.0000 mg | Freq: Once | INTRAMUSCULAR | Status: DC
  Filled 2015-12-08: qty 2

## 2015-12-08 MED ORDER — MIDAZOLAM HCL 2 MG/2ML IJ SOLN
1.0000 mg | INTRAMUSCULAR | Status: DC | PRN
  Administered 2015-12-08: 2 mg via INTRAVENOUS

## 2015-12-08 MED ORDER — EPHEDRINE SULFATE 50 MG/ML IJ SOLN
INTRAMUSCULAR | Status: AC
  Filled 2015-12-08: qty 1

## 2015-12-08 MED ORDER — OXYCODONE HCL 5 MG PO TABS
5.0000 mg | ORAL_TABLET | ORAL | Status: DC | PRN
  Administered 2015-12-08 (×2): 5 mg via ORAL
  Filled 2015-12-08 (×2): qty 1

## 2015-12-08 MED ORDER — INSULIN NPH (HUMAN) (ISOPHANE) 100 UNIT/ML ~~LOC~~ SUSP
30.0000 [IU] | Freq: Every day | SUBCUTANEOUS | Status: DC
  Administered 2015-12-09 – 2015-12-11 (×3): 30 [IU] via SUBCUTANEOUS
  Filled 2015-12-08: qty 10

## 2015-12-08 MED ORDER — SODIUM CHLORIDE 0.9 % IV SOLN
INTRAVENOUS | Status: DC | PRN
  Administered 2015-12-08: 60 mL

## 2015-12-08 MED ORDER — SODIUM CHLORIDE 0.9 % IJ SOLN
INTRAMUSCULAR | Status: AC
  Filled 2015-12-08: qty 10

## 2015-12-08 MED ORDER — GLYCOPYRROLATE 0.2 MG/ML IJ SOLN
INTRAMUSCULAR | Status: DC | PRN
  Administered 2015-12-08: 0.2 mg via INTRAVENOUS

## 2015-12-08 MED ORDER — FENTANYL CITRATE (PF) 100 MCG/2ML IJ SOLN
INTRAMUSCULAR | Status: AC
  Filled 2015-12-08: qty 2

## 2015-12-08 MED ORDER — VITAMIN D 1000 UNITS PO TABS
1000.0000 [IU] | ORAL_TABLET | Freq: Every day | ORAL | Status: DC
  Administered 2015-12-08 – 2015-12-11 (×4): 1000 [IU] via ORAL
  Filled 2015-12-08 (×4): qty 1

## 2015-12-08 MED ORDER — ONDANSETRON HCL 4 MG/2ML IJ SOLN: 4.0000 mg | Freq: Four times a day (QID) | INTRAMUSCULAR | Status: DC | PRN

## 2015-12-08 MED ORDER — INSULIN ASPART 100 UNIT/ML ~~LOC~~ SOLN
15.0000 [IU] | Freq: Three times a day (TID) | SUBCUTANEOUS | Status: DC
  Administered 2015-12-08 – 2015-12-11 (×8): 15 [IU] via SUBCUTANEOUS

## 2015-12-08 MED ORDER — LIDOCAINE HCL (CARDIAC) 10 MG/ML IV SOLN
INTRAVENOUS | Status: DC | PRN
  Administered 2015-12-08: 25 mg via INTRAVENOUS

## 2015-12-08 MED ORDER — METOCLOPRAMIDE HCL 5 MG/ML IJ SOLN: 5.0000 mg | Freq: Three times a day (TID) | INTRAMUSCULAR | Status: DC | PRN

## 2015-12-08 MED ORDER — SODIUM CHLORIDE 0.9 % IR SOLN
Status: DC | PRN
  Administered 2015-12-08: 1000 mL

## 2015-12-08 MED ORDER — SODIUM CHLORIDE 0.9 % IR SOLN
Status: DC | PRN
  Administered 2015-12-08: 3000 mL

## 2015-12-08 MED ORDER — ARTIFICIAL TEARS OP OINT
TOPICAL_OINTMENT | OPHTHALMIC | Status: AC
  Filled 2015-12-08: qty 3.5

## 2015-12-08 MED ORDER — DOCUSATE SODIUM 100 MG PO CAPS
100.0000 mg | ORAL_CAPSULE | Freq: Two times a day (BID) | ORAL | Status: DC
  Administered 2015-12-08 – 2015-12-11 (×7): 100 mg via ORAL
  Filled 2015-12-08 (×7): qty 1

## 2015-12-08 MED ORDER — SODIUM CHLORIDE 0.9 % IV SOLN
INTRAVENOUS | Status: DC
  Administered 2015-12-08 (×2): via INTRAVENOUS

## 2015-12-08 MED ORDER — BUPIVACAINE LIPOSOME 1.3 % IJ SUSP
INTRAMUSCULAR | Status: AC
  Filled 2015-12-08: qty 20

## 2015-12-08 SURGICAL SUPPLY — 71 items
BIT DRILL 2.8X128 (BIT) ×2 IMPLANT
BIT DRILL 2.8X128MM (BIT) ×1
BLADE HEX COATED 2.75 (ELECTRODE) ×3 IMPLANT
BLADE SAGITTAL 25.0X1.27X90 (BLADE) ×2 IMPLANT
BLADE SAGITTAL 25.0X1.27X90MM (BLADE) ×1
BRUSH FEMORAL CANAL (MISCELLANEOUS) IMPLANT
CAPT HIP TOTAL 2 ×3 IMPLANT
CLOTH BEACON ORANGE TIMEOUT ST (SAFETY) ×3 IMPLANT
COVER LIGHT HANDLE STERIS (MISCELLANEOUS) ×6 IMPLANT
COVER PROBE W GEL 5X96 (DRAPES) ×3 IMPLANT
DECANTER SPIKE VIAL GLASS SM (MISCELLANEOUS) ×6 IMPLANT
DRAPE BACK TABLE (DRAPES) ×3 IMPLANT
DRAPE HIP W/POCKET STRL (DRAPE) ×3 IMPLANT
DRAPE INCISE IOBAN 44X35 STRL (DRAPES) ×3 IMPLANT
DRAPE U-SHAPE 47X51 STRL (DRAPES) ×3 IMPLANT
DRESSING AQUACEL AG ADV 3.5X12 (MISCELLANEOUS) ×1 IMPLANT
DRSG AQUACEL AG ADV 3.5X10 (GAUZE/BANDAGES/DRESSINGS) ×6 IMPLANT
DRSG AQUACEL AG ADV 3.5X12 (MISCELLANEOUS) ×3
DRSG MEPILEX BORDER 4X12 (GAUZE/BANDAGES/DRESSINGS) IMPLANT
DURAPREP 26ML APPLICATOR (WOUND CARE) ×6 IMPLANT
ELECT REM PT RETURN 9FT ADLT (ELECTROSURGICAL) ×3
ELECTRODE REM PT RTRN 9FT ADLT (ELECTROSURGICAL) ×1 IMPLANT
ELIMINATOR HOLE APEX DEPUY (Hips) IMPLANT
EVACUATOR 3/16  PVC DRAIN (DRAIN)
EVACUATOR 3/16 PVC DRAIN (DRAIN) IMPLANT
GLOVE BIO SURGEON STRL SZ7 (GLOVE) ×9 IMPLANT
GLOVE BIOGEL PI IND STRL 7.0 (GLOVE) ×4 IMPLANT
GLOVE BIOGEL PI INDICATOR 7.0 (GLOVE) ×8
GLOVE OPTIFIT SS 8.0 STRL (GLOVE) ×3 IMPLANT
GLOVE SKINSENSE NS SZ8.0 LF (GLOVE) ×2
GLOVE SKINSENSE STRL SZ8.0 LF (GLOVE) ×1 IMPLANT
GLOVE SS N UNI LF 8.5 STRL (GLOVE) ×3 IMPLANT
GOWN STRL REUS W/TWL LRG LVL3 (GOWN DISPOSABLE) ×9 IMPLANT
GOWN STRL REUS W/TWL XL LVL3 (GOWN DISPOSABLE) ×3 IMPLANT
HANDPIECE INTERPULSE COAX TIP (DISPOSABLE) ×2
HOOD W/PEELAWAY (MISCELLANEOUS) ×12 IMPLANT
INST SET MAJOR BONE (KITS) ×3 IMPLANT
IV NS IRRIG 3000ML ARTHROMATIC (IV SOLUTION) ×3 IMPLANT
KIT BLADEGUARD II DBL (SET/KITS/TRAYS/PACK) ×3 IMPLANT
KIT ROOM TURNOVER APOR (KITS) ×3 IMPLANT
MANIFOLD NEPTUNE II (INSTRUMENTS) ×3 IMPLANT
MARKER SKIN DUAL TIP RULER LAB (MISCELLANEOUS) ×3 IMPLANT
NEEDLE HYPO 18GX1.5 BLUNT FILL (NEEDLE) ×6 IMPLANT
NEEDLE HYPO 21X1.5 SAFETY (NEEDLE) ×3 IMPLANT
NEEDLE HYPO 25X1 1.5 SAFETY (NEEDLE) ×3 IMPLANT
NS IRRIG 1000ML POUR BTL (IV SOLUTION) ×3 IMPLANT
PACK TOTAL JOINT (CUSTOM PROCEDURE TRAY) ×3 IMPLANT
PAD ARMBOARD 7.5X6 YLW CONV (MISCELLANEOUS) ×3 IMPLANT
PASSER SUT SWANSON 36MM LOOP (INSTRUMENTS) IMPLANT
PILLOW HIP ABDUCTION LRG (ORTHOPEDIC SUPPLIES) IMPLANT
PILLOW HIP ABDUCTION MED (ORTHOPEDIC SUPPLIES) ×3 IMPLANT
PIN STMN SNGL STERILE 9X3.6MM (PIN) ×3 IMPLANT
SET BASIN LINEN APH (SET/KITS/TRAYS/PACK) ×3 IMPLANT
SET HNDPC FAN SPRY TIP SCT (DISPOSABLE) ×1 IMPLANT
SPONGE LAP 18X18 X RAY DECT (DISPOSABLE) ×6 IMPLANT
STAPLER VISISTAT 35W (STAPLE) ×6 IMPLANT
SUT BRALON NAB BRD #1 30IN (SUTURE) ×6 IMPLANT
SUT ETHIBOND 5 LR DA (SUTURE) ×6 IMPLANT
SUT MNCRL 0 VIOLET CTX 36 (SUTURE) ×2 IMPLANT
SUT MON AB 2-0 CT1 36 (SUTURE) ×3 IMPLANT
SUT MONOCRYL 0 CTX 36 (SUTURE) ×4
SUT VIC AB 1 CT1 27 (SUTURE) ×4
SUT VIC AB 1 CT1 27XBRD ANTBC (SUTURE) ×2 IMPLANT
SYR 20CC LL (SYRINGE) ×3 IMPLANT
SYR 30ML LL (SYRINGE) ×3 IMPLANT
SYR BULB IRRIGATION 50ML (SYRINGE) ×3 IMPLANT
TOWEL OR 17X26 4PK STRL BLUE (TOWEL DISPOSABLE) ×3 IMPLANT
TOWER CARTRIDGE SMART MIX (DISPOSABLE) ×3 IMPLANT
TRAY FOLEY CATH SILVER 16FR (SET/KITS/TRAYS/PACK) ×3 IMPLANT
YANKAUER SUCT 12FT TUBE ARGYLE (SUCTIONS) IMPLANT
YANKAUER SUCT BULB TIP NO VENT (SUCTIONS) ×6 IMPLANT

## 2015-12-08 NOTE — Anesthesia Postprocedure Evaluation (Addendum)
Anesthesia Post Note Late Entry  Patient: [name]  Procedure(s) Performed: Procedure(s) (LRB): TOTAL HIP ARTHROPLASTY (Left)  Patient location during evaluation: PACU Anesthesia Type: MAC Level of consciousness: awake and alert and oriented Pain management: pain level controlled Vital Signs Assessment: post-procedure vital signs reviewed and stable Respiratory status: spontaneous breathing Cardiovascular status: stable Postop Assessment: no signs of nausea or vomiting Anesthetic complications: no    Last Vitals:  Filed Vitals:   12/08/15 0825 12/08/15 1145  BP: 166/82   Pulse:    Temp:  36.8 C  Resp: 23     Last Pain:  Filed Vitals:   12/08/15 1221  PainSc: 9                  ADAMS, AMY A

## 2015-12-08 NOTE — Transfer of Care (Signed)
Immediate Anesthesia Transfer of Care Note  Patient: [name]  Procedure(s) Performed: Procedure(s): TOTAL HIP ARTHROPLASTY (Left)  Patient Location: PACU  Anesthesia Type:Spinal  Level of Consciousness: awake, alert , oriented and patient cooperative  Airway & Oxygen Therapy: Patient Spontanous Breathing and Patient connected to nasal cannula oxygen  Post-op Assessment: Report given to RN and Post -op Vital signs reviewed and stable  Post vital signs: Reviewed and stable  Last Vitals:  Filed Vitals:   12/08/15 0820 12/08/15 0825  BP: 173/87 166/82  Pulse:    Temp:    Resp: 22 23    Complications: No apparent anesthesia complications

## 2015-12-08 NOTE — Anesthesia Preprocedure Evaluation (Addendum)
Anesthesia Evaluation  Patient identified by MRN, date of birth, ID band Patient awake    Reviewed: Allergy & Precautions, NPO status , Patient's Chart, lab work & pertinent test results  Airway Mallampati: II  TM Distance: >3 FB     Dental  (+) Edentulous Upper, Edentulous Lower   Pulmonary COPD, former smoker,    breath sounds clear to auscultation       Cardiovascular hypertension, Pt. on medications  Rhythm:Regular Rate:Normal     Neuro/Psych  Neuromuscular disease    GI/Hepatic GERD  Medicated and Controlled,  Endo/Other  diabetes, Type 2  Renal/GU      Musculoskeletal  (+) Arthritis ,   Abdominal   Peds  Hematology   Anesthesia Other Findings   Reproductive/Obstetrics                            Anesthesia Physical Anesthesia Plan  ASA: III  Anesthesia Plan: Spinal   Post-op Pain Management:    Induction:   Airway Management Planned: Simple Face Mask  Additional Equipment:   Intra-op Plan:   Post-operative Plan:   Informed Consent: I have reviewed the patients History and Physical, chart, labs and discussed the procedure including the risks, benefits and alternatives for the proposed anesthesia with the patient or authorized representative who has indicated his/her understanding and acceptance.     Plan Discussed with:   Anesthesia Plan Comments: (Possible GOT-RSI explained to pt and she agrees with plan.)        Anesthesia Quick Evaluation

## 2015-12-08 NOTE — Brief Op Note (Addendum)
12/08/2015  11:35 AM  PATIENT:  [name]  65 y.o. female  PRE-OPERATIVE DIAGNOSIS:  osteoarthritis left hip  POST-OPERATIVE DIAGNOSIS:  osteoarthritis left hip  Depuy: 3 press fit stem, 50 grition cup +4 32 poly and a 32 metal +5 head   PROCEDURE:  Procedure(s): TOTAL HIP ARTHROPLASTY (Left)  SURGEON:  Surgeon(s) and Role:    * Carole Civil, MD - Primary  Assisted by Patsi Sears and Elwin Mocha   PHYSICIAN ASSISTANT:     ANESTHESIA:   spinal  EBL:  Total I/O In: 1700 [I.V.:1700] Out: 350 [Urine:200; Blood:150]  BLOOD ADMINISTERED:none  DRAINS: none   LOCAL MEDICATIONS USED:  MARCAINE   , Amount: 30 ml and OTHER exparel 20 diltued to 60 cc with saline   SPECIMEN:  No Specimen  DISPOSITION OF SPECIMEN:  N/A  COUNTS:  YES  TOURNIQUET:  * No tourniquets in log *  DICTATION: .Dragon Dictation  PLAN OF CARE: Admit to inpatient   PATIENT DISPOSITION:  PACU - hemodynamically stable.   Delay start of Pharmacological VTE agent (>24hrs) due to surgical blood loss or risk of bleeding: yes

## 2015-12-08 NOTE — Anesthesia Procedure Notes (Addendum)
Date/Time: 12/08/2015 8:27 AM Performed by: Andree Elk, Kyreese Chio A Pre-anesthesia Checklist: Patient identified, Timeout performed, Emergency Drugs available, Suction available and Patient being monitored Oxygen Delivery Method: Simple face mask   Spinal Patient location during procedure: OR Start time: 12/08/2015 9:01 AM Staffing Anesthesiologist: Lerry Liner Resident/CRNA: Samyria Rudie A Preanesthetic Checklist Completed: patient identified, site marked, surgical consent, pre-op evaluation, timeout performed, IV checked, risks and benefits discussed and monitors and equipment checked Spinal Block Patient position: left lateral decubitus Prep: Betadine Patient monitoring: heart rate, cardiac monitor, continuous pulse ox and blood pressure Approach: left paramedian Location: L3-4 Injection technique: single-shot Needle Needle type: Spinocan  Needle gauge: 22 G Needle length: 12.7 cm Assessment Sensory level: T8 Additional Notes  ATTEMPTS:2 TRAY [id number] TRAY EXPIRATION DATE:2017-01-15 Attempt 1x by me; Spinal placed by Dr. Duwayne Heck x1 attempt; patient tolerated well

## 2015-12-08 NOTE — Op Note (Signed)
12/08/2015  11:35 AM  PATIENT:  [name]  65 y.o. female  PRE-OPERATIVE DIAGNOSIS:  osteoarthritis left hip  POST-OPERATIVE DIAGNOSIS:  osteoarthritis left hip  Depuy: 3 press fit stem,//50 gription cup with 1 screw size 30// +4-32 poly and a 32 metal +5 head   PROCEDURE:  Procedure(s): TOTAL HIP ARTHROPLASTY (Left)  SURGEON:  Surgeon(s) and Role:    * Carole Civil, MD - Primary  Assisted by Oliver   Surgical dictation for LEFT TOTAL  hip replacement  The patient was identified in the preoperative holding area. The SURGICAL SITE (LEFT HIP) was CONFIRMED AND marked. The chart was reviewed and updated. The patient was taken to the operating room for spinal anesthesia. A Foley catheter was inserted. The patient was placed in lateral decubitus position with the operative site up (LEFT HIP) . A Stulberg positioner was used to hold the patient in place and axillary roll was provided as needed.  Standard preoperative antibiotics were given per protocol using SCIP recommendations.  After the sterile prep and drape the timeout was completed. All implants were accounted for AND  x-rays were visible and everyone agreed on the surgical site AS LEFT HIP     The incision was made over the LEFT greater trochanter and deepened through subcutaneous tissue. The fascia was exposed and incised in line with the skin incision. The greater trochanteric bursa was excised. Blunt dissection was carried out in line of the fibers of the gluteus medius. The gluteus medius was subperiosteally dissected from the greater trochanter in line with the vastus lateralis and included the gluteus minimus. These structures were tagged and retracted proximally. 2 Steinmann pins were placed in the pelvis as retractors.  The hip capsule was excised and the hip was exposed. The hip was dislocated anteriorly. A cutting guide was used to perform the proximal neck cut. The head was  removed.  The acetabulum was evaluated and labrum and osteophytes were resected anteriorly and posterior retractors were placed.  Operative findings: The femoral head was devoid of cartilage approximate 75% of its surface and approximately 50% of the superior. Acetabulum showed significant wear and loss of cartilage  Reaming was initiated with a 43 reamer and progressively this increased up to a size 50. We then placed a trial acetabular shell 48 size down from the last reaming.  Bone graft was using reamings  The acetabular shell was placed and checked for security.  1 Screws was placed for security  We next turned our attention to the femur. A starter hole reamer was passed followed by box osteotome followed by canal finder.  We broached the canal up to a size 3  We then performed trial reduction with a size 3 femur size 50 acetabulum and a size 32 head with +1 and then +5 neck length.  Leg length was confirmed with the +5 neck length high offset first followed by shuck test followed by flexion internal rotation test followed by external rotation extension test followed by sleep test.  Once I was satisfied with the reduction and stability the trial components were removed. Drill holes were passed through the greater trochanter and #5 Ethibond suture was passed through the drill holes. The implant was placed. The size Blank head was placed and the hip was reduced  The hip was taken back through the same test as performed during the trial and I was satisfied with the reduction and stability  The wound was irrigated thoroughly. The  abductors were repaired including the vastus lateralis with #1 Bralon and #5 Ethibond suture. The hip was then abducted and the fascia was closed with #1 Bralon suture.  Skin was closed with 0 Monocryl suture.  Skin staples were used to reapproximate the skin edges  Exparel dilute 20cc was used along with Marcaine with epinephrine ANESTHESIA:    spinal  EBL:  Total I/O In: 1700 [I.V.:1700] Out: 350 [Urine:200; Blood:150]  BLOOD ADMINISTERED:none  DRAINS: none   LOCAL MEDICATIONS USED:  MARCAINE   , Amount: 30 ml and OTHER exparel 20 diltued to 60 cc with saline   SPECIMEN:  No Specimen  DISPOSITION OF SPECIMEN:  N/A  COUNTS:  YES  TOURNIQUET:  * No tourniquets in log *  DICTATION: .Dragon Dictation  PLAN OF CARE: Admit to inpatient   PATIENT DISPOSITION:  PACU - hemodynamically stable.   Delay start of Pharmacological VTE agent (>24hrs) due to surgical blood loss or risk of bleeding: yes

## 2015-12-08 NOTE — Interval H&P Note (Signed)
History and Physical Interval Note:  12/08/2015 8:05 AM  [name]  has presented today for surgery, with the diagnosis of osteoarthritis left hip  The various methods of treatment have been discussed with the patient and family. After consideration of risks, benefits and other options for treatment, the patient has consented to  Procedure(s): TOTAL HIP ARTHROPLASTY (Left) as a surgical intervention .  The patient's history has been reviewed, patient examined, no change in status, stable for surgery.  I have reviewed the patient's chart and labs.  Questions were answered to the patient's satisfaction.     Arther Abbott

## 2015-12-09 LAB — CBC
HEMATOCRIT: 31.6 % — AB (ref 36.0–46.0)
HEMOGLOBIN: 10.4 g/dL — AB (ref 12.0–15.0)
MCH: 30.3 pg (ref 26.0–34.0)
MCHC: 32.9 g/dL (ref 30.0–36.0)
MCV: 92.1 fL (ref 78.0–100.0)
Platelets: 257 10*3/uL (ref 150–400)
RBC: 3.43 MIL/uL — ABNORMAL LOW (ref 3.87–5.11)
RDW: 14 % (ref 11.5–15.5)
WBC: 7.9 10*3/uL (ref 4.0–10.5)

## 2015-12-09 LAB — BASIC METABOLIC PANEL
Anion gap: 7 (ref 5–15)
BUN: 16 mg/dL (ref 6–20)
CHLORIDE: 100 mmol/L — AB (ref 101–111)
CO2: 28 mmol/L (ref 22–32)
CREATININE: 0.92 mg/dL (ref 0.44–1.00)
Calcium: 8.7 mg/dL — ABNORMAL LOW (ref 8.9–10.3)
GFR calc Af Amer: >60 mL/min (ref >60)
GFR calc non Af Amer: >60 mL/min (ref >60)
Glucose, Bld: 157 mg/dL — ABNORMAL HIGH (ref 65–99)
Potassium: 3.8 mmol/L (ref 3.5–5.1)
Sodium: 135 mmol/L (ref 135–145)

## 2015-12-09 LAB — GLUCOSE, CAPILLARY
GLUCOSE-CAPILLARY: 117 mg/dL — AB (ref 65–99)
GLUCOSE-CAPILLARY: 242 mg/dL — AB (ref 65–99)
Glucose-Capillary: 154 mg/dL — ABNORMAL HIGH (ref 65–99)
Glucose-Capillary: 135 mg/dL — ABNORMAL HIGH (ref 65–99)

## 2015-12-09 NOTE — Progress Notes (Signed)
   Subjective:    Patient ID: [name], female    DOB: 1950-10-02, 65 y.o.   MRN: [id number]  HPI s/p left tha  H/o lumbar disc protrusion on recent mri   Direct lateral approach tha     Review of Systems     Objective:   Physical Exam  Constitutional: She is oriented to person, place, and time. No distress.  Cardiovascular: Intact distal pulses.   Neurological: She is alert and oriented to person, place, and time.  Skin: She is not diaphoretic.  Psychiatric: Judgment normal.    BP 159/75 mmHg  Pulse 67  Temp(Src) 97.4 F (36.3 C) (Oral)  Resp 18  Ht 5\' 5"  (1.651 m)  Wt 262 lb (118.842 kg)  BMI 43.60 kg/m2  SpO2 99%   CBC    Component Value Date/Time   WBC 7.9 12/09/2015 0600   RBC 3.43* 12/09/2015 0600   HGB 10.4* 12/09/2015 0600   HCT 31.6* 12/09/2015 0600   PLT 257 12/09/2015 0600   MCV 92.1 12/09/2015 0600   MCH 30.3 12/09/2015 0600   MCHC 32.9 12/09/2015 0600   RDW 14.0 12/09/2015 0600   LYMPHSABS 2.7 12/03/2015 1320   MONOABS 0.6 12/03/2015 1320   EOSABS 0.1 12/03/2015 1320   BASOSABS 0.0 12/03/2015 1320   BMET    Component Value Date/Time   NA 135 12/09/2015 0600   K 3.8 12/09/2015 0600   CL 100* 12/09/2015 0600   CO2 28 12/09/2015 0600   GLUCOSE 157* 12/09/2015 0600   BUN 16 12/09/2015 0600   CREATININE 0.92 12/09/2015 0600   CALCIUM 8.7* 12/09/2015 0600   GFRNONAA >60 12/09/2015 0600   GFRAA >60 12/09/2015 0600         Assessment & Plan:   Stable post op left tha   Start rehab today   Pain is well controlled at the moment  She needs to go to Rehab (no help at home )

## 2015-12-09 NOTE — [place] FL2 (Signed)
[place] MEDICAID FL2 LEVEL OF CARE SCREENING TOOL     IDENTIFICATION  Patient Name: [name] Birthdate: [birth year] Sex: female Admission Date (Current Location): 12/08/2015  [facility] and [place] Number:  [employer] and Address:  [place] [address], [place]      Provider Number: [id number]  Attending Physician Name and Address:  Carole Civil, MD  Relative Name and Phone Number:       Current Level of Care: Hospital Recommended Level of Care: [place] Prior Approval Number:    Date Approved/Denied:   PASRR Number:  [id number] Discharge Plan: SNF    Current Diagnoses: Patient Active Problem List   Diagnosis Date Noted  . Arthritis of left hip 12/08/2015  . Osteoarthritis of left hip   . Encounter for screening colonoscopy 04/07/2014  . Dysphagia 04/02/2014  . Unspecified constipation 04/02/2014  . GERD (gastroesophageal reflux disease) 04/02/2013  . Chronic bronchitis with acute exacerbation ([place]) 04/02/2013  . Atypical chest pain 04/02/2013  . History of ovarian cancer 04/02/2013  . Tobacco abuse 04/02/2013  . Spinal stenosis, lumbar region, with neurogenic claudication 02/27/2013  . Iliotibial band tendonitis 02/27/2013  . DEGENERATIVE DISC DISEASE, LUMBOSACRAL SPINE 12/09/2009  . H N P-LUMBAR 09/29/2009  . SPINAL STENOSIS 09/29/2009  . KNEE, ARTHRITIS, DEGEN./OSTEO 09/23/2008  . KNEE PAIN 09/23/2008  . TOTAL KNEE FOLLOW-UP 09/23/2008  . LOW BACK PAIN 05/14/2008  . SCIATICA 05/14/2008  . DIABETES 05/12/2008  . HIGH BLOOD PRESSURE 05/12/2008    Orientation RESPIRATION BLADDER Height & Weight     Self, Time, Situation, Place  Normal Continent Weight: 262 lb (118.842 kg) Height:  5\' 5"  (165.1 cm)  BEHAVIORAL SYMPTOMS/MOOD NEUROLOGICAL BOWEL NUTRITION STATUS  Other (Comment) (no behaviors)    Stable  Continent Diet (regular)  AMBULATORY STATUS COMMUNICATION OF NEEDS  Skin   Extensive Assist Verbally Surgical wounds                       Personal Care Assistance Level of Assistance  Bathing, Feeding, Dressing Bathing Assistance: Limited assistance Feeding assistance: Independent Dressing Assistance: Limited assistance     Functional Limitations Info  Sight, Hearing, Speech Sight Info: Adequate Hearing Info: Adequate Speech Info: Adequate    SPECIAL CARE FACTORS FREQUENCY  PT (By licensed PT), OT (By licensed OT)     PT Frequency: 5 OT Frequency: 5            Contractures Contractures Info: Not present    Additional Factors Info  Code Status, Allergies, Psychotropic, Insulin Sliding Scale, Isolation Precautions Code Status Info: Full Code Allergies Info: Gabapentin Psychotropic Info: none Insulin Sliding Scale Info: 15 units (3 x a day with meals) Isolation Precautions Info: none     Current Medications (12/09/2015):  This is the current hospital active medication list Current Facility-Administered Medications  Medication Dose Route Frequency Provider Last Rate Last Dose  . albuterol (PROVENTIL) (2.5 MG/3ML) 0.083% nebulizer solution 2.5 mg  2.5 mg Nebulization Q4H PRN Carole Civil, MD      . alum & mag hydroxide-simeth (MAALOX/MYLANTA) 200-200-20 MG/5ML suspension 30 mL  30 mL Oral Q4H PRN Carole Civil, MD      . aspirin EC tablet 325 mg  325 mg Oral BID Carole Civil, MD   325 mg at 12/09/15 1001  . atenolol (TENORMIN) tablet 50 mg  50 mg Oral Daily Carole Civil, MD   50 mg  at 12/09/15 1001  . bisacodyl (DULCOLAX) EC tablet 5 mg  5 mg Oral Daily PRN Carole Civil, MD      . calcium carbonate (OS-CAL - dosed in mg of elemental calcium) tablet 500 mg of elemental calcium  1 tablet Oral Daily Carole Civil, MD   500 mg of elemental calcium at 12/09/15 1001  . cholecalciferol (VITAMIN D) tablet 1,000 Units  1,000 Units Oral Daily Carole Civil, MD   1,000 Units at 12/09/15 1001  .  diphenhydrAMINE (BENADRYL) 12.5 MG/5ML elixir 12.5-25 mg  12.5-25 mg Oral Q4H PRN Carole Civil, MD      . docusate sodium (COLACE) capsule 100 mg  100 mg Oral BID Carole Civil, MD   100 mg at 12/09/15 1001  . HYDROcodone-acetaminophen (NORCO) 7.5-325 MG per tablet 1 tablet  1 tablet Oral 4 times per day Carole Civil, MD   1 tablet at 12/09/15 0525  . HYDROmorphone (DILAUDID) injection 0.5 mg  0.5 mg Intravenous Q2H PRN Carole Civil, MD   0.5 mg at 12/09/15 0846  . insulin aspart (novoLOG) injection 15 Units  15 Units Subcutaneous TID WC Carole Civil, MD   15 Units at 12/09/15 0830  . insulin NPH Human (HUMULIN N,NOVOLIN N) injection 30 Units  30 Units Subcutaneous QAC breakfast Carole Civil, MD   30 Units at 12/09/15 0830  . lisinopril (PRINIVIL,ZESTRIL) tablet 10 mg  10 mg Oral Daily Carole Civil, MD   10 mg at 12/09/15 1001  . magnesium citrate solution 1 Bottle  1 Bottle Oral Once PRN Carole Civil, MD      . menthol-cetylpyridinium (CEPACOL) lozenge 3 mg  1 lozenge Oral PRN Carole Civil, MD       Or  . phenol (CHLORASEPTIC) mouth spray 1 spray  1 spray Mouth/Throat PRN Carole Civil, MD      . methocarbamol (ROBAXIN) 500 mg in dextrose 5 % 50 mL IVPB  500 mg Intravenous Once Carole Civil, MD   500 mg at 12/08/15 1457  . methocarbamol (ROBAXIN) tablet 500 mg  500 mg Oral Q6H PRN Carole Civil, MD       Or  . methocarbamol (ROBAXIN) 500 mg in dextrose 5 % 50 mL IVPB  500 mg Intravenous Q6H PRN Carole Civil, MD   500 mg at 12/08/15 1242  . metoCLOPramide (REGLAN) tablet 5-10 mg  5-10 mg Oral Q8H PRN Carole Civil, MD       Or  . metoCLOPramide (REGLAN) injection 5-10 mg  5-10 mg Intravenous Q8H PRN Carole Civil, MD      . ondansetron [facility]) injection 4 mg  4 mg Intravenous Once Carole Civil, MD   4 mg at 12/08/15 1455  . ondansetron (ZOFRAN) tablet 4 mg  4 mg Oral Q6H PRN Carole Civil, MD       Or   . ondansetron [facility]) injection 4 mg  4 mg Intravenous Q6H PRN Carole Civil, MD      . oxyCODONE (Oxy IR/ROXICODONE) immediate release tablet 5-10 mg  5-10 mg Oral Q3H PRN Carole Civil, MD   5 mg at 12/08/15 2009  . polyethylene glycol (MIRALAX / GLYCOLAX) packet 17 g  17 g Oral Daily Carole Civil, MD   17 g at 12/09/15 1000  . triamterene-hydrochlorothiazide (MAXZIDE-25) 37.5-25 MG per tablet 1 tablet  1 tablet Oral Daily Carole Civil, MD   1  tablet at 12/09/15 1001  . verapamil (CALAN-SR) CR tablet 240 mg  240 mg Oral q morning - 10a Carole Civil, MD   240 mg at 12/09/15 1001     Discharge Medications: Please see discharge summary for a list of discharge medications.  Relevant Imaging Results:  Relevant Lab Results:   Additional Information    Lilly Cove, LCSW

## 2015-12-09 NOTE — Progress Notes (Signed)
OT Screen Note  Patient Details Name: LASHENA INCLAN MRN: [id number] DOB: 09-Jun-1951   Cancelled Treatment:    Reason Eval/Treat Not Completed: OT screened, no needs identified, will sign off. Spoke with patient regarding DME and AE that may be beneficial once she returns home. [employer] fast streamer, BSC, and Long Acupuncturist. Pt verbalized understanding. UB strengthening assessed. LUE is 5/5 and functional. RUE shoulder flexion and abduction weaker measuring at 3+/5 (flexion) and 4/5 (abduction). Pt reports possibly straining her right arm 3 months ago when throwing a garbage bag out to the trash. Patient would benefit from OT services at SNF for UB strengthening and ADL re-training in preparation to return home.    Ailene Ravel, OTR/L,CBIS  [phone]  12/09/2015, 9:54 AM

## 2015-12-09 NOTE — Clinical Social Work Note (Signed)
Clinical Social Work Assessment  Patient Details  Name: [name] MRN: [id number] Date of Birth: Sep 05, 1951  Date of referral:  12/09/15               Reason for consult:  Discharge Planning, Facility Placement                Permission sought to share information with:  Case Manager, Customer service manager, Family Supports Permission granted to share information::  Yes, Verbal Permission Granted  Name::        Agency::  [facility]  Relationship::  Son (but patient will do all communication per report)   Contact Information:     Housing/Transportation Living arrangements for the past 2 months:  Single Family Home Source of Information:  Patient, Medical Team Patient Interpreter Needed:  None Criminal Activity/Legal Involvement Pertinent to Current Situation/Hospitalization:  No - Comment as needed Significant Relationships:  Adult Children, Other Family Members, Community Support Lives with:    Do you feel safe going back to the place where you live?  No (patient lives alone, wanting additional assistance at DC) Need for family participation in patient care:  No (Coment) (patient able to make all decisions)  Care giving concerns:  Patient reports she lives alone in home right outside of [place], [place].  Patient has large family with 2 children and 6 grandchildren, but feels guilty bothering family to take care of self. SInce living alone and limited by surgery, she is agreeable and willing to go to [facility] in [place] area.   Social Worker assessment / plan:  LCSW received consult, consulted with PT and medical team. Patient agreeable to [facility]. This will be her first admission fot ST rehab.  LCSW explained role, plan for SNF and insurance process. She was very motivated to begin therapy, reported some pain, but overall doing well after surgery. LCSW completed passar, FL2 and sent clincials for review. WIll follow up with bed offers, but pt hopeful for [facility].  Plan:  [facility] SNF at DC  Employment status:  Retired Nurse, adult PT Recommendations:  [place] / Referral to community resources:  [place]  Patient/Family's Response to care:  Agreeable to plan  Patient/Family's Understanding of and Emotional Response to Diagnosis, Current Treatment, and Prognosis:  Patient processes her excitement to be pain free once she has recovered from surgery. She reports she can actually sit in a chair even after surgery and no be hurting.  She is understanding and reasonable about her disposition and treatment plan.  Emotional Assessment Appearance:  Appears stated age Attitude/Demeanor/Rapport:  Other (cooperative, engaging and pleasant) Affect (typically observed):  Accepting, Adaptable, Hopeful Orientation:  Oriented to Self, Oriented to Place, Oriented to  Time, Oriented to Situation Alcohol / Substance use:  Not Applicable Psych involvement (Current and /or in the community):  No (Comment)  Discharge Needs  Concerns to be addressed:  No discharge needs identified Readmission within the last 30 days:  No Current discharge risk:  None Barriers to Discharge:  No Barriers Identified, Continued Medical Work up   Lilly Cove, LCSW 12/09/2015, 10:39 AM

## 2015-12-09 NOTE — Evaluation (Signed)
Physical Therapy Evaluation Patient Details Name: [name] MRN: [id number] DOB: 1951-02-03 Today's Date: 12/09/2015   History of Present Illness  HPI: [name], 65 y.o. female, has a history of pain and functional disability in the left hip(s) due to arthritis and patient has failed non-surgical conservative treatments for greater than 12 weeks to include NSAID's and/or analgesics, corticosteriod injections, flexibility and strengthening excercises, use of assistive devices and activity modification. Onset of symptoms was gradual starting 2 years ago with gradually worsening course since that time.The patient noted no past surgery on the left hip(s). Patient currently rates pain in the left hip at 8 out of 10 with activity. Patient has night pain, worsening of pain with activity and weight bearing, trendelenberg gait, pain that interfers with activities of daily living, pain with passive range of motion and crepitus. Patient has evidence of subchondral sclerosis and joint space narrowing by imaging studies. This condition presents safety issues increasing the risk of falls.This patient has had Intra-articular cortisone injection and epidural injection. There is no current active infection.  Clinical Impression   Pt was seen for initial eval/tx.  She is a very pleasant obese female who has no pain at rest, 8/10 pain with movement.  She was medicated for pain during initial phase of tx.  Pt lives alone and had been independent with a walker or cane prior to surgery.  She was instructed in direct lateral approach hip precautions.  Therapeutic exercise per THR protocol was initiated and tolerated well.  She required mod assist to transfer to EOB.  She was instructed in stand pivot transfer needing mod assist.  Pt was instructed in gait with walker, WBAT L but was unable to take any steps due to obesity and difficulty tolerating weight on the LLE.  She will definitely benefit from SNF at d/c  and this is pt's preference.    Follow Up Recommendations SNF    Equipment Recommendations  None recommended by PT    Recommendations for Other Services OT consult     Precautions / Restrictions Precautions Precautions: Fall Precaution Booklet Issued: Yes (comment) Precaution Comments: direct lateral approach precautions Restrictions Weight Bearing Restrictions: No Other Position/Activity Restrictions: WBAT L      Mobility  Bed Mobility Overal bed mobility: Needs Assistance Bed Mobility: Supine to Sit     Supine to sit: Mod assist        Transfers Overall transfer level: Needs assistance Equipment used: Rolling walker (2 wheeled) Transfers: Sit to/from [facility] Sit to Stand: Min assist Stand pivot transfers: Mod assist          Ambulation/Gait             General Gait Details: unable to take any actual steps with a walker due to obesity/difficulty tolerating weight bearing on LLE  Stairs            Wheelchair Mobility    Modified Rankin (Stroke Patients Only)       Balance Overall balance assessment: No apparent balance deficits (not formally assessed)                                           Pertinent Vitals/Pain Pain Assessment: 0-10 Pain Score: 8  Pain Location: left hip Pain Descriptors / Indicators: Aching Pain Intervention(s): Limited activity within patient's tolerance;Monitored during session;Repositioned;RN gave pain meds during session;Ice applied  Home Living Family/patient expects to be discharged to:: Skilled nursing facility Living Arrangements: Alone                    Prior Function Level of Independence: Independent with assistive device(s)         Comments: ambulates with cane or walker     Hand Dominance        Extremity/Trunk Assessment               Lower Extremity Assessment: LLE deficits/detail   LLE Deficits / Details: significant weakness  of hip and knee due to surgery yesterday     Communication   Communication: No difficulties  Cognition Arousal/Alertness: Awake/alert Behavior During Therapy: WFL for tasks assessed/performed Overall Cognitive Status: Within Functional Limits for tasks assessed                      General Comments      Exercises Total Joint Exercises Ankle Circles/Pumps: AROM;Both;10 reps;Supine Quad Sets: AROM;Both;10 reps;Supine Gluteal Sets: AROM;Both;10 reps;Supine Short Arc Quad: AAROM;Left;10 reps;Supine Heel Slides: AAROM;Left;10 reps;Supine      Assessment/Plan    PT Assessment Patient needs continued PT services  PT Diagnosis Difficulty walking;Acute pain   PT Problem List Decreased strength;Decreased range of motion;Decreased activity tolerance;Decreased mobility;Decreased knowledge of precautions;Obesity;Pain  PT Treatment Interventions Gait training;Functional mobility training;Therapeutic exercise;Patient/family education   PT Goals (Current goals can be found in the Care Plan section) Acute Rehab PT Goals Patient Stated Goal: return to independence at home, pain free PT Goal Formulation: With patient Time For Goal Achievement: 12/23/15 Potential to Achieve Goals: Good    Frequency BID   Barriers to discharge Decreased caregiver support lives alone    Co-evaluation               End of Session Equipment Utilized During Treatment: Gait belt Activity Tolerance: Patient tolerated treatment well;No increased pain Patient left: in chair;with call bell/phone within reach Nurse Communication: Mobility status         Time: [id number] PT Time Calculation (min) (ACUTE ONLY): 63 min   Charges:   PT Evaluation $PT Eval Moderate Complexity: 1 Procedure PT Treatments $Therapeutic Exercise: 8-22 mins $Therapeutic Activity: 8-22 mins   PT G CodesDemetrios Isaacs L  PT 12/09/2015, 9:49 AM

## 2015-12-09 NOTE — Progress Notes (Signed)
Physical Therapy Treatment Patient Details Name: [name] MRN: [id number] DOB: 1951-04-18 Today's Date: 12/09/2015    History of Present Illness HPI: [name], 65 y.o. female, has a history of pain and functional disability in the left hip(s) due to arthritis and patient has failed non-surgical conservative treatments for greater than 12 weeks to include NSAID's and/or analgesics, corticosteriod injections, flexibility and strengthening excercises, use of assistive devices and activity modification. Onset of symptoms was gradual starting 2 years ago with gradually worsening course since that time.The patient noted no past surgery on the left hip(s). Patient currently rates pain in the left hip at 8 out of 10 with activity. Patient has night pain, worsening of pain with activity and weight bearing, trendelenberg gait, pain that interfers with activities of daily living, pain with passive range of motion and crepitus. Patient has evidence of subchondral sclerosis and joint space narrowing by imaging studies. This condition presents safety issues increasing the risk of falls.This patient has had Intra-articular cortisone injection and epidural injection. There is no current active infection.    PT Comments    Pt tolerated sitting in recliner x 2 hours with no problem.  Pain in the left hip is well controlled.  She was instructed in standing pivot transfer using a standard walker and able to perform with only min assist.  She was instructed in gait with the walker and was able to traverse about 6' with the walker.  Gait is very labored and she is unable to clear the left foot from the floor.  She should progress rapidly and will be an excellent rehab candidate as she is highly motivated.  Follow Up Recommendations  SNF     Equipment Recommendations  None recommended by PT    Recommendations for Other Services OT consult     Precautions / Restrictions Precautions Precautions:  Fall Precaution Booklet Issued: Yes (comment) Precaution Comments: direct lateral approach precautions Restrictions Weight Bearing Restrictions: No Other Position/Activity Restrictions: WBAT L    Mobility  Bed Mobility Overal bed mobility: Needs Assistance Bed Mobility: Sit to Supine     Supine to sit: Mod assist Sit to supine: Mod assist   General bed mobility comments: assist needed to transfer LLE into the bed...pt instructed in bridging with the RLE for weight shift of buttocks in the bed  Transfers Overall transfer level: Needs assistance Equipment used: Rolling walker (2 wheeled) Transfers: Sit to/from Stand Sit to Stand: Min assist Stand pivot transfers: Min assist       General transfer comment: instructed in use of the walker during pivot transfer and pt had a much easier time with BSC transfer  Ambulation/Gait Ambulation/Gait assistance: Min guard Ambulation Distance (Feet): 6 Feet Assistive device: Standard walker Gait Pattern/deviations: Decreased stance time - left;Decreased step length - left;Decreased dorsiflexion - left;Decreased weight shift to left   Gait velocity interpretation: <1.8 ft/sec, indicative of risk for recurrent falls General Gait Details: pt now able to take a few steps with walker but unable to clear the floor with the LLE   Stairs            Wheelchair Mobility    Modified Rankin (Stroke Patients Only)       Balance Overall balance assessment: No apparent balance deficits (not formally assessed)                                  Cognition Arousal/Alertness:  Awake/alert Behavior During Therapy: WFL for tasks assessed/performed Overall Cognitive Status: Within Functional Limits for tasks assessed                      Exercises Total Joint Exercises Ankle Circles/Pumps: AROM;Both;10 reps;Supine Quad Sets: AROM;Both;10 reps;Supine Gluteal Sets: AROM;Both;10 reps;Supine Short Arc Quad: AAROM;Left;10  reps;Supine Heel Slides: AAROM;Left;10 reps;Supine    General Comments        Pertinent Vitals/Pain Pain Assessment: No/denies pain Pain Score: 8  Pain Location: left hip Pain Descriptors / Indicators: Aching Pain Intervention(s): Limited activity within patient's tolerance;Monitored during session;Repositioned;RN gave pain meds during session;Ice applied    Home Living Family/patient expects to be discharged to:: Skilled nursing facility Living Arrangements: Alone                  Prior Function Level of Independence: Independent with assistive device(s)      Comments: ambulates with cane or walker   PT Goals (current goals can now be found in the care plan section) Acute Rehab PT Goals Patient Stated Goal: return to independence at home, pain free PT Goal Formulation: With patient Time For Goal Achievement: 12/23/15 Potential to Achieve Goals: Good Progress towards PT goals: Progressing toward goals    Frequency  BID    PT Plan Current plan remains appropriate    Co-evaluation             End of Session Equipment Utilized During Treatment: Gait belt Activity Tolerance: Patient tolerated treatment well;No increased pain Patient left: in bed;with call bell/phone within reach;with nursing/sitter in room     Time: 1120-1154 PT Time Calculation (min) (ACUTE ONLY): 34 min  Charges:  $Gait Training: 8-22 mins $Therapeutic Exercise: 8-22 mins $Therapeutic Activity: 8-22 mins                    G Codes:      [name] L  PT 12/09/2015, 12:13 PM

## 2015-12-09 NOTE — Consult Note (Signed)
   [facility] Inpatient Consult   12/09/2015  [name] [birth year] [id number]  Patient screened for potential [place] Management services. Patient is eligible for [place]. Epic reveals patient's discharge plan is skilled nursing for short term rehab, there were no identifiable [facility] care management needs at this time. [facility] Care Management services not appropriate at this time. If patient's post hospital needs change please place a [facility] Care Management consult.  For questions please contact:   Royetta Crochet. Laymond Purser, RN, BSN, [facility] Liaison [phone]

## 2015-12-09 NOTE — Clinical Social Work Placement (Addendum)
   CLINICAL SOCIAL WORK PLACEMENT  NOTE  Date:  12/09/2015  Patient Details  Name: [name] MRN: [id number] Date of Birth: [birth year]  Clinical Social Work is seeking post-discharge placement for this patient at the [place] level of care (*CSW will initial, date and re-position this form in  chart as items are completed):  Yes   Patient/family provided with [place] Work Department's list of facilities offering this level of care within the geographic area requested by the patient (or if unable, by the patient's family).  Yes   Patient/family informed of their freedom to choose among providers that offer the needed level of care, that participate in Medicare, Medicaid or managed care program needed by the patient, have an available bed and are willing to accept the patient.  Yes   Patient/family informed of [place]'s ownership interest in [facility] and [facility], as well as of the fact that they are under no obligation to receive care at these facilities.  PASRR submitted to EDS on 12/09/15     PASRR number received on     12/09/15  Existing PASRR number confirmed on       FL2 transmitted to all facilities in geographic area requested by pt/family on 12/09/15     FL2 transmitted to all facilities within larger geographic area on       Patient informed that his/her managed care company has contracts with or will negotiate with certain facilities, including the following:            Patient/family informed of bed offers received.  yes  Patient chooses bed at     [facility] Physician recommends and patient chooses bed at     SNF Patient to be transferred to   on  . 3/25  Patient to be transferred to facility by     [facility] staff  Patient family notified on   of transfer.  None patient to call and alert  Name of family member notified:      NA  PHYSICIAN Please sign FL2     Additional Comment:     _______________________________________________ Lilly Cove, LCSW 12/09/2015, 10:42 AM

## 2015-12-09 NOTE — Progress Notes (Signed)
Patient has been accepted by [facility]. Patient has a bed ready at DC to transition to facility.  Patient can DC over weekend if needing additional medical care in hospital.   Patient agreeable to plan. LCSW to facilitate DC once medically stable.  Lane Hacker, MSW Clinical Social Work: Emergency Room [phone]

## 2015-12-10 ENCOUNTER — Encounter (HOSPITAL_COMMUNITY): Payer: Self-pay | Admitting: Orthopedic Surgery

## 2015-12-10 LAB — TYPE AND SCREEN
ABO/RH(D): AB POS
ANTIBODY SCREEN: NEGATIVE
UNIT DIVISION: 0
Unit division: 0

## 2015-12-10 LAB — GLUCOSE, CAPILLARY
GLUCOSE-CAPILLARY: 192 mg/dL — AB (ref 65–99)
Glucose-Capillary: 277 mg/dL — ABNORMAL HIGH (ref 65–99)
Glucose-Capillary: 143 mg/dL — ABNORMAL HIGH (ref 65–99)

## 2015-12-10 LAB — CBC
HCT: 32.2 % — ABNORMAL LOW (ref 36.0–46.0)
HEMOGLOBIN: 10.9 g/dL — AB (ref 12.0–15.0)
MCH: 30.7 pg (ref 26.0–34.0)
MCHC: 33.9 g/dL (ref 30.0–36.0)
MCV: 90.7 fL (ref 78.0–100.0)
PLATELETS: 285 10*3/uL (ref 150–400)
RBC: 3.55 MIL/uL — ABNORMAL LOW (ref 3.87–5.11)
RDW: 13.8 % (ref 11.5–15.5)
WBC: 11.5 10*3/uL — ABNORMAL HIGH (ref 4.0–10.5)

## 2015-12-10 MED ORDER — HYDROMORPHONE HCL 1 MG/ML IJ SOLN
0.5000 mg | INTRAMUSCULAR | Status: DC | PRN
  Administered 2015-12-10: 0.5 mg via INTRAVENOUS
  Filled 2015-12-10: qty 1

## 2015-12-10 NOTE — Progress Notes (Signed)
Subjective: 2 Days Post-Op Procedure(s) (LRB): TOTAL HIP ARTHROPLASTY (Left) Patient reports pain as mild to moderate .    Objective: Vital signs in last 24 hours: Temp:  [98.1 F (36.7 C)-98.6 F (37 C)] 98.6 F (37 C) (03/24 0000) Pulse Rate:  [67-70] 67 (03/24 0000) Resp:  [20] 20 (03/24 0000) BP: (168-169)/(69-86) 169/69 mmHg (03/24 0000) SpO2:  [93 %-97 %] 93 % (03/24 0000)  Intake/Output from previous day: 03/23 0701 - 03/24 0700 In: -  Out: 2000 [Urine:2000] Intake/Output this shift:     Recent Labs  12/09/15 0600 12/10/15 0548  HGB 10.4* 10.9*    Recent Labs  12/09/15 0600 12/10/15 0548  WBC 7.9 11.5*  RBC 3.43* 3.55*  HCT 31.6* 32.2*  PLT 257 285    Recent Labs  12/09/15 0600  NA 135  K 3.8  CL 100*  CO2 28  BUN 16  CREATININE 0.92  GLUCOSE 157*  CALCIUM 8.7*   No results for input(s): LABPT, INR in the last 72 hours.  Neurologically intact Neurovascular intact Sensation intact distally Intact pulses distally Dorsiflexion/Plantar flexion intact Compartment soft  Assessment/Plan: 2 Days Post-Op Procedure(s) (LRB): TOTAL HIP ARTHROPLASTY (Left) Plan for discharge tomorrow Discharge to SNF  [facility] 12/10/2015, 7:51 AM

## 2015-12-10 NOTE — Progress Notes (Signed)
Physical Therapy Treatment Patient Details Name: [name] MRN: [id number] DOB: [birth year] Today's Date: 12/10/2015    History of Present Illness HPI: [name], 65 y.o. female, has a history of pain and functional disability in the left hip(s) due to arthritis and patient has failed non-surgical conservative treatments for greater than 12 weeks to include NSAID's and/or analgesics, corticosteriod injections, flexibility and strengthening excercises, use of assistive devices and activity modification. Onset of symptoms was gradual starting 2 years ago with gradually worsening course since that time.The patient noted no past surgery on the left hip(s). Patient currently rates pain in the left hip at 8 out of 10 with activity. Patient has night pain, worsening of pain with activity and weight bearing, trendelenberg gait, pain that interfers with activities of daily living, pain with passive range of motion and crepitus. Patient has evidence of subchondral sclerosis and joint space narrowing by imaging studies. This condition presents safety issues increasing the risk of falls.This patient has had Intra-articular cortisone injection and epidural injection. There is no current active infection.    PT Comments    Pt reports improved AROM with minimally less pain compared to yesterday, but remains very limited due to 8/10 pain with all movement. Pt is reluctant to ask for additional pain meds, but I encouraged her to reconsider as her pain was clearly the biggest limiting factor. Joint stiffness loosens up quickly with AAROM. VSS on room air. Strength with select exercises seems very good, but again, limited by pain inhibition. Pt demonstrating impairment of strength, balance, and activity tolerance, limiting indep in ADL and functional mobility within the home and community. Pt will benefit from skilled PT intervention to address the above deficits in order to restore patient to PLOF and  improve safety for return to home.     Follow Up Recommendations  SNF     Equipment Recommendations  None recommended by PT    Recommendations for Other Services       Precautions / Restrictions Precautions Precautions: Fall Precaution Booklet Issued: Yes (comment) Precaution Comments: direct lateral approach precautions Restrictions Weight Bearing Restrictions: Yes LLE Weight Bearing: Weight bearing as tolerated    Mobility  Bed Mobility Overal bed mobility: Needs Assistance Bed Mobility: Sit to Supine;Supine to Sit     Supine to sit: Mod assist Sit to supine: Min assist   General bed mobility comments: Assistance with LLE to maintain surgical precautions  Transfers Overall transfer level: Needs assistance Equipment used: Standard walker Transfers: Sit to/from Stand Sit to Stand: From elevated surface;Supervision         General transfer comment: Performed 3x in session, followed by upright normal stance balance s UE support, 3x30sec  Ambulation/Gait             General Gait Details: Gait attempted, but very limited AROM in LLE in swing phase, with substantial pain increase. I encouraged pt to consider taking advantage of available analgesics prior to next session to improve tolerance ot functional mobility.    Stairs            Wheelchair Mobility    Modified Rankin (Stroke Patients Only)       Balance Overall balance assessment: No apparent balance deficits (not formally assessed)                                  Cognition Arousal/Alertness: Awake/alert Behavior During Therapy: WFL for tasks assessed/performed  Overall Cognitive Status: Within Functional Limits for tasks assessed                      Exercises Total Joint Exercises Ankle Circles/Pumps: AROM;Supine;20 reps;Left Quad Sets: AROM;Supine;Left;10 reps Short Arc QuadSinclair Ship;Left;Supine;10 reps (MinA required due to pain ) Heel Slides: AAROM;Left;10  reps;Supine (Mod-max assist due to pain) Long Arc Quad: AAROM;Left;10 reps;Supine (supervision. )    General Comments        Pertinent Vitals/Pain Pain Assessment: 0-10 Pain Score: 8  Pain Location: Left hip, 0/10 at rest, 8/10 peak with activity Pain Descriptors / Indicators: Aching;Operative site guarding Pain Intervention(s): Limited activity within patient's tolerance;Monitored during session    Home Living                      Prior Function            PT Goals (current goals can now be found in the care plan section) Acute Rehab PT Goals Patient Stated Goal: return to independence at home, pain free PT Goal Formulation: With patient Time For Goal Achievement: 12/23/15 Potential to Achieve Goals: Good Progress towards PT goals: PT to reassess next treatment    Frequency  BID    PT Plan Current plan remains appropriate    Co-evaluation             End of Session Equipment Utilized During Treatment: Gait belt Activity Tolerance: Patient tolerated treatment well;Patient limited by pain Patient left: in bed;with family/visitor present;with bed alarm set;with call bell/phone within reach (pt report already having been up to chair for a few hours this morning. )     Time: [id number] PT Time Calculation (min) (ACUTE ONLY): 27 min  Charges:  $Therapeutic Exercise: 8-22 mins $Therapeutic Activity: 8-22 mins                    G Codes:      12:11 PM, 01/05/2016 Etta Grandchild, PT, DPT PRN Physical Therapist at [place] License # [id number] (wireless)  [phone] (mobile)

## 2015-12-10 NOTE — Care Management Important Message (Signed)
Important Message  Patient Details  Name: [name] MRN: [id number] Date of Birth: September 30, 1950   Medicare Important Message Given:  Yes    Sherald Barge, RN 12/10/2015, 8:31 AM

## 2015-12-10 NOTE — Care Management Note (Signed)
Case Management Note  Patient Details  Name: ALANIZ SKIDMORE MRN: [id number] Date of Birth: 10-03-1950  Subjective/Objective:                  Pt is from home, s/p hip replacement. PT has seen pt and recommends SNF. Pt is agreeable.   Action/Plan: CSW is aware of DC plan and has arranged for placement at DC. No CM needs identified. DC anticipated on 12/11/2015  Expected Discharge Date:    12/11/2015              Expected Discharge Plan:  [place]  In-House Referral:  Clinical Social Work  Discharge planning Services  CM Consult  Post Acute Care Choice:  NA Choice offered to:  NA  DME Arranged:    DME Agency:     HH Arranged:    [place] Agency:     Status of Service:  Completed, signed off  Medicare Important Message Given:  Yes Date Medicare IM Given:    Medicare IM give by:    Date Additional Medicare IM Given:    Additional Medicare Important Message give by:     If discussed at [place] of Stay Meetings, dates discussed:    Additional Comments:  Sherald Barge, RN 12/10/2015, 8:32 AM

## 2015-12-10 NOTE — Progress Notes (Signed)
Physical Therapy Treatment Patient Details Name: [name] MRN: [id number] DOB: 1950-10-10 Today's Date: 12/10/2015    History of Present Illness HPI: [name], 65 y.o. female, has a history of pain and functional disability in the left hip(s) due to arthritis and patient has failed non-surgical conservative treatments for greater than 12 weeks to include NSAID's and/or analgesics, corticosteriod injections, flexibility and strengthening excercises, use of assistive devices and activity modification. Onset of symptoms was gradual starting 2 years ago with gradually worsening course since that time.The patient noted no past surgery on the left hip(s). Patient currently rates pain in the left hip at 8 out of 10 with activity. Patient has night pain, worsening of pain with activity and weight bearing, trendelenberg gait, pain that interfers with activities of daily living, pain with passive range of motion and crepitus. Patient has evidence of subchondral sclerosis and joint space narrowing by imaging studies. This condition presents safety issues increasing the risk of falls.This patient has had Intra-articular cortisone injection and epidural injection. There is no current active infection.    PT Comments    Coordinated with RN prior to session for any additional available analgesia, which seems to have helped somewhat. Pt received in chair. Pt demonstrates improved pain response and facility in functional mobility with mildly decreased pain inhibition. Pt able to demonstrate improved hip flexion AROM during gait cycle this session and progress gait to 24 feet, alternating forward and retro ambulation as cued. Balance remains modI. Reviewed LAQ seated this session and asked pt to perform 3x daily for 15 sets while seated. Pt is agreeable, and reports she has been working on her ankle pumps while seated. Anticipated DC to STR tomorrow, which is appropriate from the PT perspective, hence  she will not need to be seen in the morning.   Follow Up Recommendations  SNF     Equipment Recommendations  None recommended by PT    Recommendations for Other Services OT consult     Precautions / Restrictions Precautions Precautions: Fall Precaution Booklet Issued: Yes (comment) Precaution Comments: direct lateral approach precautions Restrictions Weight Bearing Restrictions: Yes LLE Weight Bearing: Weight bearing as tolerated    Mobility  Bed Mobility Overal bed mobility:  (received up in bed. ) Bed Mobility: Sit to Supine;Supine to Sit     Supine to sit: Mod assist Sit to supine: Min assist   General bed mobility comments: Assistance with LLE to maintain surgical precautions  Transfers Overall transfer level: Needs assistance Equipment used: Standard walker Transfers: Sit to/from Stand Sit to Stand: Supervision         General transfer comment: Performed 3x in session, followed by upright normal stance balance s UE support, 3x30sec  Ambulation/Gait Ambulation/Gait assistance: Min guard Ambulation Distance (Feet): 24 Feet Assistive device: Standard walker   Gait velocity: .76m/s Gait velocity interpretation: <1.8 ft/sec, indicative of risk for recurrent falls General Gait Details: three point gait, with standard walker, encouraged normal hip flexion + knee flexion during gait gyle (swing phase) which patient is reluctnat, but able to perform with increased pain. Pt alterantes AMB and retroAMB as cued q 50ft.    Stairs            Wheelchair Mobility    Modified Rankin (Stroke Patients Only)       Balance Overall balance assessment: No apparent balance deficits (not formally assessed);Modified Independent  Cognition Arousal/Alertness: Awake/alert Behavior During Therapy: WFL for tasks assessed/performed Overall Cognitive Status: Within Functional Limits for tasks assessed                       Exercises Total Joint Exercises Ankle Circles/Pumps: AROM;Supine;20 reps;Left Quad Sets: AROM;Supine;Left;10 reps Short Arc QuadSinclair Ship;Left;Supine;10 reps (MinA required due to pain ) Heel Slides: AAROM;Left;10 reps;Supine (Mod-max assist due to pain) Long Arc Quad: AAROM;Left;Supine;15 reps    General Comments        Pertinent Vitals/Pain Pain Assessment: 0-10 Pain Score: 7  Pain Location: Left hip, anterior and medial.  Pain Descriptors / Indicators: Operative site guarding;Aching Pain Intervention(s): Limited activity within patient's tolerance;Monitored during session;Premedicated before session    Home Living                      Prior Function            PT Goals (current goals can now be found in the care plan section) Acute Rehab PT Goals Patient Stated Goal: return to independence at home, pain free PT Goal Formulation: With patient Time For Goal Achievement: 12/23/15 Potential to Achieve Goals: Good Progress towards PT goals: Progressing toward goals    Frequency  BID    PT Plan Current plan remains appropriate    Co-evaluation             End of Session Equipment Utilized During Treatment: Gait belt Activity Tolerance: Patient tolerated treatment well;Patient limited by fatigue;Patient limited by pain Patient left: with family/visitor present;with call bell/phone within reach;in chair     Time: [id number] PT Time Calculation (min) (ACUTE ONLY): 17 min  Charges:  $Gait Training: 8-22 mins $Therapeutic Exercise: 8-22 mins $Therapeutic Activity: 8-22 mins                    G Codes:      3:37 PM, Jan 07, 2016 Etta Grandchild, PT, DPT PRN Physical Therapist at [place] License # [id number] (wireless)  [phone] (mobile)

## 2015-12-10 NOTE — Progress Notes (Signed)
Patient will DC on 3/25 to [facility]. Patient agreeable to plan. Facility alerted of weekend discharge and agreeable.  Patient will transport by hospital staff.  Patient reports family is aware of plan and she will alert them of when she leaves on Saturday. MD sign FL2  Lane Hacker, MSW Clinical Social Work: Emergency Room [phone]

## 2015-12-11 ENCOUNTER — Inpatient Hospital Stay
Admission: RE | Admit: 2015-12-11 | Discharge: 2015-12-30 | Disposition: A | Discharge status: Home / Self Care | Payer: Medicare Other | Source: Ambulatory Visit | Attending: *Deleted | Admitting: *Deleted

## 2015-12-11 DIAGNOSIS — M1612 Unilateral primary osteoarthritis, left hip: Secondary | ICD-10-CM | POA: Diagnosis not present

## 2015-12-11 DIAGNOSIS — M545 Low back pain: Secondary | ICD-10-CM | POA: Diagnosis not present

## 2015-12-11 DIAGNOSIS — E119 Type 2 diabetes mellitus without complications: Secondary | ICD-10-CM | POA: Diagnosis not present

## 2015-12-11 DIAGNOSIS — Z794 Long term (current) use of insulin: Secondary | ICD-10-CM | POA: Diagnosis not present

## 2015-12-11 DIAGNOSIS — K5900 Constipation, unspecified: Secondary | ICD-10-CM | POA: Diagnosis not present

## 2015-12-11 DIAGNOSIS — R262 Difficulty in walking, not elsewhere classified: Secondary | ICD-10-CM | POA: Diagnosis not present

## 2015-12-11 DIAGNOSIS — J42 Unspecified chronic bronchitis: Secondary | ICD-10-CM | POA: Diagnosis not present

## 2015-12-11 DIAGNOSIS — M5430 Sciatica, unspecified side: Secondary | ICD-10-CM | POA: Diagnosis not present

## 2015-12-11 DIAGNOSIS — M5137 Other intervertebral disc degeneration, lumbosacral region: Secondary | ICD-10-CM | POA: Diagnosis not present

## 2015-12-11 DIAGNOSIS — K219 Gastro-esophageal reflux disease without esophagitis: Secondary | ICD-10-CM | POA: Diagnosis not present

## 2015-12-11 DIAGNOSIS — M6281 Muscle weakness (generalized): Secondary | ICD-10-CM | POA: Diagnosis not present

## 2015-12-11 DIAGNOSIS — M4806 Spinal stenosis, lumbar region: Secondary | ICD-10-CM | POA: Diagnosis not present

## 2015-12-11 DIAGNOSIS — J209 Acute bronchitis, unspecified: Secondary | ICD-10-CM | POA: Diagnosis not present

## 2015-12-11 DIAGNOSIS — R05 Cough: Secondary | ICD-10-CM | POA: Diagnosis not present

## 2015-12-11 DIAGNOSIS — I10 Essential (primary) hypertension: Secondary | ICD-10-CM | POA: Diagnosis not present

## 2015-12-11 DIAGNOSIS — R278 Other lack of coordination: Secondary | ICD-10-CM | POA: Diagnosis not present

## 2015-12-11 DIAGNOSIS — Z96642 Presence of left artificial hip joint: Secondary | ICD-10-CM | POA: Diagnosis not present

## 2015-12-11 DIAGNOSIS — M1990 Unspecified osteoarthritis, unspecified site: Secondary | ICD-10-CM | POA: Diagnosis not present

## 2015-12-11 LAB — GLUCOSE, CAPILLARY
GLUCOSE-CAPILLARY: 179 mg/dL — AB (ref 65–99)
GLUCOSE-CAPILLARY: 177 mg/dL — AB (ref 65–99)

## 2015-12-11 LAB — CBC
HCT: 30.5 % — ABNORMAL LOW (ref 36.0–46.0)
HEMOGLOBIN: 10.2 g/dL — AB (ref 12.0–15.0)
MCH: 30.8 pg (ref 26.0–34.0)
MCHC: 33.4 g/dL (ref 30.0–36.0)
MCV: 92.1 fL (ref 78.0–100.0)
PLATELETS: 268 10*3/uL (ref 150–400)
RBC: 3.31 MIL/uL — AB (ref 3.87–5.11)
RDW: 14.2 % (ref 11.5–15.5)
WBC: 10.4 10*3/uL (ref 4.0–10.5)

## 2015-12-11 MED ORDER — HYDROCODONE-ACETAMINOPHEN 7.5-325 MG PO TABS: 1.0000 | ORAL_TABLET | ORAL | Status: DC | PRN

## 2015-12-11 MED ORDER — ASPIRIN 325 MG PO TBEC: 325.0000 mg | DELAYED_RELEASE_TABLET | Freq: Two times a day (BID) | ORAL | Status: DC

## 2015-12-11 MED ORDER — HYDROCODONE-ACETAMINOPHEN 7.5-325 MG PO TABS
1.0000 | ORAL_TABLET | ORAL | Status: DC | PRN
  Administered 2015-12-11: 1 via ORAL
  Filled 2015-12-11: qty 1

## 2015-12-11 NOTE — Progress Notes (Signed)
Patient has ambulated w/walker from bed to bathroom x 2 with this writer present this shift w/o difficult.

## 2015-12-11 NOTE — Discharge Summary (Signed)
Physician Discharge Summary  Patient ID: [name] MRN: [id number] DOB/AGE: [birth year] 65 y.o.  Admit date: 12/08/2015 Discharge date: 12/11/2015  Admission Diagnoses: PRIMARY OSTEOARTHRITIS LEFT HIP   Discharge Diagnoses: SAME   Active Problems:   Arthritis of left hip   Osteoarthritis of left hip LUMBAR STENOSIS, DISC PROTRUSION   Past Medical History  Diagnosis Date  . Diabetes mellitus   . Hypertension   . Cancer ([place])     ovarian  . COPD (chronic obstructive pulmonary disease) ([place])   . Arthritis      Discharged Condition: good  Hospital Course:  3/22- LEFT THA DIRECT LATERAL APPROACH 3/23- STARTED PT 3/24- GAIT IMPROVED WITH ASSIST  3/25- DISCHARGE TO [facility]  LAST PT NOTE  Coordinated with RN prior to session for any additional available analgesia, which seems to have helped somewhat. Pt received in chair. Pt demonstrates improved pain response and facility in functional mobility with mildly decreased pain inhibition. Pt able to demonstrate improved hip flexion AROM during gait cycle this session and progress gait to 24 feet, alternating forward and retro ambulation as cued. Balance remains modI. Reviewed LAQ seated this session and asked pt to perform 3x daily for 15 sets while seated. Pt is agreeable, and reports she has been working on her ankle pumps while seated. Anticipated DC to [facility] tomorrow, which is appropriate from the PT perspective, hence she will not need to be seen in the morning.   CBC Latest Ref Rng 12/11/2015 12/10/2015 12/09/2015  WBC 4.0 - 10.5 K/uL 10.4 11.5(H) 7.9  Hemoglobin 12.0 - 15.0 g/dL 10.2(L) 10.9(L) 10.4(L)  Hematocrit 36.0 - 46.0 % 30.5(L) 32.2(L) 31.6(L)  Platelets 150 - 400 K/uL 268 285 257    BMP Latest Ref Rng 12/09/2015 12/03/2015 04/03/2013  Glucose 65 - 99 mg/dL 157(H) 160(H) 274(H)  BUN 6 - 20 mg/dL 16 15 19   Creatinine 0.44 - 1.00 mg/dL 0.92 0.90 0.83  Sodium 135 - 145 mmol/L 135 134(L) 134(L)  Potassium 3.5 - 5.1  mmol/L 3.8 4.1 4.4  Chloride 101 - 111 mmol/L 100(L) 98(L) 97  CO2 22 - 32 mmol/L 28 26 27   Calcium 8.9 - 10.3 mg/dL 8.7(L) 9.2 9.2      Discharge Exam: Blood pressure 120/51, pulse 70, temperature 98.2 F (36.8 C), temperature source Oral, resp. rate 20, height 5\' 5"  (1.651 m), weight 262 lb (118.842 kg), SpO2 100 %.   Disposition: 01-Home or Self Care  Discharge Instructions    Call MD / Call 911    Complete by:  As directed   If you experience chest pain or shortness of breath, CALL 911 and be transported to the hospital emergency room.  If you develope a fever above 101 F, pus (white drainage) or increased drainage or redness at the wound, or calf pain, call your surgeon's office.     Change dressing    Complete by:  As directed   change daily as needed     Constipation Prevention    Complete by:  As directed   Drink plenty of fluids.  Prune juice may be helpful.  You may use a stool softener, such as Colace (over the counter) 100 mg twice a day.  Use MiraLax (over the counter) for constipation as needed.     Diet - low sodium heart healthy    Complete by:  As directed      Discharge instructions    Complete by:  As directed   Direct Lateral approach  precautions     Driving restrictions    Complete by:  As directed   No driving for 4 weeks     Follow the hip precautions as taught in Physical Therapy    Complete by:  As directed      Increase activity slowly as tolerated    Complete by:  As directed             Medication List    STOP taking these medications        HYDROcodone-acetaminophen 5-325 MG tablet  Commonly known as:  NORCO/VICODIN  Replaced by:  HYDROcodone-acetaminophen 7.5-325 MG tablet     meloxicam 15 MG tablet  Commonly known as:  MOBIC     traMADol 50 MG tablet  Commonly known as:  ULTRAM      TAKE these medications        albuterol 108 (90 Base) MCG/ACT inhaler  Commonly known as:  PROVENTIL HFA;VENTOLIN HFA  Inhale 2 puffs into the  lungs every 4 (four) hours as needed for wheezing or shortness of breath.     aspirin 325 MG EC tablet  Take 1 tablet (325 mg total) by mouth 2 (two) times daily.     atenolol 50 MG tablet  Commonly known as:  TENORMIN  Take 50 mg by mouth daily.     CALCIUM PO  Take 1 tablet by mouth daily.     HYDROcodone-acetaminophen 7.5-325 MG tablet  Commonly known as:  NORCO  Take 1 tablet by mouth every 4 (four) hours as needed for moderate pain.     ibuprofen 800 MG tablet  Commonly known as:  ADVIL,MOTRIN  Take 1 tablet (800 mg total) by mouth 3 (three) times daily.     insulin NPH Human 100 UNIT/ML injection  Commonly known as:  HUMULIN N,NOVOLIN N  Inject into the skin.     insulin regular 100 units/mL injection  Commonly known as:  NOVOLIN R,HUMULIN R  Inject into the skin 3 (three) times daily before meals.     lisinopril 10 MG tablet  Commonly known as:  PRINIVIL,ZESTRIL  Take 10 mg by mouth daily.     triamterene-hydrochlorothiazide 37.5-25 MG tablet  Commonly known as:  MAXZIDE-25  Take 1 tablet by mouth daily.     verapamil 240 MG (CO) 24 hr tablet  Commonly known as:  COVERA HS  Take 240 mg by mouth every morning.     VITAMIN D PO  Take 1 tablet by mouth daily.           Follow-up Information    Follow up with Arther Abbott, MD.   Specialties:  Orthopedic Surgery, Radiology   Contact information:   [address] [place] [address] [phone]       Follow up On 12/17/2015.      Signed: Arther Abbott 12/11/2015, 11:24 AM

## 2015-12-11 NOTE — Progress Notes (Signed)
Patient discharging to [facility].  IV removed - WNL.  Report called and given to Lyn at [facility].  Patient will e transferred via [facility] by staff

## 2015-12-14 ENCOUNTER — Non-Acute Institutional Stay (SKILLED_NURSING_FACILITY): Payer: Medicare Other | Admitting: Internal Medicine

## 2015-12-14 ENCOUNTER — Encounter: Payer: Self-pay | Admitting: Internal Medicine

## 2015-12-14 DIAGNOSIS — Z794 Long term (current) use of insulin: Secondary | ICD-10-CM | POA: Diagnosis not present

## 2015-12-14 DIAGNOSIS — E119 Type 2 diabetes mellitus without complications: Secondary | ICD-10-CM

## 2015-12-14 DIAGNOSIS — IMO0001 Reserved for inherently not codable concepts without codable children: Secondary | ICD-10-CM

## 2015-12-14 DIAGNOSIS — M1612 Unilateral primary osteoarthritis, left hip: Secondary | ICD-10-CM

## 2015-12-14 NOTE — Progress Notes (Signed)
This is a comprehensive admission to [facility] personally performed by Unice Cobble MD on this date less than 30 days from date of admission. PCP: Dr. Berdine Addison; Orthopedic surgeon Dr. Arther Abbott HPI: She was hospitalized 3/22-3/25/17 for a left total hip arthroplasty via direct lateral approach. No perioperative complications reported; physical therapy was initiated 3/23 she was discharged to [facility] 3/25.  Past medical and surgical history: She has insulin-dependent diabetes mellitus; hypertension; history of ovarian cancer; and COPD. She has had bilateral total knee replacements. Endoscopy in 2015 revealed erosive reflux esophagitis. She had esophageal dilation that year.  Social history: Significant is her having stopped smoking 10/05/2015. She does not drink.  Family history: There was no family history in the chart. Parents had diabetes and her mother had a stroke.  Comprehensive review of systems: She has had  Sneezing and a cough productive of thick green sputum. It is decreasing in volume &  consistency. She has had constipation until today when she had a normal bowel movement. She does have frequent thirst as well as urinary frequency. Fasting blood sugars range 74->230. Her ophthalmologic exam is overdue. She believes she may have had an A1c at her PCPs office 2 weeks ago. She has occasional numbness in her hands; this is in the context of a past history of carpal tunnel syndrome. Constitutional: No fever, chills, significant weight change, fatigue or night sweats Eyes: No redness, discharge, pain, blurred vision, double vision or loss of vision ENT/mouth: No nasal congestion, postnasal drainage,epistaxis, purulent discharge, earache, hearing loss, tinnitus ,sore throat , dental pain or hoarseness   Cardiovascular: No chest pain, palpitations, racing, irregular rhythm, syncope, claudication or edema  Respiratory: No hemoptysis,  dyspnea, paroxysmal  nocturnal dyspnea, pleuritic chest pain, significant snoring or  apnea    Gastrointestinal: No heartburn,dysphagia, nausea and vomiting,abdominal pain, diarrhea, rectal bleeding or melena Genitourinary: No dysuria,hematuria, pyuria,  urgency,  incontinence, nocturia, or dark urine  Musculoskeletal: No myalgias or muscle cramping, joint stiffness, joint swelling, joint color change, weakness Dermatologic: No rash, pruritus, urticaria, or change in color or temperature of skin Neurologic: No headache, vertigo, limb weakness, tremor, gait disturbance, seizures, memory loss, numbness or tingling Psychiatric: No significant anxiety or depression, panic attacks, insomnia, or anorexia Endocrine: No change in hair/skin/ nails, excessive hunger or unexplained fatigue Hematologic/lymphatic: No bruising, lymphadenopathy or  abnormal clotting Allergy/immunology: No itchy/ watery eyes, rhinitis, urticaria or angioedema  Physical exam:  Pertinent or positive findings: She has complete dentures. Irregular pterygium formation is present bilaterally over the sclerae. Grade 1 systolic murmur is present. Breath sounds are distant. Abdomen is protuberant. Fusiform deformity of the knees is noted. She has marked crepitus of the knees bilaterally. General appearance:Adequately nourished; no acute distress or increased work of breathing is present.   Lymphatic: No  lymphadenopathy about the head, neck, or axilla . Eyes: No conjunctival inflammation or lid edema is present. There is no scleral icterus. Ears:  External ear exam shows no significant lesions or deformities.   Nose:  External nasal examination shows no deformity or inflammation. Nasal mucosa are pink and moist without lesions or exudates No septal dislocation or deviation.No obstruction to airflow.  Oral exam: lips and gums are healthy appearing.There is no oropharyngeal erythema or exudate . Neck:  No deformities, thyromegaly, masses, or tenderness noted.     Heart:  Normal rate and regular rhythm. S1 and S2 normal without gallop, click, rub or other extra sounds.  Lungs:Chest clear to auscultation;  no wheezes, rhonchi,rales ,or rubs present. Abdomen:Bowel sounds are normal. Abdomen is soft and nontender with no organomegaly, hernias  or masses. GU: deferred as previously addressed. Extremities:  No cyanosis, edema, or clubbing noted. Neurologic exam :Strength equal & normal in upper & lower extremities Deep tendon reflexes are normal @ biceps Skin: Warm & dry w/o tenting  No significant lesions or rash.  See summary under each active problem in the Problem List with associated updated therapeutic plan

## 2015-12-14 NOTE — Assessment & Plan Note (Signed)
Continue physical therapy

## 2015-12-14 NOTE — Assessment & Plan Note (Addendum)
Verify recent A1c @ Dr Cathey Endow office

## 2015-12-14 NOTE — Patient Instructions (Signed)
See summary under each active problem in the Problem List with associated updated therapeutic plan. Completed document faxed to [facility].

## 2015-12-15 ENCOUNTER — Other Ambulatory Visit: Payer: Self-pay

## 2015-12-15 MED ORDER — HYDROCODONE-ACETAMINOPHEN 7.5-325 MG PO TABS: 1.0000 | ORAL_TABLET | ORAL | Status: DC | PRN

## 2015-12-15 NOTE — Telephone Encounter (Signed)
RX faxed to [facility] @ [phone]. Phone number [phone]  

## 2015-12-17 ENCOUNTER — Encounter: Payer: Self-pay | Admitting: Orthopedic Surgery

## 2015-12-17 ENCOUNTER — Ambulatory Visit: Payer: Medicare Other | Admitting: Orthopedic Surgery

## 2015-12-17 ENCOUNTER — Encounter: Payer: Self-pay | Admitting: Internal Medicine

## 2015-12-17 ENCOUNTER — Non-Acute Institutional Stay (SKILLED_NURSING_FACILITY): Payer: Medicare Other | Admitting: Internal Medicine

## 2015-12-17 VITALS — Ht 65.0 in | Wt 264.0 lb

## 2015-12-17 DIAGNOSIS — R05 Cough: Secondary | ICD-10-CM | POA: Diagnosis not present

## 2015-12-17 DIAGNOSIS — J209 Acute bronchitis, unspecified: Secondary | ICD-10-CM

## 2015-12-17 DIAGNOSIS — M5137 Other intervertebral disc degeneration, lumbosacral region: Secondary | ICD-10-CM

## 2015-12-17 DIAGNOSIS — IMO0001 Reserved for inherently not codable concepts without codable children: Secondary | ICD-10-CM

## 2015-12-17 DIAGNOSIS — Z8679 Personal history of other diseases of the circulatory system: Secondary | ICD-10-CM | POA: Diagnosis not present

## 2015-12-17 DIAGNOSIS — E119 Type 2 diabetes mellitus without complications: Secondary | ICD-10-CM

## 2015-12-17 DIAGNOSIS — Z96649 Presence of unspecified artificial hip joint: Secondary | ICD-10-CM

## 2015-12-17 DIAGNOSIS — M1612 Unilateral primary osteoarthritis, left hip: Secondary | ICD-10-CM

## 2015-12-17 DIAGNOSIS — R059 Cough, unspecified: Secondary | ICD-10-CM | POA: Insufficient documentation

## 2015-12-17 DIAGNOSIS — Z794 Long term (current) use of insulin: Secondary | ICD-10-CM

## 2015-12-17 DIAGNOSIS — J42 Unspecified chronic bronchitis: Secondary | ICD-10-CM

## 2015-12-17 NOTE — Progress Notes (Signed)
Patient ID: [name], female   DOB: [birth year], 65 y.o.   MRN: [id number]    This is an acute visit.  Level care skilled.  Facility [facility].  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  Patient is a pleasant 65 year old female-she is here for rehabilitation after undergoing a left hip replacement using the direct lateral approach/secondary to end-stage osteoarthritis.  She appears to have tolerated the procedure well-she does have a history of COPD I note she is on albuterol inhalers when necessary--she has been complaining of increased cough does not really complain of shortness of breath however.  She reports a history of coughing up green phlegm apparently this is persisting as well as a cough  She is also a type II diabetic she is on Novolin N 30 units in the morning as well as Novolin R 15 units with meals 3 times a day-nursing staff has noted occasionally some lower blood sugars later in the day I see 170 listed on Wednesday, March 29-morning sugars appear to be in the mid 100s fairly consistently-at noon also more mid 100s-at 4 PM lower 100s I see a 70 listed as well.  N at at bedtime sugars variable from 122 up to 205.  According to nursing she is somewhat symptomatic of lower blood sugars at times although the readings except for that 70 are not very remarkable-apparently she does become a bit jittery at certain levels.  She states she previously had been on 12 units of Novolin R with meals.  She has seen orthopedics earlier today and thought to be doing well in regards to her hip replacement she does have some mild lower extremity edema this was thought to be fairly unremarkable by orthopedics and she says it has gone down some since the surgery.  She would like ice pack however to help with some discomfort in the hip  area.  She also is on ibuprofen she feels she does not really needed at this point in will discontinue this.  Her vital signs appear to be stable her blood pressure appears to be somewhat elevated today at 151/71-I see previous systolics ranging from [id number] to 170-we do not have very many readings But suspect we may have to go up on an medication possibly her lisinopril if there are consistent elevations.  Currently she appears to be in good spirits and otherwise has no complaints  Past medical and surgical history: She has insulin-dependent diabetes mellitus; hypertension; history of ovarian cancer; and COPD. She has had bilateral total knee replacements. Endoscopy in 2015 revealed erosive reflux esophagitis. She had esophageal dilation that year.  Social history: Significant is her having stopped smoking 10/05/2015. She does not drink.  Family history: There was no family history in the chart. Parents had diabetes and her mother had a stroke.  Comprehensive review of systems:   Constitutional: No fever, chills, significant weight change, fatigue or night sweats Eyes: No redness, discharge, pain, blurred vision, double vision or loss of vision ENT/mouth: No nasal congestion, postnasal drainage,epistaxis, purulent discharge, earache, hearing loss, tinnitus ,sore throat , dental pain o--she is complaining of some hoarseness with persistent cough and phlegm production   Cardiovascular: No chest pain, palpitations, racing, irregular rhythm, syncope, claudication or edema other than mild edema left leg status post hip replacement Respiratory: Not complaining of shortness of breath but has a fairly persistent cough with green phlegm production Gastrointestinal:  No heartburn,dysphagia, nausea and vomiting,abdominal pain, diarrhea, rectal bleeding or melena Genitourinary: No dysuria,hematuria, pyuria,  urgency,  incontinence, nocturia, or dark urine  Musculoskeletal: Complains of some mild left hip  discomfort Dermatologic: No rash, pruritus, urticaria, or change in color or temperature of skin Neurologic: No headache, vertigo, limb weakness, tremor, gait disturbance, seizures, memory loss, numbness or tingling Psychiatric: No significant anxiety or depression, panic attacks, insomnia, or anorexia Endocrine: History of diabetes with apparently some symptomatic hypoglycemia at times Allergy/immunology: No itchy/ watery eyes, rhinitis, urticaria or angioedema  Physical exam:  Temperature 97.9 pulse 63 respirations 18 blood pressure 151/71 O2 saturation is in the 90s on room air   General appearance:Adequately nourished; no acute distress or increased work of breathing is present.   Lymphatic: No  lymphadenopathy about the head, neck, or axilla . Eyes: No conjunctival inflammation or lid edema is present. There is no scleral icterus.  Nose:  External nasal examination shows no deformity or inflammation. Nasal mucosa are pink and moist without lesions or exudates  Oral exam: lips and gums are healthy appearing.There is no oropharyngeal erythema or exudate . Neck:  No deformities, thyromegaly, masses, or tenderness noted.    Heart:  Normal rate and regular rhythm. Without significant murmur gallop or rub.  Lungs:Chest clear to auscultation; no wheezes, rhonchi,rales ,or rubs present. Abdomen:Bowel sounds are normal. Abdomen is soft and nontender with no organomegaly, hernias  or masses. Extremities: Mild edema noted of left leg Neurologic exam :Strength equal & normal in upper & lower extremities--speech is clear Skin: Warm & dry w/o tenting  No significant lesions or rash Psych pleasant and appropriate alert and oriented 3  Labs.  12/11/2015.  WBC 10.4 hemoglobin 10.2 platelets 268.  23rd 2017.  Sodium 135 potassium 3.8 BUN 16 creatinine 0.92.  Assessment and plan.  #1-cough with continued green phlegm production-this has been persisting now apparently for several days-will  give a short course of Avelox 400 mg daily for 5 days secondary to concerns of possible bronchitis sinusitis-also will update a CBC with differential and BMP.  Also will add albuterol nebulizers every 6 hours when necessary with history of COPD she has been on nebulizers before apparently tolerated these well.  Also will add Mucinex 600 mg twice a day for 7 days to help with the cough and expectoration.  #2 history of left hip repair she appears to have tolerated this well hemoglobin of 10.2 on March 25 appears stable with postop hemoglobin in the hospital again we will recheck this. .  For pain she is on Norco 7..5/325 milligrams every 4 hours when necessary pain this appears to be helping she does not feel she needs the ibuprofen for now and we will discontinue this.  She is on aspirin for anticoagulation 325 mg twice a day.  She is also on calcium supplementation   #3 history of diabetes-blood sugars do not appear all that remarkable but apparently she does have at times some symptoms of hypoglycemia with shaking I do note there is a 70 later in the day she had been on lower doses of Novolin R with meals previously will reduce this from 15 units to 10 units 3 times a day with meals and monitor  #4-history hypertension as noted above she has variable systolics ranging from the 130s did-150s-since we don't have very many readings would like to monitor this a bit more-she is on numerous medications including atenolol 50 mg a day-lisinopril 10 mg a day-and Maxide 25 daily-she  is also on verapamil 240 mg every morning  [id number]-of note greater than 35 minutes spent assessing patient-reviewing her chart-discussing her concerns at bedside-and coordinating and formulating a plan of care for numerous diagnoses-of note greater than 50% of time spent coordinating plan of care including chart review lab review discussion with patient as well as with nursing about her multiple issues

## 2015-12-17 NOTE — Progress Notes (Signed)
Patient ID: [name], female   DOB: 1951/07/30, 65 y.o.   MRN: [id number]   Of note I have reviewed her medications.  They include.  Albuterol inhaler every 4 hours when necessary.  Aspirin 325 mg twice a day.  Atenolol 50 mg daily.  Os-Cal with vitamin D 500-200 units daily.  Norco 7.5-325 mg tablet every 4 hours when necessary pain.  Ibuprofen which will be discontinued.  Humalog and 30 units every morning.  Humulin R-15 units 3 times a day with meals again we'll reduce this down to 10 mg 3 times a day with meals.  Lisinopril 10 mg daily.  Maxide 25-1 tablet daily.  Verapamil 240 mg every morning.  Vitamin D 1000 units daily

## 2015-12-17 NOTE — Progress Notes (Signed)
Chief Complaint  Patient presents with  . Follow-up    post op 1, Left THA, DOS 12/08/15   Postop day 9 wound check wound is pristine no leg edema she's not having any back pain except when lying flat  We will continue therapy at the [facility] come back in 2 weeks  [facility] out on April 5

## 2015-12-18 ENCOUNTER — Encounter (HOSPITAL_COMMUNITY)
Admission: RE | Admit: 2015-12-18 | Discharge: 2015-12-18 | Disposition: A | Discharge status: Home / Self Care | Payer: Medicare Other | Source: Skilled Nursing Facility | Attending: Internal Medicine | Admitting: Internal Medicine

## 2015-12-18 LAB — CBC WITH DIFFERENTIAL/PLATELET
BASOS ABS: 0 10*3/uL (ref 0.0–0.1)
BASOS PCT: 0 %
EOS ABS: 0.5 10*3/uL (ref 0.0–0.7)
EOS PCT: 5 %
HCT: 31.6 % — ABNORMAL LOW (ref 36.0–46.0)
Hemoglobin: 10.6 g/dL — ABNORMAL LOW (ref 12.0–15.0)
Lymphocytes Relative: 27 %
Lymphs Abs: 2.7 10*3/uL (ref 0.7–4.0)
MCH: 30.9 pg (ref 26.0–34.0)
MCHC: 33.5 g/dL (ref 30.0–36.0)
MCV: 92.1 fL (ref 78.0–100.0)
MONO ABS: 0.8 10*3/uL (ref 0.1–1.0)
Monocytes Relative: 8 %
Neutro Abs: 6.1 10*3/uL (ref 1.7–7.7)
Neutrophils Relative %: 60 %
PLATELETS: 346 10*3/uL (ref 150–400)
RBC: 3.43 MIL/uL — AB (ref 3.87–5.11)
RDW: 13.9 % (ref 11.5–15.5)
WBC: 10.1 10*3/uL (ref 4.0–10.5)

## 2015-12-18 LAB — COMPREHENSIVE METABOLIC PANEL
ALT: 10 U/L — AB (ref 14–54)
AST: 10 U/L — AB (ref 15–41)
Albumin: 3.1 g/dL — ABNORMAL LOW (ref 3.5–5.0)
Alkaline Phosphatase: 50 U/L (ref 38–126)
Anion gap: 7 (ref 5–15)
BUN: 15 mg/dL (ref 6–20)
CALCIUM: 8.6 mg/dL — AB (ref 8.9–10.3)
CHLORIDE: 102 mmol/L (ref 101–111)
CO2: 27 mmol/L (ref 22–32)
CREATININE: 0.93 mg/dL (ref 0.44–1.00)
GFR calc Af Amer: >60 mL/min (ref >60)
Glucose, Bld: 133 mg/dL — ABNORMAL HIGH (ref 65–99)
POTASSIUM: 3.7 mmol/L (ref 3.5–5.1)
SODIUM: 136 mmol/L (ref 135–145)
TOTAL PROTEIN: 5.7 g/dL — AB (ref 6.5–8.1)
Total Bilirubin: 0.7 mg/dL (ref 0.3–1.2)

## 2015-12-20 ENCOUNTER — Encounter (HOSPITAL_COMMUNITY)
Admission: RE | Admit: 2015-12-20 | Discharge: 2015-12-20 | Disposition: A | Discharge status: Home / Self Care | Payer: Medicare Other | Source: Skilled Nursing Facility | Attending: *Deleted | Admitting: *Deleted

## 2015-12-20 LAB — CBC WITH DIFFERENTIAL/PLATELET
BASOS PCT: 0 %
Basophils Absolute: 0 10*3/uL (ref 0.0–0.1)
Eosinophils Absolute: 0.4 10*3/uL (ref 0.0–0.7)
Eosinophils Relative: 5 %
HEMATOCRIT: 32.1 % — AB (ref 36.0–46.0)
HEMOGLOBIN: 10.6 g/dL — AB (ref 12.0–15.0)
LYMPHS ABS: 2.1 10*3/uL (ref 0.7–4.0)
Lymphocytes Relative: 24 %
MCH: 30.3 pg (ref 26.0–34.0)
MCHC: 33 g/dL (ref 30.0–36.0)
MCV: 91.7 fL (ref 78.0–100.0)
MONOS PCT: 9 %
Monocytes Absolute: 0.8 10*3/uL (ref 0.1–1.0)
NEUTROS PCT: 62 %
Neutro Abs: 5.6 10*3/uL (ref 1.7–7.7)
Platelets: 325 10*3/uL (ref 150–400)
RBC: 3.5 MIL/uL — AB (ref 3.87–5.11)
RDW: 13.8 % (ref 11.5–15.5)
WBC: 9 10*3/uL (ref 4.0–10.5)

## 2015-12-20 LAB — COMPREHENSIVE METABOLIC PANEL
ALBUMIN: 3.1 g/dL — AB (ref 3.5–5.0)
ALK PHOS: 50 U/L (ref 38–126)
ALT: 11 U/L — AB (ref 14–54)
ANION GAP: 6 (ref 5–15)
AST: 10 U/L — ABNORMAL LOW (ref 15–41)
BILIRUBIN TOTAL: 0.6 mg/dL (ref 0.3–1.2)
BUN: 18 mg/dL (ref 6–20)
CO2: 28 mmol/L (ref 22–32)
CREATININE: 0.85 mg/dL (ref 0.44–1.00)
Calcium: 8.7 mg/dL — ABNORMAL LOW (ref 8.9–10.3)
Chloride: 101 mmol/L (ref 101–111)
GFR calc Af Amer: >60 mL/min (ref >60)
GFR calc non Af Amer: >60 mL/min (ref >60)
Glucose, Bld: 181 mg/dL — ABNORMAL HIGH (ref 65–99)
Potassium: 3.9 mmol/L (ref 3.5–5.1)
Sodium: 135 mmol/L (ref 135–145)
TOTAL PROTEIN: 5.6 g/dL — AB (ref 6.5–8.1)

## 2015-12-23 ENCOUNTER — Other Ambulatory Visit: Payer: Self-pay | Admitting: *Deleted

## 2015-12-23 MED ORDER — HYDROCODONE-ACETAMINOPHEN 7.5-325 MG PO TABS: 1.0000 | ORAL_TABLET | ORAL | Status: DC | PRN

## 2015-12-23 NOTE — Telephone Encounter (Signed)
[facility]/ [facility]

## 2015-12-28 ENCOUNTER — Encounter: Payer: Self-pay | Admitting: Internal Medicine

## 2015-12-28 ENCOUNTER — Non-Acute Institutional Stay (SKILLED_NURSING_FACILITY): Payer: Medicare Other | Admitting: Internal Medicine

## 2015-12-28 DIAGNOSIS — M1612 Unilateral primary osteoarthritis, left hip: Secondary | ICD-10-CM | POA: Diagnosis not present

## 2015-12-28 DIAGNOSIS — J209 Acute bronchitis, unspecified: Secondary | ICD-10-CM

## 2015-12-28 DIAGNOSIS — E119 Type 2 diabetes mellitus without complications: Secondary | ICD-10-CM

## 2015-12-28 DIAGNOSIS — J42 Unspecified chronic bronchitis: Secondary | ICD-10-CM

## 2015-12-28 DIAGNOSIS — Z794 Long term (current) use of insulin: Secondary | ICD-10-CM

## 2015-12-28 DIAGNOSIS — I10 Essential (primary) hypertension: Secondary | ICD-10-CM | POA: Diagnosis not present

## 2015-12-28 DIAGNOSIS — IMO0001 Reserved for inherently not codable concepts without codable children: Secondary | ICD-10-CM

## 2015-12-28 NOTE — Progress Notes (Signed)
Patient ID: [name], female   DOB: 10-13-1950, 65 y.o.   MRN: [id number]  Location:  [facility] Service:  SNF (31)    PCP: Maggie Font, MD Patient Care Team: [name], MD as PCP - General Daneil Dolin, MD as Consulting Physician (Gastroenterology)  Extended Emergency Contact Information Primary Emergency Contact: Gan,Arthur Address: [id number]          [place], [place] [address] Johnnette Litter of [place] Phone: [phone] Work Phone: [phone] Mobile Phone: [phone] Relation: Son   Goals of care:  Advanced Directive information Advanced Directives 12/28/2015  Does patient have an advance directive? Yes  Type of Advance Directive Living will  Does patient want to make changes to advanced directive? No - Patient declined  Copy of advanced directive(s) in chart? Yes  Would patient like information on creating an advanced directive? -     Allergies  Allergen Reactions  . Gabapentin Other (See Comments)    suicidal thoughts    Chief Complaint  Patient presents with  . Discharge Note    HPI:  65 y.o. female  seen today for discharge.  She is here for rehabilitation after undergoing a left hip replacement secondary to end-stage osteoarthritis apparently tolerated this fairly well.  Hemoglobin has been stable at 10.6.  We have adjusted her insulin slightly secondary to concerns of some lower blood sugars during the day she is currently on Novolin R 3 times a day 10 units this was reduced from 15 units and blood sugars appear to be stabilized largely in the mid 100s to low 200s she does not complain of any symptoms of hypoglycemia.  She continues to have somewhat elevated systolic blood pressures at 160/84 noted most recently appears she runs fairly consistently 140-160 area-she is on numerous medications including atenolol 50 mg a day lisinopril 10 mg a day and Maxide37.5 25 mg a day.  She will be going home by herself  although she is quite independent here in the facility is walking about quite well in her walker appears to be good spirits is looking forward to going home.      Past Medical History  Diagnosis Date  . Diabetes mellitus     Insulin-dependent diabetes  . Hypertension   . Cancer ([facility])     ovarian  . COPD (chronic obstructive pulmonary disease) ([facility])   . Arthritis     Past Surgical History  Procedure Laterality Date  . Replacement total knee      bilateral  . Cholecystectomy    . Colonoscopy  2007    Dr. Gala Romney: internal hemorrhoids, few scattered sigmoid diverticula  . Esophagogastroduodenoscopy  2008    Dr. Gala Romney: erosive esophagitis, patulous EG junction  . Colonoscopy N/A 04/09/2014    [id number] diverticulosis. Redundant colon. Single colonic polyp-removed as described above.  . Esophagogastroduodenoscopy N/A 04/09/2014    [id number] reflux esophagitis. Schatzki's ring-status post dilation as described above. Hiatal hernia. Gastricerosions-status post gastric biopsy  . Savory dilation N/A 04/09/2014    Procedure: SAVORY DILATION;  Surgeon: Daneil Dolin, MD;  Location: [facility];  Service: Endoscopy;  Laterality: N/A;  Venia Minks dilation N/A 04/09/2014    Procedure: Venia Minks DILATION;  Surgeon: Daneil Dolin, MD;  Location: [facility];  Service: Endoscopy;  Laterality: N/A;  . Abdominal hysterectomy      BSO  . Abdominal surgery      for bleeding after hysterectomy  . Left  hip replacement  12/08/2015  . Total hip arthroplasty Left 12/08/2015    Procedure: TOTAL HIP ARTHROPLASTY;  Surgeon: Carole Civil, MD;  Location: [facility];  Service: Orthopedics;  Laterality: Left;      reports that she quit smoking about 2 months ago. Her smoking use included Cigarettes. She has a 54 pack-year smoking history. She has never used smokeless tobacco. She reports that she does not drink alcohol or use illicit drugs. Social History   Social History  . Marital Status: Widowed      Spouse Name: N/A  . Number of Children: N/A  . Years of Education: N/A   Occupational History  . Not on file.   Social History Main Topics  . Smoking status: Former Smoker -- 1.00 packs/day for 54 years    Types: Cigarettes    Quit date: 10/05/2015  . Smokeless tobacco: Never Used  . Alcohol Use: No  . Drug Use: No  . Sexual Activity: No   Other Topics Concern  . Not on file   Social History Narrative   Functional Status Survey:    Allergies  Allergen Reactions  . Gabapentin Other (See Comments)    suicidal thoughts    Pertinent  Health Maintenance Due  Topic Date Due  . FOOT EXAM  07/13/1960  . OPHTHALMOLOGY EXAM  07/13/1960  . URINE MICROALBUMIN  07/13/1960  . PAP SMEAR  07/14/1971  . HEMOGLOBIN A1C  10/03/2013  . PNA vac Low Risk Adult (1 of 2 - PCV13) 07/14/2015  . INFLUENZA VACCINE  04/18/2016  . MAMMOGRAM  08/15/2017  . COLONOSCOPY  04/09/2024  . DEXA SCAN  Completed    Medications: Albuterol nebulizers every 6 hours when necessary.  Aspirin 325 mg twice a day.  Atenolol 50 mg daily.  Colace 100 mg twice a day.  Humulin N 30 units every morning.  Vicodin 7.5-325 mg every 4 hours when necessary.  Lisinopril 10 mg daily.  Maxide 30 7. 5-25 milligrams daily.  MiraLAX daily when necessary.  Novolin R 10 units with meals 3 times a day.  Os-Cal with vitamin D 500 mg daily.  Proventil inhaler before hours when necessary.  Verapamil 2040 mg every morning.  Vitamin D thousand units daily.     Review of Systems   General no complaints of fever or chills.  Head ears eyes nose mouth and throat not complaining of any visual changes or sore throat or nasal discharge.  Respiratory does not complain of shortness breath or cough.  She does have a history COPD.  Cardiac does not complain of chest pain as some left foot edema but this appears to be decreasing.  GI does not complain of abdominal pain nausea vomiting diarrhea  constipation.  GU does not clinic dysuria.  Muscle skeletal is gaining strength status post left hip replacement is now ambulating with a rolling walker appears to be doing relatively well but still has some weakness.  Neurologic does not complain of dizziness or headache or syncopal-type feelings.  Psych is not complaining of depression or anxiety.    Filed Vitals:   12/28/15 1125  BP: 160/84  Pulse: 66  Temp: 98.6 F (37 C)  TempSrc: Oral  Resp: 24  Height: 5\' 5"  (1.651 m)  Weight: 266 lb (120.657 kg)   Body mass index is 44.26 kg/(m^2). Physical Exam  Dr. 98.0 pulse 66 respirations 24 blood pressure 160/84.  In general this is a pleasant elderly female in no distress sitting couple inside of  her bed.  Her skin is warm and dry-well-healed surgical scar on the left hip.  Eyes pupils appear reactive light visual acuity appears grossly intact sclera and conjunctiva are clear.  Oropharynx clear mucous membranes moist.  Chest is clear to auscultation there is no labored breathing.  Heart is regular rate and rhythm without murmur gallop or rub she has some mild left foot edema this appears to be decreasing positive pedal pulse is nonerythematous nontender.  Abdomen soft nontender positive bowel sounds.  Muscle skeletal is able to ambulate fairly well with a rolling walker moves all extremities 4 upper extremity strength appears to be intact.  Neurologic is grossly intact no lateralizing findings her speech is clear.  Psych she is alert and oriented pleasant and appropriate Labs reviewed: Basic Metabolic Panel:  Recent Labs  12/09/15 0600 12/18/15 0600 12/20/15 0630  NA 135 136 135  K 3.8 3.7 3.9  CL 100* 102 101  CO2 28 27 28   GLUCOSE 157* 133* 181*  BUN 16 15 18   CREATININE 0.92 0.93 0.85  CALCIUM 8.7* 8.6* 8.7*   Liver Function Tests:  Recent Labs  12/18/15 0600 12/20/15 0630  AST 10* 10*  ALT 10* 11*  ALKPHOS 50 50  BILITOT 0.7 0.6  PROT 5.7*  5.6*  ALBUMIN 3.1* 3.1*   No results for input(s): LIPASE, AMYLASE in the last 8760 hours. No results for input(s): AMMONIA in the last 8760 hours. CBC:  Recent Labs  12/03/15 1320  12/11/15 0616 12/18/15 0600 12/20/15 0630  WBC 8.7  < > 10.4 10.1 9.0  NEUTROABS 5.3  --   --  6.1 5.6  HGB 13.1  < > 10.2* 10.6* 10.6*  HCT 39.3  < > 30.5* 31.6* 32.1*  MCV 91.6  < > 92.1 92.1 91.7  PLT 347  < > 268 346 325  < > = values in this interval not displayed. Cardiac Enzymes: No results for input(s): CKTOTAL, CKMB, CKMBINDEX, TROPONINI in the last 8760 hours. BNP: Invalid input(s): POCBNP CBG:  Recent Labs  12/10/15 1632 12/11/15 0735 12/11/15 1158  GLUCAP 143* 179* 177*    Procedures and Imaging Studies During Stay: Dg Pelvis Portable  12/08/2015  CLINICAL DATA:  Left total hip arthroplasty EXAM: PORTABLE PELVIS 1-2 VIEWS COMPARISON:  09/28/2015 left hip radiographs FINDINGS: Status post interval left total hip arthroplasty, with well-positioned left acetabular and left proximal femoral prostheses and no evidence of hip dislocation on this single frontal view. Surgical clips overlie the deep pelvis. Expected soft tissue gas within and surrounding the left hip joint. Vascular calcifications throughout the soft tissues. No osseous fracture or suspicious focal osseous lesion. IMPRESSION: Satisfactory appearance status post left total hip arthroplasty. Electronically Signed   By: Ilona Sorrel M.D.   On: 12/08/2015 12:37    Assessment/Plan:    #1-history of left hip repair secondary end-stage osteoarthritis she appears to be making good progress here will get outpatient therapy apparently-she is receiving Vicodin as needed for pain this appears to be stable she continues on aspirin twice a day for DVT prophylaxis she is on 325 mg.  #2 hypertension systolics continue to be elevated will increase her lisinopril to 20 mg today she continues on atenolol 50 mg a day and Maxide 37.5-25 mg a day  this will warrant follow up by primary care provider.  #3 history of diabetes she is on Humulin N 30 units every morning and Novolin R 10 units 3 times a day with meals this appears to  be relatively stable with blood sugars running in the mid 100s to low 200s-again the Novolin R was reduced secondary to concerns of hypoglycemia during the day.  #4 history of COPD this is been very stable during her stay here she does have nebulizers as needed.  Again patient will be going home she does live alone but appears to be quite independent and doing well will need outpatient follow-up.  [id number] note greater than 30 minutes spent on this discharge summary-greater than 50% of time spent coordinating plan of care for numerous diagnoses

## 2016-01-02 NOTE — Progress Notes (Signed)
Patient ID: [name], female   DOB: [birth year], 65 y.o.   MRN: [id number]

## 2016-01-03 ENCOUNTER — Ambulatory Visit: Payer: Medicare Other | Admitting: Orthopedic Surgery

## 2016-01-03 VITALS — BP 161/75 | HR 73 | Ht 65.0 in | Wt 266.0 lb

## 2016-01-03 DIAGNOSIS — Z4789 Encounter for other orthopedic aftercare: Secondary | ICD-10-CM

## 2016-01-03 DIAGNOSIS — Z96649 Presence of unspecified artificial hip joint: Secondary | ICD-10-CM

## 2016-01-03 DIAGNOSIS — M1612 Unilateral primary osteoarthritis, left hip: Secondary | ICD-10-CM

## 2016-01-03 NOTE — Progress Notes (Signed)
Chief Complaint  Patient presents with  . Follow-up    Left THA DOS 12/08/15    Postop left total hip patient says all of her pain prior to surgery is gone she's having some soreness and tightness in her left hip she has a small shoe lift on the right she is taking her medicine as prescribed she is in outpatient therapy she has a cane for assistance. Follow-up in 4 weeks

## 2016-01-05 DIAGNOSIS — Z6841 Body Mass Index (BMI) 40.0 and over, adult: Secondary | ICD-10-CM | POA: Diagnosis not present

## 2016-01-05 DIAGNOSIS — E1165 Type 2 diabetes mellitus with hyperglycemia: Secondary | ICD-10-CM | POA: Diagnosis not present

## 2016-01-05 DIAGNOSIS — I10 Essential (primary) hypertension: Secondary | ICD-10-CM | POA: Diagnosis not present

## 2016-01-06 ENCOUNTER — Encounter (HOSPITAL_COMMUNITY): Payer: Self-pay | Admitting: Emergency Medicine

## 2016-01-06 ENCOUNTER — Emergency Department (HOSPITAL_COMMUNITY): Payer: Medicare Other

## 2016-01-06 ENCOUNTER — Emergency Department (HOSPITAL_COMMUNITY)
Admission: EM | Admit: 2016-01-06 | Discharge: 2016-01-06 | Disposition: A | Discharge status: Home / Self Care | Payer: Medicare Other | Attending: Emergency Medicine | Admitting: Emergency Medicine

## 2016-01-06 DIAGNOSIS — Z8543 Personal history of malignant neoplasm of ovary: Secondary | ICD-10-CM | POA: Insufficient documentation

## 2016-01-06 DIAGNOSIS — Y939 Activity, unspecified: Secondary | ICD-10-CM | POA: Insufficient documentation

## 2016-01-06 DIAGNOSIS — M1990 Unspecified osteoarthritis, unspecified site: Secondary | ICD-10-CM | POA: Insufficient documentation

## 2016-01-06 DIAGNOSIS — I10 Essential (primary) hypertension: Secondary | ICD-10-CM | POA: Diagnosis not present

## 2016-01-06 DIAGNOSIS — J449 Chronic obstructive pulmonary disease, unspecified: Secondary | ICD-10-CM | POA: Diagnosis not present

## 2016-01-06 DIAGNOSIS — X58XXXA Exposure to other specified factors, initial encounter: Secondary | ICD-10-CM | POA: Diagnosis not present

## 2016-01-06 DIAGNOSIS — Z87891 Personal history of nicotine dependence: Secondary | ICD-10-CM | POA: Diagnosis not present

## 2016-01-06 DIAGNOSIS — Y999 Unspecified external cause status: Secondary | ICD-10-CM | POA: Insufficient documentation

## 2016-01-06 DIAGNOSIS — T148 Other injury of unspecified body region: Secondary | ICD-10-CM | POA: Diagnosis not present

## 2016-01-06 DIAGNOSIS — S79912A Unspecified injury of left hip, initial encounter: Secondary | ICD-10-CM | POA: Diagnosis present

## 2016-01-06 DIAGNOSIS — M25552 Pain in left hip: Secondary | ICD-10-CM | POA: Diagnosis not present

## 2016-01-06 DIAGNOSIS — Z96642 Presence of left artificial hip joint: Secondary | ICD-10-CM | POA: Diagnosis not present

## 2016-01-06 DIAGNOSIS — Z794 Long term (current) use of insulin: Secondary | ICD-10-CM | POA: Diagnosis not present

## 2016-01-06 DIAGNOSIS — Y929 Unspecified place or not applicable: Secondary | ICD-10-CM | POA: Insufficient documentation

## 2016-01-06 DIAGNOSIS — E119 Type 2 diabetes mellitus without complications: Secondary | ICD-10-CM | POA: Insufficient documentation

## 2016-01-06 DIAGNOSIS — T84021A Dislocation of internal left hip prosthesis, initial encounter: Secondary | ICD-10-CM | POA: Diagnosis not present

## 2016-01-06 DIAGNOSIS — Z471 Aftercare following joint replacement surgery: Secondary | ICD-10-CM | POA: Diagnosis not present

## 2016-01-06 DIAGNOSIS — S73005A Unspecified dislocation of left hip, initial encounter: Secondary | ICD-10-CM | POA: Diagnosis not present

## 2016-01-06 MED ORDER — FENTANYL CITRATE (PF) 100 MCG/2ML IJ SOLN
50.0000 ug | Freq: Once | INTRAMUSCULAR | Status: AC
  Administered 2016-01-06: 50 ug via INTRAVENOUS
  Filled 2016-01-06: qty 2

## 2016-01-06 MED ORDER — HYDROCODONE-ACETAMINOPHEN 5-325 MG PO TABS: 1.0000 | ORAL_TABLET | ORAL | Status: DC | PRN

## 2016-01-06 MED ORDER — PROPOFOL 10 MG/ML IV BOLUS
INTRAVENOUS | Status: AC | PRN
  Administered 2016-01-06: 50 mg via INTRAVENOUS

## 2016-01-06 MED ORDER — PROPOFOL 10 MG/ML IV BOLUS
200.0000 mg | Freq: Once | INTRAVENOUS | Status: DC
  Filled 2016-01-06: qty 20

## 2016-01-06 NOTE — Discharge Instructions (Signed)
Hip Dislocation Hip dislocation is the displacement of the "ball" at the head of your thigh bone (femur) from its socket in the hip bone (pelvis). The ball-and-socket structure of the hip joint gives it a lot of stability, while allowing it to move freely. Therefore, a lot of force is required to displace the femur from its socket. A hip dislocation is an emergency. If you believe you have dislocated your hip and cannot move your leg, call for help immediately. Do not try to move. CAUSES The most common cause of hip dislocation is motor vehicle accidents. However, force from falls from a height (a ladder or building), injuries from contact sports, or injuries from industrial accidents can be enough to dislocate your hip. SYMPTOMS A hip dislocation is very painful. If you have a dislocated hip, you will not be able to move your hip. If you have nerve damage, you may not have feeling in your lower leg, foot, or ankle.  DIAGNOSIS Usually, your caregiver can diagnose a hip dislocation by looking at the position of your leg. Generally, X-ray exams are done to check for fractures in your femur or pelvis. The leg of the dislocated hip will appear shorter than the other leg, and your foot will be turned inward. TREATMENT  Your caregiver can manipulate your bones back into the joint (reduction). If there are no other complications involved with your dislocation, such as fractures or damage to blood vessels or nerves, this procedure can be done without surgery. Before this procedure, you will be given medicine so that you will not feel pain (anesthetic). Often specialized imaging exams are done after the reduction (magnetic resonance imaging [MRI] or computed tomography [CT]) to check for loose pieces of cartilage or bone in the joint. If a manual reduction fails or you have nerve damage, damage to your blood vessels, or bone fractures, surgery will be necessary to perform the reduction.  HOME CARE  INSTRUCTIONS The following measures can help to reduce pain and speed up the healing process:  Rest your injured joint. Do not move your joint if it is painful. Also, avoid activities similar to the one that caused your injury.  Apply ice to your injured joint for 1 to 2 days after your reduction or as directed by your caregiver. Applying ice helps to reduce inflammation and pain.  Put ice in a plastic bag.  Place a towel between your skin and the bag.  Leave the ice on for 15 to 20 minutes at a time, every 2 hours while you are awake.  Use crutches or a walker as directed by your caregiver.  Exercise your hip and leg as directed by your caregiver.  Take over-the-counter or prescription medicine for pain as directed by your caregiver. SEEK IMMEDIATE MEDICAL CARE IF:  Your pain becomes worse rather than better.  You feel like your hip has become dislocated again. MAKE SURE YOU:  Understand these instructions.  Will watch your condition.  Will get help right away if you are not doing well or get worse.   This information is not intended to replace advice given to you by your health care provider. Make sure you discuss any questions you have with your health care provider.   Document Released: 05/30/2001 Document Revised: 09/25/2014 Document Reviewed: 04/06/2015 Elsevier Interactive Patient Education [employer].

## 2016-01-06 NOTE — ED Provider Notes (Signed)
CSN: [id number]     Arrival date & time 01/06/16  2032 History  By signing my name below, I, [name], attest that this documentation has been prepared under the direction and in the presence of Hannah Furry, MD. Electronically Signed: Helane Welch, ED Scribe. 01/06/2016. 10:05 PM.    Chief Complaint  Patient presents with  . Hip Injury   The history is provided by the patient and a relative. No language interpreter was used.   HPI Comments: [name] is a 65 y.o. female former smoker with a PMHx of arthritis, HTN, DM, and ovarian cancer, as well as a PSHx of total left hip arthroplasty (12/08/2015) who presents to the Emergency Department complaining of an injury to the left hip sustained just PTA. Per son, pt had hip surgery on 3/22 and was released from [facility] center 1 week ago. He states that pt was sitting in a chair bending over to plug in her charger, when she felt her left hip "pop" and has been unable to move it ever since. Pt reports associated left hip pain.   Past Medical History  Diagnosis Date  . Diabetes mellitus     Insulin-dependent diabetes  . Hypertension   . Cancer ([place])     ovarian  . COPD (chronic obstructive pulmonary disease) ([place])   . Arthritis    Past Surgical History  Procedure Laterality Date  . Replacement total knee      bilateral  . Cholecystectomy    . Colonoscopy  2007    Dr. Gala Romney: internal hemorrhoids, few scattered sigmoid diverticula  . Esophagogastroduodenoscopy  2008    Dr. Gala Romney: erosive esophagitis, patulous EG junction  . Colonoscopy N/A 04/09/2014    [id number] diverticulosis. Redundant colon. Single colonic polyp-removed as described above.  . Esophagogastroduodenoscopy N/A 04/09/2014    [id number] reflux esophagitis. Schatzki's ring-status post dilation as described above. Hiatal hernia. Gastricerosions-status post gastric biopsy  . Savory dilation N/A 04/09/2014    Procedure: SAVORY DILATION;  Surgeon: Daneil Dolin, MD;   Location: [facility];  Service: Endoscopy;  Laterality: N/A;  Venia Minks dilation N/A 04/09/2014    Procedure: Venia Minks DILATION;  Surgeon: Daneil Dolin, MD;  Location: [facility];  Service: Endoscopy;  Laterality: N/A;  . Abdominal hysterectomy      BSO  . Abdominal surgery      for bleeding after hysterectomy  . Left hip replacement  12/08/2015  . Total hip arthroplasty Left 12/08/2015    Procedure: TOTAL HIP ARTHROPLASTY;  Surgeon: Carole Civil, MD;  Location: [facility] ORS;  Service: Orthopedics;  Laterality: Left;   Family History  Problem Relation Age of Onset  . Colon cancer Neg Hx   . Stroke Mother   . Diabetes Mother   . Diabetes Father    Social History  Substance Use Topics  . Smoking status: Former Smoker -- 1.00 packs/day for 54 years    Types: Cigarettes    Quit date: 10/05/2015  . Smokeless tobacco: Never Used  . Alcohol Use: No   OB History    No data available     Review of Systems  Constitutional: Negative for fever, chills, diaphoresis, appetite change and fatigue.  HENT: Negative for mouth sores, sore throat and trouble swallowing.   Eyes: Negative for visual disturbance.  Respiratory: Negative for cough, chest tightness, shortness of breath and wheezing.   Cardiovascular: Negative for chest pain.  Gastrointestinal: Negative for nausea, vomiting, abdominal pain, diarrhea and abdominal distention.  Endocrine: Negative for polydipsia, polyphagia and polyuria.  Genitourinary: Negative for dysuria, frequency and hematuria.  Musculoskeletal: Positive for arthralgias. Negative for gait problem.  Skin: Negative for color change, pallor and rash.  Neurological: Negative for dizziness, syncope, light-headedness and headaches.  Hematological: Does not bruise/bleed easily.  Psychiatric/Behavioral: Negative for behavioral problems and confusion.    Allergies  Gabapentin  Home Medications   Prior to Admission medications   Medication Sig Start Date End  Date Taking? Authorizing Provider  albuterol (ACCUNEB) 1.25 MG/3ML nebulizer solution Take 1 ampule by nebulization every 6 (six) hours as needed for wheezing or shortness of breath.    Historical Provider, MD  albuterol (PROVENTIL HFA;VENTOLIN HFA) 108 (90 BASE) MCG/ACT inhaler Inhale 2 puffs into the lungs every 4 (four) hours as needed for wheezing or shortness of breath. 04/03/13   Rexene Alberts, MD  aspirin EC 325 MG EC tablet Take 1 tablet (325 mg total) by mouth 2 (two) times daily. 12/11/15   Carole Civil, MD  atenolol (TENORMIN) 50 MG tablet Take 50 mg by mouth daily.    Historical Provider, MD  calcium-vitamin D (OSCAL WITH D) 500-200 MG-UNIT tablet Take 1 tablet by mouth daily.    Historical Provider, MD  Cholecalciferol (VITAMIN D3) 1000 units CAPS Take by mouth daily.    Historical Provider, MD  docusate sodium (COLACE) 100 MG capsule Take 100 mg by mouth 2 (two) times daily.    Historical Provider, MD  HYDROcodone-acetaminophen (NORCO/VICODIN) 5-325 MG tablet Take 1 tablet by mouth every 4 (four) hours as needed. 01/06/16   Hannah Furry, MD  insulin NPH Human (HUMULIN N,NOVOLIN N) 100 UNIT/ML injection Inject 30 Units into the skin daily.     Historical Provider, MD  insulin regular (NOVOLIN R,HUMULIN R) 100 units/mL injection Inject 10 Units into the skin 3 (three) times daily before meals.     Historical Provider, MD  lisinopril (PRINIVIL,ZESTRIL) 10 MG tablet Take 10 mg by mouth daily.    Historical Provider, MD  polyethylene glycol (MIRALAX / GLYCOLAX) packet Take 17 g by mouth daily as needed.    Historical Provider, MD  triamterene-hydrochlorothiazide (MAXZIDE-25) 37.5-25 MG per tablet Take 1 tablet by mouth daily.    Historical Provider, MD  verapamil (COVERA HS) 240 MG (CO) 24 hr tablet Take 240 mg by mouth every morning.    Historical Provider, MD   BP 127/60 mmHg  Pulse 65  Temp(Src) 98.2 F (36.8 C) (Oral)  Resp 18  Ht 5\' 5"  (1.651 m)  Wt 260 lb (117.935 kg)  BMI  43.27 kg/m2  SpO2 95% Physical Exam  Constitutional: She is oriented to person, place, and time. She appears well-developed and well-nourished. No distress.  HENT:  Head: Normocephalic.  Eyes: Conjunctivae are normal. Pupils are equal, round, and reactive to light. No scleral icterus.  Neck: Normal range of motion. Neck supple. No thyromegaly present.  Cardiovascular: Normal rate and regular rhythm.  Exam reveals no gallop and no friction rub.   No murmur heard. Pulmonary/Chest: Effort normal and breath sounds normal. No respiratory distress. She has no wheezes. She has no rales.  Abdominal: Soft. Bowel sounds are normal. She exhibits no distension. There is no tenderness. There is no rebound.  Musculoskeletal: She exhibits tenderness.  C/o pain at the L hip, LLE shortened and externally rotated  Neurological: She is alert and oriented to person, place, and time.  Skin: Skin is warm and dry. No rash noted.  Psychiatric: She has a normal mood  and affect. Her behavior is normal.    ED Course  Procedures  DIAGNOSTIC STUDIES: Oxygen Saturation is 98% on RA, normal by my interpretation.    COORDINATION OF CARE: 9:00 PM - Discussed plans to wait on diagnostic imaging. Pt advised of plan for treatment and pt agrees.    Labs Review Labs Reviewed - No data to display  Imaging Review Dg Hip Port Unilat With Pelvis 1v Left  01/06/2016  CLINICAL DATA:  Postreduction EXAM: DG HIP (WITH OR WITHOUT PELVIS) 1V PORT LEFT COMPARISON:  01/06/2016 FINDINGS: Interval relocation of left hip arthroplasty. No evidence of significant residual dislocation. No acute fracture. Vascular calcifications. IMPRESSION: Left hip arthroplasty is relocated. Electronically Signed   By: Lucienne Capers M.D.   On: 01/06/2016 22:26   Dg Hip Unilat With Pelvis 2-3 Views Left  01/06/2016  CLINICAL DATA:  Left hip pain.  Felt a pop while sitting in a chair. EXAM: DG HIP (WITH OR WITHOUT PELVIS) 2-3V LEFT COMPARISON:   12/08/2015 FINDINGS: Left total hip arthroplasty using non cemented components and screw fixation of the acetabular component. Anterior and superior dislocation of the femoral prosthesis from the acetabular cup. Steep acetabular angle. No acute fractures demonstrated. Vascular calcifications. Surgical clips in the pelvis. Pelvis appears intact. SI joints and symphysis pubis are not displaced. IMPRESSION: Anterior and superior dislocation of the left hip femoral prosthesis from the acetabular cup. Electronically Signed   By: Lucienne Capers M.D.   On: 01/06/2016 21:18   I have personally reviewed and evaluated these images and lab results as part of my medical decision-making.   EKG Interpretation None      MDM   Final diagnoses:  Hip dislocation, left, initial encounter ([place])   Reduction of dislocation Date/Time: 11:26 PM Performed by: Lolita Patella Authorized by: Lolita Patella Consent: Verbal consent obtained. Risks and benefits: risks, benefits and alternatives were discussed Consent given by: patient Required items: required blood products, implants, devices, and special equipment available Time out: Immediately prior to procedure a "time out" was called to verify the correct patient, procedure, equipment, support staff and site/side marked as required.  Patient sedated: yes, propofol  Vitals: Vital signs were monitored during sedation. Patient tolerance: Patient tolerated the procedure well with no immediate complications. Joint: Lt hip  Reduction technique: Flexion, internal rotation, anterior distraction done on first attempt without dificulty    Procedural sedation Performed by: Lolita Patella Consent: Verbal consent obtained. Risks and benefits: risks, benefits and alternatives were discussed Required items: required blood products, implants, devices, and special equipment available Patient identity confirmed: arm band and provided demographic data Time  out: Immediately prior to procedure a "time out" was called to verify the correct patient, procedure, equipment, support staff and site/side marked as required.  Sedation type: moderate (conscious) sedation NPO time confirmed and considedered  Sedatives: PROPOFOL  Physician Time at Bedside: 30 minutes  Vitals: Vital signs were monitored during sedation. Cardiac Monitor, pulse oximeter Patient tolerance: Patient tolerated the procedure well with no immediate complications. Comments: Pt with uneventful recovered. Returned to pre-procedural sedation baseline      I personally performed the services described in this documentation, which was scribed in my presence. The recorded information has been reviewed and is accurate.   Hannah Furry, MD 01/06/16 [phone]

## 2016-01-06 NOTE — ED Notes (Signed)
Patient had left hip surgery on March 22, released from [facility] last Thursday to home, today was sitting in chair and bent over to plug in charger and her something pop.

## 2016-01-06 NOTE — ED Notes (Signed)
Applied knee immobilizer to left leg, patient tolerated well, assisted to car via wheelchair, NAD noted.

## 2016-01-07 ENCOUNTER — Telehealth: Payer: Self-pay | Admitting: Orthopedic Surgery

## 2016-01-07 ENCOUNTER — Ambulatory Visit (HOSPITAL_COMMUNITY): Payer: Medicare Other | Admitting: Physical Therapy

## 2016-01-07 NOTE — Telephone Encounter (Signed)
SHE WENT TO ER LAST NIGHT  I CALLED HER THIS AM   SHE WAS SITTING DOWN (IN A CHAIR THAT SHE USUALLY SITS IN ) BUT THIS TIME TRIED TO PLUG IN A CHARGER AND SHE DISLOCATED THE HIP.  I WILL SEE HER TUES   NO THERAPY UNTIL THEN  I REVIEWED ALL IMAGING.  THERE IS NO FRACTURE THE CUP POSITION SHOWS ACCEPTABLE ANTEVERSION OF THE CUP  I REVIEWED HER HIP PRECAUTIONS AND ADVISED THAT A SECOND DISLCOATION WILL REQUIRE ADDITIONAL SURGERY

## 2016-01-07 NOTE — Telephone Encounter (Signed)
Patient called to relay that she had been taken by EMS to [facility] Emergency room last night regarding her left hip (she is status/post left total hip replacement 12/08/15) -- states her hip "went out of place" last evening when she went to sit down in chair.  States had Xrays at [facility], then had hip "put back into place" under anesthesia.  Also said has physical therapy scheduled.  Please advise patient.  Also advise if next schedule appointment 01/31/16 is okay or if other follow up needed.

## 2016-01-07 NOTE — Telephone Encounter (Signed)
Called patient to relay, per Dr Aline Brochure, to come in Tuesday morning, reached answer machine; left message.

## 2016-01-07 NOTE — Telephone Encounter (Signed)
Spoke with patient and advised to skip physical therapy today. Patient states she is only sore today, no pain.

## 2016-01-10 NOTE — Telephone Encounter (Signed)
Confirmed appointment with patient for tomorrow, 01/11/16, as noted.

## 2016-01-11 ENCOUNTER — Ambulatory Visit: Payer: Medicare Other | Admitting: Orthopedic Surgery

## 2016-01-11 ENCOUNTER — Encounter: Payer: Self-pay | Admitting: Orthopedic Surgery

## 2016-01-11 VITALS — BP 198/80 | HR 67 | Ht 65.0 in | Wt 266.0 lb

## 2016-01-11 DIAGNOSIS — Z96649 Presence of unspecified artificial hip joint: Secondary | ICD-10-CM

## 2016-01-11 DIAGNOSIS — Z4789 Encounter for other orthopedic aftercare: Secondary | ICD-10-CM

## 2016-01-11 NOTE — Progress Notes (Signed)
Patient ID: [name], female   DOB: 07/15/1951, 65 y.o.   MRN: [id number]  Chief Complaint  Patient presents with  . Follow-up    Follow up from hospital, Left hip reduction 01/06/16, S/P THA 12/08/15    HPI status post Left total hip replacement direct lateral approach approximately a month ago had a dislocation on the 20th had closed reduction in the hospital postop films were normal  Patient complains of soreness  ROS- denies numbness in the left leg  BP 198/80 mmHg  Pulse 67  Ht 5\' 5"  (1.651 m)  Wt 266 lb (120.657 kg)  BMI 44.26 kg/m2  Physical Exam  Constitutional: She is oriented to person, place, and time. She appears well-developed and well-nourished. No distress.  Cardiovascular: Normal rate and intact distal pulses.   Neurological: She is alert and oriented to person, place, and time. She has normal reflexes. She exhibits normal muscle tone. Coordination normal.  Skin: Skin is warm and dry. No rash noted. She is not diaphoretic. No erythema. No pallor.  Psychiatric: She has a normal mood and affect. Her behavior is normal. Judgment and thought content normal.  - Ortho Exam  ambulatory with a cane getting in and out of the bed normally. Hip flexion normal no pain abduction and adduction and flexion normal leg lengths equal neurovascular exam intact    ASSESSMENT AND PLAN   Review hip precautions  Keep appointment  Resume therapy Monday with new orders

## 2016-01-12 ENCOUNTER — Other Ambulatory Visit: Payer: Self-pay | Admitting: *Deleted

## 2016-01-12 DIAGNOSIS — Z96642 Presence of left artificial hip joint: Secondary | ICD-10-CM

## 2016-01-14 ENCOUNTER — Emergency Department (HOSPITAL_COMMUNITY): Payer: Medicare Other

## 2016-01-14 ENCOUNTER — Encounter (HOSPITAL_COMMUNITY): Payer: Self-pay

## 2016-01-14 ENCOUNTER — Inpatient Hospital Stay (HOSPITAL_COMMUNITY)
Admission: EM | Admit: 2016-01-14 | Discharge: 2016-01-16 | DRG: 561 | Disposition: A | Discharge status: Home / Self Care | Payer: Medicare Other | Attending: Orthopedic Surgery | Admitting: Orthopedic Surgery

## 2016-01-14 DIAGNOSIS — Z87891 Personal history of nicotine dependence: Secondary | ICD-10-CM

## 2016-01-14 DIAGNOSIS — Z823 Family history of stroke: Secondary | ICD-10-CM | POA: Diagnosis not present

## 2016-01-14 DIAGNOSIS — IMO0001 Reserved for inherently not codable concepts without codable children: Secondary | ICD-10-CM

## 2016-01-14 DIAGNOSIS — Z96649 Presence of unspecified artificial hip joint: Secondary | ICD-10-CM

## 2016-01-14 DIAGNOSIS — Y92009 Unspecified place in unspecified non-institutional (private) residence as the place of occurrence of the external cause: Secondary | ICD-10-CM

## 2016-01-14 DIAGNOSIS — I10 Essential (primary) hypertension: Secondary | ICD-10-CM | POA: Diagnosis not present

## 2016-01-14 DIAGNOSIS — Z833 Family history of diabetes mellitus: Secondary | ICD-10-CM | POA: Diagnosis not present

## 2016-01-14 DIAGNOSIS — J449 Chronic obstructive pulmonary disease, unspecified: Secondary | ICD-10-CM | POA: Diagnosis present

## 2016-01-14 DIAGNOSIS — Z794 Long term (current) use of insulin: Secondary | ICD-10-CM | POA: Diagnosis not present

## 2016-01-14 DIAGNOSIS — E119 Type 2 diabetes mellitus without complications: Secondary | ICD-10-CM | POA: Diagnosis present

## 2016-01-14 DIAGNOSIS — Z85038 Personal history of other malignant neoplasm of large intestine: Secondary | ICD-10-CM | POA: Diagnosis not present

## 2016-01-14 DIAGNOSIS — T84029A Dislocation of unspecified internal joint prosthesis, initial encounter: Secondary | ICD-10-CM

## 2016-01-14 DIAGNOSIS — Y792 Prosthetic and other implants, materials and accessory orthopedic devices associated with adverse incidents: Secondary | ICD-10-CM | POA: Diagnosis present

## 2016-01-14 DIAGNOSIS — T84498A Other mechanical complication of other internal orthopedic devices, implants and grafts, initial encounter: Secondary | ICD-10-CM | POA: Insufficient documentation

## 2016-01-14 DIAGNOSIS — M25552 Pain in left hip: Secondary | ICD-10-CM | POA: Diagnosis not present

## 2016-01-14 DIAGNOSIS — T148 Other injury of unspecified body region: Secondary | ICD-10-CM | POA: Diagnosis not present

## 2016-01-14 DIAGNOSIS — T84021A Dislocation of internal left hip prosthesis, initial encounter: Principal | ICD-10-CM | POA: Diagnosis present

## 2016-01-14 DIAGNOSIS — S73006A Unspecified dislocation of unspecified hip, initial encounter: Secondary | ICD-10-CM

## 2016-01-14 DIAGNOSIS — M25559 Pain in unspecified hip: Secondary | ICD-10-CM | POA: Diagnosis not present

## 2016-01-14 MED ORDER — FENTANYL CITRATE (PF) 100 MCG/2ML IJ SOLN
50.0000 ug | Freq: Once | INTRAMUSCULAR | Status: AC
  Administered 2016-01-14: 50 ug via INTRAVENOUS
  Filled 2016-01-14: qty 2

## 2016-01-14 MED ORDER — ONDANSETRON HCL 4 MG/2ML IJ SOLN
4.0000 mg | Freq: Once | INTRAMUSCULAR | Status: AC
  Administered 2016-01-14: 4 mg via INTRAVENOUS
  Filled 2016-01-14: qty 2

## 2016-01-14 NOTE — ED Notes (Signed)
Pt had a left  hip replacement in March, states she went to sit down in a chair and felt it pop out tonight.  Pt was here for same last week for reduction of the dislocation

## 2016-01-14 NOTE — ED Provider Notes (Signed)
CSN: [id number]     Arrival date & time 01/14/16  2200 History  By signing my name below, I, Dora Sims, attest that this documentation has been prepared under the direction and in the presence of physician practitioner, Rolland Porter, MD AT 23:30 pm. Electronically Signed: Dora Sims, Scribe. 01/14/2016. 11:31 PM.     Chief Complaint  Patient presents with  . Hip Injury  . Dislocation    The history is provided by the patient. No language interpreter was used.     HPI Comments: [name] is a 65 y.o. female with h/o arthritis, DM, and HTN and a PSHx of total left hip arthroplasty who presents to the Emergency Department complaining of left hip injury sustained approximately 3 hours ago. Pt states that she sat down in a chair and felt her left hip pop out of place. Pt does not believe that her left leg twisted when she sat down tonight. She states that her left hip popped out of place last week as well. She states that her left hip replacement surgery occurred on March 22nd of this year. The last time that she ate was around noon today. Pt denies other pains. She states her orthopedist told her if it happens again she is going to need surgery.   PCP: Dr. Berdine Addison Orthopedic Surgeon: Dr. Aline Brochure  Past Medical History  Diagnosis Date  . Diabetes mellitus     Insulin-dependent diabetes  . Hypertension   . Cancer ([place])     ovarian  . COPD (chronic obstructive pulmonary disease) ([place])   . Arthritis    Past Surgical History  Procedure Laterality Date  . Replacement total knee      bilateral  . Cholecystectomy    . Colonoscopy  2007    Dr. Gala Romney: internal hemorrhoids, few scattered sigmoid diverticula  . Esophagogastroduodenoscopy  2008    Dr. Gala Romney: erosive esophagitis, patulous EG junction  . Colonoscopy N/A 04/09/2014    [id number] diverticulosis. Redundant colon. Single colonic polyp-removed as described above.  . Esophagogastroduodenoscopy N/A 04/09/2014    [id number]  reflux esophagitis. Schatzki's ring-status post dilation as described above. Hiatal hernia. Gastricerosions-status post gastric biopsy  . Savory dilation N/A 04/09/2014    Procedure: SAVORY DILATION;  Surgeon: Daneil Dolin, MD;  [facility];  Service: Endoscopy;  Laterality: N/A;  Venia Minks dilation N/A 04/09/2014    Procedure: Venia Minks DILATION;  Surgeon: Daneil Dolin, MD;  Location: [facility];  Service: Endoscopy;  Laterality: N/A;  . Abdominal hysterectomy      BSO  . Abdominal surgery      for bleeding after hysterectomy  . Left hip replacement  12/08/2015  . Total hip arthroplasty Left 12/08/2015    Procedure: TOTAL HIP ARTHROPLASTY;  Surgeon: Carole Civil, MD;  Location: [facility];  Service: Orthopedics;  Laterality: Left;   Family History  Problem Relation Age of Onset  . Colon cancer Neg Hx   . Stroke Mother   . Diabetes Mother   . Diabetes Father    Social History  Substance Use Topics  . Smoking status: Former Smoker -- 1.00 packs/day for 54 years    Types: Cigarettes    Quit date: 10/05/2015  . Smokeless tobacco: Never Used  . Alcohol Use: No   OB History    No data available     Review of Systems  Musculoskeletal: Positive for arthralgias (left hip).  Neurological: Negative for numbness.  All other systems reviewed and are  negative.   Allergies  Gabapentin  Home Medications   Prior to Admission medications   Medication Sig Start Date End Date Taking? Authorizing Provider  albuterol (ACCUNEB) 1.25 MG/3ML nebulizer solution Take 1 ampule by nebulization every 6 (six) hours as needed for wheezing or shortness of breath.   Yes Historical Provider, MD  albuterol (PROVENTIL HFA;VENTOLIN HFA) 108 (90 BASE) MCG/ACT inhaler Inhale 2 puffs into the lungs every 4 (four) hours as needed for wheezing or shortness of breath. 04/03/13  Yes Rexene Alberts, MD  aspirin EC 81 MG tablet Take 81 mg by mouth daily.   Yes Historical Provider, MD  atenolol  (TENORMIN) 50 MG tablet Take 50 mg by mouth daily.   Yes Historical Provider, MD  calcium-vitamin D (OSCAL WITH D) 500-200 MG-UNIT tablet Take 1 tablet by mouth daily.   Yes Historical Provider, MD  Cholecalciferol (VITAMIN D3) 1000 units CAPS Take by mouth daily.   Yes Historical Provider, MD  HYDROcodone-acetaminophen (NORCO/VICODIN) 5-325 MG tablet Take 1 tablet by mouth every 4 (four) hours as needed. Patient taking differently: Take 1 tablet by mouth every 4 (four) hours as needed for moderate pain or severe pain.  01/06/16  Yes [name], MD  insulin NPH Human (HUMULIN N,NOVOLIN N) 100 UNIT/ML injection Inject 12-28 Units into the skin daily. 28 in the morning and 12 in the afternoon   Yes Historical Provider, MD  insulin regular (NOVOLIN R,HUMULIN R) 100 units/mL injection Inject 15-25 Units into the skin 2 (two) times daily.    Yes Historical Provider, MD  lisinopril (PRINIVIL,ZESTRIL) 10 MG tablet Take 10 mg by mouth every evening.    Yes Historical Provider, MD  triamterene-hydrochlorothiazide (MAXZIDE-25) 37.5-25 MG per tablet Take 1 tablet by mouth every morning.    Yes Historical Provider, MD  verapamil (COVERA HS) 240 MG (CO) 24 hr tablet Take 240 mg by mouth every morning.   Yes Historical Provider, MD   BP 183/82 mmHg  Pulse 69  Temp(Src) 98.9 F (37.2 C) (Oral)  Resp 22  Ht 5\' 5"  (1.651 m)  Wt 260 lb (117.935 kg)  BMI 43.27 kg/m2  SpO2 99%  Vital signs normal   Physical Exam  Constitutional: She is oriented to person, place, and time. She appears well-developed and well-nourished.  Non-toxic appearance. She does not appear ill. No distress.  HENT:  Head: Normocephalic and atraumatic.  Right Ear: External ear normal.  Left Ear: External ear normal.  Nose: Nose normal. No mucosal edema or rhinorrhea.  Mouth/Throat: Mucous membranes are normal. No dental abscesses or uvula swelling.  Eyes: Conjunctivae and EOM are normal.  Neck: Normal range of motion and full passive  range of motion without pain.  Pulmonary/Chest: Effort normal. No respiratory distress. She has no rhonchi. She exhibits no crepitus.  Abdominal: Normal appearance.  Musculoskeletal: She exhibits no edema or tenderness.  Shortening of LLE with external rotation.  Neurological: She is alert and oriented to person, place, and time. She has normal strength. No cranial nerve deficit.  Skin: Skin is warm, dry and intact. No rash noted. No erythema. No pallor.  Psychiatric: She has a normal mood and affect. Her speech is normal and behavior is normal. Her mood appears not anxious.  Nursing note and vitals reviewed.   ED Course  Procedures (including critical care time)  Medications  fentaNYL (SUBLIMAZE) injection 50 mcg (50 mcg Intravenous Given 01/14/16 2348)  ondansetron (ZOFRAN) injection 4 mg (4 mg Intravenous Given 01/14/16 2348)  propofol (DIPRIVAN) 10  mg/mL bolus/IV push 200 mg (0 mg Intravenous Stopped 01/15/16 0120)  HYDROmorphone (DILAUDID) injection 1 mg (1 mg Intravenous Given 01/15/16 0033)     12:35 AM Spoke with Dr. Aline Brochure; will reduce hip in ER and pt will follow up with him.   DIAGNOSTIC STUDIES: Oxygen Saturation is 99% on RA, normal by my interpretation.    COORDINATION OF CARE: 11:31 PM Discussed treatment plan with pt at bedside and pt agreed to plan.  00:19 Dr Aline Brochure, states to reduce the hip and call him back if unsuccessful, otherwise he will see her in the office.   01:25 AM Dr Aline Brochure, informed I was unable to relocate her hip, wants the OR team to be called in and CBC and BMET  Procedural sedation Performed by: Janice Norrie Consent: Verbal consent obtained. Risks and benefits: risks, benefits and alternatives were discussed Required items: required blood products, implants, devices, and special equipment available Patient identity confirmed: arm band and provided demographic data Time out: Immediately prior to procedure a "time out" was called to verify  the correct patient, procedure, equipment, support staff and site/side marked as required.  Sedation type: moderate (conscious) sedation NPO time confirmed and considedered  Sedatives: PROPOFOL given 70 mg  Physician Time at Bedside: 15 min  Vitals: Vital signs were monitored during sedation. Cardiac Monitor, pulse oximeter Patient tolerance: Patient tolerated the procedure well with no immediate complications. Comments: Pt with uneventful recovered. Returned to pre-procedural sedation baseline   Reduction of dislocation Date/Time: 01:15 Performed by: [place] by: Janice Norrie Consent: Verbal consent obtained. Risks and benefits: risks, benefits and alternatives were discussed Consent given by: patient Required items: required blood products, implants, devices, and special equipment available Time out: Immediately prior to procedure a "time out" was called to verify the correct patient, procedure, equipment, support staff and site/side marked as required.  Patient sedated: with propofol  Vitals: Vital signs were monitored during sedation. Patient tolerance: Patient tolerated the procedure well with no immediate complications. Joint: left hip  Reduction technique: flexion and traction, however, hip was not reduced.      Labs Review Results for orders placed or performed during the hospital encounter of 01/14/16  CBC with Differential  Result Value Ref Range   WBC 9.9 4.0 - 10.5 K/uL   RBC 3.74 (L) 3.87 - 5.11 MIL/uL   Hemoglobin 11.1 (L) 12.0 - 15.0 g/dL   HCT 34.0 (L) 36.0 - 46.0 %   MCV 90.9 78.0 - 100.0 fL   MCH 29.7 26.0 - 34.0 pg   MCHC 32.6 30.0 - 36.0 g/dL   RDW 13.7 11.5 - 15.5 %   Platelets 345 150 - 400 K/uL   Neutrophils Relative % 61 %   Neutro Abs 6.0 1.7 - 7.7 K/uL   Lymphocytes Relative 30 %   Lymphs Abs 3.0 0.7 - 4.0 K/uL   Monocytes Relative 6 %   Monocytes Absolute 0.6 0.1 - 1.0 K/uL   Eosinophils Relative 3 %   Eosinophils Absolute  0.3 0.0 - 0.7 K/uL   Basophils Relative 0 %   Basophils Absolute 0.0 0.0 - 0.1 K/uL  Basic metabolic panel  Result Value Ref Range   Sodium 137 135 - 145 mmol/L   Potassium 3.4 (L) 3.5 - 5.1 mmol/L   Chloride 101 101 - 111 mmol/L   CO2 28 22 - 32 mmol/L   Glucose, Bld 161 (H) 65 - 99 mg/dL   BUN 19 6 - 20 mg/dL  Creatinine, Ser 1.05 (H) 0.44 - 1.00 mg/dL   Calcium 9.2 8.9 - 10.3 mg/dL   GFR calc non Af Amer 55 (L) >60 mL/min   GFR calc Af Amer >60 >60 mL/min   Anion gap 8 5 - 15  Glucose, capillary  Result Value Ref Range   Glucose-Capillary 147 (H) 65 - 99 mg/dL  I-Stat Chem 8, ED  Result Value Ref Range   Sodium 137 135 - 145 mmol/L   Potassium 3.5 3.5 - 5.1 mmol/L   Chloride 97 (L) 101 - 111 mmol/L   BUN 19 6 - 20 mg/dL   Creatinine, Ser 1.10 (H) 0.44 - 1.00 mg/dL   Glucose, Bld 150 (H) 65 - 99 mg/dL   Calcium, Ion 1.16 1.13 - 1.30 mmol/L   TCO2 26 0 - 100 mmol/L   Hemoglobin 12.2 12.0 - 15.0 g/dL   HCT 36.0 36.0 - 46.0 %   Laboratory interpretation all normal except mild anemia, mild hypokalemia     Imaging Review   Dg Hip Port Unilat With Pelvis 1v Right  01/15/2016  CLINICAL DATA:  Attempted reduction of left hip prosthesis dislocation. Initial encounter. EXAM: DG HIP (WITH OR WITHOUT PELVIS) 1V PORT RIGHT COMPARISON:  Left hip radiographs performed 01/14/2016 FINDINGS: There is persistent superior dislocation of the patient's left hip prosthesis. There is no evidence of fracture. The left sacroiliac joint is grossly unremarkable. Scattered vascular calcifications are seen. IMPRESSION: Persistent superior dislocation of the left hip prosthesis. No evidence of fracture. Electronically Signed   By: Garald Balding M.D.   On: 01/15/2016 01:47   Dg Hip Unilat With Pelvis 2-3 Views Left  01/14/2016  CLINICAL DATA:  65 year old female with history of left-sided hip pain after sitting down this evening. Status post hip replacement on 12/08/2015. Prior hip dislocation. EXAM:  DG HIP (WITH OR WITHOUT PELVIS) 2-3V LEFT COMPARISON:  Multiple priors, most recently 01/06/2016. FINDINGS: As noted on prior exam 01/06/2016 there is again superior and lateral dislocation of the prosthetic femoral head from the left prosthetic acetabulum. On the lateral projection, the femoral head is mildly anteriorly displaced. No periprosthetic fracture adjacent to either the femoral stem or the acetabular cup. Bony pelvis appears intact. Right proximal femur appears intact as visualized. Numerous vascular calcifications. Numerous surgical clips are noted throughout the anatomic pelvis. IMPRESSION: 1. Left prosthetic hip dislocation, as above. 2. Negative for fracture. Electronically Signed   By: Vinnie Langton M.D.   On: 01/14/2016 23:27   I have personally reviewed and evaluated these images and lab results as part of my medical decision-making.    MDM   Final diagnoses:  Dislocation of internal left hip prosthesis, initial encounter [facility])   Plan Dr Aline Brochure is taking to the OR for reduction   Rolland Porter, MD, FACEP   I personally performed the services described in this documentation, which was scribed in my presence. The recorded information has been reviewed and considered.  Rolland Porter, MD, Barbette Or, MD 01/15/16 [phone]

## 2016-01-15 ENCOUNTER — Emergency Department (HOSPITAL_COMMUNITY): Payer: Medicare Other | Admitting: Anesthesiology

## 2016-01-15 ENCOUNTER — Emergency Department (HOSPITAL_COMMUNITY): Payer: Medicare Other

## 2016-01-15 ENCOUNTER — Encounter (HOSPITAL_COMMUNITY)
Admission: EM | Disposition: A | Discharge status: Home / Self Care | Payer: Self-pay | Source: Home / Self Care | Attending: Orthopedic Surgery

## 2016-01-15 ENCOUNTER — Encounter (HOSPITAL_COMMUNITY): Payer: Self-pay | Admitting: Anesthesiology

## 2016-01-15 DIAGNOSIS — Z823 Family history of stroke: Secondary | ICD-10-CM | POA: Diagnosis not present

## 2016-01-15 DIAGNOSIS — Z96649 Presence of unspecified artificial hip joint: Secondary | ICD-10-CM

## 2016-01-15 DIAGNOSIS — Y92009 Unspecified place in unspecified non-institutional (private) residence as the place of occurrence of the external cause: Secondary | ICD-10-CM | POA: Diagnosis not present

## 2016-01-15 DIAGNOSIS — Z833 Family history of diabetes mellitus: Secondary | ICD-10-CM | POA: Diagnosis not present

## 2016-01-15 DIAGNOSIS — J449 Chronic obstructive pulmonary disease, unspecified: Secondary | ICD-10-CM | POA: Diagnosis not present

## 2016-01-15 DIAGNOSIS — Z85038 Personal history of other malignant neoplasm of large intestine: Secondary | ICD-10-CM | POA: Diagnosis not present

## 2016-01-15 DIAGNOSIS — Z87891 Personal history of nicotine dependence: Secondary | ICD-10-CM | POA: Diagnosis not present

## 2016-01-15 DIAGNOSIS — T84029A Dislocation of unspecified internal joint prosthesis, initial encounter: Secondary | ICD-10-CM

## 2016-01-15 DIAGNOSIS — M25552 Pain in left hip: Secondary | ICD-10-CM | POA: Diagnosis present

## 2016-01-15 DIAGNOSIS — Z794 Long term (current) use of insulin: Secondary | ICD-10-CM | POA: Diagnosis not present

## 2016-01-15 DIAGNOSIS — T84021A Dislocation of internal left hip prosthesis, initial encounter: Secondary | ICD-10-CM | POA: Diagnosis not present

## 2016-01-15 DIAGNOSIS — T84498A Other mechanical complication of other internal orthopedic devices, implants and grafts, initial encounter: Secondary | ICD-10-CM | POA: Insufficient documentation

## 2016-01-15 DIAGNOSIS — I10 Essential (primary) hypertension: Secondary | ICD-10-CM | POA: Diagnosis not present

## 2016-01-15 DIAGNOSIS — E119 Type 2 diabetes mellitus without complications: Secondary | ICD-10-CM | POA: Diagnosis not present

## 2016-01-15 DIAGNOSIS — Y792 Prosthetic and other implants, materials and accessory orthopedic devices associated with adverse incidents: Secondary | ICD-10-CM | POA: Diagnosis present

## 2016-01-15 HISTORY — PX: HIP CLOSED REDUCTION: SHX983

## 2016-01-15 LAB — GLUCOSE, CAPILLARY
GLUCOSE-CAPILLARY: 147 mg/dL — AB (ref 65–99)
GLUCOSE-CAPILLARY: 154 mg/dL — AB (ref 65–99)
GLUCOSE-CAPILLARY: 76 mg/dL (ref 65–99)
GLUCOSE-CAPILLARY: 97 mg/dL (ref 65–99)
Glucose-Capillary: 125 mg/dL — ABNORMAL HIGH (ref 65–99)

## 2016-01-15 LAB — I-STAT CHEM 8, ED
BUN: 19 mg/dL (ref 6–20)
CREATININE: 1.1 mg/dL — AB (ref 0.44–1.00)
Calcium, Ion: 1.16 mmol/L (ref 1.13–1.30)
Chloride: 97 mmol/L — ABNORMAL LOW (ref 101–111)
GLUCOSE: 150 mg/dL — AB (ref 65–99)
HEMATOCRIT: 36 % (ref 36.0–46.0)
HEMOGLOBIN: 12.2 g/dL (ref 12.0–15.0)
POTASSIUM: 3.5 mmol/L (ref 3.5–5.1)
Sodium: 137 mmol/L (ref 135–145)
TCO2: 26 mmol/L (ref 0–100)

## 2016-01-15 LAB — CBC WITH DIFFERENTIAL/PLATELET
Basophils Absolute: 0 10*3/uL (ref 0.0–0.1)
Basophils Relative: 0 %
Eosinophils Absolute: 0.3 10*3/uL (ref 0.0–0.7)
Eosinophils Relative: 3 %
HEMATOCRIT: 34 % — AB (ref 36.0–46.0)
HEMOGLOBIN: 11.1 g/dL — AB (ref 12.0–15.0)
LYMPHS PCT: 30 %
Lymphs Abs: 3 10*3/uL (ref 0.7–4.0)
MCH: 29.7 pg (ref 26.0–34.0)
MCHC: 32.6 g/dL (ref 30.0–36.0)
MCV: 90.9 fL (ref 78.0–100.0)
MONO ABS: 0.6 10*3/uL (ref 0.1–1.0)
MONOS PCT: 6 %
NEUTROS ABS: 6 10*3/uL (ref 1.7–7.7)
NEUTROS PCT: 61 %
Platelets: 345 10*3/uL (ref 150–400)
RBC: 3.74 MIL/uL — ABNORMAL LOW (ref 3.87–5.11)
RDW: 13.7 % (ref 11.5–15.5)
WBC: 9.9 10*3/uL (ref 4.0–10.5)

## 2016-01-15 LAB — BASIC METABOLIC PANEL
ANION GAP: 8 (ref 5–15)
BUN: 19 mg/dL (ref 6–20)
CALCIUM: 9.2 mg/dL (ref 8.9–10.3)
CHLORIDE: 101 mmol/L (ref 101–111)
CO2: 28 mmol/L (ref 22–32)
CREATININE: 1.05 mg/dL — AB (ref 0.44–1.00)
GFR calc Af Amer: >60 mL/min (ref >60)
GFR calc non Af Amer: 55 mL/min — ABNORMAL LOW (ref >60)
GLUCOSE: 161 mg/dL — AB (ref 65–99)
Potassium: 3.4 mmol/L — ABNORMAL LOW (ref 3.5–5.1)
Sodium: 137 mmol/L (ref 135–145)

## 2016-01-15 SURGERY — CLOSED REDUCTION, HIP
Anesthesia: General | Site: Hip | Laterality: Left

## 2016-01-15 MED ORDER — EPHEDRINE SULFATE 50 MG/ML IJ SOLN
INTRAMUSCULAR | Status: AC
  Filled 2016-01-15: qty 1

## 2016-01-15 MED ORDER — INSULIN NPH (HUMAN) (ISOPHANE) 100 UNIT/ML ~~LOC~~ SUSP
28.0000 [IU] | Freq: Every day | SUBCUTANEOUS | Status: DC
Start: 2016-01-15 — End: 2016-01-16
  Administered 2016-01-15 – 2016-01-16 (×2): 28 [IU] via SUBCUTANEOUS
  Filled 2016-01-15: qty 10

## 2016-01-15 MED ORDER — SODIUM CHLORIDE 0.9 % IJ SOLN
INTRAMUSCULAR | Status: AC
  Filled 2016-01-15: qty 10

## 2016-01-15 MED ORDER — ENOXAPARIN SODIUM 40 MG/0.4ML ~~LOC~~ SOLN
40.0000 mg | SUBCUTANEOUS | Status: AC
  Administered 2016-01-15: 40 mg via SUBCUTANEOUS
  Filled 2016-01-15: qty 0.4

## 2016-01-15 MED ORDER — MIDAZOLAM HCL 5 MG/5ML IJ SOLN
INTRAMUSCULAR | Status: DC | PRN
  Administered 2016-01-15: 2 mg via INTRAVENOUS

## 2016-01-15 MED ORDER — ASPIRIN EC 81 MG PO TBEC
81.0000 mg | DELAYED_RELEASE_TABLET | Freq: Every day | ORAL | Status: DC
  Administered 2016-01-15 – 2016-01-16 (×2): 81 mg via ORAL
  Filled 2016-01-15 (×2): qty 1

## 2016-01-15 MED ORDER — INSULIN NPH (HUMAN) (ISOPHANE) 100 UNIT/ML ~~LOC~~ SUSP: 12.0000 [IU] | Freq: Every day | SUBCUTANEOUS | Status: DC

## 2016-01-15 MED ORDER — MIDAZOLAM HCL 2 MG/2ML IJ SOLN
INTRAMUSCULAR | Status: AC
  Filled 2016-01-15: qty 2

## 2016-01-15 MED ORDER — PROPOFOL 10 MG/ML IV BOLUS
200.0000 mg | Freq: Once | INTRAVENOUS | Status: AC
  Administered 2016-01-15: 70 mg via INTRAVENOUS
  Filled 2016-01-15: qty 20

## 2016-01-15 MED ORDER — ONDANSETRON HCL 4 MG/2ML IJ SOLN: 4.0000 mg | Freq: Four times a day (QID) | INTRAMUSCULAR | Status: DC | PRN

## 2016-01-15 MED ORDER — LISINOPRIL 10 MG PO TABS
10.0000 mg | ORAL_TABLET | Freq: Every evening | ORAL | Status: DC
  Administered 2016-01-15: 10 mg via ORAL
  Filled 2016-01-15: qty 1

## 2016-01-15 MED ORDER — FENTANYL CITRATE (PF) 100 MCG/2ML IJ SOLN
INTRAMUSCULAR | Status: AC
  Filled 2016-01-15: qty 2

## 2016-01-15 MED ORDER — ONDANSETRON HCL 4 MG/2ML IJ SOLN
INTRAMUSCULAR | Status: DC | PRN
  Administered 2016-01-15: 4 mg via INTRAVENOUS

## 2016-01-15 MED ORDER — LIDOCAINE HCL (PF) 1 % IJ SOLN
INTRAMUSCULAR | Status: AC
  Filled 2016-01-15: qty 5

## 2016-01-15 MED ORDER — SUCCINYLCHOLINE CHLORIDE 20 MG/ML IJ SOLN
INTRAMUSCULAR | Status: AC
  Filled 2016-01-15: qty 1

## 2016-01-15 MED ORDER — LIDOCAINE HCL (CARDIAC) 20 MG/ML IV SOLN
INTRAVENOUS | Status: DC | PRN
  Administered 2016-01-15: 50 mg via INTRAVENOUS

## 2016-01-15 MED ORDER — ALBUTEROL SULFATE HFA 108 (90 BASE) MCG/ACT IN AERS: 2.0000 | INHALATION_SPRAY | RESPIRATORY_TRACT | Status: DC | PRN

## 2016-01-15 MED ORDER — SUCCINYLCHOLINE CHLORIDE 20 MG/ML IJ SOLN
INTRAMUSCULAR | Status: DC | PRN
  Administered 2016-01-15: 120 mg via INTRAVENOUS

## 2016-01-15 MED ORDER — PROPOFOL 10 MG/ML IV BOLUS
INTRAVENOUS | Status: AC
  Filled 2016-01-15: qty 40

## 2016-01-15 MED ORDER — INSULIN ASPART 100 UNIT/ML ~~LOC~~ SOLN
15.0000 [IU] | Freq: Two times a day (BID) | SUBCUTANEOUS | Status: DC
  Administered 2016-01-15 – 2016-01-16 (×2): 15 [IU] via SUBCUTANEOUS

## 2016-01-15 MED ORDER — SODIUM CHLORIDE 0.9 % IV SOLN
INTRAVENOUS | Status: DC | PRN
  Administered 2016-01-15: 03:00:00 via INTRAVENOUS

## 2016-01-15 MED ORDER — CALCIUM CARBONATE-VITAMIN D 500-200 MG-UNIT PO TABS
1.0000 | ORAL_TABLET | Freq: Every day | ORAL | Status: DC
  Administered 2016-01-15 – 2016-01-16 (×2): 1 via ORAL
  Filled 2016-01-15 (×2): qty 1

## 2016-01-15 MED ORDER — ALBUTEROL SULFATE (2.5 MG/3ML) 0.083% IN NEBU: 3.0000 mL | INHALATION_SOLUTION | Freq: Four times a day (QID) | RESPIRATORY_TRACT | Status: DC | PRN

## 2016-01-15 MED ORDER — FENTANYL CITRATE (PF) 100 MCG/2ML IJ SOLN
INTRAMUSCULAR | Status: DC | PRN
  Administered 2016-01-15 (×2): 50 ug via INTRAVENOUS

## 2016-01-15 MED ORDER — ATENOLOL 25 MG PO TABS
50.0000 mg | ORAL_TABLET | Freq: Every day | ORAL | Status: DC
  Administered 2016-01-15 – 2016-01-16 (×2): 50 mg via ORAL
  Filled 2016-01-15 (×2): qty 2

## 2016-01-15 MED ORDER — PROPOFOL 10 MG/ML IV BOLUS
INTRAVENOUS | Status: DC | PRN
  Administered 2016-01-15: 150 mg via INTRAVENOUS

## 2016-01-15 MED ORDER — VERAPAMIL HCL ER 240 MG PO TBCR
240.0000 mg | EXTENDED_RELEASE_TABLET | Freq: Every morning | ORAL | Status: DC
  Administered 2016-01-15 – 2016-01-16 (×2): 240 mg via ORAL
  Filled 2016-01-15 (×2): qty 1

## 2016-01-15 MED ORDER — ALBUTEROL SULFATE HFA 108 (90 BASE) MCG/ACT IN AERS
INHALATION_SPRAY | RESPIRATORY_TRACT | Status: DC | PRN
  Administered 2016-01-15: 4 via RESPIRATORY_TRACT

## 2016-01-15 MED ORDER — GLYCOPYRROLATE 0.2 MG/ML IJ SOLN
INTRAMUSCULAR | Status: AC
  Filled 2016-01-15: qty 1

## 2016-01-15 MED ORDER — SODIUM CHLORIDE 0.9 % IV SOLN
INTRAVENOUS | Status: DC
  Administered 2016-01-15: 08:00:00 via INTRAVENOUS

## 2016-01-15 MED ORDER — HYDROMORPHONE HCL 1 MG/ML IJ SOLN
1.0000 mg | Freq: Once | INTRAMUSCULAR | Status: AC
  Administered 2016-01-15: 1 mg via INTRAVENOUS
  Filled 2016-01-15: qty 1

## 2016-01-15 MED ORDER — HYDROMORPHONE HCL 1 MG/ML IJ SOLN
0.5000 mg | INTRAMUSCULAR | Status: DC | PRN
  Administered 2016-01-15: 1 mg via INTRAVENOUS
  Filled 2016-01-15: qty 1

## 2016-01-15 MED ORDER — TRIAMTERENE-HCTZ 37.5-25 MG PO TABS
1.0000 | ORAL_TABLET | Freq: Every morning | ORAL | Status: DC
  Administered 2016-01-15 – 2016-01-16 (×2): 1 via ORAL
  Filled 2016-01-15 (×2): qty 1

## 2016-01-15 SURGICAL SUPPLY — 3 items
KIT ROOM TURNOVER APOR (KITS) ×3 IMPLANT
PAD ARMBOARD 7.5X6 YLW CONV (MISCELLANEOUS) ×3 IMPLANT
PILLOW HIP ABDUCTION MED (ORTHOPEDIC SUPPLIES) ×3 IMPLANT

## 2016-01-15 NOTE — Op Note (Signed)
01/15/2016  3:36 AM  History given to me by her son; Mrs [name]'s son informed me that chair she sat in for both dislocations is low and she sits in it sideways with the hip flexed ~ 20 degrees and the leg externally rotated   She sits in the car gets in and out both passenger and driver side without problem   PATIENT:  [name]  65 y.o. female  PRE-OPERATIVE DIAGNOSIS:  left total hip dislocation  POST-OPERATIVE DIAGNOSIS:  left total hip dislocation  PROCEDURE:  Procedure(s): CLOSED REDUCTION LEFT HIP PROSTHESIS (Left)  Details of procedure  After appropriate evaluation in emergency room the patient was brought to the operating room the surgical site marked.  After general anesthesia the hip was flexed and straight traction was used to reduce the prosthesis. This took 2 attempts and was marked by palpable and audible clunk back into place.  Leg lengths became equal again and limb alignment normal eyes  I then took the hip to her extreme ranges of motion in abduction adduction sleep position flexion internal and external rotation extension external rotation in the supine position. I flexed the hip and externally and internally rotated as well. I could not dislocate the hip  I did this twice  An abduction pillow was placed  The patient was extubated taken recovery room in stable condition SURGEON:  Surgeon(s) and Role:    * Carole Civil, MD - Primary  PHYSICIAN ASSISTANT:   ASSISTANTS: none   ANESTHESIA:   general  EBL:     BLOOD ADMINISTERED:none  DRAINS: none   LOCAL MEDICATIONS USED:  NONE  SPECIMEN:  No Specimen  DISPOSITION OF SPECIMEN:  N/A  COUNTS:  YES  TOURNIQUET:  * No tourniquets in log *  DICTATION: .Dragon Dictation  PLAN OF CARE: Admit to inpatient   PATIENT DISPOSITION:  PACU - hemodynamically stable.   Delay start of Pharmacological VTE agent (>24hrs) due to surgical blood loss or risk of bleeding: not  applicable  Prognosis: The patient will probably need revision surgery. She now has a weak anterior capsule. She  she may need cup revision. Although, it is unusual that under anesthesia I cannot get the hip to reduce her subluxate. Prognosis is guarded.

## 2016-01-15 NOTE — Consult Note (Addendum)
Reason for Consult: Left total hip arthroplasty dislocation Referring Physician: Damilola Boutin is an 65 y.o. female.  HPI: The patient had a left total hip direct lateral approach on March 22 with a Depuy press-fit stem press-fit cup with one screw. She did well until last week when she sat down and reached to plug in a phone and her hip popped out. She had closed reduction was sent home did well saw her in the office on April 25 and she has stable hip with no evidence of dislocation or subluxation.  She complains of severe left hip pain times several hours and she came to the ER somewhere around 10 PM.  She underwent attempted closed reduction using 70 mg of propofol but post reduction film showed no reduction.  At that point based on her COPD I felt that it was unsafe to try more IV sedation without control of the airway  Past Medical History  Diagnosis Date  . Diabetes mellitus     Insulin-dependent diabetes  . Hypertension   . Cancer ([place])     ovarian  . COPD (chronic obstructive pulmonary disease) ([place])   . Arthritis     Past Surgical History  Procedure Laterality Date  . Replacement total knee      bilateral  . Cholecystectomy    . Colonoscopy  2007    Dr. Gala Romney: internal hemorrhoids, few scattered sigmoid diverticula  . Esophagogastroduodenoscopy  2008    Dr. Gala Romney: erosive esophagitis, patulous EG junction  . Colonoscopy N/A 04/09/2014    [id number] diverticulosis. Redundant colon. Single colonic polyp-removed as described above.  . Esophagogastroduodenoscopy N/A 04/09/2014    [id number] reflux esophagitis. Schatzki's ring-status post dilation as described above. Hiatal hernia. Gastricerosions-status post gastric biopsy  . Savory dilation N/A 04/09/2014    Procedure: SAVORY DILATION;  Surgeon: Daneil Dolin, MD;  [facility];  Service: Endoscopy;  Laterality: N/A;  Venia Minks dilation N/A 04/09/2014    Procedure: Venia Minks DILATION;  Surgeon: Daneil Dolin, MD;  [facility];  Service: Endoscopy;  Laterality: N/A;  . Abdominal hysterectomy      BSO  . Abdominal surgery      for bleeding after hysterectomy  . Left hip replacement  12/08/2015  . Total hip arthroplasty Left 12/08/2015    Procedure: TOTAL HIP ARTHROPLASTY;  Surgeon: Carole Civil, MD;  Location: [facility];  Service: Orthopedics;  Laterality: Left;    Family History  Problem Relation Age of Onset  . Colon cancer Neg Hx   . Stroke Mother   . Diabetes Mother   . Diabetes Father     Social History:  reports that she quit smoking about 3 months ago. Her smoking use included Cigarettes. She has a 54 pack-year smoking history. She has never used smokeless tobacco. She reports that she does not drink alcohol or use illicit drugs.  Allergies:  Allergies  Allergen Reactions  . Gabapentin Other (See Comments)    suicidal thoughts    Medications: I have reviewed the patient's current medications.  No results found for this or any previous visit (from the past 48 hour(s)).  Dg Hip Port Unilat With Pelvis 1v Right  01/15/2016  CLINICAL DATA:  Attempted reduction of left hip prosthesis dislocation. Initial encounter. EXAM: DG HIP (WITH OR WITHOUT PELVIS) 1V PORT RIGHT COMPARISON:  Left hip radiographs performed 01/14/2016 FINDINGS: There is persistent superior dislocation of the patient's left hip prosthesis. There is no evidence of  fracture. The left sacroiliac joint is grossly unremarkable. Scattered vascular calcifications are seen. IMPRESSION: Persistent superior dislocation of the left hip prosthesis. No evidence of fracture. Electronically Signed   By: Garald Balding M.D.   On: 01/15/2016 01:47   Dg Hip Unilat With Pelvis 2-3 Views Left  01/14/2016  CLINICAL DATA:  65 year old female with history of left-sided hip pain after sitting down this evening. Status post hip replacement on 12/08/2015. Prior hip dislocation. EXAM: DG HIP (WITH OR WITHOUT PELVIS) 2-3V  LEFT COMPARISON:  Multiple priors, most recently 01/06/2016. FINDINGS: As noted on prior exam 01/06/2016 there is again superior and lateral dislocation of the prosthetic femoral head from the left prosthetic acetabulum. On the lateral projection, the femoral head is mildly anteriorly displaced. No periprosthetic fracture adjacent to either the femoral stem or the acetabular cup. Bony pelvis appears intact. Right proximal femur appears intact as visualized. Numerous vascular calcifications. Numerous surgical clips are noted throughout the anatomic pelvis. IMPRESSION: 1. Left prosthetic hip dislocation, as above. 2. Negative for fracture. Electronically Signed   By: Vinnie Langton M.D.   On: 01/14/2016 23:27    Review of Systems  Constitutional: Negative for fever, chills, weight loss and malaise/fatigue.  HENT: Positive for congestion.   Eyes: Negative for pain and discharge.  Respiratory: Negative for hemoptysis.   Cardiovascular: Negative for palpitations and leg swelling.  Gastrointestinal: Negative for heartburn.  Genitourinary: Negative for dysuria.  Musculoskeletal: Negative for myalgias.  Skin: Negative for rash.  Neurological: Negative for tingling, focal weakness and headaches.  Endo/Heme/Allergies: Negative for environmental allergies and polydipsia. Does not bruise/bleed easily.  Psychiatric/Behavioral: Negative for memory loss.   Blood pressure 141/69, pulse 64, temperature 98.2 F (36.8 C), temperature source Oral, resp. rate 25, height 5\' 5"  (1.651 m), weight 260 lb (117.935 kg), SpO2 100 %. Physical Exam  Constitutional: She is oriented to person, place, and time. She appears well-developed and well-nourished. She appears distressed.  HENT:  Head: Normocephalic.  Eyes: Pupils are equal, round, and reactive to light.  Neck: Normal range of motion. Neck supple. No JVD present. No tracheal deviation present.  Cardiovascular: Normal rate and intact distal pulses.   Respiratory:  Effort normal. No stridor. No respiratory distress. She has no wheezes.  GI: Soft. She exhibits no distension.  Musculoskeletal:  Upper extremities normal alignment without contracture subluxation atrophy or tremor. Normal strength. Normal range of motion  Right lower extremity normal alignment no contracture, subluxation atrophy or tremor normal strength normal range of motion  Left lower extremity short tenderness in the proximal femur hip, decreased range of motion normal muscle tone dislocated hip  Lymphadenopathy:    She has no cervical adenopathy.  Neurological: She is alert and oriented to person, place, and time. She displays normal reflexes. No cranial nerve deficit. She exhibits normal muscle tone. Coordination normal.  Skin: Skin is warm and dry. She is not diaphoretic. No erythema. No pallor.  Psychiatric: She has a normal mood and affect. Her behavior is normal. Judgment and thought content normal.    Assessment/Plan : left total hip prosthesis second dislocation postoperatively  Attempted closed reduction failed in the ER  Patient to be aken to surgery for reduction under anesthesia left total hip prosthesis    Arther Abbott 01/15/2016, 2:04 AM

## 2016-01-15 NOTE — Evaluation (Signed)
Physical Therapy Evaluation Patient Details Name: [name] MRN: [id number] DOB: Oct 21, 1950 Today's Date: 01/15/2016   History of Present Illness  Tyrea Nabarro is a 65yo black female who presents after closed hip reduction under anesthesia. The pt had a L THA on 12/08/15 and DC to [facility] for 3 weeks. At home pt sustained a dislocation on 4/20 reduced in the ED, and another on 4/25 at home, shortly after followup visit with ortho. Prior to dislocations, pt was ambulating limited community distances with SPC, but had not began OPPT yet. PMH: bilat TKA.   Clinical Impression  Pt sleeping upon entry, O2 donned, easily awakened. Pt pain free at rest with wedge pillow, however with 6/10 pain with all mobility. Pt demonstrating difficulty progressing the LLE during all mobility, and requires some physical assistance with the limb during bed mobility; severe trunk weakness is also very limiting during supine to/from sitting. Once standing, patient appears stable and safe, and demonstrates safe precautions during turns in hallway. Pt demonstrating impairment of strength, balance, O2 perfusion, and activity tolerance, limiting indep in ADL and functional mobility within the home and community. Pt will benefit from skilled PT intervention to address the above deficits in order to restore patient to PLOF and improve safety for return to home.        Follow Up Recommendations Outpatient PT (Physician to detemine whether additional surgical intervention is necessary. )    Equipment Recommendations  None recommended by PT    Recommendations for Other Services       Precautions / Restrictions Precautions Precautions: Posterior Hip;Anterior Hip Precaution Comments: Wedge pillow at all times unless AMB.       Mobility  Bed Mobility Overal bed mobility: +2 for physical assistance;Needs Assistance Bed Mobility: Supine to Sit;Sit to Supine     Supine to sit: Max assist;+2 for physical  assistance Sit to supine: Mod assist   General bed mobility comments: Severe trunk weakness and difficulty moving LLE.   Transfers Overall transfer level: Needs assistance Equipment used: Rolling walker (2 wheeled) Transfers: Sit to/from Stand Sit to Stand: From elevated surface;Supervision            Ambulation/Gait Ambulation/Gait assistance: Modified independent (Device/Increase time) Ambulation Distance (Feet): 200 Feet Assistive device: Rolling walker (2 wheeled) Gait Pattern/deviations: Step-through pattern;Wide base of support;Antalgic     General Gait Details: 0.41m/s  Stairs            Wheelchair Mobility    Modified Rankin (Stroke Patients Only)       Balance Overall balance assessment: Modified Independent                                           Pertinent Vitals/Pain Pain Assessment: 0-10 Pain Score: 6  Pain Location: Left  hip  Pain Descriptors / Indicators: Aching Pain Intervention(s): Limited activity within patient's tolerance;Monitored during session    Home Living Family/patient expects to be discharged to:: Private residence Living Arrangements: Alone                    Prior Function Level of Independence: Independent with assistive device(s)         Comments: ambulates with cane or walker     Hand Dominance        Extremity/Trunk Assessment   Upper Extremity Assessment: Generalized weakness  Lower Extremity Assessment: Generalized weakness         Communication   Communication: No difficulties  Cognition Arousal/Alertness: Awake/alert Behavior During Therapy: WFL for tasks assessed/performed Overall Cognitive Status: Within Functional Limits for tasks assessed       Memory: Decreased recall of precautions              General Comments      Exercises        Assessment/Plan    PT Assessment Patient needs continued PT services  PT Diagnosis Difficulty  walking;Generalized weakness;Acute pain;Abnormality of gait   PT Problem List Decreased strength;Decreased mobility;Decreased range of motion;Pain  PT Treatment Interventions Gait training;Functional mobility training;Therapeutic activities;Therapeutic exercise;Balance training   PT Goals (Current goals can be found in the Care Plan section) Acute Rehab PT Goals Patient Stated Goal: Improve functional mobility. Decrease pain.  PT Goal Formulation: With patient Time For Goal Achievement: 01/29/16 Potential to Achieve Goals: Fair    Frequency 7X/week   Barriers to discharge Decreased caregiver support      Co-evaluation               End of Session Equipment Utilized During Treatment: Gait belt Activity Tolerance: Patient tolerated treatment well;Patient limited by pain Patient left: in bed;with call bell/phone within reach;Other (comment) (wedge pillow. ) Nurse Communication: Mobility status         [id number] PT Time Calculation (min) (ACUTE ONLY): 33 min   Charges:   PT Evaluation $PT Eval Low Complexity: 1 Procedure PT Treatments $Therapeutic Activity: 8-22 mins   PT G Codes:        9:23 PM, Jan 16, 2016 Etta Grandchild, PT, DPT PRN Physical Therapist - [place] License # [id number] ([phone] (mobile)

## 2016-01-15 NOTE — Brief Op Note (Addendum)
01/15/2016  3:36 AM  PATIENT:  [name]  65 y.o. female  PRE-OPERATIVE DIAGNOSIS:  left total hip dislocation  POST-OPERATIVE DIAGNOSIS:  left total hip dislocation  PROCEDURE:  Procedure(s): CLOSED REDUCTION LEFT HIP PROSTHESIS (Left)  SURGEON:  Surgeon(s) and Role:    * Carole Civil, MD - Primary  PHYSICIAN ASSISTANT:   ASSISTANTS: none   ANESTHESIA:   general  EBL:     BLOOD ADMINISTERED:none  DRAINS: none   LOCAL MEDICATIONS USED:  NONE  SPECIMEN:  No Specimen  DISPOSITION OF SPECIMEN:  N/A  COUNTS:  YES  TOURNIQUET:  * No tourniquets in log *  DICTATION: .Dragon Dictation  PLAN OF CARE: Admit to inpatient   PATIENT DISPOSITION:  PACU - hemodynamically stable.   Delay start of Pharmacological VTE agent (>24hrs) due to surgical blood loss or risk of bleeding: not applicable

## 2016-01-15 NOTE — Anesthesia Postprocedure Evaluation (Signed)
Anesthesia Post Note  Patient: [name]  Procedure(s) Performed: Procedure(s) (LRB): CLOSED REDUCTION LEFT HIP PROSTHESIS (Left)  Patient location during evaluation: PACU Anesthesia Type: General Level of consciousness: awake and alert and oriented Pain management: pain level controlled Vital Signs Assessment: post-procedure vital signs reviewed and stable Respiratory status: spontaneous breathing and respiratory function stable Cardiovascular status: stable Postop Assessment: no signs of nausea or vomiting Anesthetic complications: no    Last Vitals:  Filed Vitals:   01/15/16 0230 01/15/16 0340  BP: 170/85   Pulse: 66   Temp:  37 C  Resp: 16     Last Pain:  Filed Vitals:   01/15/16 0350  PainSc: 0-No pain                 Marlane Hirschmann A

## 2016-01-15 NOTE — Progress Notes (Signed)
Patient ID: [name], female   DOB: November 09, 1950, 65 y.o.   MRN: [id number]  Postop day 1 status post close reduction of prosthetic dislocation left total hip  Patient is awake and alert  BP 118/52 mmHg  Pulse 72  Temp(Src) 98.4 F (36.9 C) (Oral)  Resp 18  Ht 5\' 5"  (1.651 m)  Wt 260 lb (117.935 kg)  BMI 43.27 kg/m2  SpO2 97%  She has normal dorsiflexion and plantar flexion of the foot limb alignment is normal  The hip was stable under anesthesia once reduced.  We've advised the patient to remove the chair that she is sitting in at home that she has dislocated her hip twice while trying to sit in it.  She's been able to sit in a car regular chair go to the bathroom on a high toilet seat without any problems.  The plan is for her to get up with physical therapy if she can move well walk well without any issues that she can go home and we will plan revision surgery in 2 weeks  She is to sleep with abduction pillow in between her legs at all times and avoid low sitting chairs.

## 2016-01-15 NOTE — Anesthesia Preprocedure Evaluation (Addendum)
Anesthesia Evaluation  Patient identified by MRN, date of birth, ID band Patient awake    Reviewed: Allergy & Precautions, NPO status , Patient's Chart, lab work & pertinent test results  Airway        Dental  (+) Edentulous Upper, Edentulous Lower   Pulmonary COPD,  COPD inhaler, former smoker,   Slight wheezing noted; Albuterol 4 puffs given   + wheezing      Cardiovascular Exercise Tolerance: Good hypertension, Pt. on medications  Rhythm:regular Rate:Normal     Neuro/Psych    GI/Hepatic GERD  ,  Endo/Other  diabetes  Renal/GU      Musculoskeletal  (+) Arthritis ,   Abdominal   Peds  Hematology   Anesthesia Other Findings   Reproductive/Obstetrics                            Anesthesia Physical Anesthesia Plan  ASA: III and emergent  Anesthesia Plan: General   Post-op Pain Management: GA combined w/ Regional for post-op pain   Induction: Intravenous, Rapid sequence and Cricoid pressure planned  Airway Management Planned: Oral ETT  Additional Equipment:   Intra-op Plan:   Post-operative Plan:   Informed Consent:   Dental Advisory Given  Plan Discussed with: CRNA and Surgeon  Anesthesia Plan Comments:        Anesthesia Quick Evaluation

## 2016-01-15 NOTE — Anesthesia Procedure Notes (Signed)
Procedure Name: Intubation Date/Time: 01/15/2016 3:16 AM Performed by: Andree Elk, AMY A Pre-anesthesia Checklist: Patient identified, Patient being monitored, Timeout performed, Emergency Drugs available and Suction available Patient Re-evaluated:Patient Re-evaluated prior to inductionOxygen Delivery Method: Circle System Utilized Preoxygenation: Pre-oxygenation with 100% oxygen Intubation Type: IV induction, Rapid sequence and Cricoid Pressure applied Ventilation: Mask ventilation without difficulty Laryngoscope Size: 3 and Miller Grade View: Grade I Tube type: Oral Tube size: 7.0 mm Number of attempts: 1 Airway Equipment and Method: Stylet Placement Confirmation: ETT inserted through vocal cords under direct vision,  positive ETCO2 and breath sounds checked- equal and bilateral Secured at: 21 cm Tube secured with: Tape Dental Injury: Teeth and Oropharynx as per pre-operative assessment

## 2016-01-15 NOTE — Transfer of Care (Signed)
Immediate Anesthesia Transfer of Care Note  Patient: [name]  Procedure(s) Performed: Procedure(s): CLOSED REDUCTION LEFT HIP PROSTHESIS (Left)  Patient Location: PACU  Anesthesia Type:General  Level of Consciousness: awake, alert , oriented and patient cooperative  Airway & Oxygen Therapy: Patient Spontanous Breathing and Patient connected to face mask oxygen  Post-op Assessment: Report given to RN and Post -op Vital signs reviewed and stable  Post vital signs: Reviewed and stable  Last Vitals:  Filed Vitals:   01/15/16 0230 01/15/16 0340  BP: 170/85   Pulse: 66   Temp:  37 C  Resp: 16     Last Pain:  Filed Vitals:   01/15/16 0343  PainSc: 9          Complications: No apparent anesthesia complications

## 2016-01-15 NOTE — ED Notes (Signed)
Removed patient's dentures and placed in denture cup and gave to son with patient's clothing.

## 2016-01-16 LAB — GLUCOSE, CAPILLARY
GLUCOSE-CAPILLARY: 163 mg/dL — AB (ref 65–99)
GLUCOSE-CAPILLARY: 65 mg/dL (ref 65–99)

## 2016-01-16 MED ORDER — HYDROCODONE-ACETAMINOPHEN 7.5-325 MG PO TABS: 1.0000 | ORAL_TABLET | ORAL | Status: DC | PRN

## 2016-01-16 NOTE — Progress Notes (Signed)
Patient ID: [name], female   DOB: 1951-09-09, 65 y.o.   MRN: [id number] BP 149/68 mmHg  Pulse 65  Temp(Src) 98.4 F (36.9 C) (Oral)  Resp 18  Ht 5\' 5"  (1.651 m)  Wt 260 lb (117.935 kg)  BMI 43.27 kg/m2  SpO2 90%  Much more alert and awake today  Postop day 2 closed reduction of prosthetic dislocation left total hip  Patient advised on hip precautions and squaring up in her seat when she sits down  I plan to take her to surgery for anterior capsular repair and possible revision of acetabular cup on May 10  She can discharged today with an abduction pillow at night

## 2016-01-16 NOTE — Progress Notes (Signed)
Physical Therapy Treatment Patient Details Name: [name] MRN: [id number] DOB: 1951/09/04 Today's Date: 01/16/2016    History of Present Illness [name] is a 65yo black female who presents after closed hip reduction under anesthesia. The pt had a L THA on 12/08/15 and DC to [facility] for 3 weeks. At home pt sustained a dislocation on 4/20 reduced in the ED, and another on 4/25 at home, shortly after followup visit with ortho. Prior to dislocations, pt was ambulating limited community distances with SPC, but had not began OPPT yet. PMH: bilat TKA.     PT Comments    Pt progressing well with mobility at this time. Pt has been up with nursing three times this morning, is able to describe hip precautions in detail, and has no pain during session, only some soreness. The patient is confident in her ability to return to home safely and will await revision surgery in May.   Follow Up Recommendations   (Dr Aline Brochure has DC all rehab at this time, with revision surgery planned for 2nd week in May. )     Equipment Recommendations  None recommended by PT    Recommendations for Other Services       Precautions / Restrictions Precautions Precautions: Posterior Hip;Anterior Hip Precaution Comments: Wedge pillow at all times unless AMB.     Mobility  Bed Mobility Overal bed mobility: Modified Independent Bed Mobility: Supine to Sit     Supine to sit: Supervision     General bed mobility comments: Severe trunk weakness and difficulty moving LLE. Encouraged to perform indep as if she were home alone, she does so with additional time and effort, and observance of precautions.   Transfers Overall transfer level: Modified independent Equipment used: Rolling walker (2 wheeled) Transfers: Sit to/from Stand Sit to Stand: From elevated surface;Supervision            Ambulation/Gait Ambulation/Gait assistance: Modified independent (Device/Increase time) Ambulation Distance (Feet):  260 Feet Assistive device: Rolling walker (2 wheeled)       General Gait Details: 0.35m/s, lots of rest breaks, worsening trendelenburg as gait progresses, encouraged to avoid excessive trunk rotation on a fixed LLE during gair cycle.    Stairs            Wheelchair Mobility    Modified Rankin (Stroke Patients Only)       Balance Overall balance assessment: Modified Independent                                  Cognition Arousal/Alertness: Awake/alert Behavior During Therapy: WFL for tasks assessed/performed Overall Cognitive Status: Within Functional Limits for tasks assessed                      Exercises      General Comments        Pertinent Vitals/Pain Pain Assessment: 0-10 Pain Score: 0-No pain Pain Location: some soreness in the posterior glutemax/med during gait, but no 'pain' Pain Descriptors / Indicators: Sore Pain Intervention(s): Limited activity within patient's tolerance;Monitored during session    Home Living                      Prior Function            PT Goals (current goals can now be found in the care plan section) Acute Rehab PT Goals Patient Stated Goal: Improve functional mobility. Decrease  pain.  PT Goal Formulation: With patient Time For Goal Achievement: 01/29/16 Potential to Achieve Goals: Good Progress towards PT goals: Progressing toward goals    Frequency  7X/week    PT Plan Current plan remains appropriate    Co-evaluation             End of Session Equipment Utilized During Treatment: Gait belt Activity Tolerance: Patient tolerated treatment well;Patient limited by pain Patient left: in bed;with call bell/phone within reach;Other (comment);with family/visitor present     Time: [id number] PT Time Calculation (min) (ACUTE ONLY): 23 min  Charges:  $Therapeutic Activity: 23-37 mins                    G Codes:      1:46 PM, Jan 23, 2016 Etta Grandchild, PT, DPT PRN Physical  Therapist - [place] License # [id number] [phone] (mobile)

## 2016-01-16 NOTE — Discharge Summary (Signed)
Physician Discharge Summary  Patient ID: CORNIE BOSCH MRN: [id number] DOB/AGE: Nov 05, 1950 65 y.o.  Admit date: 01/14/2016 Discharge date: 01/16/2016  Admission Diagnoses: Left hip periprosthetic dislocation  Discharge Diagnoses: Same Active Problems:   Mechanical complication of internal orthopedic device [facility])   Dislocation of hip prosthesis [facility])   Discharged Condition: good  Hospital Course: On 01/06/2016 patient admitted to the emergency room for dislocation of left hip. Closed reduction emergency room with propofol sedation was unsuccessful in reducing the hip. Patient was taken to surgery for reduction under general anesthesia which was successful.  Post reduction examination under anesthesia the hip was stable and did not dislocate in any position.  Patient was admitted for physical therapy  She tolerated that well on April 29. On the 30th her pain was under control she was ambulatory with therapy hip precautions were enforced and she was stable for discharge   Discharge Exam: Blood pressure 149/68, pulse 65, temperature 98.4 F (36.9 C), temperature source Oral, resp. rate 18, height 5\' 5"  (1.651 m), weight 260 lb (117.935 kg), SpO2 90 %. General appearance: alert, cooperative and no distress Head: Normocephalic, without obvious abnormality, atraumatic Extremities: extremities normal, atraumatic, no cyanosis or edema Pulses: 2+ and symmetric Skin: Skin color, texture, turgor normal. No rashes or lesions Neurologic: Grossly normal Incision/Wound: HEALED  Disposition: 01-Home or Self Care  Discharge Instructions    Call MD / Call 911    Complete by:  As directed   If you experience chest pain or shortness of breath, CALL 911 and be transported to the hospital emergency room.  If you develope a fever above 101 F, pus (white drainage) or increased drainage or redness at the wound, or calf pain, call your surgeon's office.     Constipation Prevention    Complete by:   As directed   Drink plenty of fluids.  Prune juice may be helpful.  You may use a stool softener, such as Colace (over the counter) 100 mg twice a day.  Use MiraLax (over the counter) for constipation as needed.     Diet - low sodium heart healthy    Complete by:  As directed      Discharge instructions    Complete by:  As directed   [place] [facility] [place]     Follow the hip precautions as taught in Physical Therapy    Complete by:  As directed      Increase activity slowly as tolerated    Complete by:  As directed             Medication List    STOP taking these medications        HYDROcodone-acetaminophen 5-325 MG tablet  Commonly known as:  NORCO/VICODIN  Replaced by:  HYDROcodone-acetaminophen 7.5-325 MG tablet      TAKE these medications        albuterol 1.25 MG/3ML nebulizer solution  Commonly known as:  ACCUNEB  Take 1 ampule by nebulization every 6 (six) hours as needed for wheezing or shortness of breath.     albuterol 108 (90 Base) MCG/ACT inhaler  Commonly known as:  PROVENTIL HFA;VENTOLIN HFA  Inhale 2 puffs into the lungs every 4 (four) hours as needed for wheezing or shortness of breath.     aspirin EC 81 MG tablet  Take 81 mg by mouth daily.     atenolol 50  MG tablet  Commonly known as:  TENORMIN  Take 50 mg by mouth daily.     calcium-vitamin D 500-200 MG-UNIT tablet  Commonly known as:  OSCAL WITH D  Take 1 tablet by mouth daily.     HYDROcodone-acetaminophen 7.5-325 MG tablet  Commonly known as:  NORCO  Take 1 tablet by mouth every 4 (four) hours as needed for moderate pain.     insulin NPH Human 100 UNIT/ML injection  Commonly known as:  HUMULIN N,NOVOLIN N  Inject 12-28 Units into the skin daily. 28 in the morning and 12 in the afternoon     insulin regular 100 units/mL injection  Commonly known as:  NOVOLIN R,HUMULIN R  Inject  15-25 Units into the skin 2 (two) times daily.     lisinopril 10 MG tablet  Commonly known as:  PRINIVIL,ZESTRIL  Take 10 mg by mouth every evening.     triamterene-hydrochlorothiazide 37.5-25 MG tablet  Commonly known as:  MAXZIDE-25  Take 1 tablet by mouth every morning.     verapamil 240 MG (CO) 24 hr tablet  Commonly known as:  COVERA HS  Take 240 mg by mouth every morning.     Vitamin D3 1000 units Caps  Take by mouth daily.           Follow-up Information    Follow up with Arther Abbott, MD.   Specialties:  Orthopedic Surgery, Radiology   Why:  The patient will be scheduled for surgery on May 10, no follow-up scheduled immediately post discharge   Contact information:   [address] [place] [address] [phone]       Signed: Arther Abbott 01/16/2016, 10:04 AM

## 2016-01-16 NOTE — Anesthesia Postprocedure Evaluation (Signed)
Anesthesia Post Note  Patient: [name]  Procedure(s) Performed: Procedure(s) (LRB): CLOSED REDUCTION LEFT HIP PROSTHESIS (Left)  Patient location during evaluation: Nursing Unit Anesthesia Type: General Level of consciousness: awake and alert and oriented Pain management: pain level controlled Vital Signs Assessment: post-procedure vital signs reviewed and stable Respiratory status: spontaneous breathing Cardiovascular status: stable Postop Assessment: no signs of nausea or vomiting Anesthetic complications: no    Last Vitals:  Filed Vitals:   01/15/16 2107 01/16/16 0518  BP: 125/54 149/68  Pulse: 63 65  Temp: 37.1 C 36.9 C  Resp: 18 18    Last Pain:  Filed Vitals:   01/16/16 0519  PainSc: Asleep                 Illyria Sobocinski A

## 2016-01-16 NOTE — Addendum Note (Signed)
Addendum  created 01/16/16 [id number] by Mickel Baas, CRNA   Modules edited: Clinical Notes   Clinical Notes:  File: [id number]

## 2016-01-16 NOTE — Progress Notes (Signed)
Patient with discharge orders. Discharge instructions given, patient verbalized understanding. Prescriptions given. Patient stable. Patient left in private vehicle with son.

## 2016-01-17 ENCOUNTER — Encounter (HOSPITAL_COMMUNITY): Payer: Self-pay | Admitting: Orthopedic Surgery

## 2016-01-17 ENCOUNTER — Telehealth (HOSPITAL_COMMUNITY): Payer: Self-pay | Admitting: Physical Therapy

## 2016-01-17 NOTE — Telephone Encounter (Signed)
Patient called she got out of the hospital yesterday, wants to cx this eval MD thinks she will need surgery. We will get a new order when they decided what to do. NF

## 2016-01-19 ENCOUNTER — Ambulatory Visit (HOSPITAL_COMMUNITY): Payer: Medicare Other | Admitting: Physical Therapy

## 2016-01-20 ENCOUNTER — Other Ambulatory Visit: Payer: Self-pay | Admitting: Orthopedic Surgery

## 2016-01-21 NOTE — Patient Instructions (Signed)
    [name]  01/21/2016     @PREFPERIOPPHARMACY @   Your procedure is scheduled on 01/26/2016.  Report to [facility] at 7:00 A.M.  Call this number if you have problems the morning of surgery:  [phone]   Remember:  Do not eat food or drink liquids after midnight.  Take these medicines the morning of surgery with A SIP OF WATER : NORCO, ATENOLOL, LISINOPRIL AND VERAPAMIL.  PLEASE TAKE YOUR ALBUTEROL INHALER AND NEBULIZER BEFORE LEAVING HOME AND BRING IT TO [place] WITH YOU.     Do not wear jewelry, make-up or nail polish.  Do not wear lotions, powders, or perfumes.  You may wear deodorant.  Do not shave 48 hours prior to surgery.  Men may shave face and neck.  Do not bring valuables to the hospital.  [facility] is not responsible for any belongings or valuables.  Contacts, dentures or bridgework may not be worn into surgery.  Leave your suitcase in the car.  After surgery it may be brought to your room.  For patients admitted to the hospital, discharge time will be determined by your treatment team.  Patients discharged the day of surgery will not be allowed to drive home.   Name and phone number of your driver:   FAMILY Special instructions:  N/A  Please read over the following fact sheets that you were given. Care and Recovery After Surgery    Incision Care An incision is when a surgeon cuts into your body. After surgery, the incision needs to be cared for properly to prevent infection.  HOW TO CARE FOR YOUR INCISION  Take medicines only as directed by your health care provider.  There are many different ways to close and cover an incision, including stitches, skin glue, and adhesive strips. Follow your health care provider's instructions on:  Incision care.  Bandage (dressing) changes and removal.  Incision closure removal.  Do not take baths, swim, or use a hot tub until your health care provider approves. You may shower as directed by your health  care provider.  Resume your normal diet and activities as directed.  Use anti-itch medicine (such as an antihistamine) as directed by your health care provider. The incision may itch while it is healing. Do not pick or scratch at the incision.  Drink enough fluid to keep your urine clear or pale yellow. SEEK MEDICAL CARE IF:   You have drainage, redness, swelling, or pain at your incision site.  You have muscle aches, chills, or a general ill feeling.  You notice a bad smell coming from the incision or dressing.  Your incision edges separate after the sutures, staples, or skin adhesive strips have been removed.  You have persistent nausea or vomiting.  You have a fever.  You are dizzy. SEEK IMMEDIATE MEDICAL CARE IF:   You have a rash.  You faint.  You have difficulty breathing. MAKE SURE YOU:   Understand these instructions.  Will watch your condition.  Will get help right away if you are not doing well or get worse.   This information is not intended to replace advice given to you by your health care provider. Make sure you discuss any questions you have with your health care provider.   Document Released: 03/24/2005 Document Revised: 09/25/2014 Document Reviewed: 10/29/2013 Elsevier Interactive Patient Education [employer].

## 2016-01-24 ENCOUNTER — Ambulatory Visit (HOSPITAL_COMMUNITY)
Admission: RE | Admit: 2016-01-24 | Discharge: 2016-01-24 | Disposition: A | Discharge status: Home / Self Care | Payer: Medicare Other | Source: Ambulatory Visit | Attending: Orthopedic Surgery | Admitting: Orthopedic Surgery

## 2016-01-24 ENCOUNTER — Encounter (HOSPITAL_COMMUNITY)
Admission: RE | Admit: 2016-01-24 | Discharge: 2016-01-24 | Disposition: A | Discharge status: Home / Self Care | Payer: Medicare Other | Source: Ambulatory Visit | Attending: Orthopedic Surgery | Admitting: Orthopedic Surgery

## 2016-01-24 ENCOUNTER — Encounter (HOSPITAL_COMMUNITY): Payer: Self-pay

## 2016-01-24 DIAGNOSIS — Z823 Family history of stroke: Secondary | ICD-10-CM | POA: Diagnosis not present

## 2016-01-24 DIAGNOSIS — Z01810 Encounter for preprocedural cardiovascular examination: Secondary | ICD-10-CM

## 2016-01-24 DIAGNOSIS — J449 Chronic obstructive pulmonary disease, unspecified: Secondary | ICD-10-CM | POA: Diagnosis not present

## 2016-01-24 DIAGNOSIS — K219 Gastro-esophageal reflux disease without esophagitis: Secondary | ICD-10-CM | POA: Diagnosis not present

## 2016-01-24 DIAGNOSIS — Z794 Long term (current) use of insulin: Secondary | ICD-10-CM | POA: Diagnosis not present

## 2016-01-24 DIAGNOSIS — T84021A Dislocation of internal left hip prosthesis, initial encounter: Secondary | ICD-10-CM

## 2016-01-24 DIAGNOSIS — I10 Essential (primary) hypertension: Secondary | ICD-10-CM | POA: Diagnosis not present

## 2016-01-24 DIAGNOSIS — Z833 Family history of diabetes mellitus: Secondary | ICD-10-CM | POA: Diagnosis not present

## 2016-01-24 DIAGNOSIS — Z01818 Encounter for other preprocedural examination: Secondary | ICD-10-CM | POA: Diagnosis not present

## 2016-01-24 DIAGNOSIS — E119 Type 2 diabetes mellitus without complications: Secondary | ICD-10-CM | POA: Diagnosis not present

## 2016-01-24 DIAGNOSIS — Z01812 Encounter for preprocedural laboratory examination: Secondary | ICD-10-CM

## 2016-01-24 LAB — CBC WITH DIFFERENTIAL/PLATELET
BASOS PCT: 0 %
Basophils Absolute: 0 10*3/uL (ref 0.0–0.1)
Eosinophils Absolute: 0.2 10*3/uL (ref 0.0–0.7)
Eosinophils Relative: 2 %
HEMATOCRIT: 35.6 % — AB (ref 36.0–46.0)
HEMOGLOBIN: 11.8 g/dL — AB (ref 12.0–15.0)
LYMPHS ABS: 2 10*3/uL (ref 0.7–4.0)
Lymphocytes Relative: 21 %
MCH: 30.3 pg (ref 26.0–34.0)
MCHC: 33.1 g/dL (ref 30.0–36.0)
MCV: 91.3 fL (ref 78.0–100.0)
MONOS PCT: 5 %
Monocytes Absolute: 0.5 10*3/uL (ref 0.1–1.0)
NEUTROS ABS: 6.6 10*3/uL (ref 1.7–7.7)
NEUTROS PCT: 72 %
Platelets: 375 10*3/uL (ref 150–400)
RBC: 3.9 MIL/uL (ref 3.87–5.11)
RDW: 13.8 % (ref 11.5–15.5)
WBC: 9.4 10*3/uL (ref 4.0–10.5)

## 2016-01-24 LAB — BASIC METABOLIC PANEL
ANION GAP: 12 (ref 5–15)
BUN: 16 mg/dL (ref 6–20)
CO2: 24 mmol/L (ref 22–32)
CREATININE: 1.06 mg/dL — AB (ref 0.44–1.00)
Calcium: 9.4 mg/dL (ref 8.9–10.3)
Chloride: 100 mmol/L — ABNORMAL LOW (ref 101–111)
GFR calc non Af Amer: 54 mL/min — ABNORMAL LOW (ref >60)
Glucose, Bld: 229 mg/dL — ABNORMAL HIGH (ref 65–99)
Potassium: 3.8 mmol/L (ref 3.5–5.1)
Sodium: 136 mmol/L (ref 135–145)

## 2016-01-24 LAB — SURGICAL PCR SCREEN
MRSA, PCR: NEGATIVE
Staphylococcus aureus: NEGATIVE

## 2016-01-24 LAB — PREPARE RBC (CROSSMATCH)

## 2016-01-26 ENCOUNTER — Encounter (HOSPITAL_COMMUNITY)
Admission: RE | Disposition: A | Discharge status: Skilled Nursing Facility | Payer: Self-pay | Source: Ambulatory Visit | Attending: Orthopedic Surgery

## 2016-01-26 ENCOUNTER — Inpatient Hospital Stay (HOSPITAL_COMMUNITY)
Admission: RE | Admit: 2016-01-26 | Discharge: 2016-01-29 | DRG: 468 | Disposition: A | Discharge status: Skilled Nursing Facility | Payer: Medicare Other | Source: Ambulatory Visit | Attending: Orthopedic Surgery | Admitting: Orthopedic Surgery

## 2016-01-26 ENCOUNTER — Inpatient Hospital Stay (HOSPITAL_COMMUNITY): Payer: Medicare Other

## 2016-01-26 ENCOUNTER — Inpatient Hospital Stay (HOSPITAL_COMMUNITY): Payer: Medicare Other | Admitting: Anesthesiology

## 2016-01-26 ENCOUNTER — Encounter (HOSPITAL_COMMUNITY): Payer: Self-pay | Admitting: *Deleted

## 2016-01-26 DIAGNOSIS — J42 Unspecified chronic bronchitis: Secondary | ICD-10-CM | POA: Diagnosis not present

## 2016-01-26 DIAGNOSIS — T84021A Dislocation of internal left hip prosthesis, initial encounter: Principal | ICD-10-CM | POA: Diagnosis present

## 2016-01-26 DIAGNOSIS — Z823 Family history of stroke: Secondary | ICD-10-CM

## 2016-01-26 DIAGNOSIS — Z96649 Presence of unspecified artificial hip joint: Secondary | ICD-10-CM

## 2016-01-26 DIAGNOSIS — K219 Gastro-esophageal reflux disease without esophagitis: Secondary | ICD-10-CM | POA: Diagnosis present

## 2016-01-26 DIAGNOSIS — Z8543 Personal history of malignant neoplasm of ovary: Secondary | ICD-10-CM | POA: Diagnosis not present

## 2016-01-26 DIAGNOSIS — S73002D Unspecified subluxation of left hip, subsequent encounter: Secondary | ICD-10-CM | POA: Diagnosis not present

## 2016-01-26 DIAGNOSIS — T84028A Dislocation of other internal joint prosthesis, initial encounter: Secondary | ICD-10-CM

## 2016-01-26 DIAGNOSIS — Z794 Long term (current) use of insulin: Secondary | ICD-10-CM

## 2016-01-26 DIAGNOSIS — M179 Osteoarthritis of knee, unspecified: Secondary | ICD-10-CM | POA: Diagnosis not present

## 2016-01-26 DIAGNOSIS — T84021D Dislocation of internal left hip prosthesis, subsequent encounter: Secondary | ICD-10-CM | POA: Diagnosis not present

## 2016-01-26 DIAGNOSIS — Z96642 Presence of left artificial hip joint: Secondary | ICD-10-CM | POA: Diagnosis not present

## 2016-01-26 DIAGNOSIS — Y792 Prosthetic and other implants, materials and accessory orthopedic devices associated with adverse incidents: Secondary | ICD-10-CM | POA: Diagnosis present

## 2016-01-26 DIAGNOSIS — M4806 Spinal stenosis, lumbar region: Secondary | ICD-10-CM | POA: Diagnosis not present

## 2016-01-26 DIAGNOSIS — I10 Essential (primary) hypertension: Secondary | ICD-10-CM | POA: Diagnosis not present

## 2016-01-26 DIAGNOSIS — M24452 Recurrent dislocation, left hip: Secondary | ICD-10-CM | POA: Insufficient documentation

## 2016-01-26 DIAGNOSIS — K5900 Constipation, unspecified: Secondary | ICD-10-CM | POA: Diagnosis not present

## 2016-01-26 DIAGNOSIS — F17210 Nicotine dependence, cigarettes, uncomplicated: Secondary | ICD-10-CM | POA: Diagnosis present

## 2016-01-26 DIAGNOSIS — J449 Chronic obstructive pulmonary disease, unspecified: Secondary | ICD-10-CM | POA: Diagnosis present

## 2016-01-26 DIAGNOSIS — M5126 Other intervertebral disc displacement, lumbar region: Secondary | ICD-10-CM | POA: Diagnosis not present

## 2016-01-26 DIAGNOSIS — Z833 Family history of diabetes mellitus: Secondary | ICD-10-CM | POA: Diagnosis not present

## 2016-01-26 DIAGNOSIS — R262 Difficulty in walking, not elsewhere classified: Secondary | ICD-10-CM | POA: Diagnosis not present

## 2016-01-26 DIAGNOSIS — M545 Low back pain: Secondary | ICD-10-CM | POA: Diagnosis not present

## 2016-01-26 DIAGNOSIS — M5137 Other intervertebral disc degeneration, lumbosacral region: Secondary | ICD-10-CM | POA: Diagnosis not present

## 2016-01-26 DIAGNOSIS — E119 Type 2 diabetes mellitus without complications: Secondary | ICD-10-CM | POA: Diagnosis present

## 2016-01-26 DIAGNOSIS — R278 Other lack of coordination: Secondary | ICD-10-CM | POA: Diagnosis not present

## 2016-01-26 DIAGNOSIS — M1990 Unspecified osteoarthritis, unspecified site: Secondary | ICD-10-CM | POA: Diagnosis not present

## 2016-01-26 DIAGNOSIS — Z471 Aftercare following joint replacement surgery: Secondary | ICD-10-CM | POA: Diagnosis not present

## 2016-01-26 DIAGNOSIS — M6281 Muscle weakness (generalized): Secondary | ICD-10-CM | POA: Diagnosis not present

## 2016-01-26 DIAGNOSIS — M5430 Sciatica, unspecified side: Secondary | ICD-10-CM | POA: Diagnosis not present

## 2016-01-26 HISTORY — PX: TOTAL HIP REVISION: SHX763

## 2016-01-26 LAB — GLUCOSE, CAPILLARY
GLUCOSE-CAPILLARY: 94 mg/dL (ref 65–99)
Glucose-Capillary: 239 mg/dL — ABNORMAL HIGH (ref 65–99)
Glucose-Capillary: 189 mg/dL — ABNORMAL HIGH (ref 65–99)
Glucose-Capillary: 226 mg/dL — ABNORMAL HIGH (ref 65–99)

## 2016-01-26 LAB — HEMOGLOBIN AND HEMATOCRIT, BLOOD
HCT: 32.1 % — ABNORMAL LOW (ref 36.0–46.0)
Hemoglobin: 10.5 g/dL — ABNORMAL LOW (ref 12.0–15.0)

## 2016-01-26 SURGERY — TOTAL HIP REVISION
Anesthesia: Spinal | Site: Hip | Laterality: Left

## 2016-01-26 MED ORDER — MAGNESIUM CITRATE PO SOLN: 1.0000 | Freq: Once | ORAL | Status: DC | PRN

## 2016-01-26 MED ORDER — METHOCARBAMOL 1000 MG/10ML IJ SOLN
500.0000 mg | Freq: Four times a day (QID) | INTRAVENOUS | Status: DC | PRN
  Filled 2016-01-26: qty 5

## 2016-01-26 MED ORDER — LACTATED RINGERS IV SOLN
INTRAVENOUS | Status: DC
  Administered 2016-01-26 (×2): via INTRAVENOUS

## 2016-01-26 MED ORDER — EPHEDRINE SULFATE 50 MG/ML IJ SOLN
INTRAMUSCULAR | Status: DC | PRN
  Administered 2016-01-26 (×9): 10 mg via INTRAVENOUS

## 2016-01-26 MED ORDER — OXYCODONE-ACETAMINOPHEN 5-325 MG PO TABS
1.0000 | ORAL_TABLET | ORAL | Status: DC
  Administered 2016-01-26 – 2016-01-29 (×17): 1 via ORAL
  Filled 2016-01-26 (×18): qty 1

## 2016-01-26 MED ORDER — DEXTROSE 5 % IV SOLN
3.0000 g | INTRAVENOUS | Status: DC
  Administered 2016-01-26: 3 g via INTRAVENOUS
  Filled 2016-01-26: qty 3000

## 2016-01-26 MED ORDER — DOCUSATE SODIUM 100 MG PO CAPS
100.0000 mg | ORAL_CAPSULE | Freq: Two times a day (BID) | ORAL | Status: DC
  Administered 2016-01-26 – 2016-01-29 (×7): 100 mg via ORAL
  Filled 2016-01-26 (×7): qty 1

## 2016-01-26 MED ORDER — ASPIRIN EC 325 MG PO TBEC
325.0000 mg | DELAYED_RELEASE_TABLET | Freq: Two times a day (BID) | ORAL | Status: DC
  Administered 2016-01-26 – 2016-01-29 (×7): 325 mg via ORAL
  Filled 2016-01-26 (×7): qty 1

## 2016-01-26 MED ORDER — CALCIUM CARBONATE-VITAMIN D 500-200 MG-UNIT PO TABS
1.0000 | ORAL_TABLET | Freq: Every day | ORAL | Status: DC
  Administered 2016-01-26 – 2016-01-29 (×4): 1 via ORAL
  Filled 2016-01-26 (×4): qty 1

## 2016-01-26 MED ORDER — MIDAZOLAM HCL 2 MG/2ML IJ SOLN
INTRAMUSCULAR | Status: AC
  Filled 2016-01-26: qty 2

## 2016-01-26 MED ORDER — TRIAMTERENE-HCTZ 37.5-25 MG PO TABS
1.0000 | ORAL_TABLET | Freq: Every day | ORAL | Status: DC
  Administered 2016-01-26 – 2016-01-29 (×4): 1 via ORAL
  Filled 2016-01-26 (×4): qty 1

## 2016-01-26 MED ORDER — PROPOFOL 500 MG/50ML IV EMUL
INTRAVENOUS | Status: DC | PRN
  Administered 2016-01-26: 09:00:00 via INTRAVENOUS
  Administered 2016-01-26: 35 ug/kg/min via INTRAVENOUS
  Administered 2016-01-26: 09:00:00 via INTRAVENOUS
  Administered 2016-01-26: 35 ug/kg/min via INTRAVENOUS

## 2016-01-26 MED ORDER — POLYETHYLENE GLYCOL 3350 17 G PO PACK: 17.0000 g | PACK | Freq: Every day | ORAL | Status: DC | PRN

## 2016-01-26 MED ORDER — SODIUM CHLORIDE 0.9 % IR SOLN
Status: DC | PRN
  Administered 2016-01-26: 3000 mL

## 2016-01-26 MED ORDER — DEXTROSE 5 % IV SOLN
3.0000 g | Freq: Four times a day (QID) | INTRAVENOUS | Status: DC
  Filled 2016-01-26 (×2): qty 3000

## 2016-01-26 MED ORDER — INSULIN NPH (HUMAN) (ISOPHANE) 100 UNIT/ML ~~LOC~~ SUSP
30.0000 [IU] | Freq: Every day | SUBCUTANEOUS | Status: DC
  Administered 2016-01-26 – 2016-01-29 (×4): 30 [IU] via SUBCUTANEOUS
  Filled 2016-01-26: qty 10

## 2016-01-26 MED ORDER — BUPIVACAINE-EPINEPHRINE (PF) 0.5% -1:200000 IJ SOLN
INTRAMUSCULAR | Status: AC
  Filled 2016-01-26: qty 60

## 2016-01-26 MED ORDER — METOCLOPRAMIDE HCL 10 MG PO TABS: 5.0000 mg | ORAL_TABLET | Freq: Three times a day (TID) | ORAL | Status: DC | PRN

## 2016-01-26 MED ORDER — SODIUM CHLORIDE 0.9 % IV SOLN
INTRAVENOUS | Status: DC
  Administered 2016-01-26 – 2016-01-28 (×2): via INTRAVENOUS

## 2016-01-26 MED ORDER — OXYCODONE HCL 5 MG PO TABS
5.0000 mg | ORAL_TABLET | Freq: Once | ORAL | Status: AC
  Administered 2016-01-26: 5 mg via ORAL

## 2016-01-26 MED ORDER — PREGABALIN 50 MG PO CAPS: 50.0000 mg | ORAL_CAPSULE | Freq: Once | ORAL | Status: DC

## 2016-01-26 MED ORDER — FENTANYL CITRATE (PF) 100 MCG/2ML IJ SOLN
25.0000 ug | INTRAMUSCULAR | Status: AC
  Administered 2016-01-26 (×2): 25 ug via INTRAVENOUS

## 2016-01-26 MED ORDER — DEXAMETHASONE SODIUM PHOSPHATE 4 MG/ML IJ SOLN
10.0000 mg | Freq: Once | INTRAMUSCULAR | Status: AC
  Administered 2016-01-27: 10 mg via INTRAVENOUS
  Filled 2016-01-26: qty 3

## 2016-01-26 MED ORDER — KETOROLAC TROMETHAMINE 15 MG/ML IJ SOLN
15.0000 mg | Freq: Four times a day (QID) | INTRAMUSCULAR | Status: AC
Start: 2016-01-27 — End: 2016-01-27
  Administered 2016-01-26 – 2016-01-27 (×4): 15 mg via INTRAVENOUS
  Filled 2016-01-26 (×4): qty 1

## 2016-01-26 MED ORDER — SODIUM CHLORIDE 0.9 % IJ SOLN
INTRAMUSCULAR | Status: AC
  Filled 2016-01-26: qty 10

## 2016-01-26 MED ORDER — PHENYLEPHRINE HCL 10 MG/ML IJ SOLN
INTRAMUSCULAR | Status: DC | PRN
  Administered 2016-01-26 (×7): 40 ug via INTRAVENOUS

## 2016-01-26 MED ORDER — PHENYLEPHRINE 40 MCG/ML (10ML) SYRINGE FOR IV PUSH (FOR BLOOD PRESSURE SUPPORT)
PREFILLED_SYRINGE | INTRAVENOUS | Status: AC
  Filled 2016-01-26: qty 10

## 2016-01-26 MED ORDER — DIPHENHYDRAMINE HCL 12.5 MG/5ML PO ELIX: 12.5000 mg | ORAL_SOLUTION | ORAL | Status: DC | PRN

## 2016-01-26 MED ORDER — SODIUM CHLORIDE 0.9 % IJ SOLN
INTRAMUSCULAR | Status: AC
  Filled 2016-01-26: qty 40

## 2016-01-26 MED ORDER — LISINOPRIL 10 MG PO TABS
10.0000 mg | ORAL_TABLET | Freq: Every evening | ORAL | Status: DC
Start: 2016-01-26 — End: 2016-01-27

## 2016-01-26 MED ORDER — BUPIVACAINE LIPOSOME 1.3 % IJ SUSP
INTRAMUSCULAR | Status: AC
  Filled 2016-01-26: qty 20

## 2016-01-26 MED ORDER — ONDANSETRON HCL 4 MG PO TABS: 4.0000 mg | ORAL_TABLET | Freq: Four times a day (QID) | ORAL | Status: DC | PRN

## 2016-01-26 MED ORDER — MIDAZOLAM HCL 2 MG/2ML IJ SOLN
1.0000 mg | INTRAMUSCULAR | Status: DC | PRN
  Administered 2016-01-26 (×2): 1 mg via INTRAVENOUS

## 2016-01-26 MED ORDER — PROPOFOL 10 MG/ML IV BOLUS
INTRAVENOUS | Status: AC
  Filled 2016-01-26: qty 20

## 2016-01-26 MED ORDER — BUPIVACAINE IN DEXTROSE 0.75-8.25 % IT SOLN
INTRATHECAL | Status: DC | PRN
  Administered 2016-01-26: 15 mg via INTRATHECAL

## 2016-01-26 MED ORDER — EPINEPHRINE HCL 0.1 MG/ML IJ SOSY
PREFILLED_SYRINGE | INTRAMUSCULAR | Status: DC | PRN
  Administered 2016-01-26: .1 mL via INTRAVENOUS

## 2016-01-26 MED ORDER — CHLORHEXIDINE GLUCONATE 4 % EX LIQD: 60.0000 mL | Freq: Once | CUTANEOUS | Status: DC

## 2016-01-26 MED ORDER — PHENOL 1.4 % MT LIQD: 1.0000 | OROMUCOSAL | Status: DC | PRN

## 2016-01-26 MED ORDER — ACETAMINOPHEN 500 MG PO TABS
1000.0000 mg | ORAL_TABLET | Freq: Four times a day (QID) | ORAL | Status: AC
  Administered 2016-01-26 – 2016-01-27 (×4): 1000 mg via ORAL
  Filled 2016-01-26 (×4): qty 2

## 2016-01-26 MED ORDER — ONDANSETRON HCL 4 MG/2ML IJ SOLN
4.0000 mg | Freq: Once | INTRAMUSCULAR | Status: AC
  Administered 2016-01-26: 4 mg via INTRAVENOUS

## 2016-01-26 MED ORDER — ONDANSETRON HCL 4 MG/2ML IJ SOLN: 4.0000 mg | Freq: Four times a day (QID) | INTRAMUSCULAR | Status: DC | PRN

## 2016-01-26 MED ORDER — CELECOXIB 400 MG PO CAPS
400.0000 mg | ORAL_CAPSULE | Freq: Once | ORAL | Status: AC
  Administered 2016-01-26: 400 mg via ORAL

## 2016-01-26 MED ORDER — PROPOFOL 10 MG/ML IV BOLUS
INTRAVENOUS | Status: DC | PRN
  Administered 2016-01-26 (×4): 10 mg via INTRAVENOUS

## 2016-01-26 MED ORDER — FENTANYL CITRATE (PF) 100 MCG/2ML IJ SOLN
INTRAMUSCULAR | Status: AC
  Filled 2016-01-26: qty 2

## 2016-01-26 MED ORDER — ATENOLOL 25 MG PO TABS
50.0000 mg | ORAL_TABLET | Freq: Every day | ORAL | Status: DC
  Administered 2016-01-27 – 2016-01-29 (×3): 50 mg via ORAL
  Filled 2016-01-26 (×4): qty 2

## 2016-01-26 MED ORDER — BUPIVACAINE-EPINEPHRINE (PF) 0.5% -1:200000 IJ SOLN
INTRAMUSCULAR | Status: AC
  Filled 2016-01-26: qty 30

## 2016-01-26 MED ORDER — METHOCARBAMOL 500 MG PO TABS
500.0000 mg | ORAL_TABLET | Freq: Four times a day (QID) | ORAL | Status: DC | PRN
  Administered 2016-01-27 (×2): 500 mg via ORAL
  Filled 2016-01-26 (×2): qty 1

## 2016-01-26 MED ORDER — FENTANYL CITRATE (PF) 100 MCG/2ML IJ SOLN
INTRAMUSCULAR | Status: DC | PRN
  Administered 2016-01-26: 25 ug via INTRAVENOUS
  Administered 2016-01-26: 30 ug via INTRAVENOUS
  Administered 2016-01-26: 20 ug via INTRATHECAL

## 2016-01-26 MED ORDER — LIDOCAINE HCL (PF) 1 % IJ SOLN
INTRAMUSCULAR | Status: AC
  Filled 2016-01-26: qty 10

## 2016-01-26 MED ORDER — POLYETHYLENE GLYCOL 3350 17 G PO PACK
17.0000 g | PACK | Freq: Every day | ORAL | Status: DC
  Administered 2016-01-26 – 2016-01-29 (×4): 17 g via ORAL
  Filled 2016-01-26 (×4): qty 1

## 2016-01-26 MED ORDER — VITAMIN D 1000 UNITS PO TABS
1000.0000 [IU] | ORAL_TABLET | Freq: Every day | ORAL | Status: DC
  Administered 2016-01-26 – 2016-01-29 (×4): 1000 [IU] via ORAL
  Filled 2016-01-26 (×4): qty 1

## 2016-01-26 MED ORDER — CEFAZOLIN SODIUM-DEXTROSE 2-4 GM/100ML-% IV SOLN
INTRAVENOUS | Status: AC
  Filled 2016-01-26: qty 100

## 2016-01-26 MED ORDER — METHOCARBAMOL 1000 MG/10ML IJ SOLN
500.0000 mg | Freq: Once | INTRAVENOUS | Status: AC
  Administered 2016-01-26: 500 mg via INTRAVENOUS
  Filled 2016-01-26: qty 5

## 2016-01-26 MED ORDER — ALBUTEROL SULFATE HFA 108 (90 BASE) MCG/ACT IN AERS: 2.0000 | INHALATION_SPRAY | RESPIRATORY_TRACT | Status: DC | PRN

## 2016-01-26 MED ORDER — EPHEDRINE SULFATE 50 MG/ML IJ SOLN
INTRAMUSCULAR | Status: AC
  Filled 2016-01-26: qty 1

## 2016-01-26 MED ORDER — ONDANSETRON HCL 4 MG/2ML IJ SOLN
INTRAMUSCULAR | Status: AC
  Filled 2016-01-26: qty 2

## 2016-01-26 MED ORDER — ALBUTEROL SULFATE (2.5 MG/3ML) 0.083% IN NEBU: 3.0000 mL | INHALATION_SOLUTION | Freq: Four times a day (QID) | RESPIRATORY_TRACT | Status: DC | PRN

## 2016-01-26 MED ORDER — CEFAZOLIN SODIUM 1-5 GM-% IV SOLN: 1.0000 g | Freq: Once | INTRAVENOUS | Status: DC

## 2016-01-26 MED ORDER — INSULIN ASPART 100 UNIT/ML ~~LOC~~ SOLN
0.0000 [IU] | SUBCUTANEOUS | Status: DC
  Administered 2016-01-26: 8 [IU] via SUBCUTANEOUS
  Administered 2016-01-27: 2 [IU] via SUBCUTANEOUS
  Filled 2016-01-26: qty 0.24

## 2016-01-26 MED ORDER — MENTHOL 3 MG MT LOZG: 1.0000 | LOZENGE | OROMUCOSAL | Status: DC | PRN

## 2016-01-26 MED ORDER — CELECOXIB 400 MG PO CAPS
ORAL_CAPSULE | ORAL | Status: AC
  Filled 2016-01-26: qty 1

## 2016-01-26 MED ORDER — VERAPAMIL HCL ER 240 MG PO TBCR
240.0000 mg | EXTENDED_RELEASE_TABLET | Freq: Every day | ORAL | Status: DC
  Administered 2016-01-27 – 2016-01-29 (×3): 240 mg via ORAL
  Filled 2016-01-26 (×4): qty 1

## 2016-01-26 MED ORDER — ONDANSETRON HCL 4 MG/2ML IJ SOLN: 4.0000 mg | Freq: Once | INTRAMUSCULAR | Status: DC | PRN

## 2016-01-26 MED ORDER — POVIDONE-IODINE 10 % EX SWAB: 2.0000 "application " | Freq: Once | CUTANEOUS | Status: DC

## 2016-01-26 MED ORDER — BUPIVACAINE-EPINEPHRINE 0.5% -1:200000 IJ SOLN
INTRAMUSCULAR | Status: DC | PRN
  Administered 2016-01-26: 60 mL

## 2016-01-26 MED ORDER — HYDROMORPHONE HCL 1 MG/ML IJ SOLN
0.5000 mg | INTRAMUSCULAR | Status: DC | PRN
Start: 2016-01-26 — End: 2016-01-29
  Administered 2016-01-26 – 2016-01-27 (×3): 0.5 mg via INTRAVENOUS
  Filled 2016-01-26 (×3): qty 1

## 2016-01-26 MED ORDER — CEFAZOLIN SODIUM-DEXTROSE 2-4 GM/100ML-% IV SOLN: 2.0000 g | Freq: Once | INTRAVENOUS | Status: DC

## 2016-01-26 MED ORDER — SODIUM CHLORIDE 0.9 % IR SOLN
Status: DC | PRN
  Administered 2016-01-26: 1000 mL

## 2016-01-26 MED ORDER — OXYCODONE HCL 5 MG PO TABS
ORAL_TABLET | ORAL | Status: AC
  Filled 2016-01-26: qty 1

## 2016-01-26 MED ORDER — CEFAZOLIN SODIUM 1-5 GM-% IV SOLN
INTRAVENOUS | Status: AC
  Filled 2016-01-26: qty 50

## 2016-01-26 MED ORDER — CEFAZOLIN SODIUM 1-5 GM-% IV SOLN
1.0000 g | Freq: Four times a day (QID) | INTRAVENOUS | Status: AC
  Administered 2016-01-26 (×2): 1 g via INTRAVENOUS
  Filled 2016-01-26 (×2): qty 50

## 2016-01-26 MED ORDER — CEFAZOLIN SODIUM-DEXTROSE 2-4 GM/100ML-% IV SOLN
2.0000 g | Freq: Four times a day (QID) | INTRAVENOUS | Status: AC
  Administered 2016-01-26 (×2): 2 g via INTRAVENOUS
  Filled 2016-01-26 (×2): qty 100

## 2016-01-26 MED ORDER — MIDAZOLAM HCL 5 MG/5ML IJ SOLN
INTRAMUSCULAR | Status: DC | PRN
  Administered 2016-01-26 (×4): 1 mg via INTRAVENOUS

## 2016-01-26 MED ORDER — FENTANYL CITRATE (PF) 100 MCG/2ML IJ SOLN: 25.0000 ug | INTRAMUSCULAR | Status: DC | PRN

## 2016-01-26 MED ORDER — ALUM & MAG HYDROXIDE-SIMETH 200-200-20 MG/5ML PO SUSP: 30.0000 mL | ORAL | Status: DC | PRN

## 2016-01-26 MED ORDER — METOCLOPRAMIDE HCL 5 MG/ML IJ SOLN: 5.0000 mg | Freq: Three times a day (TID) | INTRAMUSCULAR | Status: DC | PRN

## 2016-01-26 MED ORDER — SODIUM CHLORIDE 0.9 % IV SOLN
INTRAVENOUS | Status: DC | PRN
  Administered 2016-01-26: 10:00:00 via INTRAVENOUS

## 2016-01-26 SURGICAL SUPPLY — 69 items
ANCHOR SUT CORKSCREW 3.5X12 (Anchor) ×9 IMPLANT
BALL HIP ARTICU EZE 36 8.5 (Hips) ×1 IMPLANT
BIT DRILL 2.8X128 (BIT) ×2 IMPLANT
BIT DRILL 2.8X128MM (BIT) ×1
BLADE HEX COATED 2.75 (ELECTRODE) ×3 IMPLANT
BLADE SAW SGTL 83.5X18.5 (BLADE) IMPLANT
CHLORAPREP W/TINT 26ML (MISCELLANEOUS) ×3 IMPLANT
CLOTH BEACON ORANGE TIMEOUT ST (SAFETY) ×3 IMPLANT
COVER LIGHT HANDLE STERIS (MISCELLANEOUS) ×6 IMPLANT
COVER MAYO STAND XLG (DRAPE) ×3 IMPLANT
COVER PROBE W GEL 5X96 (DRAPES) IMPLANT
DECANTER SPIKE VIAL GLASS SM (MISCELLANEOUS) ×6 IMPLANT
DRAPE BACK TABLE (DRAPES) ×3 IMPLANT
DRAPE HIP W/POCKET STRL (DRAPE) ×3 IMPLANT
DRESSING AQUACEL AG ADV 3.5X12 (MISCELLANEOUS) ×1 IMPLANT
DRSG AQUACEL AG ADV 3.5X12 (MISCELLANEOUS) ×3
DRSG MEPILEX BORDER 4X12 (GAUZE/BANDAGES/DRESSINGS) ×3 IMPLANT
ELECT REM PT RETURN 9FT ADLT (ELECTROSURGICAL) ×3
ELECTRODE REM PT RTRN 9FT ADLT (ELECTROSURGICAL) ×1 IMPLANT
ELIMINATOR HOLE APEX DEPUY (Hips) ×3 IMPLANT
FACESHIELD LNG OPTICON STERILE (SAFETY) IMPLANT
FORMALIN 10 PREFIL 480ML (MISCELLANEOUS) IMPLANT
GLOVE BIOGEL M 7.0 STRL (GLOVE) ×6 IMPLANT
GLOVE BIOGEL PI IND STRL 7.0 (GLOVE) ×5 IMPLANT
GLOVE BIOGEL PI INDICATOR 7.0 (GLOVE) ×10
GLOVE ECLIPSE 6.5 STRL STRAW (GLOVE) ×3 IMPLANT
GLOVE EXAM NITRILE MD LF STRL (GLOVE) ×6 IMPLANT
GLOVE SKINSENSE NS SZ8.0 LF (GLOVE) ×2
GLOVE SKINSENSE STRL SZ8.0 LF (GLOVE) ×1 IMPLANT
GLOVE SS N UNI LF 8.5 STRL (GLOVE) ×3 IMPLANT
GOWN STRL REUS W/TWL LRG LVL3 (GOWN DISPOSABLE) ×12 IMPLANT
GOWN STRL REUS W/TWL XL LVL3 (GOWN DISPOSABLE) ×3 IMPLANT
HANDPIECE INTERPULSE COAX TIP (DISPOSABLE)
HIP BALL ARTICU EZE 36 8.5 (Hips) ×3 IMPLANT
HOOD W/PEELAWAY (MISCELLANEOUS) ×12 IMPLANT
INST SET MAJOR BONE (KITS) ×3 IMPLANT
IV NS IRRIG 3000ML ARTHROMATIC (IV SOLUTION) ×3 IMPLANT
KIT ROOM TURNOVER APOR (KITS) ×3 IMPLANT
LINER NEUTRAL 52X36MM PLUS 4 (Liner) ×3 IMPLANT
MANIFOLD NEPTUNE II (INSTRUMENTS) ×3 IMPLANT
MARKER SKIN DUAL TIP RULER LAB (MISCELLANEOUS) ×3 IMPLANT
NEEDLE HYPO 21X1.5 SAFETY (NEEDLE) ×3 IMPLANT
NEEDLE MAYO 6 CRC TAPER PT (NEEDLE) ×3 IMPLANT
NS IRRIG 1000ML POUR BTL (IV SOLUTION) ×3 IMPLANT
PACK TOTAL JOINT (CUSTOM PROCEDURE TRAY) ×3 IMPLANT
PAD ARMBOARD 7.5X6 YLW CONV (MISCELLANEOUS) ×3 IMPLANT
PASSER SUT SWANSON 36MM LOOP (INSTRUMENTS) IMPLANT
PILLOW HIP ABDUCTION LRG (ORTHOPEDIC SUPPLIES) IMPLANT
PILLOW HIP ABDUCTION MED (ORTHOPEDIC SUPPLIES) ×3 IMPLANT
PIN SECTOR W/GRIP ACE CUP 52MM (Hips) ×3 IMPLANT
PIN STMN 9X.142 IN (PIN) ×6 IMPLANT
SCREW 6.5MMX25MM (Screw) ×6 IMPLANT
SET BASIN LINEN APH (SET/KITS/TRAYS/PACK) ×3 IMPLANT
SET HNDPC FAN SPRY TIP SCT (DISPOSABLE) IMPLANT
SPONGE LAP 18X18 X RAY DECT (DISPOSABLE) ×12 IMPLANT
STAPLER VISISTAT 35W (STAPLE) ×3 IMPLANT
SUT BRALON NAB BRD #1 30IN (SUTURE) ×12 IMPLANT
SUT ETHIBOND 5 LR DA (SUTURE) ×6 IMPLANT
SUT MNCRL 0 VIOLET CTX 36 (SUTURE) ×3 IMPLANT
SUT MON AB 2-0 CT1 36 (SUTURE) IMPLANT
SUT MONOCRYL 0 CTX 36 (SUTURE) ×6
SUT SURGILON 1 5X18 (SUTURE) ×9 IMPLANT
SUT VIC AB 1 CT1 27 (SUTURE) ×16
SUT VIC AB 1 CT1 27XBRD ANTBC (SUTURE) ×8 IMPLANT
SYR 30ML LL (SYRINGE) ×3 IMPLANT
SYR BULB IRRIGATION 50ML (SYRINGE) ×3 IMPLANT
TOWEL OR 17X26 4PK STRL BLUE (TOWEL DISPOSABLE) ×3 IMPLANT
TRAY FOLEY CATH SILVER 16FR (SET/KITS/TRAYS/PACK) ×3 IMPLANT
YANKAUER SUCT 12FT TUBE ARGYLE (SUCTIONS) ×3 IMPLANT

## 2016-01-26 NOTE — Transfer of Care (Signed)
Immediate Anesthesia Transfer of Care Note  Patient: [name]  Procedure(s) Performed: Procedure(s): LEFT TOTAL HIP REVISION (Left)  Patient Location: PACU  Anesthesia Type:Spinal  Level of Consciousness: awake, alert  and oriented  Airway & Oxygen Therapy: Patient Spontanous Breathing  Post-op Assessment: Report given to RN and Post -op Vital signs reviewed and stable  Post vital signs: Reviewed and stable  Last Vitals:  Filed Vitals:   01/26/16 0725 01/26/16 0730  BP: 152/80 137/73  Pulse:    Temp:    Resp: 13 17    Last Pain:  Filed Vitals:   01/26/16 1112  PainSc: 3       Patients Stated Pain Goal: 4 ([id number])  Complications: No apparent anesthesia complications

## 2016-01-26 NOTE — Op Note (Signed)
01/26/2016  11:09 AM  PATIENT:  [name]  65 y.o. female  PRE-OPERATIVE DIAGNOSIS:  recurrent dislocation left hip prosthesis  POST-OPERATIVE DIAGNOSIS:  recurrent dislocation left hip prosthesis  PROCEDURE:  Procedure(s): LEFT TOTAL HIP REVISION (Left)  Revision of acetabulum only  Surgeon Aline Brochure  Assisted by Simonne Maffucci and Elmyra Ricks small  Implants we used a 52 cup with a 36 head and a +4 nonelevated neutral liner with a +8.5-36 head   EBL:  Total I/O In: 3000 [I.V.:2250; Blood:350; IV Piggyback:400] Out: 670 [Urine:70; Blood:600]  There were no drains  We used 60 mL of Marcaine with epinephrine  SPECIMEN:  No Specimen  DISPOSITION OF SPECIMEN:  N/A  COUNTS:  YES  TOURNIQUET:  * No tourniquets in log *  DICTATION: .Dragon Dictation  PLAN OF CARE: Admit to inpatient   PATIENT DISPOSITION:  PACU - hemodynamically stable.   Delay start of Pharmacological VTE agent (>24hrs) due to surgical blood loss or risk of bleeding: yes  Surgical findings  Prior to anesthesia the patient's left leg was arranged and I could not dislocate the hip. We placed the hip in 120 of flexion. We placed the hip in 90 flexion 50 external rotation and 90 flexion 40 internal rotation, 0 extension 50 external rotation +5 extension 50 external rotation, sleep position, and I could not get the hip out of joint  Details of procedure  Site marking and site identification and chart review were performed in the preop area the patient was taken to the operating room for spinal anesthesia. She was then placed in the supine position. A Foley catheter was inserted.  Prior to positioning the hip was ranged as stated above and no dislocation was reproduced.  The patient was placed lateral decubitus position left side up with axillary roll. Hip positioner was used to stabilize the pelvis.  Left leg was prepped and draped sterilely  Timeout was completed  Radiographs were available  implants were checked  I used a prior incision and deepened through subcutaneous tissue. Once through the subcutaneous tissue a large hematoma approximate 500 mL of old blood was evacuated.  There was a fascial tear. Planes were reestablished separating the fascia from the underlying abductor musculature and vastus lateralis. There was a lot of scarring and bleeding which was controlled with electrocautery.  Once the fascial layer was separated and tagged we found a tear at the greater trochanter in the abductor musculature. We split the proximal abductors staying within 4 cm of the trochanter and carried this down through the vastus lateralis and made medial or anterior and posterior flaps protecting the sciatic nerve.  The hip was in its position reduced.  We used a bone hook and traction to dislocate the hip. We removed the hip ball or head and check the stem for stability. The stem was stable  We then did soft tissue releases to gain access to the acetabulum. We used an osteotome to loosen the cup using a special instrument to go around the cup and it was dislodged. Once this was dislodged I inspected the anterior and posterior columns and found them to be in good stead. Adequate bone was noted anteriorly and posteriorly.  Proper retractors were placed anteriorly posteriorly inferiorly and superiorly. I re-ream the acetabulum with a 50, 51 and 52 reamer.  I then placed a reduction cup with 3 holes and took an x-ray. I was unsatisfactory with the abduction of the cup*remove the cup again re-reamed with less abduction approximate  45 position and then took another x-ray. The cut was an approximate 40 abduction I was satisfied with that. We then did a trial reduction with a 36 hip ball and a 36 liner and a +5 head. I did like the shuck test although the hip was otherwise stable so I put on an 8.5 head and this gave excellent stability in all positions including flexion internal rotation, flexion  external rotation, shuck test, extension external rotation 50. Hyperextension 5 external rotation 50. Sleep position.  We then took the trial liner out took the trial head off place the hole eliminator and 2 screws after drilling. We placed the 36+8.5 head and re-reduced the hip. We were able to reproduce our stability test. We irrigated copiously and thoroughly and then closed the abductors with #1 Bralon suture interrupted fashion we close the tear with 2/3.5 corkscrew suture anchors.  We irrigated again injected 30 mL of Marcaine with epinephrine.  We then abducted the leg and closed the fascia including the fascial tear with #1 Bralon interrupted and running suture.  We irrigated again injected 30 mL of Marcaine again and then closed the subcutaneous fat with 0 Monocryl suture in interrupted fashion  We applied staples to the skin  We placed the patient supine checked leg lengths they were equal. Abductor pillow was placed. Patient was taken to recovery in stable condition.  Postop plan Reeducation on leg positioning and hip precautions Full weightbearing Aspirin for DVT prevention Abductor pillow for 6 weeks when lying in bed

## 2016-01-26 NOTE — Brief Op Note (Addendum)
01/26/2016  11:09 AM  PATIENT:  [name]  65 y.o. female  PRE-OPERATIVE DIAGNOSIS:  recurrent dislocation left hip prosthesis  POST-OPERATIVE DIAGNOSIS:  recurrent dislocation left hip prosthesis  PROCEDURE:  Procedure(s): LEFT TOTAL HIP REVISION (Left)  Revision of acetabulum only  Surgeon Aline Brochure  Assisted by Simonne Maffucci and Elmyra Ricks small  Implants we used a 52 cup with a 36 head and a +4 nonelevated neutral liner with a +8.5-36 head   EBL:  Total I/O In: 3000 [I.V.:2250; Blood:350; IV Piggyback:400] Out: 670 [Urine:70; Blood:600]  There were no drains  We used 60 mL of Marcaine with epinephrine  SPECIMEN:  No Specimen  DISPOSITION OF SPECIMEN:  N/A  COUNTS:  YES  TOURNIQUET:  * No tourniquets in log *  DICTATION: .Dragon Dictation  PLAN OF CARE: Admit to inpatient   PATIENT DISPOSITION:  PACU - hemodynamically stable.   Delay start of Pharmacological VTE agent (>24hrs) due to surgical blood loss or risk of bleeding: yes  Surgical findings  Prior to anesthesia the patient's left leg was arranged and I could not dislocate the hip. We placed the hip in 120 of flexion. We placed the hip in 90 flexion 50 external rotation and 90 flexion 40 internal rotation, 0 extension 50 external rotation +5 extension 50 external rotation, sleep position, and I could not get the hip out of joint  Details of procedure  Site marking and site identification and chart review were performed in the preop area the patient was taken to the operating room for spinal anesthesia. She was then placed in the supine position. A Foley catheter was inserted.  Prior to positioning the hip was ranged as stated above and no dislocation was reproduced.  The patient was placed lateral decubitus position left side up with axillary roll. Hip positioner was used to stabilize the pelvis.  Left leg was prepped and draped sterilely  Timeout was completed  Radiographs were available  implants were checked  I used a prior incision and deepened through subcutaneous tissue. Once through the subcutaneous tissue a large hematoma approximate 500 mL of old blood was evacuated.  There was a fascial tear. Planes were reestablished separating the fascia from the underlying abductor musculature and vastus lateralis. There was a lot of scarring and bleeding which was controlled with electrocautery.  Once the fascial layer was separated and tagged we found a tear at the greater trochanter in the abductor musculature. We split the proximal abductors staying within 4 cm of the trochanter and carried this down through the vastus lateralis and made medial or anterior and posterior flaps protecting the sciatic nerve.  The hip was in its position reduced.  We used a bone hook and traction to dislocate the hip. We removed the hip ball or head and check the stem for stability. The stem was stable  We then did soft tissue releases to gain access to the acetabulum. We used an osteotome to loosen the cup using a special instrument to go around the cup and it was dislodged. Once this was dislodged I inspected the anterior and posterior columns and found them to be in good stead. Adequate bone was noted anteriorly and posteriorly.  Proper retractors were placed anteriorly posteriorly inferiorly and superiorly. I re-ream the acetabulum with a 50, 51 and 52 reamer.  I then placed a reduction cup with 3 holes and took an x-ray. I was unsatisfactory with the abduction of the cup*remove the cup again re-reamed with less abduction approximate  45 position and then took another x-ray. The cut was an approximate 40 abduction I was satisfied with that. We then did a trial reduction with a 36 hip ball and a 36 liner and a +5 head. I did like the shuck test although the hip was otherwise stable so I put on an 8.5 head and this gave excellent stability in all positions including flexion internal rotation, flexion  external rotation, shuck test, extension external rotation 50. Hyperextension 5 external rotation 50. Sleep position.  We then took the trial liner out took the trial head off place the hole eliminator and 2 screws after drilling. We placed the 36+8.5 head and re-reduced the hip. We were able to reproduce our stability test. We irrigated copiously and thoroughly and then closed the abductors with #1 Bralon suture interrupted fashion we close the tear with 2/3.5 corkscrew suture anchors.  We irrigated again injected 30 mL of Marcaine with epinephrine.  We then abducted the leg and closed the fascia including the fascial tear with #1 Bralon interrupted and running suture.  We irrigated again injected 30 mL of Marcaine again and then closed the subcutaneous fat with 0 Monocryl suture in interrupted fashion  We applied staples to the skin  We placed the patient supine checked leg lengths they were equal. Abductor pillow was placed. Patient was taken to recovery in stable condition.  Postop plan Reeducation on leg positioning and hip precautions Full weightbearing Aspirin for DVT prevention Abductor pillow for 6 weeks when lying in bed

## 2016-01-26 NOTE — Interval H&P Note (Signed)
History and Physical Interval Note:  01/26/2016 7:23 AM  [name]  has presented today for surgery, with the diagnosis of recurrent dislocation left hip  The various methods of treatment have been discussed with the patient and family. After consideration of risks, benefits and other options for treatment, the patient has consented to  Procedure(s) with comments: TOTAL HIP REVISION (Left) - pt knows to arrive at 6:15 as a surgical intervention .  The patient's history has been reviewed, patient examined, no change in status, stable for surgery.  I have reviewed the patient's chart and labs.  Questions were answered to the patient's satisfaction.     Arther Abbott

## 2016-01-26 NOTE — H&P (Signed)
TOTAL HIP REVISION ADMISSION H&P  Patient is admitted for left revision total hip arthroplasty.  Subjective:  Chief Complaint: left hip dislocation recurring x 2   HPI: [name], 65 y.o. female, who underwent primary left total hip arthroplasty with direct lateral approach on 12/08/2015 without any complications. She went to rehabilitation and did well up until April 20. She sat down in a chair at home reached to her right tibial plug in a charger and dislocated her hip. She had a reduction in the ER under conscious sedation. On postop evaluation in the office hip was stable  9 days later on April 29 she dislocated the hip again unsuccessful closed reduction in the emergency room under conscious sedation was noted and I took her to surgery for closed reduction under general anesthesia. After the reduction manipulation of the hip under anesthesia I could not dislocate the hip.  After talking to the patient about her options she says the pain was so severe after the dislocations that she would rather have revision surgery then dislocate the hip again.  We suspect that this is a positional dislocation as the patient was sitting down in the chair she had her leg extended and externally rotated which is the unsafe position for direct lateral approach.  This patient has had Two prior reductions as stated.  There is no current active infection.  Patient Active Problem List   Diagnosis Date Noted  . Dislocation of hip prosthesis ([place]) 01/15/2016  . Mechanical complication of internal orthopedic device ([place])   . Cough 12/17/2015  . Osteoarthritis of left hip   . Dysphagia 04/02/2014  . Unspecified constipation 04/02/2014  . GERD (gastroesophageal reflux disease) 04/02/2013  . Chronic bronchitis with acute exacerbation ([place]) 04/02/2013  . History of ovarian cancer 04/02/2013  . Spinal stenosis, lumbar region, with neurogenic claudication 02/27/2013  . Iliotibial band tendonitis 02/27/2013    . DEGENERATIVE DISC DISEASE, LUMBOSACRAL SPINE 12/09/2009  . H N P-LUMBAR 09/29/2009  . KNEE, ARTHRITIS, DEGEN./OSTEO 09/23/2008  . SCIATICA 05/14/2008  . Insulin dependent diabetes mellitus ([place]) 05/12/2008  . History of cardiovascular disorder 05/12/2008   Past Medical History  Diagnosis Date  . Diabetes mellitus     Insulin-dependent diabetes  . Hypertension   . Cancer ([place])     ovarian  . COPD (chronic obstructive pulmonary disease) ([place])   . Arthritis     Past Surgical History  Procedure Laterality Date  . Replacement total knee      bilateral  . Cholecystectomy    . Colonoscopy  2007    Dr. Gala Romney: internal hemorrhoids, few scattered sigmoid diverticula  . Esophagogastroduodenoscopy  2008    Dr. Gala Romney: erosive esophagitis, patulous EG junction  . Colonoscopy N/A 04/09/2014    [id number] diverticulosis. Redundant colon. Single colonic polyp-removed as described above.  . Esophagogastroduodenoscopy N/A 04/09/2014    [id number] reflux esophagitis. Schatzki's ring-status post dilation as described above. Hiatal hernia. Gastricerosions-status post gastric biopsy  . Savory dilation N/A 04/09/2014    Procedure: SAVORY DILATION;  Surgeon: Daneil Dolin, MD;  Location: [facility];  Service: Endoscopy;  Laterality: N/A;  Venia Minks dilation N/A 04/09/2014    Procedure: Venia Minks DILATION;  Surgeon: Daneil Dolin, MD;  Location: [facility];  Service: Endoscopy;  Laterality: N/A;  . Abdominal hysterectomy      BSO  . Abdominal surgery      for bleeding after hysterectomy  . Left hip replacement  12/08/2015  . Total hip arthroplasty  Left 12/08/2015    Procedure: TOTAL HIP ARTHROPLASTY;  Surgeon: Carole Civil, MD;  Location: [facility];  Service: Orthopedics;  Laterality: Left;  . Hip closed reduction Left 01/15/2016    Procedure: CLOSED REDUCTION LEFT HIP PROSTHESIS;  Surgeon: Carole Civil, MD;  Location: [facility];  Service: Orthopedics;  Laterality: Left;     Prescriptions prior to admission  Medication Sig Dispense Refill Last Dose  . albuterol (ACCUNEB) 1.25 MG/3ML nebulizer solution Take 1 ampule by nebulization every 6 (six) hours as needed for wheezing or shortness of breath.   unknown  . albuterol (PROVENTIL HFA;VENTOLIN HFA) 108 (90 BASE) MCG/ACT inhaler Inhale 2 puffs into the lungs every 4 (four) hours as needed for wheezing or shortness of breath. 1 Inhaler 1 01/26/2016 at 0530  . aspirin EC 325 MG tablet Take 325 mg by mouth 2 (two) times daily.   01/25/2016 at Unknown time  . atenolol (TENORMIN) 50 MG tablet Take 50 mg by mouth daily.   01/26/2016 at 0530  . calcium-vitamin D (OSCAL WITH D) 500-200 MG-UNIT tablet Take 1 tablet by mouth daily.   01/25/2016 at Unknown time  . Cholecalciferol (VITAMIN D3) 1000 units CAPS Take 1 capsule by mouth daily.    01/25/2016 at Unknown time  . docusate sodium (COLACE) 100 MG capsule Take 100 mg by mouth 2 (two) times daily.   01/25/2016 at Unknown time  . HYDROcodone-acetaminophen (NORCO) 7.5-325 MG tablet Take 1 tablet by mouth every 4 (four) hours as needed for moderate pain. 60 tablet 0 01/26/2016 at 0530  . insulin NPH Human (HUMULIN N,NOVOLIN N) 100 UNIT/ML injection Inject 30 Units into the skin daily.    01/24/2016 at Unknown time  . insulin regular (NOVOLIN R,HUMULIN R) 100 units/mL injection Inject 10 Units into the skin 3 (three) times daily before meals.    01/24/2016 at Unknown time  . lisinopril (PRINIVIL,ZESTRIL) 10 MG tablet Take 10 mg by mouth every evening.    01/25/2016 at Unknown time  . meloxicam (MOBIC) 15 MG tablet Take 1 tablet by mouth daily.   01/25/2016 at Unknown time  . polyethylene glycol (MIRALAX / GLYCOLAX) packet Take 17 g by mouth daily as needed for mild constipation.   01/25/2016 at Unknown time  . triamterene-hydrochlorothiazide (DYAZIDE) 37.5-25 MG capsule Take 1 capsule by mouth daily.   01/25/2016 at Unknown time  . verapamil (CALAN-SR) 240 MG CR tablet Take 1 tablet by mouth daily.    01/26/2016 at 0530   Allergies  Allergen Reactions  . Gabapentin Other (See Comments)    suicidal thoughts    Social History  Substance Use Topics  . Smoking status: Current Every Day Smoker -- 1.00 packs/day for 54 years    Types: Cigarettes  . Smokeless tobacco: Never Used  . Alcohol Use: No    Family History  Problem Relation Age of Onset  . Colon cancer Neg Hx   . Stroke Mother   . Diabetes Mother   . Diabetes Father       Review of Systems  Constitutional: Negative for fever, chills, weight loss and malaise/fatigue.  HENT: Negative for ear discharge and nosebleeds.   Eyes: Negative for blurred vision and discharge.  Respiratory: Positive for shortness of breath and wheezing.   Cardiovascular: Negative for chest pain, orthopnea and leg swelling.  Gastrointestinal: Negative for heartburn, abdominal pain and constipation.  Genitourinary: Negative for dysuria and frequency.  Musculoskeletal: Positive for back pain. Negative for myalgias and falls.  Skin: Negative  for rash.  Neurological: Negative for tingling, sensory change, focal weakness, seizures, weakness and headaches.  Endo/Heme/Allergies: Negative for environmental allergies and polydipsia. Does not bruise/bleed easily.  Psychiatric/Behavioral: Negative for depression, memory loss and substance abuse. The patient is not nervous/anxious.     Objective:  Physical Exam  Constitutional: She is oriented to person, place, and time. She appears well-developed and well-nourished. No distress.  HENT:  Head: Normocephalic and atraumatic.  Eyes: Pupils are equal, round, and reactive to light. Right eye exhibits no discharge. Left eye exhibits no discharge. No scleral icterus.  Neck: Normal range of motion. Neck supple. No JVD present. No tracheal deviation present. No thyromegaly present.  Cardiovascular: Normal rate and intact distal pulses.   Respiratory: Effort normal. No stridor. No respiratory distress.  GI: Soft.  Bowel sounds are normal. She exhibits no distension. There is no tenderness. There is no rebound.  Lymphadenopathy:    She has no cervical adenopathy.       Right: No inguinal adenopathy present.       Left: No inguinal adenopathy present.  Neurological: She is alert and oriented to person, place, and time. She has normal reflexes. She displays normal reflexes. No cranial nerve deficit. She exhibits normal muscle tone. Coordination normal.  Skin: Skin is warm and dry. No rash noted. She is not diaphoretic. No erythema. No pallor.     Psychiatric: She has a normal mood and affect. Her behavior is normal. Judgment and thought content normal.   Musculoskeletal: Right upper extremity normal range of motion stability strength and alignment skin normal normal distal pulses\ Left upper extremity normal range of motion stability strength and alignment skin normal normal distal pulses  Right lower extremity total knee incision anteriorly skin normal knee flexion 115 hip flexion normal alignment normal distal pulses normal stability hip knee and ankle normal  Left lower extremity lateral incision for total hip arthroplasty healed intact midline total knee incision healed intact knee flexion approximately 115 knee is stable  Left lower extremity: Left hip under anesthesia hip flexion 125. At 90 flexion 45 internal rotation 30 external rotation stable. In extension 5 50 extension stable. Sleep position stable. Push pull test normal. Muscle strength and muscle tone normal. Distal pulses intact. Alignment normal leg lengths equal   Vital signs in last 24 hours: Temp:  [98.3 F (36.8 C)] 98.3 F (36.8 C) (05/10 0649) Pulse Rate:  [71] 71 (05/10 0649) Resp:  [18] 18 (05/10 0649) BP: (151)/(79) 151/79 mmHg (05/10 0649) SpO2:  [93 %] 93 % (05/10 0649) Weight:  [250 lb (113.399 kg)] 250 lb (113.399 kg) (05/10 0649)   Labs:   Estimated body mass index is 41.6 kg/(m^2) as calculated from the  following:   Height as of this encounter: 5\' 5"  (1.651 m).   Weight as of this encounter: 250 lb (113.399 kg).  Imaging Review:  X-rays show 22 of anteversion and 58 of abduction of the cup no fracture is noted the stem is stable there is one screw in the cup   Assessment/Plan:  Recurrent dislocation left hip prosthesis  Plan for revision of acetabulum  The patient history, physical examination, clinical judgement of the provider and imaging studies are consistent with recurrent dislocation of the left hip(s), previous total hip arthroplasty. Revision total hip arthroplasty is deemed medically necessary. The treatment options including physical therapy, bracing have been discussed. The risks and benefits of revision total hip arthroplasty were presented and reviewed. The risks due to aseptic loosening, infection,  stiffness, dislocation/subluxation,  thromboembolic complications and other imponderables were discussed.  The patient acknowledged the explanation, agreed to proceed with the plan and consent was signed. Patient is being admitted for inpatient treatment for surgery, pain control, PT, OT, prophylactic antibiotics, VTE prophylaxis, progressive ambulation and ADL's and discharge planning. The patient is planning to be discharged to skilled nursing facility

## 2016-01-26 NOTE — Anesthesia Preprocedure Evaluation (Signed)
Anesthesia Evaluation  Patient identified by MRN, date of birth, ID band Patient awake    Reviewed: Allergy & Precautions, NPO status , Patient's Chart, lab work & pertinent test results  Airway Mallampati: II  TM Distance: >3 FB     Dental  (+) Edentulous Upper, Edentulous Lower   Pulmonary COPD, Current Smoker, former smoker,    breath sounds clear to auscultation       Cardiovascular hypertension, Pt. on medications  Rhythm:Regular Rate:Normal     Neuro/Psych  Neuromuscular disease    GI/Hepatic GERD  Medicated and Controlled,  Endo/Other  diabetes, Type 2  Renal/GU      Musculoskeletal  (+) Arthritis ,   Abdominal   Peds  Hematology   Anesthesia Other Findings   Reproductive/Obstetrics                             Anesthesia Physical Anesthesia Plan  ASA: III  Anesthesia Plan: Spinal   Post-op Pain Management:    Induction:   Airway Management Planned: Simple Face Mask  Additional Equipment:   Intra-op Plan:   Post-operative Plan:   Informed Consent: I have reviewed the patients History and Physical, chart, labs and discussed the procedure including the risks, benefits and alternatives for the proposed anesthesia with the patient or authorized representative who has indicated his/her understanding and acceptance.     Plan Discussed with:   Anesthesia Plan Comments: (Possible GOT-RSI explained to pt and she agrees with plan.)        Anesthesia Quick Evaluation

## 2016-01-26 NOTE — Anesthesia Procedure Notes (Addendum)
Spinal Patient location during procedure: OR Start time: 01/26/2016 7:59 AM Staffing Resident/CRNA: Tressie Stalker E Preanesthetic Checklist Completed: patient identified, site marked, surgical consent, pre-op evaluation, timeout performed, IV checked, risks and benefits discussed and monitors and equipment checked Spinal Block Patient position: left lateral decubitus Prep: Betadine Patient monitoring: heart rate, cardiac monitor, continuous pulse ox and blood pressure Approach: left paramedian Location: L3-4 Injection technique: single-shot Needle Needle type: Spinocan  Needle gauge: 22 G Needle length: 9 cm Assessment Sensory level: T8 Additional Notes  ATTEMPTS:1 TRAY MR:[id number] TRAY EXPIRATION DATE:03/2016

## 2016-01-26 NOTE — Evaluation (Signed)
Physical Therapy Evaluation Patient Details Name: [name] MRN: [id number] DOB: [birth year] Today's Date: 01/26/2016   History of Present Illness  [name] is a 65yo black female who presents after closed hip reduction under anesthesia. The pt had a L THA on 12/08/15 and DC to [facility] for 3 weeks. At home pt sustained a dislocation on 4/20 reduced in the ED, and another on 4/25 at home, shortly after followup visit with ortho.  Pt has since decided for L THA revision via lateral approach.  Prior to dislocations, pt was ambulating limited community distances with SPC, but had not began OPPT yet. PMH: dysphagia, COPD, ovarian CA, spinal stenosis with neurogenic claudication, DDD of lumbosacral spine, HNP lumbar, knee OA s/p B TKA, sciatica, DM, cardiovascular disease, HTN, abdominal surgery.   Clinical Impression  Pt received in bed, son present, and pt is agreeable to PT evaluation.  Pt states that prior to this surgery she was using a cane for ambulation, and was independent for ADL's, and IADL's.  Pt lives alone.  Today, pt was very lethargic after receiving pain medication, but she was able to complete total joint exercises, and required Min/Mod A for supine<>sit, and sit<>supine while ensuring that she maintain her precautions.  Handout given to remind pt of her precautions and exercises.  Recommend SNF due to living alone.     Follow Up Recommendations SNF    Equipment Recommendations       Recommendations for Other Services       Precautions / Restrictions Precautions Precautions: Fall;Other (comment) Precaution Comments: Lateral hip precautions including no abduction, no adduction, no extension, and no flexion >90*.  Abduction wedge at all times unless ambulating.  Restrictions Weight Bearing Restrictions: Yes LLE Weight Bearing: Weight bearing as tolerated      Mobility  Bed Mobility Overal bed mobility: Needs Assistance Bed Mobility: Supine to Sit;Sit to Supine      Supine to sit: Min assist;HOB elevated Sit to supine: Mod assist   General bed mobility comments: Pt required increased time to perform supine<>sit, educated on being aware of precautions during this transfer and to perform as if she was at home alone.  Pt required cues for technique, and hand placement.  After sitting on the EOB pt became dizzy, therefore, pt was assisted back into supine position with Mod A to lift LE's up into the bed.  Mod A for supine scoot towars the HOB.  Pt able to use R LE, and UE's on the bed rails to assist.   Transfers Overall transfer level:  (NT due to lethargy, as well as dizziness in sitting. )                  Ambulation/Gait                Stairs            Wheelchair Mobility    Modified Rankin (Stroke Patients Only)       Balance   Sitting-balance support: Bilateral upper extremity supported;Feet supported Sitting balance-Leahy Scale: Good                                       Pertinent Vitals/Pain Pain Assessment: 0-10 Pain Score: 5  Pain Location: L hip Pain Descriptors / Indicators: Aching Pain Intervention(s): Premedicated before session;Limited activity within patient's tolerance;Relaxation;Utilized relaxation techniques    Home Living Family/patient expects  to be discharged to:: Skilled nursing facility Living Arrangements: Alone   Type of Home: House Home Access: Level entry     Home Layout: [place]: [place] - 2 wheels;Cane - single point;Bedside commode;Shower seat;Other (comment) Additional Comments: Pt states she also has a hip kit    Prior Function Level of Independence: Independent with assistive device(s)   Gait / Transfers Assistance Needed: Pt was ambulating with a cane  ADL's / Homemaking Assistance Needed: Pt states she was able to get herself dresed and bathed.         Hand Dominance        Extremity/Trunk Assessment   Upper Extremity Assessment:  Generalized weakness           Lower Extremity Assessment: Generalized weakness         Communication   Communication: No difficulties  Cognition Arousal/Alertness: Lethargic (Pt falling asleep at times during education and questions) Behavior During Therapy: [facility] for tasks assessed/performed Overall Cognitive Status: Within Functional Limits for tasks assessed                      General Comments      Exercises Total Joint Exercises Ankle Circles/Pumps: AROM;Both;10 reps;Supine Quad Sets: Strengthening;Both;10 reps;Supine Gluteal Sets: Strengthening;Both;10 reps;Supine Short Arc Quad: Strengthening;Left;10 reps;Supine Heel Slides: Strengthening;Both;10 reps;Supine;Other (comment) (Min A provided for this exercise. )      Assessment/Plan    PT Assessment Patient needs continued PT services  PT Diagnosis Difficulty walking;Abnormality of gait;Generalized weakness   PT Problem List Decreased strength;Decreased range of motion;Decreased activity tolerance;Decreased balance;Decreased mobility;Decreased knowledge of use of DME;Decreased safety awareness;Decreased knowledge of precautions;Obesity;Pain  PT Treatment Interventions DME instruction;Gait training;Functional mobility training;Therapeutic activities;Therapeutic exercise;Balance training;Patient/family education   PT Goals (Current goals can be found in the Care Plan section) Acute Rehab PT Goals Patient Stated Goal: Improve functional mobility. Decrease pain.  PT Goal Formulation: With patient Time For Goal Achievement: 02/02/16 Potential to Achieve Goals: Good    Frequency BID   Barriers to discharge Decreased caregiver support      Co-evaluation               End of Session   Activity Tolerance: Patient limited by pain;Patient tolerated treatment well Patient left: in bed;with call bell/phone within reach;with bed alarm set;with family/visitor present Nurse Communication: Mobility status     Functional Assessment Tool Used: [place] "6-clicks" Functional Limitation: Mobility: Walking and moving around Mobility: Walking and Moving Around Current Status [phone]): At least 60 percent but less than 80 percent impaired, limited or restricted Mobility: Walking and Moving Around Goal Status [phone]): At least 40 percent but less than 60 percent impaired, limited or restricted    Time: 1535-1600 PT Time Calculation (min) (ACUTE ONLY): 25 min   Charges:   PT Evaluation $PT Eval Low Complexity: 1 Procedure PT Treatments $Therapeutic Exercise: 8-22 mins   PT G Codes:   PT G-Codes **NOT FOR INPATIENT CLASS** Functional Assessment Tool Used: [employer] "6-clicks" Functional Limitation: Mobility: Walking and moving around Mobility: Walking and Moving Around Current Status [phone]): At least 60 percent but less than 80 percent impaired, limited or restricted Mobility: Walking and Moving Around Goal Status [phone]): At least 40 percent but less than 60 percent impaired, limited or restricted    Tacy Learn, PT, DPT X: [id number]   01/26/2016, 4:39 PM

## 2016-01-26 NOTE — Anesthesia Postprocedure Evaluation (Signed)
Anesthesia Post Note  Patient: [name]  Procedure(s) Performed: Procedure(s) (LRB): LEFT TOTAL HIP REVISION (Left)  Patient location during evaluation: PACU Anesthesia Type: Spinal Level of consciousness: awake and alert Pain management: pain level controlled Vital Signs Assessment: post-procedure vital signs reviewed and stable Respiratory status: spontaneous breathing Cardiovascular status: blood pressure returned to baseline Postop Assessment: no signs of nausea or vomiting Anesthetic complications: no    Last Vitals:  Filed Vitals:   01/26/16 1105 01/26/16 1115  BP: 94/54 112/58  Pulse: 61 50  Temp: 36.5 C   Resp: 22 27    Last Pain:  Filed Vitals:   01/26/16 1118  PainSc: 3                  Anwen Cannedy

## 2016-01-27 ENCOUNTER — Encounter (HOSPITAL_COMMUNITY): Payer: Self-pay | Admitting: Orthopedic Surgery

## 2016-01-27 LAB — GLUCOSE, CAPILLARY
GLUCOSE-CAPILLARY: 121 mg/dL — AB (ref 65–99)
GLUCOSE-CAPILLARY: 168 mg/dL — AB (ref 65–99)
GLUCOSE-CAPILLARY: 211 mg/dL — AB (ref 65–99)
GLUCOSE-CAPILLARY: 267 mg/dL — AB (ref 65–99)
Glucose-Capillary: 146 mg/dL — ABNORMAL HIGH (ref 65–99)
Glucose-Capillary: 140 mg/dL — ABNORMAL HIGH (ref 65–99)

## 2016-01-27 LAB — CBC
HEMATOCRIT: 28.4 % — AB (ref 36.0–46.0)
HEMOGLOBIN: 9.3 g/dL — AB (ref 12.0–15.0)
MCH: 30.3 pg (ref 26.0–34.0)
MCHC: 32.7 g/dL (ref 30.0–36.0)
MCV: 92.5 fL (ref 78.0–100.0)
Platelets: 261 10*3/uL (ref 150–400)
RBC: 3.07 MIL/uL — AB (ref 3.87–5.11)
RDW: 14.2 % (ref 11.5–15.5)
WBC: 8.4 10*3/uL (ref 4.0–10.5)

## 2016-01-27 LAB — TYPE AND SCREEN
ABO/RH(D): AB POS
Antibody Screen: NEGATIVE
UNIT DIVISION: 0
UNIT DIVISION: 0
UNIT DIVISION: 0

## 2016-01-27 LAB — BASIC METABOLIC PANEL
ANION GAP: 7 (ref 5–15)
BUN: 22 mg/dL — ABNORMAL HIGH (ref 6–20)
CHLORIDE: 103 mmol/L (ref 101–111)
CO2: 27 mmol/L (ref 22–32)
Calcium: 8.3 mg/dL — ABNORMAL LOW (ref 8.9–10.3)
Creatinine, Ser: 1.55 mg/dL — ABNORMAL HIGH (ref 0.44–1.00)
GFR calc non Af Amer: 34 mL/min — ABNORMAL LOW (ref >60)
GFR, EST AFRICAN AMERICAN: 39 mL/min — AB (ref >60)
Glucose, Bld: 149 mg/dL — ABNORMAL HIGH (ref 65–99)
Potassium: 3.6 mmol/L (ref 3.5–5.1)
SODIUM: 137 mmol/L (ref 135–145)

## 2016-01-27 MED ORDER — INSULIN ASPART 100 UNIT/ML ~~LOC~~ SOLN
0.0000 [IU] | Freq: Three times a day (TID) | SUBCUTANEOUS | Status: DC
Start: 2016-01-27 — End: 2016-01-29
  Administered 2016-01-27: 4 [IU] via SUBCUTANEOUS
  Administered 2016-01-27: 8 [IU] via SUBCUTANEOUS
  Administered 2016-01-28: 2 [IU] via SUBCUTANEOUS
  Administered 2016-01-28: 4 [IU] via SUBCUTANEOUS
  Administered 2016-01-28: 8 [IU] via SUBCUTANEOUS
  Administered 2016-01-29: 2 [IU] via SUBCUTANEOUS
  Administered 2016-01-29: 4 [IU] via SUBCUTANEOUS

## 2016-01-27 MED ORDER — INSULIN ASPART 100 UNIT/ML ~~LOC~~ SOLN
2.0000 [IU] | Freq: Every day | SUBCUTANEOUS | Status: DC
  Administered 2016-01-27: 3 [IU] via SUBCUTANEOUS

## 2016-01-27 NOTE — Progress Notes (Addendum)
Physical Therapy Treatment Patient Details Name: [name] MRN: [id number] DOB: 12-09-1950 Today's Date: 01/27/2016    History of Present Illness [name] is a 65yo black female who presents after closed hip reduction under anesthesia. The pt had a L THA on 12/08/15 and DC to [facility] for 3 weeks. At home pt sustained a dislocation on 4/20 reduced in the ED, and another on 4/25 at home, shortly after followup visit with ortho.  Pt has since decided for L THA revision via lateral approach.  Prior to dislocations, pt was ambulating limited community distances with SPC, but had not began OPPT yet. PMH: dysphagia, COPD, ovarian CA, spinal stenosis with neurogenic claudication, DDD of lumbosacral spine, HNP lumbar, knee OA s/p B TKA, sciatica, DM, cardiovascular disease, HTN, abdominal surgery.     PT Comments    Pt received sitting up in the chair, and was agreeable to PT tx.  Pt expressed that it felt like she had been sitting up in the chair longer than an hour.  Pt was able to improve gait distance to 82ft with RW and Min A.  She continues to have difficulty with L LE advancement.  Pt assisted back into the bed with Min A.  Encouraged pt to perform HEP on her own tonight.  Continue to recommend SNF, however pt expressed concerns about having a co-pay for each day she is there.  Discussed option of going home, and stated that she would need strict 24/7 assistance at home to assist her with ADL's.  Pt stated that she does not have anyone who can do that.   Follow Up Recommendations  SNF     Equipment Recommendations  None recommended by PT    Recommendations for Other Services       Precautions / Restrictions Precautions Precautions: Fall;Other (comment) Precaution Comments: Lateral hip precautions including no abduction, no adduction, no extension, and no flexion >90*.  Abduction wedge at all times unless ambulating.  - Pt only able to verbalize 2/4 precautions today.   Restrictions LLE Weight Bearing: Weight bearing as tolerated    Mobility  Bed Mobility Overal bed mobility: Needs Assistance Bed Mobility: Sit to Supine     Supine to sit: Mod assist;HOB elevated Sit to supine: Min assist   General bed mobility comments: L LE assisted back up into the bed.   Transfers Overall transfer level: Needs assistance Equipment used: Rolling walker (2 wheeled) Transfers: Sit to/from Stand Sit to Stand: Min guard         General transfer comment: from chair and from elevated toilet.    Ambulation/Gait Ambulation/Gait assistance: Min assist Ambulation Distance (Feet): 20 Feet Assistive device: Rolling walker (2 wheeled) Gait Pattern/deviations: Step-to pattern;Decreased step length - left;Decreased step length - right;Antalgic     General Gait Details: Pt continues with difficulty to advance L LE, but improved from AM session.  Decreased cadence, and continued step to pattern due to increased pain with weight bearing. Pt was able to ambulate from the chair <> bathroom toilet, and attempted to void, but unsuccessful. Pt then ambulated back to the bed.    Stairs            Wheelchair Mobility    Modified Rankin (Stroke Patients Only)       Balance Overall balance assessment: Modified Independent   Sitting balance-Leahy Scale: Good     Standing balance support: Bilateral upper extremity supported Standing balance-Leahy Scale: Fair  Cognition Arousal/Alertness: Awake/alert Behavior During Therapy: WFL for tasks assessed/performed Overall Cognitive Status: Within Functional Limits for tasks assessed       Memory: Decreased recall of precautions              Exercises Total Joint Exercises Ankle Circles/Pumps: Strengthening;Both;10 reps;Supine Quad Sets: Strengthening;Both;10 reps;Supine Gluteal Sets: Strengthening;Both;10 reps;Supine Heel Slides: Strengthening;Left;10 reps;Supine (limited  range due to combination of pain and weakness. )    General Comments        Pertinent Vitals/Pain Pain Assessment:  (Pt reports that her pain is improved, but did not give a number. ) Pain Score: 8  Pain Location: L hip Pain Intervention(s): Patient requesting pain meds-RN notified;Limited activity within patient's tolerance    Home Living                      Prior Function            PT Goals (current goals can now be found in the care plan section) Acute Rehab PT Goals Patient Stated Goal: Improve functional mobility. Decrease pain.  PT Goal Formulation: With patient Time For Goal Achievement: 02/02/16 Potential to Achieve Goals: Good Progress towards PT goals: Progressing toward goals    Frequency  BID    PT Plan Current plan remains appropriate    Co-evaluation             End of Session Equipment Utilized During Treatment: Gait belt Activity Tolerance: Patient tolerated treatment well Patient left: in bed;with call bell/phone within reach     Time: 1240-1315 PT Time Calculation (min) (ACUTE ONLY): 35 min  Charges:  $Gait Training: 8-22 mins $Therapeutic Exercise: 8-22 mins $Therapeutic Activity: 8-22 mins                    G Codes:      Beth Kash [place], PT, DPT X: [id number]   01/27/2016, 1:36 PM

## 2016-01-27 NOTE — Clinical Social Work Note (Signed)
Clinical Social Work Assessment  Patient Details  Name: [name] MRN: [id number] Date of Birth: [birth year]  Date of referral:  01/27/16               Reason for consult:  Discharge Planning                Permission sought to share information with:    Permission granted to share information::     Name::        Agency::     Relationship::     Contact Information:     Housing/Transportation Living arrangements for the past 2 months:  Single Family Home Source of Information:  Patient Patient Interpreter Needed:  None Criminal Activity/Legal Involvement Pertinent to Current Situation/Hospitalization:  No - Comment as needed Significant Relationships:  Adult Children Lives with:  Self Do you feel safe going back to the place where you live?  Yes Need for family participation in patient care:  Yes (Comment)  Care giving concerns:  Pt lives alone.    Social Worker assessment / plan:  CSW met with pt at bedside. Pt reports she lives alone, but has excellent support from her son who visits daily. She indicates she had hip surgery in March and was doing fairly well at home after completing rehab. At the end of April, she dislocated her hip and had total hip revision yesterday. PT recommending return to SNF. CSW discussed this with pt who is agreeable. Pt understands that she may be in copay days. She requests to return to [facility] if possible.   Employment status:  Retired Nurse, adult PT Recommendations:  [place] / Referral to community resources:  [place]  Patient/Family's Response to care:  Pt requests return to SNF for rehab.   Patient/Family's Understanding of and Emotional Response to Diagnosis, Current Treatment, and Prognosis:  Pt is aware of treatment plan and has been through this before.   Emotional Assessment Appearance:  Appears stated age Attitude/Demeanor/Rapport:  Other (Pleasant) Affect  (typically observed):  Appropriate Orientation:  Oriented to Self, Oriented to Place, Oriented to  Time, Oriented to Situation Alcohol / Substance use:  Not Applicable Psych involvement (Current and /or in the community):  No (Comment)  Discharge Needs  Concerns to be addressed:  Discharge Planning Concerns Readmission within the last 30 days:  Yes Current discharge risk:  Lives alone Barriers to Discharge:  Continued Medical Work up   [employer], [employer], [place] 01/27/2016, 9:18 AM [phone]

## 2016-01-27 NOTE — NC FL2 (Signed)
[place] MEDICAID FL2 LEVEL OF CARE SCREENING TOOL     IDENTIFICATION  Patient Name: [name] Birthdate: [birth year] Sex: female Admission Date (Current Location): 01/26/2016  [facility] [place] Number:  [employer] and Address:  [facility] [address], [place]      Provider Number: [id number]  Attending Physician Name and Address:  Carole Civil, MD  Relative Name and Phone Number:       Current Level of Care: Hospital Recommended Level of Care: [place] Prior Approval Number:    Date Approved/Denied:   PASRR Number: [id number]  Discharge Plan: SNF    Current Diagnoses: Patient Active Problem List   Diagnosis Date Noted  . Failed total hip arthroplasty with dislocation ([place]) 01/26/2016  . Recurrent dislocation of left hip   . Dislocation of hip prosthesis ([place]) 01/15/2016  . Mechanical complication of internal orthopedic device ([place])   . Cough 12/17/2015  . Osteoarthritis of left hip   . Dysphagia 04/02/2014  . Unspecified constipation 04/02/2014  . GERD (gastroesophageal reflux disease) 04/02/2013  . Chronic bronchitis with acute exacerbation ([place]) 04/02/2013  . History of ovarian cancer 04/02/2013  . Spinal stenosis, lumbar region, with neurogenic claudication 02/27/2013  . Iliotibial band tendonitis 02/27/2013  . DEGENERATIVE DISC DISEASE, LUMBOSACRAL SPINE 12/09/2009  . H N P-LUMBAR 09/29/2009  . KNEE, ARTHRITIS, DEGEN./OSTEO 09/23/2008  . SCIATICA 05/14/2008  . Insulin dependent diabetes mellitus ([place]) 05/12/2008  . History of cardiovascular disorder 05/12/2008    Orientation RESPIRATION BLADDER Height & Weight     Self, Time, Situation, Place  Normal Continent Weight: 250 lb (113.399 kg) Height:  5\' 5"  (165.1 cm)  BEHAVIORAL SYMPTOMS/MOOD NEUROLOGICAL BOWEL NUTRITION STATUS  Other (Comment) (n/a)  (n/a) Continent Diet (Carb modified)  AMBULATORY STATUS COMMUNICATION OF NEEDS  Skin   Extensive Assist Verbally Surgical wounds                       Personal Care Assistance Level of Assistance  Bathing, Feeding, Dressing Bathing Assistance: Maximum assistance Feeding assistance: Independent Dressing Assistance: Maximum assistance     Functional Limitations Info  Sight, Hearing, Speech Sight Info: Adequate Hearing Info: Adequate Speech Info: Adequate    SPECIAL CARE FACTORS FREQUENCY  PT (By licensed PT)     PT Frequency: 5              Contractures Contractures Info: Not present    Additional Factors Info  Insulin Sliding Scale   Allergies Info: Gabapentin           Current Medications (01/27/2016):  This is the current hospital active medication list Current Facility-Administered Medications  Medication Dose Route Frequency Provider Last Rate Last Dose  . 0.9 %  sodium chloride infusion   Intravenous Continuous Carole Civil, MD 100 mL/hr at 01/26/16 1445    . acetaminophen (TYLENOL) tablet 1,000 mg  1,000 mg Oral Q6H Carole Civil, MD   1,000 mg at 01/27/16 0653  . albuterol (PROVENTIL) (2.5 MG/3ML) 0.083% nebulizer solution 3 mL  3 mL Nebulization Q6H PRN Carole Civil, MD      . alum & mag hydroxide-simeth (MAALOX/MYLANTA) 200-200-20 MG/5ML suspension 30 mL  30 mL Oral Q4H PRN Carole Civil, MD      . aspirin EC tablet 325 mg  325 mg Oral BID Carole Civil, MD   325 mg at 01/27/16 0946  . atenolol (TENORMIN) tablet 50 mg  50 mg Oral Daily Carole Civil, MD   50 mg at 01/27/16 0946  . calcium-vitamin D (OSCAL WITH D) 500-200 MG-UNIT per tablet 1 tablet  1 tablet Oral Daily Carole Civil, MD   1 tablet at 01/27/16 [phone]  . cholecalciferol (VITAMIN D) tablet 1,000 Units  1,000 Units Oral Daily Carole Civil, MD   1,000 Units at 01/27/16 [phone]  . diphenhydrAMINE (BENADRYL) 12.5 MG/5ML elixir 12.5-25 mg  12.5-25 mg Oral Q4H PRN Carole Civil, MD      . docusate sodium (COLACE) capsule 100 mg  100  mg Oral BID Carole Civil, MD   100 mg at 01/27/16 0943  . HYDROmorphone (DILAUDID) injection 0.5 mg  0.5 mg Intravenous Q2H PRN Carole Civil, MD   0.5 mg at 01/26/16 1503  . insulin aspart (novoLOG) injection 0-24 Units  0-24 Units Subcutaneous TID WC Carole Civil, MD      . insulin aspart (novoLOG) injection 2-5 Units  2-5 Units Subcutaneous QHS Carole Civil, MD      . insulin NPH Human (HUMULIN N,NOVOLIN N) injection 30 Units  30 Units Subcutaneous QAC breakfast Carole Civil, MD   30 Units at 01/27/16 [phone]  . ketorolac (TORADOL) 15 MG/ML injection 15 mg  15 mg Intravenous Q6H Carole Civil, MD   15 mg at 01/27/16 0653  . magnesium citrate solution 1 Bottle  1 Bottle Oral Once PRN Carole Civil, MD      . menthol-cetylpyridinium (CEPACOL) lozenge 3 mg  1 lozenge Oral PRN Carole Civil, MD       Or  . phenol (CHLORASEPTIC) mouth spray 1 spray  1 spray Mouth/Throat PRN Carole Civil, MD      . methocarbamol (ROBAXIN) tablet 500 mg  500 mg Oral Q6H PRN Carole Civil, MD       Or  . methocarbamol (ROBAXIN) 500 mg in dextrose 5 % 50 mL IVPB  500 mg Intravenous Q6H PRN Carole Civil, MD      . metoCLOPramide (REGLAN) tablet 5-10 mg  5-10 mg Oral Q8H PRN Carole Civil, MD       Or  . metoCLOPramide (REGLAN) injection 5-10 mg  5-10 mg Intravenous Q8H PRN Carole Civil, MD      . ondansetron [facility]) tablet 4 mg  4 mg Oral Q6H PRN Carole Civil, MD       Or  . ondansetron [facility]) injection 4 mg  4 mg Intravenous Q6H PRN Carole Civil, MD      . oxyCODONE-acetaminophen (PERCOCET/ROXICET) 5-325 MG per tablet 1 tablet  1 tablet Oral Q4H Carole Civil, MD   1 tablet at 01/27/16 0654  . polyethylene glycol (MIRALAX / GLYCOLAX) packet 17 g  17 g Oral Daily PRN Carole Civil, MD      . polyethylene glycol (MIRALAX / GLYCOLAX) packet 17 g  17 g Oral Daily Carole Civil, MD   17 g at 01/27/16 0941  .  triamterene-hydrochlorothiazide (MAXZIDE-25) 37.5-25 MG per tablet 1 tablet  1 tablet Oral Daily Carole Civil, MD   1 tablet at 01/27/16 [phone]  . verapamil (CALAN-SR) CR tablet 240 mg  240 mg Oral Daily Carole Civil, MD   240 mg at 01/27/16 [id number]     Discharge Medications: Please see discharge summary for a list of discharge medications.  Relevant Imaging Results:  Relevant Lab Results:   Additional Information SS#: [id number]  Benay Pike [place], [place]

## 2016-01-27 NOTE — Progress Notes (Signed)
Physical Therapy Treatment Patient Details Name: [name] MRN: [id number] DOB: Oct 19, 1950 Today's Date: 01/27/2016    History of Present Illness [name] is a 65yo black female who presents after closed hip reduction under anesthesia. The pt had a L THA on 12/08/15 and DC to [facility] for 3 weeks. At home pt sustained a dislocation on 4/20 reduced in the ED, and another on 4/25 at home, shortly after followup visit with ortho.  Pt has since decided for L THA revision via lateral approach.  Prior to dislocations, pt was ambulating limited community distances with SPC, but had not began OPPT yet. PMH: dysphagia, COPD, ovarian CA, spinal stenosis with neurogenic claudication, DDD of lumbosacral spine, HNP lumbar, knee OA s/p B TKA, sciatica, DM, cardiovascular disease, HTN, abdominal surgery.     PT Comments    Pt received in bed, and was agreeable to PT tx.  Pt was able to complete LE exercises, however still limited with heel slide due to pain and decreased strength.  Pt required Mod A for supine<>sit transfer today due to increased pain.  She was able to stand with RW and Min A.  During transfer bed<>chair with RW, she required Mod A due to pain and increased difficulty to advance L LE fwd, which required assistance from PT to do so.  RN located at end of tx, and pain medication was requested.  Continue to recommend SNF.   Follow Up Recommendations  SNF     Equipment Recommendations  None recommended by PT    Recommendations for Other Services       Precautions / Restrictions Precautions Precautions: Fall;Other (comment) Precaution Comments: Lateral hip precautions including no abduction, no adduction, no extension, and no flexion >90*.  Abduction wedge at all times unless ambulating.  - Pt only able to verbalize 2/4 precautions today.  Restrictions LLE Weight Bearing: Weight bearing as tolerated    Mobility  Bed Mobility Overal bed mobility: Needs Assistance Bed Mobility:  Supine to Sit     Supine to sit: Mod assist;HOB elevated     General bed mobility comments: hip abduction pillow in place during transfer to ensure no adduction past midline.  Pt required significant increased time to perform transfer with assistance to advance L LE off the EOB, and assistance to lift trunk up off the bed.    Transfers Overall transfer level: Needs assistance Equipment used: Rolling walker (2 wheeled) Transfers: Sit to/from Stand Sit to Stand: From elevated surface;Min assist            Ambulation/Gait Ambulation/Gait assistance: Mod assist Ambulation Distance (Feet): 2 Feet Assistive device: Rolling walker (2 wheeled) Gait Pattern/deviations: Step-to pattern;Decreased step length - left;Decreased step length - right;Decreased stance time - left;Antalgic     General Gait Details: Pt has difficulty with L hip/knee flexion required for L LE advancement during gait.  Pt tends to inch toes/foot fwd.  Encouraged pt with weight shift to the right to de-weight the left, and required assistance at times to advance L foot fwd.  Pt was only able to take a few steps to turn and sit in the chair.  Further gait limited due to increased pain.     Stairs            Wheelchair Mobility    Modified Rankin (Stroke Patients Only)       Balance Overall balance assessment: Modified Independent         Standing balance support: Bilateral upper extremity supported Standing  balance-Leahy Scale: Fair                      Cognition Arousal/Alertness: Awake/alert Behavior During Therapy: WFL for tasks assessed/performed Overall Cognitive Status: Within Functional Limits for tasks assessed       Memory: Decreased recall of precautions              Exercises Total Joint Exercises Ankle Circles/Pumps: Strengthening;Both;10 reps;Supine Quad Sets: Strengthening;Both;10 reps;Supine Gluteal Sets: Strengthening;Both;10 reps;Supine Heel Slides:  Strengthening;Left;10 reps;Supine (limited range due to combination of pain and weakness. )    General Comments        Pertinent Vitals/Pain Pain Assessment: 0-10 Pain Score: 8  Pain Location: L hip Pain Intervention(s): Patient requesting pain meds-RN notified;Limited activity within patient's tolerance    Home Living                      Prior Function            PT Goals (current goals can now be found in the care plan section) Acute Rehab PT Goals Patient Stated Goal: Improve functional mobility. Decrease pain.  PT Goal Formulation: With patient Time For Goal Achievement: 02/02/16 Potential to Achieve Goals: Good Progress towards PT goals: Progressing toward goals    Frequency  BID    PT Plan Current plan remains appropriate    Co-evaluation             End of Session Equipment Utilized During Treatment: Gait belt Activity Tolerance: Patient limited by pain Patient left: in chair;with call bell/phone within reach;with nursing/sitter in room     Time: [id number] PT Time Calculation (min) (ACUTE ONLY): 34 min  Charges:  $Therapeutic Exercise: 8-22 mins $Therapeutic Activity: 8-22 mins                    G Codes:      Beth Aime Carreras, PT, DPT X: [id number]   01/27/2016, 12:15 PM

## 2016-01-27 NOTE — Progress Notes (Signed)
Patient ID: [name], female   DOB: 1951/04/22, 65 y.o.   MRN: [id number]  POD # 1  Status post left total hip revision  VS BP 72/29 mmHg  Pulse 74  Temp(Src) 98.2 F (36.8 C) (Oral)  Resp 20  Ht 5\' 5"  (1.651 m)  Wt 250 lb (113.399 kg)  BMI 41.60 kg/m2  SpO2 91%   LABS  BMP Latest Ref Rng 01/27/2016 01/24/2016 01/15/2016  Glucose 65 - 99 mg/dL 149(H) 229(H) 150(H)  BUN 6 - 20 mg/dL 22(H) 16 19  Creatinine 0.44 - 1.00 mg/dL 1.55(H) 1.06(H) 1.10(H)  Sodium 135 - 145 mmol/L 137 136 137  Potassium 3.5 - 5.1 mmol/L 3.6 3.8 3.5  Chloride 101 - 111 mmol/L 103 100(L) 97(L)  CO2 22 - 32 mmol/L 27 24 -  Calcium 8.9 - 10.3 mg/dL 8.3(L) 9.4 -    CBC Latest Ref Rng 01/27/2016 01/26/2016 01/24/2016  WBC 4.0 - 10.5 K/uL 8.4 - 9.4  Hemoglobin 12.0 - 15.0 g/dL 9.3(L) 10.5(L) 11.8(L)  Hematocrit 36.0 - 46.0 % 28.4(L) 32.1(L) 35.6(L)  Platelets 150 - 400 K/uL 261 - 375   DRESSING scant drainage   Neuro-vasculo-motor status of operative limb normal sensation, normal foot DF  Homans sign normal calf supple  Assessment status post total knee arthroplasty patient doing well  Plan continue physical therapy. Provide adequate pain control.

## 2016-01-27 NOTE — Addendum Note (Signed)
Addendum  created 01/27/16 1056 by Vista Deck, CRNA   Modules edited: Notes Section   Notes Section:  File: [id number]

## 2016-01-27 NOTE — Anesthesia Postprocedure Evaluation (Signed)
Anesthesia Post Note  Patient: [name]  Procedure(s) Performed: Procedure(s) (LRB): LEFT TOTAL HIP REVISION (Left)  Patient location during evaluation: Nursing Unit Anesthesia Type: Spinal Level of consciousness: awake and alert Pain management: satisfactory to patient Vital Signs Assessment: post-procedure vital signs reviewed and stable Respiratory status: spontaneous breathing Postop Assessment: no headache Anesthetic complications: no    Last Vitals:  Filed Vitals:   01/27/16 0415 01/27/16 0941  BP: 72/29 124/70  Pulse: 74   Temp: 36.8 C   Resp: 20     Last Pain:  Filed Vitals:   01/27/16 0946  PainSc: 3                  Irving Bloor

## 2016-01-27 NOTE — Clinical Social Work Placement (Signed)
   CLINICAL SOCIAL WORK PLACEMENT  NOTE  Date:  01/27/2016  Patient Details  Name: [name] MRN: [id number] Date of Birth: Feb 22, 1951  Clinical Social Work is seeking post-discharge placement for this patient at the [place] level of care (*CSW will initial, date and re-position this form in  chart as items are completed):  Yes   Patient/family provided with [facility] Work Department's list of facilities offering this level of care within the geographic area requested by the patient (or if unable, by the patient's family).  Yes   Patient/family informed of their freedom to choose among providers that offer the needed level of care, that participate in Medicare, Medicaid or managed care program needed by the patient, have an available bed and are willing to accept the patient.  Yes   Patient/family informed of [facility]'s ownership interest in [facility] and [facility], as well as of the fact that they are under no obligation to receive care at these facilities.  PASRR submitted to EDS on       PASRR number received on       Existing PASRR number confirmed on 01/27/16     FL2 transmitted to all facilities in geographic area requested by pt/family on 01/27/16     FL2 transmitted to all facilities within larger geographic area on       Patient informed that his/her managed care company has contracts with or will negotiate with certain facilities, including the following:        Yes   Patient/family informed of bed offers received.  Patient [facility] bed at [facility]     Physician recommends and patient chooses bed at      Patient to be transferred to [facility] on  .  Patient to be transferred to facility by       Patient family notified on   of transfer.  Name of family member notified:        PHYSICIAN       Additional Comment:    _______________________________________________ Salome Arnt, [place] 01/27/2016, 10:57 AM [phone]

## 2016-01-27 NOTE — Progress Notes (Signed)
OT Screen  Patient Details Name: [name] MRN: [id number] DOB: [birth year]   OT Screen:      Reason Eval/Treat Not Completed: OT screened, no needs identified, will sign off. Pt has all DME and AE from original THA on 12/08/15 including cane, walker, tub bench, hand held shower head, reacher, sock aid. Pt verbalized all instructions for technique and use of AE for dressing and bathing tasks, also verbalized hip precautions to OT when questioned. UB strength assessed, BUE are 4+/5.  Patient would benefit from OT evaluation at SNF to determine need for ADL re-training in preparation to return home at highest level of safety and independence.   [name], OTR/L  [phone]  01/27/2016, 8:54 AM

## 2016-01-27 NOTE — Care Management Note (Signed)
Case Management Note  Patient Details  Name: [name] MRN: [id number] Date of Birth: March 17, 1951  Subjective/Objective:                    Action/Plan: Discharge to SNF , CSW following.   Expected Discharge Date:                  Expected Discharge Plan:  Skilled Nursing Facility  In-House Referral:  Clinical Social Work  Discharge planning Services  CM Consult  Post Acute Care Choice:    Choice offered to:     DME Arranged:    DME Agency:     HH Arranged:    [place] Agency:     Status of Service:  Completed, signed off  Medicare Important Message Given:    Date Medicare IM Given:    Medicare IM give by:    Date Additional Medicare IM Given:    Additional Medicare Important Message give by:     If discussed at [place] of Stay Meetings, dates discussed:    Additional Comments:  Alvie Heidelberg, RN 01/27/2016, 2:48 PM

## 2016-01-27 NOTE — Clinical Social Work Placement (Signed)
   CLINICAL SOCIAL WORK PLACEMENT  NOTE  Date:  01/27/2016  Patient Details  Name: [name] MRN: [id number] Date of Birth: 1950-09-25  Clinical Social Work is seeking post-discharge placement for this patient at the [place] level of care (*CSW will initial, date and re-position this form in  chart as items are completed):  Yes   Patient/family provided with [place] Work Department's list of facilities offering this level of care within the geographic area requested by the patient (or if unable, by the patient's family).  Yes   Patient/family informed of their freedom to choose among providers that offer the needed level of care, that participate in Medicare, Medicaid or managed care program needed by the patient, have an available bed and are willing to accept the patient.  Yes   Patient/family informed of [facility]'s ownership interest in [facility] and [facility], as well as of the fact that they are under no obligation to receive care at these facilities.  PASRR submitted to EDS on       PASRR number received on       Existing PASRR number confirmed on 01/27/16     FL2 transmitted to all facilities in geographic area requested by pt/family on 01/27/16     FL2 transmitted to all facilities within larger geographic area on       Patient informed that his/her managed care company has contracts with or will negotiate with certain facilities, including the following:            Patient/family informed of bed offers received.  Patient chooses bed at       Physician recommends and patient chooses bed at      Patient to be transferred to   on  .  Patient to be transferred to facility by       Patient family notified on   of transfer.  Name of family member notified:        PHYSICIAN       Additional Comment:    _______________________________________________ Salome Arnt, [place] 01/27/2016, 9:12  AM [phone]

## 2016-01-28 LAB — GLUCOSE, CAPILLARY
GLUCOSE-CAPILLARY: 159 mg/dL — AB (ref 65–99)
GLUCOSE-CAPILLARY: 195 mg/dL — AB (ref 65–99)
Glucose-Capillary: 192 mg/dL — ABNORMAL HIGH (ref 65–99)
Glucose-Capillary: 240 mg/dL — ABNORMAL HIGH (ref 65–99)

## 2016-01-28 LAB — CBC
HEMATOCRIT: 27.9 % — AB (ref 36.0–46.0)
Hemoglobin: 9.3 g/dL — ABNORMAL LOW (ref 12.0–15.0)
MCH: 30.4 pg (ref 26.0–34.0)
MCHC: 33.3 g/dL (ref 30.0–36.0)
MCV: 91.2 fL (ref 78.0–100.0)
Platelets: 284 10*3/uL (ref 150–400)
RBC: 3.06 MIL/uL — AB (ref 3.87–5.11)
RDW: 13.9 % (ref 11.5–15.5)
WBC: 11.8 10*3/uL — AB (ref 4.0–10.5)

## 2016-01-28 MED ORDER — LISINOPRIL 10 MG PO TABS
10.0000 mg | ORAL_TABLET | Freq: Every evening | ORAL | Status: DC
Start: 2016-01-28 — End: 2016-01-29
  Administered 2016-01-28 – 2016-01-29 (×2): 10 mg via ORAL
  Filled 2016-01-28 (×2): qty 1

## 2016-01-28 NOTE — Clinical Social Work Note (Signed)
CSW notified [facility] of plan for d/c to SNF Saturday. Facility agreeable.   Benay Pike, [place]

## 2016-01-28 NOTE — Progress Notes (Signed)
Inpatient Diabetes Program Recommendations  AACE/ADA: New Consensus Statement on Inpatient Glycemic Control (2015)  Target Ranges:  Prepandial:   less than 140 mg/dL      Peak postprandial:   less than 180 mg/dL (1-2 hours)      Critically ill patients:  140 - 180 mg/dL   Results for TANESIA, GLADFELTER (MRN [id number]) as of 01/28/2016 07:51  Ref. Range 01/27/2016 07:53 01/27/2016 11:21 01/27/2016 16:49 01/27/2016 20:28  Glucose-Capillary Latest Ref Range: 65-99 mg/dL 140 (H) 168 (H) 211 (H) 267 (H)   Review of Glycemic Control  Diabetes history: DM 2 Outpatient Diabetes medications: NPH 30 units Daily, Novolin R 10 units TID with meals Current orders for Inpatient glycemic control: NPH 30 units Daily, Novolog 0-24 units TID + Novolog 2-5 units QHS  Inpatient Diabetes Program Recommendations: Insulin - Meal Coverage: Glucose trend over 250 after meals. Patient takes 10 units of meal coverage at home. Please consider adding Novolog 5 units TID meal coverage if patient consumes at least 50% of meals.   Thanks,  Tama Headings RN, MSN, [facility] Inpatient Diabetes Coordinator Team Pager [phone] (8a-5p)

## 2016-01-28 NOTE — Progress Notes (Signed)
Subjective: 2 Days Post-Op Procedure(s) (LRB): LEFT TOTAL HIP REVISION (Left) Patient reports pain as mild.    Objective: Vital signs in last 24 hours: Temp:  [98.1 F (36.7 C)-99.7 F (37.6 C)] 98.1 F (36.7 C) (05/12 0617) Pulse Rate:  [55-83] 71 (05/12 0617) Resp:  [18-20] 20 (05/12 0617) BP: (104-152)/(52-70) 148/61 mmHg (05/12 0617) SpO2:  [90 %-97 %] 97 % (05/12 0617)  Intake/Output from previous day: 05/11 0701 - 05/12 0700 In: 1208 [P.O.:720; I.V.:488] Out: 250 [Urine:250] Intake/Output this shift:     Recent Labs  01/26/16 1114 01/27/16 0510 01/28/16 0506  HGB 10.5* 9.3* 9.3*    Recent Labs  01/27/16 0510 01/28/16 0506  WBC 8.4 11.8*  RBC 3.07* 3.06*  HCT 28.4* 27.9*  PLT 261 284    Recent Labs  01/27/16 0510  NA 137  K 3.6  CL 103  CO2 27  BUN 22*  CREATININE 1.55*  GLUCOSE 149*  CALCIUM 8.3*   No results for input(s): LABPT, INR in the last 72 hours.  Neurologically intact Neurovascular intact Sensation intact distally Intact pulses distally Dorsiflexion/Plantar flexion intact Incision: scant drainage No cellulitis present Compartment soft  Assessment/Plan: 2 Days Post-Op Procedure(s) (LRB): LEFT TOTAL HIP REVISION (Left) Discharge to snf Saturday  Continue physical therapy monitor glucose and blood pressure restart lisinopril Arther Abbott 01/28/2016, 7:33 AM

## 2016-01-28 NOTE — Progress Notes (Signed)
Physical Therapy Treatment Patient Details Name: [name] MRN: [id number] DOB: [birth year] Today's Date: 01/28/2016    History of Present Illness [name] is a 65yo black female who presents after closed hip reduction under anesthesia. The pt had a L THA on 12/08/15 and DC to [facility] for 3 weeks. At home pt sustained a dislocation on 4/20 reduced in the ED, and another on 4/25 at home, shortly after followup visit with ortho.  Pt has since decided for L THA revision via lateral approach.  Prior to dislocations, pt was ambulating limited community distances with SPC, but had not began OPPT yet. PMH: dysphagia, COPD, ovarian CA, spinal stenosis with neurogenic claudication, DDD of lumbosacral spine, HNP lumbar, knee OA s/p B TKA, sciatica, DM, cardiovascular disease, HTN, abdominal surgery.     PT Comments    Pt received sitting up in the chair, son present, and pt is agreeable to PT tx, however she stated she thought she wasn't going to have another session until tomorrow.  Pt was able to increase gait distance, and continues to improve with transfers.  Pt continues to struggle with increased pain despite PT's encouragement for pt to request pain medications.  Continue to recommend SNF due to no assistance at home.   Follow Up Recommendations  SNF     Equipment Recommendations  None recommended by PT    Recommendations for Other Services       Precautions / Restrictions Precautions Precautions: Fall Precaution Comments: Lateral hip precautions including no abduction, no adduction, no extension, and no flexion >90*.  Abduction wedge at all times unless ambulating.   Restrictions Weight Bearing Restrictions: Yes LLE Weight Bearing: Weight bearing as tolerated    Mobility  Bed Mobility Overal bed mobility: Needs Assistance Bed Mobility: Supine to Sit     Supine to sit: Min assist     General bed mobility comments: Bed flat, and increased time.  Pt requires assistance to  advance L LE to the EOB, and vc's for hand placment to push up.   Transfers Overall transfer level: Needs assistance Equipment used: Rolling walker (2 wheeled) Transfers: Sit to/from Stand Sit to Stand: Min guard         General transfer comment: From bed, and from [facility].   Ambulation/Gait Ambulation/Gait assistance: Min guard Ambulation Distance (Feet): 40 Feet Assistive device: Rolling walker (2 wheeled) Gait Pattern/deviations: Step-through pattern;Antalgic     General Gait Details: decreased L knee flexion for limib advancement.    Stairs            Wheelchair Mobility    Modified Rankin (Stroke Patients Only)       Balance     Sitting balance-Leahy Scale: Good     Standing balance support: Bilateral upper extremity supported Standing balance-Leahy Scale: Fair                      Cognition Arousal/Alertness: Awake/alert Behavior During Therapy: WFL for tasks assessed/performed Overall Cognitive Status: Within Functional Limits for tasks assessed       Memory: Decreased recall of precautions;Decreased short-term memory              Exercises Total Joint Exercises Quad Sets: Strengthening;Both;10 reps;Supine Gluteal Sets: Strengthening;Both;Seated Short Arc Quad: Strengthening;Left;10 reps;Supine Heel Slides: Strengthening;Left;10 reps;Supine (Min A due to being at end of bed, and not being able to clear end of mattress. ) Long Arc Quad: Strengthening;Both;10 reps;Seated    General Comments  Pertinent Vitals/Pain Pain Assessment: 0-10 Pain Score: 8  Pain Location: L hip Pain Descriptors / Indicators: Aching Pain Intervention(s): Limited activity within patient's tolerance;RN gave pain meds during session    Home Living Family/patient expects to be discharged to:: Skilled nursing facility                    Prior Function            PT Goals (current goals can now be found in the care plan section) Acute  Rehab PT Goals Patient Stated Goal: Improve functional mobility. Decrease pain.  PT Goal Formulation: With patient Time For Goal Achievement: 02/02/16 Potential to Achieve Goals: Good Progress towards PT goals: Progressing toward goals    Frequency  BID    PT Plan Current plan remains appropriate    Co-evaluation             End of Session Equipment Utilized During Treatment: Gait belt Activity Tolerance: Patient tolerated treatment well Patient left: in chair;with call bell/phone within reach;with family/visitor present     Time: [id number] PT Time Calculation (min) (ACUTE ONLY): 30 min  Charges:  $Gait Training: 8-22 mins $Therapeutic Activity: 8-22 mins                    G Codes:      Beth Illeana Edick, PT, DPT X: [id number]   01/28/2016, 3:38 PM

## 2016-01-28 NOTE — Care Management Important Message (Signed)
Important Message  Patient Details  Name: [name] MRN: [id number] Date of Birth: 08/04/1951   Medicare Important Message Given:  Yes    Alvie Heidelberg, RN 01/28/2016, 9:50 AM

## 2016-01-28 NOTE — Progress Notes (Signed)
Physical Therapy Treatment Patient Details Name: [name] MRN: [id number] DOB: 1951/01/05 Today's Date: 01/28/2016    History of Present Illness [name] is a 65yo black female who presents after closed hip reduction under anesthesia. The pt had a L THA on 12/08/15 and DC to [facility] for 3 weeks. At home pt sustained a dislocation on 4/20 reduced in the ED, and another on 4/25 at home, shortly after followup visit with ortho.  Pt has since decided for L THA revision via lateral approach.  Prior to dislocations, pt was ambulating limited community distances with SPC, but had not began OPPT yet. PMH: dysphagia, COPD, ovarian CA, spinal stenosis with neurogenic claudication, DDD of lumbosacral spine, HNP lumbar, knee OA s/p B TKA, sciatica, DM, cardiovascular disease, HTN, abdominal surgery.     PT Comments    Pt received in bed, and was agreeable to PT tx.  Pt was able to increase gait distance to 47ft with Min guard, and RW today, however she continues to be limited by increased pain.  Pt expressed strong desire to return to the bed at end of tx, however, pt encouraged to increase upright tolerance in the chair to prevent post-op complications.  Will continue to progress HEP, and gait distance.  Continue to recommend SNF.   Follow Up Recommendations  SNF     Equipment Recommendations  None recommended by PT    Recommendations for Other Services       Precautions / Restrictions Precautions Precautions: Fall;Other (comment) Precaution Comments: Lateral hip precautions including no abduction, no adduction, no extension, and no flexion >90*.  Abduction wedge at all times unless ambulating.   Restrictions Weight Bearing Restrictions: Yes LLE Weight Bearing: Weight bearing as tolerated    Mobility  Bed Mobility Overal bed mobility: Needs Assistance Bed Mobility: Supine to Sit     Supine to sit: Min assist     General bed mobility comments: Bed flat, and increased time.  Pt  requires assistance to advance L LE to the EOB, and vc's for hand placment to push up.   Transfers Overall transfer level: Needs assistance   Transfers: Sit to/from Stand Sit to Stand: Min guard         General transfer comment: From bed, and from [facility]   Ambulation/Gait Ambulation/Gait assistance: Min guard Ambulation Distance (Feet): 30 Feet Assistive device: Rolling walker (2 wheeled) Gait Pattern/deviations: Step-to pattern     General Gait Details: gait continues to be limited by pain.  continued step to pattern with pt needing to speak gait pattern aloud to remember it (RW, L, R).  At end of gait, pt ambulated into the bathroom, and was successful with voiding, and independent with hygiene.     Stairs            Wheelchair Mobility    Modified Rankin (Stroke Patients Only)       Balance     Sitting balance-Leahy Scale: Good     Standing balance support: Bilateral upper extremity supported Standing balance-Leahy Scale: Fair                      Cognition Arousal/Alertness: Awake/alert Behavior During Therapy: WFL for tasks assessed/performed Overall Cognitive Status: Within Functional Limits for tasks assessed       Memory: Decreased recall of precautions;Decreased short-term memory              Exercises Total Joint Exercises Quad Sets: Strengthening;Both;10 reps;Supine Short Arc Quad: Strengthening;Left;10 reps;Supine  Heel Slides: Strengthening;Left;10 reps;Supine (Min A due to being at end of bed, and not being able to clear end of mattress. )    General Comments        Pertinent Vitals/Pain Pain Assessment: 0-10 Pain Score: 5  Pain Location: L hip Pain Descriptors / Indicators: Aching Pain Intervention(s): Limited activity within patient's tolerance;Premedicated before session    Home Living                      Prior Function            PT Goals (current goals can now be found in the care plan section)  Acute Rehab PT Goals Patient Stated Goal: Improve functional mobility. Decrease pain.  PT Goal Formulation: With patient Time For Goal Achievement: 02/02/16 Potential to Achieve Goals: Good    Frequency  BID    PT Plan Current plan remains appropriate    Co-evaluation             End of Session Equipment Utilized During Treatment: Gait belt Activity Tolerance: Patient tolerated treatment well Patient left: in chair;with call bell/phone within reach;with nursing/sitter in room     Time: [id number] PT Time Calculation (min) (ACUTE ONLY): 30 min  Charges:  $Gait Training: 8-22 mins $Therapeutic Activity: 8-22 mins                    G Codes:      Beth Amadou Katzenstein, PT, DPT X: [id number]   01/28/2016, 1:01 PM

## 2016-01-29 ENCOUNTER — Inpatient Hospital Stay
Admission: RE | Admit: 2016-01-29 | Discharge: 2016-02-10 | Disposition: A | Discharge status: Home / Self Care | Payer: Medicare Other | Source: Ambulatory Visit | Attending: Internal Medicine | Admitting: Internal Medicine

## 2016-01-29 DIAGNOSIS — M6281 Muscle weakness (generalized): Secondary | ICD-10-CM | POA: Diagnosis not present

## 2016-01-29 DIAGNOSIS — J42 Unspecified chronic bronchitis: Secondary | ICD-10-CM | POA: Diagnosis not present

## 2016-01-29 DIAGNOSIS — M545 Low back pain: Secondary | ICD-10-CM | POA: Diagnosis not present

## 2016-01-29 DIAGNOSIS — D649 Anemia, unspecified: Secondary | ICD-10-CM | POA: Diagnosis not present

## 2016-01-29 DIAGNOSIS — M1990 Unspecified osteoarthritis, unspecified site: Secondary | ICD-10-CM | POA: Diagnosis not present

## 2016-01-29 DIAGNOSIS — K219 Gastro-esophageal reflux disease without esophagitis: Secondary | ICD-10-CM | POA: Diagnosis not present

## 2016-01-29 DIAGNOSIS — M5137 Other intervertebral disc degeneration, lumbosacral region: Secondary | ICD-10-CM | POA: Diagnosis not present

## 2016-01-29 DIAGNOSIS — I10 Essential (primary) hypertension: Secondary | ICD-10-CM | POA: Diagnosis not present

## 2016-01-29 DIAGNOSIS — T84021D Dislocation of internal left hip prosthesis, subsequent encounter: Secondary | ICD-10-CM | POA: Diagnosis not present

## 2016-01-29 DIAGNOSIS — M5126 Other intervertebral disc displacement, lumbar region: Secondary | ICD-10-CM | POA: Diagnosis not present

## 2016-01-29 DIAGNOSIS — E162 Hypoglycemia, unspecified: Secondary | ICD-10-CM | POA: Diagnosis not present

## 2016-01-29 DIAGNOSIS — Z471 Aftercare following joint replacement surgery: Secondary | ICD-10-CM | POA: Diagnosis not present

## 2016-01-29 DIAGNOSIS — S73002D Unspecified subluxation of left hip, subsequent encounter: Secondary | ICD-10-CM | POA: Diagnosis not present

## 2016-01-29 DIAGNOSIS — E119 Type 2 diabetes mellitus without complications: Secondary | ICD-10-CM | POA: Diagnosis not present

## 2016-01-29 DIAGNOSIS — K5900 Constipation, unspecified: Secondary | ICD-10-CM | POA: Diagnosis not present

## 2016-01-29 DIAGNOSIS — M1612 Unilateral primary osteoarthritis, left hip: Secondary | ICD-10-CM | POA: Diagnosis not present

## 2016-01-29 DIAGNOSIS — R42 Dizziness and giddiness: Secondary | ICD-10-CM | POA: Diagnosis not present

## 2016-01-29 DIAGNOSIS — M179 Osteoarthritis of knee, unspecified: Secondary | ICD-10-CM | POA: Diagnosis not present

## 2016-01-29 DIAGNOSIS — R278 Other lack of coordination: Secondary | ICD-10-CM | POA: Diagnosis not present

## 2016-01-29 DIAGNOSIS — R262 Difficulty in walking, not elsewhere classified: Secondary | ICD-10-CM | POA: Diagnosis not present

## 2016-01-29 DIAGNOSIS — Z794 Long term (current) use of insulin: Secondary | ICD-10-CM | POA: Diagnosis not present

## 2016-01-29 DIAGNOSIS — M5430 Sciatica, unspecified side: Secondary | ICD-10-CM | POA: Diagnosis not present

## 2016-01-29 DIAGNOSIS — Z96642 Presence of left artificial hip joint: Secondary | ICD-10-CM | POA: Diagnosis not present

## 2016-01-29 DIAGNOSIS — M4806 Spinal stenosis, lumbar region: Secondary | ICD-10-CM | POA: Diagnosis not present

## 2016-01-29 DIAGNOSIS — Z87891 Personal history of nicotine dependence: Secondary | ICD-10-CM | POA: Diagnosis not present

## 2016-01-29 DIAGNOSIS — Z720 Tobacco use: Secondary | ICD-10-CM | POA: Diagnosis not present

## 2016-01-29 LAB — GLUCOSE, CAPILLARY
GLUCOSE-CAPILLARY: 122 mg/dL — AB (ref 65–99)
GLUCOSE-CAPILLARY: 162 mg/dL — AB (ref 65–99)
GLUCOSE-CAPILLARY: 68 mg/dL (ref 65–99)

## 2016-01-29 LAB — CBC
HEMATOCRIT: 24.8 % — AB (ref 36.0–46.0)
HEMOGLOBIN: 8.6 g/dL — AB (ref 12.0–15.0)
MCH: 31.5 pg (ref 26.0–34.0)
MCHC: 34.7 g/dL (ref 30.0–36.0)
MCV: 90.8 fL (ref 78.0–100.0)
Platelets: 284 10*3/uL (ref 150–400)
RBC: 2.73 MIL/uL — AB (ref 3.87–5.11)
RDW: 14.1 % (ref 11.5–15.5)
WBC: 9.7 10*3/uL (ref 4.0–10.5)

## 2016-01-29 MED ORDER — FERROUS SULFATE 325 (65 FE) MG PO TABS: 325.0000 mg | ORAL_TABLET | Freq: Three times a day (TID) | ORAL | Status: DC

## 2016-01-29 MED ORDER — OXYCODONE-ACETAMINOPHEN 5-325 MG PO TABS: 1.0000 | ORAL_TABLET | ORAL | Status: DC

## 2016-01-29 NOTE — Discharge Summary (Signed)
Physician Discharge Summary  Patient ID: [name] MRN: [id number] DOB/AGE: [birth year] 65 y.o.  Admit date: 01/26/2016 Discharge date: 01/29/2016  Admission Diagnoses: recurrent dislocation left THA  Discharge Diagnoses: same   Active Problems:   Recurrent dislocation of left hip   Failed total hip arthroplasty with dislocation [facility])   Discharged Condition: good  Hospital Course:  5/10 surgery revison left acetabulum 5/11-5/13 physical therapy   Discharge Exam: Blood pressure 131/58, pulse 74, temperature 97.8 F (36.6 C), temperature source Oral, resp. rate 18, height 5\' 5"  (1.651 m), weight 250 lb (113.399 kg), SpO2 99 %. General appearance: alert, cooperative and no distress Head: Normocephalic, without obvious abnormality, atraumatic Eyes: conjunctivae/corneas clear. PERRL, EOM's intact. Fundi benign. Extremities: extremities normal, atraumatic, no cyanosis or edema Pulses: 2+ and symmetric Skin: Skin color, texture, turgor normal. No rashes or lesions Neurologic: Grossly normal Incision/Wound: Clean  Disposition: 01-Home or Self Care  Discharge Instructions    Call MD / Call 911    Complete by:  As directed   If you experience chest pain or shortness of breath, CALL 911 and be transported to the hospital emergency room.  If you develope a fever above 101 F, pus (white drainage) or increased drainage or redness at the wound, or calf pain, call your surgeon's office.     Change dressing    Complete by:  As directed   You may change your dressing daily as needed     Constipation Prevention    Complete by:  As directed   Drink plenty of fluids.  Prune juice may be helpful.  You may use a stool softener, such as Colace (over the counter) 100 mg twice a day.  Use MiraLax (over the counter) for constipation as needed.     Diet - low sodium heart healthy    Complete by:  As directed      Follow the hip precautions as taught in Physical Therapy    Complete by:  As  directed      Increase activity slowly as tolerated    Complete by:  As directed             Medication List    STOP taking these medications        HYDROcodone-acetaminophen 7.5-325 MG tablet  Commonly known as:  NORCO      TAKE these medications        albuterol 1.25 MG/3ML nebulizer solution  Commonly known as:  ACCUNEB  Take 1 ampule by nebulization every 6 (six) hours as needed for wheezing or shortness of breath.     albuterol 108 (90 Base) MCG/ACT inhaler  Commonly known as:  PROVENTIL HFA;VENTOLIN HFA  Inhale 2 puffs into the lungs every 4 (four) hours as needed for wheezing or shortness of breath.     aspirin EC 325 MG tablet  Take 325 mg by mouth 2 (two) times daily.     atenolol 50 MG tablet  Commonly known as:  TENORMIN  Take 50 mg by mouth daily.     calcium-vitamin D 500-200 MG-UNIT tablet  Commonly known as:  OSCAL WITH D  Take 1 tablet by mouth daily.     docusate sodium 100 MG capsule  Commonly known as:  COLACE  Take 100 mg by mouth 2 (two) times daily.     insulin NPH Human 100 UNIT/ML injection  Commonly known as:  HUMULIN N,NOVOLIN N  Inject 30 Units into the skin daily.     insulin  regular 100 units/mL injection  Commonly known as:  NOVOLIN R,HUMULIN R  Inject 10 Units into the skin 3 (three) times daily before meals.     lisinopril 10 MG tablet  Commonly known as:  PRINIVIL,ZESTRIL  Take 10 mg by mouth every evening.     meloxicam 15 MG tablet  Commonly known as:  MOBIC  Take 1 tablet by mouth daily.     oxyCODONE-acetaminophen 5-325 MG tablet  Commonly known as:  PERCOCET/ROXICET  Take 1 tablet by mouth every 4 (four) hours.     polyethylene glycol packet  Commonly known as:  MIRALAX / GLYCOLAX  Take 17 g by mouth daily as needed for mild constipation.     triamterene-hydrochlorothiazide 37.5-25 MG capsule  Commonly known as:  DYAZIDE  Take 1 capsule by mouth daily.     verapamil 240 MG CR tablet  Commonly known as:   CALAN-SR  Take 1 tablet by mouth daily.     Vitamin D3 1000 units Caps  Take 1 capsule by mouth daily.           Follow-up Information    Follow up with Arther Abbott, MD On 02/09/2016.   Specialties:  Orthopedic Surgery, Radiology   Why:  For suture removal   Contact information:   [address] [place] [address] [phone]       Signed: Arther Abbott 01/29/2016, 9:47 AM

## 2016-01-29 NOTE — Clinical Social Work Placement (Signed)
   CLINICAL SOCIAL WORK PLACEMENT  NOTE  Date:  01/29/2016  Patient Details  Name: [name] MRN: [id number] Date of Birth: 1951/09/18  Clinical Social Work is seeking post-discharge placement for this patient at the [place] level of care (*CSW will initial, date and re-position this form in  chart as items are completed):  Yes   Patient/family provided with [place] Work Department's list of facilities offering this level of care within the geographic area requested by the patient (or if unable, by the patient's family).  Yes   Patient/family informed of their freedom to choose among providers that offer the needed level of care, that participate in Medicare, Medicaid or managed care program needed by the patient, have an available bed and are willing to accept the patient.  Yes   Patient/family informed of [facility]'s ownership interest in [facility] and [facility], as well as of the fact that they are under no obligation to receive care at these facilities.  PASRR submitted to EDS on       PASRR number received on       Existing PASRR number confirmed on 01/27/16     FL2 transmitted to all facilities in geographic area requested by pt/family on 01/27/16     FL2 transmitted to all facilities within larger geographic area on       Patient informed that his/her managed care company has contracts with or will negotiate with certain facilities, including the following:        Yes   Patient/family informed of bed offers received.  Patient chooses bed at [facility]     Physician recommends and patient chooses bed at      Patient to be transferred to [facility] on 01/29/16.  Patient to be transferred to facility by Wheelchair     Patient family notified on 01/29/16 of transfer.  Name of family member notified:  Family in room     PHYSICIAN       Additional Comment:     _______________________________________________ Benard Halsted, [place] 01/29/2016, 12:42 PM

## 2016-01-29 NOTE — Progress Notes (Signed)
Received order to discharge patient. Informed by Education officer, museum Percell Locus)  at [place], [place] that [facility] is requesting that patient be discharged after insulin coverage this evening.

## 2016-01-29 NOTE — Progress Notes (Signed)
Pt to be transferred to [facility]. Report given to [facility]. All questions were answered and no further questions at this time.

## 2016-01-29 NOTE — Progress Notes (Signed)
Pt's IV catheter removed and intact. Pt's IV site clean dry and intact. Pt in stable condition and in no acute distress at time of discharge. Pt escorted by nurse tech.

## 2016-01-31 ENCOUNTER — Ambulatory Visit: Payer: Medicare Other | Admitting: Orthopedic Surgery

## 2016-02-01 ENCOUNTER — Non-Acute Institutional Stay (SKILLED_NURSING_FACILITY): Payer: Medicare Other | Admitting: Internal Medicine

## 2016-02-01 ENCOUNTER — Encounter: Payer: Self-pay | Admitting: Internal Medicine

## 2016-02-01 ENCOUNTER — Encounter (HOSPITAL_COMMUNITY)
Admission: AD | Admit: 2016-02-01 | Discharge: 2016-02-01 | Disposition: A | Discharge status: Home / Self Care | Payer: Medicare Other | Source: Skilled Nursing Facility | Attending: Internal Medicine | Admitting: Internal Medicine

## 2016-02-01 DIAGNOSIS — D649 Anemia, unspecified: Secondary | ICD-10-CM | POA: Diagnosis not present

## 2016-02-01 DIAGNOSIS — R262 Difficulty in walking, not elsewhere classified: Secondary | ICD-10-CM | POA: Diagnosis not present

## 2016-02-01 DIAGNOSIS — IMO0001 Reserved for inherently not codable concepts without codable children: Secondary | ICD-10-CM

## 2016-02-01 DIAGNOSIS — Z471 Aftercare following joint replacement surgery: Secondary | ICD-10-CM | POA: Insufficient documentation

## 2016-02-01 DIAGNOSIS — M1612 Unilateral primary osteoarthritis, left hip: Secondary | ICD-10-CM

## 2016-02-01 DIAGNOSIS — Z794 Long term (current) use of insulin: Secondary | ICD-10-CM | POA: Diagnosis not present

## 2016-02-01 DIAGNOSIS — E119 Type 2 diabetes mellitus without complications: Secondary | ICD-10-CM | POA: Diagnosis not present

## 2016-02-01 DIAGNOSIS — J42 Unspecified chronic bronchitis: Secondary | ICD-10-CM | POA: Insufficient documentation

## 2016-02-01 DIAGNOSIS — Z720 Tobacco use: Secondary | ICD-10-CM | POA: Diagnosis not present

## 2016-02-01 DIAGNOSIS — Z87891 Personal history of nicotine dependence: Secondary | ICD-10-CM | POA: Insufficient documentation

## 2016-02-01 DIAGNOSIS — F17200 Nicotine dependence, unspecified, uncomplicated: Secondary | ICD-10-CM

## 2016-02-01 LAB — COMPREHENSIVE METABOLIC PANEL
ALT: 13 U/L — ABNORMAL LOW (ref 14–54)
AST: 13 U/L — ABNORMAL LOW (ref 15–41)
Albumin: 3.1 g/dL — ABNORMAL LOW (ref 3.5–5.0)
Alkaline Phosphatase: 74 U/L (ref 38–126)
Anion gap: 7 (ref 5–15)
BUN: 17 mg/dL (ref 6–20)
CHLORIDE: 100 mmol/L — AB (ref 101–111)
CO2: 28 mmol/L (ref 22–32)
CREATININE: 0.89 mg/dL (ref 0.44–1.00)
Calcium: 9.2 mg/dL (ref 8.9–10.3)
Glucose, Bld: 111 mg/dL — ABNORMAL HIGH (ref 65–99)
POTASSIUM: 4.1 mmol/L (ref 3.5–5.1)
SODIUM: 135 mmol/L (ref 135–145)
Total Bilirubin: 0.8 mg/dL (ref 0.3–1.2)
Total Protein: 6.2 g/dL — ABNORMAL LOW (ref 6.5–8.1)

## 2016-02-01 NOTE — Assessment & Plan Note (Signed)
Physical therapy Follow-up appointment with Dr. Aline Brochure 02/09/16

## 2016-02-01 NOTE — Progress Notes (Signed)
Facility Location: [facility] Room Number: [id number]  PCP: Maggie Font, MD [address] [place] [address]    This is a comprehensive admission note to [facility] personally performed by Unice Cobble MD on this date less than 30 days from date of admission. Included are preadmission medical/surgical history;reconciled medication list; family history; social history and comprehensive review of systems.  Corrections and additions to the records were documented . Comprehensive physical exam was also performed. Additionally a clinical summary was entered for each active diagnosis pertinent to this admission in the Problem List to enhance continuity of care.   HPI: She was hospitalized 5/10-5/13/17 with recurrent dislocation of the left hip. She was diagnosed as having a failed total hip arthroplasty. On 5/10 she underwent surgical revision of the left acetabulum. She was discharged to [facility] for postop rehabilitation. She has a follow-up appointment with Dr. Aline Brochure her orthopedist 5/24 . She had been at [facility] 3/28-4/11/17 following the original surgery 3/22 by Dr. Aline Brochure.  Preoperatively hemoglobin was 10.5 and hematocrit 32.1 postoperatively hemoglobin 8.6 & hematocrit 24.8.  Glucoses have varied from a low of 111 on 5/16-high of 229 on 5/8.  the last A1c on record was 9.2% on 04/02/13. She states that an A1c was over 8 in January of this year. It has not been rechecked. Postoperatively she had an elevated creatinine of 1.55; creatinine 0.89 today.   Past medical and surgical history: Medical diagnoses include IDDM, hypertension, history of ovarian cancer, COPD, and degenerative joint disease. Also she has lumbar stenosis the context of disc protrusion.  Social history: Updated. Smoking risks discussed  Family history: Updated  Comprehensive review of systems: She states that her fasting blood sugars range 130+-170+;she has intermittent hypoglycemia in the  mornings with glucoses in the range of 70. She describes decreased appetite and decreased intake as a cause of hypoglycemia. She drinks increased amounts of water but denies polydipsia. She was constipated but this responded to medicines. She describes positional carpal tunnel symptoms in her hands at night intermittently. Some intermittent pedal edema. Constitutional: No fever,significant weight change, fatigue  Eyes: No redness, discharge, pain, vision change ENT/mouth: No nasal congestion,  purulent discharge, earache,change in hearing ,sore throat  Cardiovascular: No chest pain, palpitations,paroxysmal nocturnal dyspnea, claudication  Respiratory: No cough, sputum production,hemoptysis, DOE , significant snoring,apnea   Gastrointestinal: No heartburn,dysphagia,abdominal pain, nausea / vomiting,rectal bleeding, melena,change in bowels Genitourinary: No dysuria,hematuria, pyuria,  incontinence, nocturia Dermatologic: No rash, pruritus, change in appearance of skin Neurologic: No dizziness,headache,syncope, seizures Psychiatric: No significant anxiety , depression, insomnia, anorexia Endocrine: No change in hair/skin/ nails, excessive thirst, excessive hunger, excessive urination  Hematologic/lymphatic: No significant bruising, lymphadenopathy,abnormal bleeding Allergy/immunology: No itchy/ watery eyes, significant sneezing, urticaria, angioedema  Physical exam:  Pertinent or positive findings: Waxy deposits are noted on the sclera mainly inferolaterally.  She has complete dentures. Breath sounds are decreased. Dorsalis pedis pulses are decreased. There is faint irregular hyperpigmentation over the left shin, bland in appearance. Crepitus noted at the knees. Deep tendon reflexes are 0+ at the knees. General appearance:Adequately nourished; no acute distress , increased work of breathing is present.   Lymphatic: No lymphadenopathy about the head, neck, axilla . Eyes: No conjunctival  inflammation or lid edema is present. There is no scleral icterus. Ears:  External ear exam shows no significant lesions or deformities.   Nose:  External nasal examination shows no deformity or inflammation. Nasal mucosa are pink and moist without lesions ,  exudates Oral exam: lips and gums are healthy appearing.There is no oropharyngeal erythema or exudate . Neck:  No thyromegaly, masses, tenderness noted.    Heart:  Normal rate and regular rhythm. S1 and S2 normal without gallop, murmur, click, rub .  Lungs:Chest clear to auscultation without wheezes, rhonchi,rales , rubs. Abdomen:Bowel sounds are normal. Abdomen is soft and nontender with no organomegaly, hernias,masses. GU: deferred as previously addressed. Extremities:  No cyanosis, clubbing,significant edema  Neurologic exam : Strength equal  in upper & lower extremities Balance,Rhomberg,finger to nose testing could not be completed due to clinical state Deep tendon reflexes are equal Skin: Warm & dry w/o tenting. No significant lesions or rash.  See clinical summary under each active problem in the Problem List with associated updated therapeutic plan

## 2016-02-01 NOTE — Assessment & Plan Note (Signed)
Role of high fructose corn syrup sugar intake in her diabetic control discussed A1c

## 2016-02-01 NOTE — Assessment & Plan Note (Signed)
Nicotine patch offered if desired

## 2016-02-01 NOTE — Patient Instructions (Addendum)
New orders for [facility] entry 02/04/16: CBC and dif A1c Urine microalbumin Ferrous sulfate 325 mg daily

## 2016-02-01 NOTE — Assessment & Plan Note (Signed)
Ferrous sulfate 325 mg daily Recheck CBC 5/19

## 2016-02-02 ENCOUNTER — Encounter (HOSPITAL_COMMUNITY)
Admission: RE | Admit: 2016-02-02 | Discharge: 2016-02-02 | Disposition: A | Discharge status: Home / Self Care | Payer: Medicare Other | Source: Skilled Nursing Facility | Attending: *Deleted | Admitting: *Deleted

## 2016-02-02 ENCOUNTER — Other Ambulatory Visit: Payer: Self-pay

## 2016-02-02 DIAGNOSIS — R262 Difficulty in walking, not elsewhere classified: Secondary | ICD-10-CM | POA: Diagnosis not present

## 2016-02-02 DIAGNOSIS — Z471 Aftercare following joint replacement surgery: Secondary | ICD-10-CM | POA: Diagnosis not present

## 2016-02-02 DIAGNOSIS — J42 Unspecified chronic bronchitis: Secondary | ICD-10-CM | POA: Diagnosis not present

## 2016-02-02 LAB — CBC WITH DIFFERENTIAL/PLATELET
BASOS ABS: 0 10*3/uL (ref 0.0–0.1)
BASOS PCT: 0 %
EOS PCT: 7 %
Eosinophils Absolute: 0.5 10*3/uL (ref 0.0–0.7)
HCT: 28.2 % — ABNORMAL LOW (ref 36.0–46.0)
Hemoglobin: 9.2 g/dL — ABNORMAL LOW (ref 12.0–15.0)
LYMPHS PCT: 30 %
Lymphs Abs: 2.3 10*3/uL (ref 0.7–4.0)
MCH: 29.7 pg (ref 26.0–34.0)
MCHC: 32.6 g/dL (ref 30.0–36.0)
MCV: 91 fL (ref 78.0–100.0)
Monocytes Absolute: 0.7 10*3/uL (ref 0.1–1.0)
Monocytes Relative: 9 %
Neutro Abs: 4.2 10*3/uL (ref 1.7–7.7)
Neutrophils Relative %: 54 %
PLATELETS: 347 10*3/uL (ref 150–400)
RBC: 3.1 MIL/uL — AB (ref 3.87–5.11)
RDW: 13.9 % (ref 11.5–15.5)
WBC: 7.7 10*3/uL (ref 4.0–10.5)

## 2016-02-02 MED ORDER — OXYCODONE-ACETAMINOPHEN 5-325 MG PO TABS: 1.0000 | ORAL_TABLET | ORAL | Status: DC

## 2016-02-02 NOTE — Telephone Encounter (Signed)
RX faxed to [facility] @ [phone]. Phone number [phone]  

## 2016-02-03 ENCOUNTER — Encounter: Payer: Self-pay | Admitting: Internal Medicine

## 2016-02-03 ENCOUNTER — Non-Acute Institutional Stay (SKILLED_NURSING_FACILITY): Payer: Medicare Other | Admitting: Internal Medicine

## 2016-02-03 DIAGNOSIS — R42 Dizziness and giddiness: Secondary | ICD-10-CM | POA: Diagnosis not present

## 2016-02-03 DIAGNOSIS — E162 Hypoglycemia, unspecified: Secondary | ICD-10-CM | POA: Diagnosis not present

## 2016-02-03 DIAGNOSIS — E119 Type 2 diabetes mellitus without complications: Secondary | ICD-10-CM

## 2016-02-03 DIAGNOSIS — IMO0001 Reserved for inherently not codable concepts without codable children: Secondary | ICD-10-CM

## 2016-02-03 DIAGNOSIS — Z794 Long term (current) use of insulin: Secondary | ICD-10-CM

## 2016-02-03 LAB — HEMOGLOBIN A1C
HEMOGLOBIN A1C: 6.5 % — AB (ref 4.8–5.6)
MEAN PLASMA GLUCOSE: 140 mg/dL

## 2016-02-03 NOTE — Progress Notes (Signed)
   Facility Location: [facility] Room Number: 131-P  PCP: Maggie Font, MD [address] [place] [address]    This is a nursing facility follow up for change in chronic medical diagnosis  Interim medical record and care since last [place] visit was updated with review of diagnostic studies and change in clinical status since last visit were documented.  HPI: She is on a basal insulin NPH 30 units daily as well as 10 units before each meal. The long acting insulin has been held 5/13 as well as 5/15 due to intermittent hypoglycemia. The pre-meal insulin has been held on at least 7 occasions. The random glucoses have ranged from 68-108. Intake has been variable. Her A1c 6.5% suggests a mean plasma glucose of 140.  Additionally the Nurse stated she is on the narcotic medication every 4 hours. The patient is being awakened for the pain medicine based on that order.  Comprehensive review of systems: She is overdue for ophthalmologic exam. In Physical Therapy she has had dizziness which is transient like a "hot flash" when she squats. She continues to have slight edema. Her only neurologic symptoms are intermittent carpal tunnel syndrome in the hands. Constitutional: No fever,significant weight change, fatigue  Eyes: No redness, discharge, pain, vision change Cardiovascular: No chest pain, palpitations,paroxysmal nocturnal dyspnea, claudication  Respiratory: No cough, sputum production,hemoptysis, DOE , significant snoring,apnea   Gastrointestinal: No heartburn,dysphagia,abdominal pain, nausea / vomiting,rectal bleeding, melena,change in bowels Genitourinary: No dysuria,hematuria, pyuria,  incontinence, nocturia Dermatologic: No rash, pruritus, change in appearance of skin Neurologic: No headache,syncope, seizures Endocrine: No change in hair/skin/ nails, excessive thirst, excessive hunger, excessive urination   Physical exam:  Pertinent or positive  findings: Pes planus is present. Dorsalis pedis pulses are decreased. Sensation is normal to light touch over both feet. The left great toenail is thickened. The tip of the right great toenail is artificial. General appearance:Adequately nourished; no acute distress , increased work of breathing is present.   Lymphatic: No lymphadenopathy about the head, neck, axilla . Eyes: No conjunctival inflammation or lid edema is present. There is no scleral icterus. Ears:  External ear exam shows no significant lesions or deformities.   Nose:  External nasal examination shows no deformity or inflammation. Nasal mucosa are pink and moist without lesions ,exudates Oral exam: lips and gums are healthy appearing.There is no oropharyngeal erythema or exudate . Neck:  No thyromegaly, masses, tenderness noted.    Heart:  Normal rate and regular rhythm. S1 and S2 normal without gallop, murmur, click, rub .  Lungs:Chest clear to auscultation without wheezes, rhonchi,rales , rubs. Abdomen:Bowel sounds are normal. Abdomen is soft and nontender with no organomegaly, hernias,masses. GU: deferred as previously addressed. Extremities:  No cyanosis, clubbing  Neurologic exam : Strength equal  in upper & lower extremities Balance,Rhomberg,finger to nose testing could not be completed due to clinical state Deep tendon reflexes are equal Skin: Warm & dry w/o tenting. No significant lesions or rash.    See summary under each active problem in the Problem List with associated updated therapeutic plan

## 2016-02-03 NOTE — Assessment & Plan Note (Addendum)
Decrease NPH to 20 units 5 units of Regular Insulin before meals only if glucose greater than 150 Further reductions based on glucose recordings

## 2016-02-03 NOTE — Patient Instructions (Signed)
See change in insulin orders Physical Therapy to monitor for significant dizziness with position change, specifically with squatting exercises Percocet changed to every 4 hours as needed rather than maintenance

## 2016-02-07 LAB — BASIC METABOLIC PANEL
GLUCOSE: 128 mg/dL
GLUCOSE: 214 mg/dL

## 2016-02-08 LAB — BASIC METABOLIC PANEL
GLUCOSE: 162 mg/dL
GLUCOSE: 79 mg/dL

## 2016-02-09 ENCOUNTER — Ambulatory Visit: Payer: Medicare Other | Admitting: Orthopedic Surgery

## 2016-02-09 VITALS — BP 124/65 | HR 58 | Ht 65.0 in | Wt 250.0 lb

## 2016-02-09 DIAGNOSIS — Z96649 Presence of unspecified artificial hip joint: Secondary | ICD-10-CM

## 2016-02-09 LAB — BASIC METABOLIC PANEL: Glucose: 152 mg/dL

## 2016-02-09 NOTE — Patient Instructions (Addendum)
Discharge to home 02/10/16  Discharge home with home health PT and nurse

## 2016-02-09 NOTE — Progress Notes (Signed)
Patient ID: [name], female   DOB: May 05, 1951, 65 y.o.   MRN: [id number]  Chief Complaint  Patient presents with  . Follow-up    Left HIP Revision DOS 01/26/16    HPI [name] is a 65 y.o. female.  She had a revision hip for recurrent dislocation and vertical cup Status post revision left total hip arthroplasty  This is postoperative VISIT  #1. Staples are removed today;  the incision is clean dry and intact. Steri-Strips are applied and can be removed in 5 days after which time the patient may take a shower.  The patient's pain is controlled with Norco  The patient is on aspirin for DVT prophylaxis  Ambulatory assistance with a walker and a walker  Physical therapy will be continued   Problems /oncerns none  Plan follow-up in 4 weeks

## 2016-02-10 ENCOUNTER — Encounter: Payer: Self-pay | Admitting: Internal Medicine

## 2016-02-10 ENCOUNTER — Non-Acute Institutional Stay (SKILLED_NURSING_FACILITY): Payer: Medicare Other | Admitting: Internal Medicine

## 2016-02-10 DIAGNOSIS — Z87891 Personal history of nicotine dependence: Secondary | ICD-10-CM | POA: Diagnosis not present

## 2016-02-10 DIAGNOSIS — I10 Essential (primary) hypertension: Secondary | ICD-10-CM

## 2016-02-10 DIAGNOSIS — M1612 Unilateral primary osteoarthritis, left hip: Secondary | ICD-10-CM | POA: Diagnosis not present

## 2016-02-10 DIAGNOSIS — E119 Type 2 diabetes mellitus without complications: Secondary | ICD-10-CM

## 2016-02-10 DIAGNOSIS — Z794 Long term (current) use of insulin: Secondary | ICD-10-CM

## 2016-02-10 DIAGNOSIS — D649 Anemia, unspecified: Secondary | ICD-10-CM

## 2016-02-10 DIAGNOSIS — IMO0001 Reserved for inherently not codable concepts without codable children: Secondary | ICD-10-CM

## 2016-02-10 LAB — BASIC METABOLIC PANEL: Glucose: 127 mg/dL

## 2016-02-10 NOTE — Assessment & Plan Note (Signed)
Blood pressure average a 5. 7/66; no change in medications.

## 2016-02-10 NOTE — Progress Notes (Signed)
Patient ID: [name], female   DOB: [birth year], 65 y.o.   MRN: [id number]    PCP: Maggie Font, MD [address] [place] [address]  Facility Location: [facility] Room Number: [id number]   The patient is being discharged from [facility] on this date by Unice Cobble MD. PCP: Dr [name], [place] The medical history in this facility 5/13-5/25/17  was reviewed and summarized and medical problem list was updated. Time spent and note content is documented as follows.  Summary of [facility] medical records: She was admitted for physical therapy which was conducted without complication. She saw Dr Aline Brochure , [place] 02/09/16 and the surgical staples were removed. She was released to home with outpatient physical therapy. While at [facility] her sliding scale insulin was held on 7 occasions and the basal on 2 occasions because of hypoglycemia. The basal insulin was decreased to 20 units and she was placed on 5 units of short-acting insulin before each meal. On this regimen her fasting glucoses were 77-127. Pre meal glucoses ranged from 92-178. 2 hours after evening meal the glucoses range was 146-214. She does plan to continue low-carb diet avoiding excess sweets. She also plans to restrict sugars from High Fructose Corn Syrup containing products.  She does plan to have a follow-up ophthalmologic exam post discharge; this is overdue. Its anticipated she'll be more active as Dr. Mirian Capuchin her activities. The day of discharge her blood pressure was mildly elevated. Her blood pressure has averaged 135.7/66. No change was made in her medications based on this average. She had discontinued smoking prior to surgery. She's remained a nonsmoker at [facility]. She is committed to remaining a nonsmoker. The risk of heart attack and stroke were she to smoke was discussed.    Comprehensive review of systems:Pertinent or active symptoms include: she has numbness and  tingling of the hands related to carpal tunnel syndrome. She has no symptoms related to blood pressure or diabetes.  Negative [id number]: No fever,significant weight change, fatigue  Eyes: No redness, discharge, pain, vision change Cardiovascular: No chest pain, palpitations,paroxysmal nocturnal dyspnea, claudication, edema  Respiratory: No cough, sputum production,hemoptysis, DOE , significant snoring,apnea   Gastrointestinal: No heartburn,dysphagia,abdominal pain, nausea / vomiting,rectal bleeding, melena,change in bowels Genitourinary: No dysuria,hematuria, pyuria,  incontinence, nocturia Musculoskeletal: No joint stiffness, joint swelling, weakness,pain Dermatologic: No rash, pruritus, change in appearance of skin Neurologic: No dizziness,headache,syncope Psychiatric: No significant anxiety , depression, insomnia, anorexia Endocrine: No change in hair/skin/ nails, excessive thirst, excessive hunger, excessive urination  Hematologic/lymphatic: No significant bruising, lymphadenopathy,abnormal bleeding  Physical exam:  Pertinent or positive findings: she has complete dentures. There is trace pedal edema. Pedal pulses are slightly decreased.  General appearance:Adequately nourished; no acute distress , increased work of breathing is present.   Lymphatic: No lymphadenopathy about the head, neck, axilla . Eyes: No conjunctival inflammation or lid edema is present. There is no scleral icterus. Ears:  External ear exam shows no significant lesions or deformities.   Nose:  External nasal examination shows no deformity or inflammation. Nasal mucosa are pink and moist without lesions ,exudates Oral exam: lips and gums are healthy appearing.There is no oropharyngeal erythema or exudate . Neck:  No thyromegaly, masses, tenderness noted.    Heart:  Normal rate and regular rhythm. S1 and S2 normal without gallop, murmur, click, rub .  Lungs:Chest clear to auscultation without wheezes,  rhonchi,rales , rubs. Abdomen:Bowel sounds are normal. Abdomen is soft and nontender  with no organomegaly, hernias,masses. GU: deferred as previously addressed. Extremities:  No cyanosis, clubbing  Neurologic exam : Strength equal  in upper & lower extremities Balance,Rhomberg,finger to nose testing could not be completed due to clinical state Skin: Warm & dry w/o tenting. No significant lesions or rash.  See clinical summary of Discharge Diagnoses in the Problem List with associated updated therapeutic plan  Discharge instructions were written and discharge instructions provided. Follow-up will be by the primary care physician in seven - 14 days.

## 2016-02-10 NOTE — Assessment & Plan Note (Signed)
Repeat CBC and differential at follow-up with Dr. Berdine Addison

## 2016-02-10 NOTE — Assessment & Plan Note (Addendum)
Seen by Dr. Aline Brochure 02/09/16 ; sutures removed and discharge from [facility] recommended He will schedule follow-up visit in approximately 4 weeks  From 5/25

## 2016-02-10 NOTE — Patient Instructions (Signed)
  The following nutritional changes may help prevent Diabetes progression & complications.  White carbohydrates (potatoes, rice, bread, and pasta) cause a high spike of the sugar level which stays elevated for a significant period of time (called sugar"load").  For example a  baked potato has a cup of sugar and a  french fry  2 teaspoons of sugar.  More complex carbs such as yams, wild  rice, whole grained bread &  wheat pasta have been much lower spike and persistent load of sugar than the white carbs.  Read all labels ; avoid foods & drinks with  High Fructose Corn Syrup as #1, 2 , 3 or # 4 on label. Note : dividing the "grams of sugar" on label by 4 gives "teaspoons of sugar" content of food or drink. For example a 22 oz Coke has 68 grams of sugar or 17 tsp of sugar).  Minimal Blood Pressure Goal= AVERAGE < 140/90;  Ideal is an AVERAGE < 135/85. This AVERAGE should be calculated from @ least 5-7 BP readings taken @ different times of day on different days of week. You should not respond to isolated BP readings , but rather the AVERAGE for that week .Please take your  blood pressure cuff to office visits to verify that it is reliable.It  can also be checked against the blood pressure device at the pharmacy. Finger or wrist cuffs are not dependable; an arm cuff is.

## 2016-02-10 NOTE — Assessment & Plan Note (Addendum)
  Low carb / HFCS restricted diet Basal or  long-acting insulin 20 units each morning 5 units of short-acting insulin before the evening meal See Dr. Berdine Addison in 7-14 days with glucose readings If recurrent hypoglycemia is an issue; consider discontinuing the beta blocker as this could mask signs of hypoglycemia

## 2016-02-10 NOTE — Assessment & Plan Note (Signed)
She is committed to remaining a nonsmoker

## 2016-02-11 DIAGNOSIS — M5136 Other intervertebral disc degeneration, lumbar region: Secondary | ICD-10-CM | POA: Diagnosis not present

## 2016-02-11 DIAGNOSIS — Z4732 Aftercare following explantation of hip joint prosthesis: Secondary | ICD-10-CM | POA: Diagnosis not present

## 2016-02-11 DIAGNOSIS — E119 Type 2 diabetes mellitus without complications: Secondary | ICD-10-CM | POA: Diagnosis not present

## 2016-02-11 DIAGNOSIS — Z79891 Long term (current) use of opiate analgesic: Secondary | ICD-10-CM | POA: Diagnosis not present

## 2016-02-11 DIAGNOSIS — J449 Chronic obstructive pulmonary disease, unspecified: Secondary | ICD-10-CM | POA: Diagnosis not present

## 2016-02-11 DIAGNOSIS — Z96642 Presence of left artificial hip joint: Secondary | ICD-10-CM | POA: Diagnosis not present

## 2016-02-11 DIAGNOSIS — M4806 Spinal stenosis, lumbar region: Secondary | ICD-10-CM | POA: Diagnosis not present

## 2016-02-11 DIAGNOSIS — I10 Essential (primary) hypertension: Secondary | ICD-10-CM | POA: Diagnosis not present

## 2016-02-11 DIAGNOSIS — Z794 Long term (current) use of insulin: Secondary | ICD-10-CM | POA: Diagnosis not present

## 2016-02-12 DIAGNOSIS — E119 Type 2 diabetes mellitus without complications: Secondary | ICD-10-CM | POA: Diagnosis not present

## 2016-02-12 DIAGNOSIS — M5136 Other intervertebral disc degeneration, lumbar region: Secondary | ICD-10-CM | POA: Diagnosis not present

## 2016-02-12 DIAGNOSIS — I10 Essential (primary) hypertension: Secondary | ICD-10-CM | POA: Diagnosis not present

## 2016-02-12 DIAGNOSIS — J449 Chronic obstructive pulmonary disease, unspecified: Secondary | ICD-10-CM | POA: Diagnosis not present

## 2016-02-12 DIAGNOSIS — Z794 Long term (current) use of insulin: Secondary | ICD-10-CM | POA: Diagnosis not present

## 2016-02-12 DIAGNOSIS — Z96642 Presence of left artificial hip joint: Secondary | ICD-10-CM | POA: Diagnosis not present

## 2016-02-12 DIAGNOSIS — Z79891 Long term (current) use of opiate analgesic: Secondary | ICD-10-CM | POA: Diagnosis not present

## 2016-02-12 DIAGNOSIS — M4806 Spinal stenosis, lumbar region: Secondary | ICD-10-CM | POA: Diagnosis not present

## 2016-02-12 DIAGNOSIS — Z4732 Aftercare following explantation of hip joint prosthesis: Secondary | ICD-10-CM | POA: Diagnosis not present

## 2016-02-15 DIAGNOSIS — Z96642 Presence of left artificial hip joint: Secondary | ICD-10-CM | POA: Diagnosis not present

## 2016-02-15 DIAGNOSIS — Z96659 Presence of unspecified artificial knee joint: Secondary | ICD-10-CM | POA: Diagnosis not present

## 2016-02-15 DIAGNOSIS — Z4732 Aftercare following explantation of hip joint prosthesis: Secondary | ICD-10-CM | POA: Diagnosis not present

## 2016-02-15 DIAGNOSIS — E119 Type 2 diabetes mellitus without complications: Secondary | ICD-10-CM | POA: Diagnosis not present

## 2016-02-15 DIAGNOSIS — J449 Chronic obstructive pulmonary disease, unspecified: Secondary | ICD-10-CM | POA: Diagnosis not present

## 2016-02-15 DIAGNOSIS — I10 Essential (primary) hypertension: Secondary | ICD-10-CM | POA: Diagnosis not present

## 2016-02-15 DIAGNOSIS — Z79891 Long term (current) use of opiate analgesic: Secondary | ICD-10-CM | POA: Diagnosis not present

## 2016-02-15 DIAGNOSIS — M4806 Spinal stenosis, lumbar region: Secondary | ICD-10-CM | POA: Diagnosis not present

## 2016-02-15 DIAGNOSIS — M5136 Other intervertebral disc degeneration, lumbar region: Secondary | ICD-10-CM | POA: Diagnosis not present

## 2016-02-15 DIAGNOSIS — Z8543 Personal history of malignant neoplasm of ovary: Secondary | ICD-10-CM | POA: Diagnosis not present

## 2016-02-15 DIAGNOSIS — Z794 Long term (current) use of insulin: Secondary | ICD-10-CM | POA: Diagnosis not present

## 2016-02-16 DIAGNOSIS — Z8543 Personal history of malignant neoplasm of ovary: Secondary | ICD-10-CM | POA: Diagnosis not present

## 2016-02-16 DIAGNOSIS — E119 Type 2 diabetes mellitus without complications: Secondary | ICD-10-CM | POA: Diagnosis not present

## 2016-02-16 DIAGNOSIS — Z79891 Long term (current) use of opiate analgesic: Secondary | ICD-10-CM | POA: Diagnosis not present

## 2016-02-16 DIAGNOSIS — Z794 Long term (current) use of insulin: Secondary | ICD-10-CM | POA: Diagnosis not present

## 2016-02-16 DIAGNOSIS — I10 Essential (primary) hypertension: Secondary | ICD-10-CM | POA: Diagnosis not present

## 2016-02-16 DIAGNOSIS — J449 Chronic obstructive pulmonary disease, unspecified: Secondary | ICD-10-CM | POA: Diagnosis not present

## 2016-02-16 DIAGNOSIS — M5136 Other intervertebral disc degeneration, lumbar region: Secondary | ICD-10-CM | POA: Diagnosis not present

## 2016-02-16 DIAGNOSIS — Z4732 Aftercare following explantation of hip joint prosthesis: Secondary | ICD-10-CM | POA: Diagnosis not present

## 2016-02-16 DIAGNOSIS — Z96642 Presence of left artificial hip joint: Secondary | ICD-10-CM | POA: Diagnosis not present

## 2016-02-16 DIAGNOSIS — E118 Type 2 diabetes mellitus with unspecified complications: Secondary | ICD-10-CM | POA: Diagnosis not present

## 2016-02-16 DIAGNOSIS — M4806 Spinal stenosis, lumbar region: Secondary | ICD-10-CM | POA: Diagnosis not present

## 2016-02-17 DIAGNOSIS — M5136 Other intervertebral disc degeneration, lumbar region: Secondary | ICD-10-CM | POA: Diagnosis not present

## 2016-02-17 DIAGNOSIS — Z794 Long term (current) use of insulin: Secondary | ICD-10-CM | POA: Diagnosis not present

## 2016-02-17 DIAGNOSIS — Z4732 Aftercare following explantation of hip joint prosthesis: Secondary | ICD-10-CM | POA: Diagnosis not present

## 2016-02-17 DIAGNOSIS — I10 Essential (primary) hypertension: Secondary | ICD-10-CM | POA: Diagnosis not present

## 2016-02-17 DIAGNOSIS — Z96642 Presence of left artificial hip joint: Secondary | ICD-10-CM | POA: Diagnosis not present

## 2016-02-17 DIAGNOSIS — E119 Type 2 diabetes mellitus without complications: Secondary | ICD-10-CM | POA: Diagnosis not present

## 2016-02-17 DIAGNOSIS — M4806 Spinal stenosis, lumbar region: Secondary | ICD-10-CM | POA: Diagnosis not present

## 2016-02-17 DIAGNOSIS — J449 Chronic obstructive pulmonary disease, unspecified: Secondary | ICD-10-CM | POA: Diagnosis not present

## 2016-02-17 DIAGNOSIS — Z8543 Personal history of malignant neoplasm of ovary: Secondary | ICD-10-CM | POA: Diagnosis not present

## 2016-02-17 DIAGNOSIS — Z79891 Long term (current) use of opiate analgesic: Secondary | ICD-10-CM | POA: Diagnosis not present

## 2016-02-18 DIAGNOSIS — I10 Essential (primary) hypertension: Secondary | ICD-10-CM | POA: Diagnosis not present

## 2016-02-18 DIAGNOSIS — M5136 Other intervertebral disc degeneration, lumbar region: Secondary | ICD-10-CM | POA: Diagnosis not present

## 2016-02-18 DIAGNOSIS — Z96642 Presence of left artificial hip joint: Secondary | ICD-10-CM | POA: Diagnosis not present

## 2016-02-18 DIAGNOSIS — Z79891 Long term (current) use of opiate analgesic: Secondary | ICD-10-CM | POA: Diagnosis not present

## 2016-02-18 DIAGNOSIS — J449 Chronic obstructive pulmonary disease, unspecified: Secondary | ICD-10-CM | POA: Diagnosis not present

## 2016-02-18 DIAGNOSIS — M4806 Spinal stenosis, lumbar region: Secondary | ICD-10-CM | POA: Diagnosis not present

## 2016-02-18 DIAGNOSIS — Z4732 Aftercare following explantation of hip joint prosthesis: Secondary | ICD-10-CM | POA: Diagnosis not present

## 2016-02-18 DIAGNOSIS — Z8543 Personal history of malignant neoplasm of ovary: Secondary | ICD-10-CM | POA: Diagnosis not present

## 2016-02-18 DIAGNOSIS — E119 Type 2 diabetes mellitus without complications: Secondary | ICD-10-CM | POA: Diagnosis not present

## 2016-02-18 DIAGNOSIS — Z794 Long term (current) use of insulin: Secondary | ICD-10-CM | POA: Diagnosis not present

## 2016-02-21 DIAGNOSIS — J449 Chronic obstructive pulmonary disease, unspecified: Secondary | ICD-10-CM | POA: Diagnosis not present

## 2016-02-21 DIAGNOSIS — Z4732 Aftercare following explantation of hip joint prosthesis: Secondary | ICD-10-CM | POA: Diagnosis not present

## 2016-02-21 DIAGNOSIS — Z79891 Long term (current) use of opiate analgesic: Secondary | ICD-10-CM | POA: Diagnosis not present

## 2016-02-21 DIAGNOSIS — E119 Type 2 diabetes mellitus without complications: Secondary | ICD-10-CM | POA: Diagnosis not present

## 2016-02-21 DIAGNOSIS — M5136 Other intervertebral disc degeneration, lumbar region: Secondary | ICD-10-CM | POA: Diagnosis not present

## 2016-02-21 DIAGNOSIS — Z8543 Personal history of malignant neoplasm of ovary: Secondary | ICD-10-CM | POA: Diagnosis not present

## 2016-02-21 DIAGNOSIS — Z794 Long term (current) use of insulin: Secondary | ICD-10-CM | POA: Diagnosis not present

## 2016-02-21 DIAGNOSIS — I10 Essential (primary) hypertension: Secondary | ICD-10-CM | POA: Diagnosis not present

## 2016-02-21 DIAGNOSIS — Z96642 Presence of left artificial hip joint: Secondary | ICD-10-CM | POA: Diagnosis not present

## 2016-02-21 DIAGNOSIS — M4806 Spinal stenosis, lumbar region: Secondary | ICD-10-CM | POA: Diagnosis not present

## 2016-02-22 DIAGNOSIS — Z8543 Personal history of malignant neoplasm of ovary: Secondary | ICD-10-CM | POA: Diagnosis not present

## 2016-02-22 DIAGNOSIS — J449 Chronic obstructive pulmonary disease, unspecified: Secondary | ICD-10-CM | POA: Diagnosis not present

## 2016-02-22 DIAGNOSIS — Z96642 Presence of left artificial hip joint: Secondary | ICD-10-CM | POA: Diagnosis not present

## 2016-02-22 DIAGNOSIS — Z79891 Long term (current) use of opiate analgesic: Secondary | ICD-10-CM | POA: Diagnosis not present

## 2016-02-22 DIAGNOSIS — E119 Type 2 diabetes mellitus without complications: Secondary | ICD-10-CM | POA: Diagnosis not present

## 2016-02-22 DIAGNOSIS — M5136 Other intervertebral disc degeneration, lumbar region: Secondary | ICD-10-CM | POA: Diagnosis not present

## 2016-02-22 DIAGNOSIS — Z4732 Aftercare following explantation of hip joint prosthesis: Secondary | ICD-10-CM | POA: Diagnosis not present

## 2016-02-22 DIAGNOSIS — Z794 Long term (current) use of insulin: Secondary | ICD-10-CM | POA: Diagnosis not present

## 2016-02-22 DIAGNOSIS — I10 Essential (primary) hypertension: Secondary | ICD-10-CM | POA: Diagnosis not present

## 2016-02-22 DIAGNOSIS — M4806 Spinal stenosis, lumbar region: Secondary | ICD-10-CM | POA: Diagnosis not present

## 2016-02-23 ENCOUNTER — Encounter: Payer: Self-pay | Admitting: Orthopedic Surgery

## 2016-02-23 ENCOUNTER — Ambulatory Visit: Payer: Medicare Other | Admitting: Orthopedic Surgery

## 2016-02-23 VITALS — BP 153/80 | Ht 65.0 in | Wt 236.0 lb

## 2016-02-23 DIAGNOSIS — Z96649 Presence of unspecified artificial hip joint: Secondary | ICD-10-CM

## 2016-02-23 NOTE — Progress Notes (Signed)
Chief Complaint  Patient presents with  . Follow-up    POST OP LEFT THA REVISION, DOS 01/26/16    BP 153/80 mmHg  Ht 5\' 5"  (1.651 m)  Wt 236 lb (107.049 kg)  BMI 39.27 kg/m2  Doing well   Ambulating with cane   Mild discomfort  Legs are equal length  Start outpx PT   Return in 4 weeks

## 2016-02-23 NOTE — Patient Instructions (Signed)
PT start outpatient

## 2016-02-24 DIAGNOSIS — Z79891 Long term (current) use of opiate analgesic: Secondary | ICD-10-CM | POA: Diagnosis not present

## 2016-02-24 DIAGNOSIS — M5136 Other intervertebral disc degeneration, lumbar region: Secondary | ICD-10-CM | POA: Diagnosis not present

## 2016-02-24 DIAGNOSIS — J449 Chronic obstructive pulmonary disease, unspecified: Secondary | ICD-10-CM | POA: Diagnosis not present

## 2016-02-24 DIAGNOSIS — E119 Type 2 diabetes mellitus without complications: Secondary | ICD-10-CM | POA: Diagnosis not present

## 2016-02-24 DIAGNOSIS — I10 Essential (primary) hypertension: Secondary | ICD-10-CM | POA: Diagnosis not present

## 2016-02-24 DIAGNOSIS — Z4732 Aftercare following explantation of hip joint prosthesis: Secondary | ICD-10-CM | POA: Diagnosis not present

## 2016-02-24 DIAGNOSIS — Z8543 Personal history of malignant neoplasm of ovary: Secondary | ICD-10-CM | POA: Diagnosis not present

## 2016-02-24 DIAGNOSIS — Z794 Long term (current) use of insulin: Secondary | ICD-10-CM | POA: Diagnosis not present

## 2016-02-24 DIAGNOSIS — Z96642 Presence of left artificial hip joint: Secondary | ICD-10-CM | POA: Diagnosis not present

## 2016-02-24 DIAGNOSIS — M4806 Spinal stenosis, lumbar region: Secondary | ICD-10-CM | POA: Diagnosis not present

## 2016-02-28 ENCOUNTER — Ambulatory Visit (HOSPITAL_COMMUNITY): Payer: Medicare Other | Attending: Orthopedic Surgery | Admitting: Physical Therapy

## 2016-02-28 DIAGNOSIS — R262 Difficulty in walking, not elsewhere classified: Secondary | ICD-10-CM | POA: Diagnosis not present

## 2016-02-28 DIAGNOSIS — M25552 Pain in left hip: Secondary | ICD-10-CM

## 2016-02-28 DIAGNOSIS — R2681 Unsteadiness on feet: Secondary | ICD-10-CM | POA: Diagnosis not present

## 2016-02-28 DIAGNOSIS — M6281 Muscle weakness (generalized): Secondary | ICD-10-CM

## 2016-02-28 NOTE — Therapy (Signed)
[facility] [address] [place], [place], [address] Phone: [phone]   Fax:  [phone]  Physical Therapy Evaluation  Patient Details  Name: [name] MRN: [id number] Date of Birth: 07-19-1951 Referring Provider: Arther Abbott   Encounter Date: 02/28/2016      PT End of Session - 02/28/16 1539    Visit Number 1   Number of Visits 18   Date for PT Re-Evaluation 03/20/16   Authorization Type Mediicare    Authorization Time Period 02/28/16 to 04/10/16   Authorization - Visit Number 1   Authorization - Number of Visits 10   PT Start Time [id number]   PT Stop Time 1513   PT Time Calculation (min) 41 min   Activity Tolerance Patient tolerated treatment well   Behavior During Therapy [facility] for tasks assessed/performed      Past Medical History  Diagnosis Date  . Diabetes mellitus     Insulin-dependent diabetes  . Hypertension   . Cancer ([place])     ovarian  . COPD (chronic obstructive pulmonary disease) ([place])   . Degenerative joint disease   . Carpal tunnel syndrome     Nocturnal and positional, bilateral    Past Surgical History  Procedure Laterality Date  . Replacement total knee      bilateral  . Cholecystectomy    . Colonoscopy  2007    Dr. Gala Romney: internal hemorrhoids, few scattered sigmoid diverticula  . Esophagogastroduodenoscopy  2008    Dr. Gala Romney: erosive esophagitis, patulous EG junction  . Colonoscopy N/A 04/09/2014    [id number] diverticulosis. Redundant colon. Single colonic polyp-removed as described above.  . Esophagogastroduodenoscopy N/A 04/09/2014    [id number] reflux esophagitis. Schatzki's ring-status post dilation as described above. Hiatal hernia. Gastricerosions-status post gastric biopsy  . Savory dilation N/A 04/09/2014    Procedure: SAVORY DILATION;  Surgeon: Daneil Dolin, MD;  [facility];  Service: Endoscopy;  Laterality: N/A;  Venia Minks dilation N/A 04/09/2014    Procedure:  Venia Minks DILATION;  Surgeon: Daneil Dolin, MD;  [facility];  Service: Endoscopy;  Laterality: N/A;  . Abdominal hysterectomy      BSO  . Abdominal surgery      for bleeding after hysterectomy  . Left hip replacement  12/08/2015  . Total hip arthroplasty Left 12/08/2015    Procedure: TOTAL HIP ARTHROPLASTY;  Surgeon: Carole Civil, MD;  Location: [facility];  Service: Orthopedics;  Laterality: Left;  . Hip closed reduction Left 01/15/2016    Procedure: CLOSED REDUCTION LEFT HIP PROSTHESIS;  Surgeon: Carole Civil, MD;  Location: [facility];  Service: Orthopedics;  Laterality: Left;  . Total hip revision Left 01/26/2016    Procedure: LEFT TOTAL HIP REVISION;  Surgeon: Carole Civil, MD;  Location: [facility];  Service: Orthopedics;  Laterality: Left;  . Toenail excision      R great toenail; artificial nail    There were no vitals filed for this visit.       Subjective Assessment - 02/28/16 1438    Subjective Patient reports that first surgery was March 22nd; after surgery her hip popped out of place twice (all she did was sit down in a chair, which has now been taken out of her home). Her hip revision was May 10th per lateral approach per EPIC notes. She went to the [facility] center and received HHPT, from which she has been discharged from . She has been careful about not bending over  and is still sleeping with the wedge between her legs. She has not had to deal with stairs, and walking and everything else is going OK at home. No close calls or falls recently.    Pertinent History beta-blockers, DM, COPD, history of CA, B TKR, L hip replacement    How long can you sit comfortably? 6/12- unlimited but she has to shift weight    How long can you stand comfortably? 6/12- 5-10 minutes    How long can you walk comfortably? 6/12- reoprts she has been able to go around 541ft with cane    Patient Stated Goals be able to walk without cane   Currently in Pain? Yes   Pain Score 1    Pain  Location Hip   Pain Orientation Left   Pain Descriptors / Indicators Sore;Nagging   Pain Type Surgical pain   Pain Radiating Towards none    Pain Onset 1 to 4 weeks ago   Pain Frequency Constant   Aggravating Factors  no real aggravating factors    Pain Relieving Factors none    Effect of Pain on Daily Activities cannot get back to PLOF             [facility] PT Assessment - 02/28/16 0001    Assessment   Medical Diagnosis hip joint replacement status    Referring Provider Arther Abbott    Onset Date/Surgical Date --  March 22nd, then May 10th for revision    Next MD Visit [facility] July 1st    Precautions   Precaution Comments lateral hip precautions    Balance Screen   Has the patient fallen in the past 6 months No   Has the patient had a decrease in activity level because of a fear of falling?  No   Is the patient reluctant to leave their home because of a fear of falling?  No   Prior Function   Level of Independence Independent;Independent with basic ADLs;Independent with gait;Independent with transfers   Vocation Retired   Leisure no hobbies    Observation/Other Assessments   Observations scar appears well healing although muscle knotting and scar tissue is palpable underneath the skin    Strength   Right Hip Flexion 3+/5   Right Hip Extension --  dnt    Right Hip ABduction 3+/5  isometric in sitting    Left Hip Flexion 3+/5  estimated with isometric test secondary to hip precautions    Left Hip Extension --  dnt    Left Hip ABduction 3+/5  isometric in sitting    Right Knee Flexion 5/5   Right Knee Extension 5/5   Left Knee Flexion 3+/5   Left Knee Extension 4/5   Right Ankle Dorsiflexion 5/5   Left Ankle Dorsiflexion 5/5   Ambulation/Gait   Gait Comments proximal muscle weakness, B pronation, flexed at hips, reduced gait speed, some hip hiking L    6 minute walk test results    Aerobic Endurance Distance Walked 339   Endurance additional comments 3MWT, SPC,  no rest    Timed Up and Go Test   Normal TUG (seconds) 15.35   TUG Comments SPC                    [facility] Adult PT Treatment/Exercise - 02/28/16 0001    Knee/Hip Exercises: Standing   Heel Raises Both;1 set;15 reps   Heel Raises Limitations heel and toe    Rocker Board 2 minutes   Rocker  Board Limitations [facility] and late[facility]                 PT Education - 02/28/16 1537    Education provided Yes   Education Details prognosis, POC; stay with HHPT HEP for now; reviewed lateral hip precautions    Person(s) Educated Patient   Methods Explanation   Comprehension Verbalized understanding          PT Short Term Goals - 02/28/16 1540    PT SHORT TERM GOAL #1   Title Patient will demonstrate improved gait mechanics, including proper heel-toe pattern, improved posture, and improved gait speed with no assistive device in order to demonstrate improved mobility    Time 3   Period Weeks   Status New   PT SHORT TERM GOAL #2   Title Patient will be able to perform TUG in 10 seconds or less with LRAD in order to demonstrate improved mobility and balance    Time 3   Period Weeks   Status New   PT SHORT TERM GOAL #3   Title Patient to be independent in techniques such as scar massage and correct application of ice in order to enhance self efficacy in managing condition    Time 3   Period Weeks   Status New   PT SHORT TERM GOAL #4   Title Patient to be indepndent in correctly and consistently performing advanced HEP, to be updated PRN    Time 3   Period Weeks   Status New           PT Long Term Goals - 02/28/16 1604    PT LONG TERM GOAL #1   Title Patient to demonstrate strength 4+/5 in all tested muscle groups in order to assist in improving mobility    Time 6   Period Weeks   Status New   PT LONG TERM GOAL #2   Title Patient to be able to ambulate at least 1212ft during 6MWT with no device in order to demonstrate improved mobilty and to faclitate navigation  of community    Time 6   Period Weeks   Status New   PT LONG TERM GOAL #3   Title Patient to be particpatory in regular exercise program, at least 30 minutes in duration, at least 3 days per week, in order to assist in improving overall health status and to maintain functional gains    Time 6   Period Weeks   Status New               Plan - 02/28/16 1504    Clinical Impression Statement Patient presents status post lateral hip revision; she reports taht she originally had surgery in march, however her hip dislocated twice when she was at home, and she went in for revision surgery in May. Upon examination, patient reveals unsteadiness, signifcant functional muscle weakness, and functional gait impairments/reduced gait tolerance at this time. Patient reports she is still actively within all hip precautions at this time; DPT will contact MD to find out how long patient will be within precautions. At this point recommend skilled PT services in order to addres s functional limitaiotns and assist in reaching optimal level of function.    Rehab Potential Good   PT Frequency 3x / week   PT Duration 6 weeks   PT Treatment/Interventions ADLs/Self Care Home Management;Cryotherapy;Moist Heat;DME Instruction;Gait training;Stair training;Functional mobility training;Therapeutic activities;Therapeutic exercise;Balance training;Neuromuscular re-education;Patient/family education;Manual techniques;Scar mobilization;Energy conservation;Taping   PT Next Visit Plan review  initial eval/goals; functional strengthening within precautions; balance training; gait training.    PT Home Exercise Plan staying with HHPT HEP for now    Consulted and Agree with Plan of Care Patient      Patient will benefit from skilled therapeutic intervention in order to improve the following deficits and impairments:  Abnormal gait, Decreased scar mobility, Decreased activity tolerance, Decreased strength, Pain, Decreased balance,  Decreased mobility, Difficulty walking, Improper body mechanics, Decreased coordination, Decreased safety awareness, Postural dysfunction  Visit Diagnosis: Pain in left hip - Plan: PT plan of care cert/re-cert  Muscle weakness (generalized) - Plan: PT plan of care cert/re-cert  Difficulty in walking, not elsewhere classified - Plan: PT plan of care cert/re-cert  Unsteadiness on feet - Plan: PT plan of care cert/re-cert      G-Codes - [id number]    Functional Assessment Tool Used Based on skilled clinical assessment of strenght, gait, balance    Functional Limitation Mobility: Walking and moving around   Mobility: Walking and Moving Around Current Status [id number]) At least 40 percent but less than 60 percent impaired, limited or restricted   Mobility: Walking and Moving Around Goal Status [id number]) At least 20 percent but less than 40 percent impaired, limited or restricted       Problem List Patient Active Problem List   Diagnosis Date Noted  . Essential hypertension 02/10/2016  . Former smoker 02/01/2016  . Anemia, unspecified 02/01/2016  . Osteoarthritis of left hip   . GERD (gastroesophageal reflux disease) 04/02/2013  . Chronic bronchitis with acute exacerbation ([place]) 04/02/2013  . History of ovarian cancer 04/02/2013  . Spinal stenosis, lumbar region, with neurogenic claudication 02/27/2013  . Iliotibial band tendonitis 02/27/2013  . DEGENERATIVE DISC DISEASE, LUMBOSACRAL SPINE 12/09/2009  . H N P-LUMBAR 09/29/2009  . KNEE, ARTHRITIS, DEGEN./OSTEO 09/23/2008  . SCIATICA 05/14/2008  . Insulin dependent diabetes mellitus ([place]) 05/12/2008    [name] PT, DPT [phone]  [place] [address] [place], [place], [address] Phone: [phone]   Fax:  [phone]  Name: [name] MRN: [id number] Date of Birth: [birth year]

## 2016-03-01 ENCOUNTER — Ambulatory Visit (HOSPITAL_COMMUNITY): Payer: Medicare Other | Admitting: Physical Therapy

## 2016-03-01 DIAGNOSIS — R262 Difficulty in walking, not elsewhere classified: Secondary | ICD-10-CM

## 2016-03-01 DIAGNOSIS — M6281 Muscle weakness (generalized): Secondary | ICD-10-CM | POA: Diagnosis not present

## 2016-03-01 DIAGNOSIS — R2681 Unsteadiness on feet: Secondary | ICD-10-CM

## 2016-03-01 DIAGNOSIS — M25552 Pain in left hip: Secondary | ICD-10-CM

## 2016-03-01 NOTE — Therapy (Signed)
[facility] [address] [place], [place], [address] Phone: [phone]   Fax:  [phone]  Physical Therapy Treatment  Patient Details  Name: [name] MRN: [id number] Date of Birth: 11/23/1950 Referring Provider: Arther Abbott   Encounter Date: 03/01/2016      PT End of Session - 03/01/16 0904    Visit Number 2   Number of Visits 18   Date for PT Re-Evaluation 03/20/16   Authorization Type Mediicare    Authorization Time Period 02/28/16 to 04/10/16   Authorization - Visit Number 2   Authorization - Number of Visits 10   PT Start Time 0830   PT Stop Time 0900   PT Time Calculation (min) 30 min   Activity Tolerance Patient tolerated treatment well   Behavior During Therapy [facility] for tasks assessed/performed      Past Medical History  Diagnosis Date  . Diabetes mellitus     Insulin-dependent diabetes  . Hypertension   . Cancer ([place])     ovarian  . COPD (chronic obstructive pulmonary disease) ([place])   . Degenerative joint disease   . Carpal tunnel syndrome     Nocturnal and positional, bilateral    Past Surgical History  Procedure Laterality Date  . Replacement total knee      bilateral  . Cholecystectomy    . Colonoscopy  2007    Dr. Gala Romney: internal hemorrhoids, few scattered sigmoid diverticula  . Esophagogastroduodenoscopy  2008    Dr. Gala Romney: erosive esophagitis, patulous EG junction  . Colonoscopy N/A 04/09/2014    [id number] diverticulosis. Redundant colon. Single colonic polyp-removed as described above.  . Esophagogastroduodenoscopy N/A 04/09/2014    [id number] reflux esophagitis. Schatzki's ring-status post dilation as described above. Hiatal hernia. Gastricerosions-status post gastric biopsy  . Savory dilation N/A 04/09/2014    Procedure: SAVORY DILATION;  Surgeon: Daneil Dolin, MD;  [facility];  Service: Endoscopy;  Laterality: N/A;  Venia Minks dilation N/A 04/09/2014    Procedure:  Venia Minks DILATION;  Surgeon: Daneil Dolin, MD;  [facility];  Service: Endoscopy;  Laterality: N/A;  . Abdominal hysterectomy      BSO  . Abdominal surgery      for bleeding after hysterectomy  . Left hip replacement  12/08/2015  . Total hip arthroplasty Left 12/08/2015    Procedure: TOTAL HIP ARTHROPLASTY;  Surgeon: Carole Civil, MD;  Location: [facility];  Service: Orthopedics;  Laterality: Left;  . Hip closed reduction Left 01/15/2016    Procedure: CLOSED REDUCTION LEFT HIP PROSTHESIS;  Surgeon: Carole Civil, MD;  Location: [facility];  Service: Orthopedics;  Laterality: Left;  . Total hip revision Left 01/26/2016    Procedure: LEFT TOTAL HIP REVISION;  Surgeon: Carole Civil, MD;  Location: [facility];  Service: Orthopedics;  Laterality: Left;  . Toenail excision      R great toenail; artificial nail    There were no vitals filed for this visit.      Subjective Assessment - 03/01/16 0832    Subjective Pt states she is doing good today. No pain but some soreness noted along her Lt hip. No other complaints at this time.   Pertinent History beta-blockers, DM, COPD, history of CA, B TKR, L hip replacement    How long can you sit comfortably? 6/12- unlimited but she has to shift weight    How long can you stand comfortably? 6/12- 5-10 minutes    How long can  you walk comfortably? 6/12- reoprts she has been able to go around 525ft with cane    Patient Stated Goals be able to walk without cane   Currently in Pain? No/denies   Pain Onset 1 to 4 weeks ago                         [facility] Adult PT Treatment/Exercise - 03/01/16 0001    Ambulation/Gait   Gait Comments antalgic gait noted, worse without AD   Knee/Hip Exercises: Standing   Heel Raises 20 reps;Both   Heel Raises Limitations heel and toe   Forward Step Up Left;1 set;20 reps;Hand Hold: 1;Step Height: 4"   Other Standing Knee Exercises RLE march with 1UE support x20   Knee/Hip Exercises: Seated    Sit to Sand 10 reps  cues for equal weight shift    Knee/Hip Exercises: Supine   Bridges Limitations 2x10 to neutral                PT Education - 03/01/16 0901    Education provided Yes   Education Details HEP; encouraged equal weight with sit to stand; use of SPC to prevent antalgic gait pattern    Person(s) Educated Patient   Methods Explanation;Demonstration;Handout   Comprehension Verbalized understanding;Returned demonstration          PT Short Term Goals - 02/28/16 1540    PT SHORT TERM GOAL #1   Title Patient will demonstrate improved gait mechanics, including proper heel-toe pattern, improved posture, and improved gait speed with no assistive device in order to demonstrate improved mobility    Time 3   Period Weeks   Status New   PT SHORT TERM GOAL #2   Title Patient will be able to perform TUG in 10 seconds or less with LRAD in order to demonstrate improved mobility and balance    Time 3   Period Weeks   Status New   PT SHORT TERM GOAL #3   Title Patient to be independent in techniques such as scar massage and correct application of ice in order to enhance self efficacy in managing condition    Time 3   Period Weeks   Status New   PT SHORT TERM GOAL #4   Title Patient to be indepndent in correctly and consistently performing advanced HEP, to be updated PRN    Time 3   Period Weeks   Status New           PT Long Term Goals - 02/28/16 1604    PT LONG TERM GOAL #1   Title Patient to demonstrate strength 4+/5 in all tested muscle groups in order to assist in improving mobility    Time 6   Period Weeks   Status New   PT LONG TERM GOAL #2   Title Patient to be able to ambulate at least 1254ft during 6MWT with no device in order to demonstrate improved mobilty and to faclitate navigation of community    Time 6   Period Weeks   Status New   PT LONG TERM GOAL #3   Title Patient to be particpatory in regular exercise program, at least 30 minutes in  duration, at least 3 days per week, in order to assist in improving overall health status and to maintain functional gains    Time 6   Period Weeks   Status New               Plan -  03/01/16 0848    Clinical Impression Statement Today's session focused on functional strengthening within precautions. Pt demonstrating weight shift to the Rt during sit to stand so PT reminded pt of the importance of even weight distribution and provided verbal/tactile cues to decrease overuse of the RLE and improve strength of the Lt. Pt with good tolerance to all activity with only minimal report of fatigue. Will continue with current POC.   Rehab Potential Good   PT Frequency 3x / week   PT Duration 6 weeks   PT Treatment/Interventions ADLs/Self Care Home Management;Cryotherapy;Moist Heat;DME Instruction;Gait training;Stair training;Functional mobility training;Therapeutic activities;Therapeutic exercise;Balance training;Neuromuscular re-education;Patient/family education;Manual techniques;Scar mobilization;Energy conservation;Taping   PT Next Visit Plan gait training; functional strengthening within precautions; balance training as needed   PT Home Exercise Plan heel/toe raises, bridge to neutral, sit to stand from elevated surface with equal weight bearing, standing R hip flexion   Consulted and Agree with Plan of Care Patient      Patient will benefit from skilled therapeutic intervention in order to improve the following deficits and impairments:  Abnormal gait, Decreased scar mobility, Decreased activity tolerance, Decreased strength, Pain, Decreased balance, Decreased mobility, Difficulty walking, Improper body mechanics, Decreased coordination, Decreased safety awareness, Postural dysfunction  Visit Diagnosis: Pain in left hip  Muscle weakness (generalized)  Difficulty in walking, not elsewhere classified  Unsteadiness on feet     Problem List Patient Active Problem List   Diagnosis  Date Noted  . Essential hypertension 02/10/2016  . Former smoker 02/01/2016  . Anemia, unspecified 02/01/2016  . Osteoarthritis of left hip   . GERD (gastroesophageal reflux disease) 04/02/2013  . Chronic bronchitis with acute exacerbation ([place]) 04/02/2013  . History of ovarian cancer 04/02/2013  . Spinal stenosis, lumbar region, with neurogenic claudication 02/27/2013  . Iliotibial band tendonitis 02/27/2013  . DEGENERATIVE DISC DISEASE, LUMBOSACRAL SPINE 12/09/2009  . H N P-LUMBAR 09/29/2009  . KNEE, ARTHRITIS, DEGEN./OSTEO 09/23/2008  . SCIATICA 05/14/2008  . Insulin dependent diabetes mellitus ([place]) 05/12/2008   9:09 AM,03/01/2016 Elly Modena PT, DPT [facility] [address] [place], [place], [address] Phone: [phone]   Fax:  [phone]  Name: [name] MRN: [id number] Date of Birth: 11-05-1950

## 2016-03-06 ENCOUNTER — Ambulatory Visit (HOSPITAL_COMMUNITY): Payer: Medicare Other | Admitting: Physical Therapy

## 2016-03-06 DIAGNOSIS — M25552 Pain in left hip: Secondary | ICD-10-CM | POA: Diagnosis not present

## 2016-03-06 DIAGNOSIS — M6281 Muscle weakness (generalized): Secondary | ICD-10-CM

## 2016-03-06 DIAGNOSIS — R262 Difficulty in walking, not elsewhere classified: Secondary | ICD-10-CM | POA: Diagnosis not present

## 2016-03-06 DIAGNOSIS — R2681 Unsteadiness on feet: Secondary | ICD-10-CM | POA: Diagnosis not present

## 2016-03-06 NOTE — Therapy (Signed)
[facility] [address] [place], [place], [address] Phone: [phone]   Fax:  [phone]  Physical Therapy Treatment  Patient Details  Name: [name] MRN: [id number] Date of Birth: 16-Dec-1950 Referring Provider: Arther Abbott   Encounter Date: 03/06/2016      PT End of Session - 03/06/16 1202    Visit Number 3   Number of Visits 18   Date for PT Re-Evaluation 03/20/16   Authorization Type [employer]    Authorization Time Period 02/28/16 to 04/10/16   Authorization - Visit Number 3   Authorization - Number of Visits 10   PT Start Time 1115   PT Stop Time 1157   PT Time Calculation (min) 42 min   Activity Tolerance Patient tolerated treatment well   Behavior During Therapy [facility] for tasks assessed/performed      Past Medical History  Diagnosis Date  . Diabetes mellitus     Insulin-dependent diabetes  . Hypertension   . Cancer ([place])     ovarian  . COPD (chronic obstructive pulmonary disease) ([place])   . Degenerative joint disease   . Carpal tunnel syndrome     Nocturnal and positional, bilateral    Past Surgical History  Procedure Laterality Date  . Replacement total knee      bilateral  . Cholecystectomy    . Colonoscopy  2007    Dr. Gala Romney: internal hemorrhoids, few scattered sigmoid diverticula  . Esophagogastroduodenoscopy  2008    Dr. Gala Romney: erosive esophagitis, patulous EG junction  . Colonoscopy N/A 04/09/2014    [id number] diverticulosis. Redundant colon. Single colonic polyp-removed as described above.  . Esophagogastroduodenoscopy N/A 04/09/2014    [id number] reflux esophagitis. Schatzki's ring-status post dilation as described above. Hiatal hernia. Gastricerosions-status post gastric biopsy  . Savory dilation N/A 04/09/2014    Procedure: SAVORY DILATION;  Surgeon: Daneil Dolin, MD;  [facility];  Service: Endoscopy;  Laterality: N/A;  Venia Minks dilation N/A 04/09/2014    Procedure:  Venia Minks DILATION;  Surgeon: Daneil Dolin, MD;  [facility];  Service: Endoscopy;  Laterality: N/A;  . Abdominal hysterectomy      BSO  . Abdominal surgery      for bleeding after hysterectomy  . Left hip replacement  12/08/2015  . Total hip arthroplasty Left 12/08/2015    Procedure: TOTAL HIP ARTHROPLASTY;  Surgeon: Carole Civil, MD;  Location: [facility] ORS;  Service: Orthopedics;  Laterality: Left;  . Hip closed reduction Left 01/15/2016    Procedure: CLOSED REDUCTION LEFT HIP PROSTHESIS;  Surgeon: Carole Civil, MD;  Location: [facility] ORS;  Service: Orthopedics;  Laterality: Left;  . Total hip revision Left 01/26/2016    Procedure: LEFT TOTAL HIP REVISION;  Surgeon: Carole Civil, MD;  Location: [facility] ORS;  Service: Orthopedics;  Laterality: Left;  . Toenail excision      R great toenail; artificial nail    There were no vitals filed for this visit.      Subjective Assessment - 03/06/16 1119    Subjective Patient arrives reporting that she had an off weekend, just wasn't feeling good. Having some soreness L hip but feeling OK otherwise. No falls or close calls otherwise.    Currently in Pain? Yes   Pain Score 2    Pain Location Hip   Pain Orientation Left   Pain Descriptors / Indicators Sore;Tightness   Pain Type Surgical pain   Pain Radiating Towards none  Pain Onset 1 to 4 weeks ago   Pain Frequency Constant   Aggravating Factors  none    Pain Relieving Factors none    Effect of Pain on Daily Activities cannot get back to PLOF                          [facility] Adult PT Treatment/Exercise - 03/06/16 0001    Ambulation/Gait   Gait Comments gait x265ft with heel-toe pattern, cnotinues to demonstrate antalgic gait    Knee/Hip Exercises: Standing   Heel Raises 20 reps;Both   Heel Raises Limitations heel and toe   Forward Lunges 1 set;Left;15 reps   Forward Lunges Limitations 4 inch box    Forward Step Up Both;1 set;15 reps   Forward Step Up  Limitations 4 inch box    Rocker Board 2 minutes   Rocker Board Limitations AP and latearl U HHA    Knee/Hip Exercises: Seated   Long Arc Quad Both;1 set;10 reps   Long Arc Quad Weight 2 lbs.   Heel Slides Both;1 set;10 reps   Heel Slides Limitations 2#    Sit to Sand 10 reps  cues for equal weight shift use of 2 inch box under R LE to facilitate weight shift to L LE                 PT Education - 03/06/16 1202    Education provided No          PT Short Term Goals - 02/28/16 1540    PT SHORT TERM GOAL #1   Title Patient will demonstrate improved gait mechanics, including proper heel-toe pattern, improved posture, and improved gait speed with no assistive device in order to demonstrate improved mobility    Time 3   Period Weeks   Status New   PT SHORT TERM GOAL #2   Title Patient will be able to perform TUG in 10 seconds or less with LRAD in order to demonstrate improved mobility and balance    Time 3   Period Weeks   Status New   PT SHORT TERM GOAL #3   Title Patient to be independent in techniques such as scar massage and correct application of ice in order to enhance self efficacy in managing condition    Time 3   Period Weeks   Status New   PT SHORT TERM GOAL #4   Title Patient to be indepndent in correctly and consistently performing advanced HEP, to be updated PRN    Time 3   Period Weeks   Status New           PT Long Term Goals - 02/28/16 1604    PT LONG TERM GOAL #1   Title Patient to demonstrate strength 4+/5 in all tested muscle groups in order to assist in improving mobility    Time 6   Period Weeks   Status New   PT LONG TERM GOAL #2   Title Patient to be able to ambulate at least 1228ft during 6MWT with no device in order to demonstrate improved mobilty and to faclitate navigation of community    Time 6   Period Weeks   Status New   PT LONG TERM GOAL #3   Title Patient to be particpatory in regular exercise program, at least 30 minutes  in duration, at least 3 days per week, in order to assist in improving overall health status and to maintain functional gains  Time 6   Period Weeks   Status New               Plan - 03/06/16 1203    Clinical Impression Statement Continued to focus on functional strengthening and gait training today, noting that patient continues to shift weight away from sore L LE with sit to stand activities as well as continuing to demonstrate antalgic pattern with gait. Performed exercises within precautions today, noting that patient does fatigue easily and does require cues for form throughout session. Explained to patient that her ongoing difficulties with gait and balance are likely due to fact that lateral surgery requires surgeon to cut through hip ABD groups, which can strongly effect gait/balance/mobility as a whole.    Rehab Potential Good   PT Frequency 3x / week   PT Duration 6 weeks   PT Treatment/Interventions ADLs/Self Care Home Management;Cryotherapy;Moist Heat;DME Instruction;Gait training;Stair training;Functional mobility training;Therapeutic activities;Therapeutic exercise;Balance training;Neuromuscular re-education;Patient/family education;Manual techniques;Scar mobilization;Energy conservation;Taping   PT Next Visit Plan gait training; functional strengthening within precautions; balance training as needed   Consulted and Agree with Plan of Care Patient      Patient will benefit from skilled therapeutic intervention in order to improve the following deficits and impairments:  Abnormal gait, Decreased scar mobility, Decreased activity tolerance, Decreased strength, Pain, Decreased balance, Decreased mobility, Difficulty walking, Improper body mechanics, Decreased coordination, Decreased safety awareness, Postural dysfunction  Visit Diagnosis: Pain in left hip  Muscle weakness (generalized)  Difficulty in walking, not elsewhere classified  Unsteadiness on  feet     Problem List Patient Active Problem List   Diagnosis Date Noted  . Essential hypertension 02/10/2016  . Former smoker 02/01/2016  . Anemia, unspecified 02/01/2016  . Osteoarthritis of left hip   . GERD (gastroesophageal reflux disease) 04/02/2013  . Chronic bronchitis with acute exacerbation ([place]) 04/02/2013  . History of ovarian cancer 04/02/2013  . Spinal stenosis, lumbar region, with neurogenic claudication 02/27/2013  . Iliotibial band tendonitis 02/27/2013  . DEGENERATIVE DISC DISEASE, LUMBOSACRAL SPINE 12/09/2009  . H N P-LUMBAR 09/29/2009  . KNEE, ARTHRITIS, DEGEN./OSTEO 09/23/2008  . SCIATICA 05/14/2008  . Insulin dependent diabetes mellitus ([place]) 05/12/2008    Deniece Ree PT, DPT [phone]  [facility] [address] [place], [place], [address] Phone: [phone]   Fax:  [phone]  Name: [name] MRN: [id number] Date of Birth: [birth year]

## 2016-03-08 ENCOUNTER — Ambulatory Visit (HOSPITAL_COMMUNITY): Payer: Medicare Other

## 2016-03-08 DIAGNOSIS — R2681 Unsteadiness on feet: Secondary | ICD-10-CM

## 2016-03-08 DIAGNOSIS — R262 Difficulty in walking, not elsewhere classified: Secondary | ICD-10-CM | POA: Diagnosis not present

## 2016-03-08 DIAGNOSIS — M25552 Pain in left hip: Secondary | ICD-10-CM | POA: Diagnosis not present

## 2016-03-08 DIAGNOSIS — M6281 Muscle weakness (generalized): Secondary | ICD-10-CM

## 2016-03-08 NOTE — Therapy (Signed)
[facility] [address] [place], [place], [address] Phone: [phone]   Fax:  [phone]  Physical Therapy Treatment  Patient Details  Name: CORBIN PIZZOFERRATO MRN: [id number] Date of Birth: [birth year] Referring Provider: Arther Abbott   Encounter Date: 03/08/2016      PT End of Session - 03/08/16 1457    Visit Number 4   Number of Visits 18   Date for PT Re-Evaluation 03/20/16   Authorization Type Med[employer]    Authorization Time Period 02/28/16 to 04/10/16   Authorization - Visit Number 4   Authorization - Number of Visits 10   PT Start Time [id number]   PT Stop Time 1515   PT Time Calculation (min) 38 min   Equipment Utilized During Treatment Gait belt   Activity Tolerance Patient tolerated treatment well   Behavior During Therapy [facility] for tasks assessed/performed      Past Medical History  Diagnosis Date  . Diabetes mellitus     Insulin-dependent diabetes  . Hypertension   . Cancer ([place])     ovarian  . COPD (chronic obstructive pulmonary disease) ([place])   . Degenerative joint disease   . Carpal tunnel syndrome     Nocturnal and positional, bilateral    Past Surgical History  Procedure Laterality Date  . Replacement total knee      bilateral  . Cholecystectomy    . Colonoscopy  2007    Dr. Gala Romney: internal hemorrhoids, few scattered sigmoid diverticula  . Esophagogastroduodenoscopy  2008    Dr. Gala Romney: erosive esophagitis, patulous EG junction  . Colonoscopy N/A 04/09/2014    [id number] diverticulosis. Redundant colon. Single colonic polyp-removed as described above.  . Esophagogastroduodenoscopy N/A 04/09/2014    [id number] reflux esophagitis. Schatzki's ring-status post dilation as described above. Hiatal hernia. Gastricerosions-status post gastric biopsy  . Savory dilation N/A 04/09/2014    Procedure: SAVORY DILATION;  Surgeon: Daneil Dolin, MD;  [facility];  Service: Endoscopy;  Laterality: N/A;  Venia Minks dilation N/A 04/09/2014    Procedure: Venia Minks DILATION;  Surgeon: Daneil Dolin, MD;  [facility];  Service: Endoscopy;  Laterality: N/A;  . Abdominal hysterectomy      BSO  . Abdominal surgery      for bleeding after hysterectomy  . Left hip replacement  12/08/2015  . Total hip arthroplasty Left 12/08/2015    Procedure: TOTAL HIP ARTHROPLASTY;  Surgeon: Carole Civil, MD;  Location: [facility] ORS;  Service: Orthopedics;  Laterality: Left;  . Hip closed reduction Left 01/15/2016    Procedure: CLOSED REDUCTION LEFT HIP PROSTHESIS;  Surgeon: Carole Civil, MD;  Location: [facility];  Service: Orthopedics;  Laterality: Left;  . Total hip revision Left 01/26/2016    Procedure: LEFT TOTAL HIP REVISION;  Surgeon: Carole Civil, MD;  Location: [facility];  Service: Orthopedics;  Laterality: Left;  . Toenail excision      R great toenail; artificial nail    There were no vitals filed for this visit.      Subjective Assessment - 03/08/16 1442    Subjective Nor[place]inf new since last time.  No falls or close calls.  Pt states that she has no pain, just sore. Pt states that she has had difficulty doing the bridges on her bed because the mattress is soft.  Pt is in the process of moving in with her son until she is able to find another place.     Currently in  Pain? No/denies                         [facility] Adult PT Treatment/Exercise - 03/08/16 0001    Ambulation/Gait   Gait Comments gait x 71ft with no AD - tc's to correct lateral sway, but pt not able to maintain, SPC used for an additional 129ft with focus on using core to stabilize   Knee/Hip Exercises: Standing   Forward Step Up Both;15 reps   Forward Step Up Limitations 4 inch box    Step Down Both;10 reps;2 sets  4 in step   Step Down Limitations B UE support   Knee/Hip Exercises: Seated   Long Arc Quad Strengthening;Both;2 sets;10 reps   Long Arc Quad Weight 2 lbs.   Heel Slides Left;2 sets;10 reps  3#    Sit to Sand 5 reps;3 sets  Arms crossed over chest   Knee/Hip Exercises: Supine   Bridges Limitations 2x 15             Balance Exercises - 03/08/16 1524    Balance Exercises: Standing   Standing Eyes Closed Wide (BOA);Narrow base of support (BOS);Foam/compliant surface;Other (comment)  1 minute each.  2 x's narrow base of support           PT Education - 03/08/16 1528    Education provided Yes   Education Details Encouraged pt to continue with HEP, and to take note when ambulating - trying to diminish lateral sway and trendelenburg.     Person(s) Educated Patient   Methods Explanation   Comprehension Verbalized understanding          PT Short Term Goals - 03/08/16 1459    PT SHORT TERM GOAL #1   Title Patient will demonstrate improved gait mechanics, including proper heel-toe pattern, improved posture, and improved gait speed with no assistive device in order to demonstrate improved mobility    Time 3   Period Weeks   Status New   PT SHORT TERM GOAL #2   Title Patient will be able to perform TUG in 10 seconds or less with LRAD in order to demonstrate improved mobility and balance    Time 3   Period Weeks   Status New   PT SHORT TERM GOAL #3   Title Patient to be independent in techniques such as scar massage and correct application of ice in order to enhance self efficacy in managing condition    Time 3   Period Weeks   Status New   PT SHORT TERM GOAL #4   Title Patient to be indepndent in correctly and consistently performing advanced HEP, to be updated PRN    Time 3   Period Weeks   Status New           PT Long Term Goals - 03/08/16 1534    PT LONG TERM GOAL #1   Title Patient to demonstrate strength 4+/5 in all tested muscle groups in order to assist in improving mobility    Time 6   Status New   PT LONG TERM GOAL #2   Title Patient to be able to ambulate at least 1270ft during 6MWT with no device in order to demonstrate improved mobilty and to  faclitate navigation of community    Time 6   Period Weeks   Status New   PT LONG TERM GOAL #3   Title Patient to be particpatory in regular exercise program, at least 30 minutes in duration,  at least 3 days per week, in order to assist in improving overall health status and to maintain functional gains    Time 6   Period Weeks   Status New               Plan - 03/08/16 1458    Clinical Impression Statement Continued to progress B LE strength at this time as she continues to have a lateral weight shift with gait.  This seems to have diminished with her transfers and bridges today.  Progress with some balance activities including wide stance and narrow stance on foam and she was able to hodl for 1 minute without any UE support.  Pt states she returns to see Dr. Aline Brochure for her 6 week check up on July 5th, and is going to ask if she can remove the wedge when she is sleeping at night.  Pt's greatest limitation continues to be L hip abductor weakness.    Rehab Potential Good   PT Frequency 3x / week   PT Duration 6 weeks   PT Treatment/Interventions ADLs/Self Care Home Management;Cryotherapy;Moist Heat;DME Instruction;Gait training;Stair training;Functional mobility training;Therapeutic activities;Therapeutic exercise;Balance training;Neuromuscular re-education;Patient/family education;Manual techniques;Scar mobilization;Energy conservation;Taping   PT Next Visit Plan gait training - emphasize increased hip flexion and decreased lateral sway; functional strengthening within precautions; balance training as needed, standing marches within precautions   PT Home Exercise Plan continue previous    Consulted and Agree with Plan of Care Patient      Patient will benefit from skilled therapeutic intervention in order to improve the following deficits and impairments:  Abnormal gait, Decreased scar mobility, Decreased activity tolerance, Decreased strength, Pain, Decreased balance, Decreased  mobility, Difficulty walking, Improper body mechanics, Decreased coordination, Decreased safety awareness, Postural dysfunction  Visit Diagnosis: Pain in left hip  Muscle weakness (generalized)  Difficulty in walking, not elsewhere classified  Unsteadiness on feet     Problem List Patient Active Problem List   Diagnosis Date Noted  . Essential hypertension 02/10/2016  . Former smoker 02/01/2016  . Anemia, unspecified 02/01/2016  . Osteoarthritis of left hip   . GERD (gastroesophageal reflux disease) 04/02/2013  . Chronic bronchitis with acute exacerbation ([place]) 04/02/2013  . History of ovarian cancer 04/02/2013  . Spinal stenosis, lumbar region, with neurogenic claudication 02/27/2013  . Iliotibial band tendonitis 02/27/2013  . DEGENERATIVE DISC DISEASE, LUMBOSACRAL SPINE 12/09/2009  . H N P-LUMBAR 09/29/2009  . KNEE, ARTHRITIS, DEGEN./OSTEO 09/23/2008  . SCIATICA 05/14/2008  . Insulin dependent diabetes mellitus ([place]) 05/12/2008    Beth Aubrionna Istre, PT, DPT X: [phone]   [facility] [address] [place], [place], [address] Phone: [phone]   Fax:  [phone]  Name: CORENE SMYLY MRN: [id number] Date of Birth: 1951-02-17

## 2016-03-13 ENCOUNTER — Ambulatory Visit (HOSPITAL_COMMUNITY): Payer: Medicare Other | Admitting: Physical Therapy

## 2016-03-13 DIAGNOSIS — R262 Difficulty in walking, not elsewhere classified: Secondary | ICD-10-CM | POA: Diagnosis not present

## 2016-03-13 DIAGNOSIS — M6281 Muscle weakness (generalized): Secondary | ICD-10-CM

## 2016-03-13 DIAGNOSIS — M25552 Pain in left hip: Secondary | ICD-10-CM

## 2016-03-13 DIAGNOSIS — R2681 Unsteadiness on feet: Secondary | ICD-10-CM

## 2016-03-13 NOTE — Therapy (Signed)
[facility] [address] [place], [place], [address] Phone: [phone]   Fax:  [phone]  Physical Therapy Treatment  Patient Details  Name: [name] MRN: [id number] Date of Birth: [birth year] Referring Provider: Arther Abbott   Encounter Date: 03/13/2016      PT End of Session - 03/13/16 0917    Visit Number 5   Number of Visits 18   Date for PT Re-Evaluation 03/20/16   Authorization Type [employer]    Authorization Time Period 02/28/16 to 04/10/16   Authorization - Visit Number 4   Authorization - Number of Visits 10   PT Start Time 0815   PT Stop Time 0858   PT Time Calculation (min) 43 min   Equipment Utilized During Treatment Gait belt   Activity Tolerance Patient tolerated treatment well   Behavior During Therapy [facility] for tasks assessed/performed      Past Medical History  Diagnosis Date  . Diabetes mellitus     Insulin-dependent diabetes  . Hypertension   . Cancer ([place])     ovarian  . COPD (chronic obstructive pulmonary disease) ([place])   . Degenerative joint disease   . Carpal tunnel syndrome     Nocturnal and positional, bilateral    Past Surgical History  Procedure Laterality Date  . Replacement total knee      bilateral  . Cholecystectomy    . Colonoscopy  2007    Dr. Gala Romney: internal hemorrhoids, few scattered sigmoid diverticula  . Esophagogastroduodenoscopy  2008    Dr. Gala Romney: erosive esophagitis, patulous EG junction  . Colonoscopy N/A 04/09/2014    [id number] diverticulosis. Redundant colon. Single colonic polyp-removed as described above.  . Esophagogastroduodenoscopy N/A 04/09/2014    [id number] reflux esophagitis. Schatzki's ring-status post dilation as described above. Hiatal hernia. Gastricerosions-status post gastric biopsy  . Savory dilation N/A 04/09/2014    Procedure: SAVORY DILATION;  Surgeon: Daneil Dolin, MD;  [facility];  Service: Endoscopy;  Laterality: N/A;  Venia Minks dilation N/A 04/09/2014    Procedure: Venia Minks DILATION;  Surgeon: Daneil Dolin, MD;  [facility];  Service: Endoscopy;  Laterality: N/A;  . Abdominal hysterectomy      BSO  . Abdominal surgery      for bleeding after hysterectomy  . Left hip replacement  12/08/2015  . Total hip arthroplasty Left 12/08/2015    Procedure: TOTAL HIP ARTHROPLASTY;  Surgeon: Carole Civil, MD;  Location: [facility];  Service: Orthopedics;  Laterality: Left;  . Hip closed reduction Left 01/15/2016    Procedure: CLOSED REDUCTION LEFT HIP PROSTHESIS;  Surgeon: Carole Civil, MD;  Location: [facility];  Service: Orthopedics;  Laterality: Left;  . Total hip revision Left 01/26/2016    Procedure: LEFT TOTAL HIP REVISION;  Surgeon: Carole Civil, MD;  Location: [facility];  Service: Orthopedics;  Laterality: Left;  . Toenail excision      R great toenail; artificial nail    There were no vitals filed for this visit.      Subjective Assessment - 03/13/16 0816    Subjective Pt states she has not been doing her exercises. She has been trying to prepare for a move and feels she is doing enough with that. She states her hip is doing ok. She has been looking at homes with steps and feels she needs to work on those.    Pertinent History beta-blockers, DM, COPD, history of CA, B TKR, L hip replacement  Patient Stated Goals be able to walk without cane   Currently in Pain? No/denies                         [facility] Adult PT Treatment/Exercise - 03/13/16 0001    Ambulation/Gait   Gait Comments ascending/descending steps with B handrails, using step to pattern. Attempted leading with LLE, but pt unable at this time secondary to weakness. x4 trials    Knee/Hip Exercises: Seated   Sit to Sand with UE support;3 sets, cue for correct technique initially with 4" box under RLE and with UE support, progressed to without box and UE support on thighs   Knee/Hip Exercises: Supine   Bridges  Limitations 3x15  red TB around knees   Knee/Hip Exercises: Sidelying   Clams LLE only 2x15, pillow between knees   Manual Therapy   Manual Therapy Soft tissue mobilization   Manual therapy comments separate from all other interventions   Soft tissue mobilization along surgical incision x4 min                PT Education - 03/13/16 0838    Education provided Yes   Education Details discussed importance of HEP adherence; importance of correct technique with sit to stand; stair technique using step to pattern; scar massage   Person(s) Educated Patient   Methods Explanation;Demonstration   Comprehension Verbalized understanding;Returned demonstration;Need further instruction  further instruction with sit to stand          PT Short Term Goals - 03/08/16 1459    PT SHORT TERM GOAL #1   Title Patient will demonstrate improved gait mechanics, including proper heel-toe pattern, improved posture, and improved gait speed with no assistive device in order to demonstrate improved mobility    Time 3   Period Weeks   Status New   PT SHORT TERM GOAL #2   Title Patient will be able to perform TUG in 10 seconds or less with LRAD in order to demonstrate improved mobility and balance    Time 3   Period Weeks   Status New   PT SHORT TERM GOAL #3   Title Patient to be independent in techniques such as scar massage and correct application of ice in order to enhance self efficacy in managing condition    Time 3   Period Weeks   Status New   PT SHORT TERM GOAL #4   Title Patient to be indepndent in correctly and consistently performing advanced HEP, to be updated PRN    Time 3   Period Weeks   Status New           PT Long Term Goals - 03/08/16 1534    PT LONG TERM GOAL #1   Title Patient to demonstrate strength 4+/5 in all tested muscle groups in order to assist in improving mobility    Time 6   Status New   PT LONG TERM GOAL #2   Title Patient to be able to ambulate at least  1228ft during 6MWT with no device in order to demonstrate improved mobilty and to faclitate navigation of community    Time 6   Period Weeks   Status New   PT LONG TERM GOAL #3   Title Patient to be particpatory in regular exercise program, at least 30 minutes in duration, at least 3 days per week, in order to assist in improving overall health status and to maintain functional gains    Time  [place] - 03/13/16 [id number]    Clinical Impression Statement Pt continues to demonstrate Lt hip weakness greater than the Rt which is evident by increased difficulty with stair negotiation and sit to stand. I spent quite a bit of time working on proper sit to stand technique with pt able to return correct demonstration of decreased weight shift to the Rt with verbal feedback only. Ended the session with scar massage with no report of increased pain by the end of the session. Will continue with current POC.   Rehab Potential Good   PT Frequency 3x / week   PT Duration 6 weeks   PT Treatment/Interventions ADLs/Self Care Home Management;Cryotherapy;Moist Heat;DME Instruction;Gait training;Stair training;Functional mobility training;Therapeutic activities;Therapeutic exercise;Balance training;Neuromuscular re-education;Patient/family education;Manual techniques;Scar mobilization;Energy conservation;Taping   PT Next Visit Plan gait training - emphasize increased hip flexion and decreased lateral sway; work on sit to stand with equal weight shift, she has significant shift to the Rt; functional strengthening within precautions; balance training as needed, standing marches within precautions   PT Home Exercise Plan no updates, but need to encourage full adherence   Consulted and Agree with Plan of Care Patient      Patient will benefit from skilled therapeutic intervention in order to improve the following deficits and impairments:  Abnormal gait, Decreased scar  mobility, Decreased activity tolerance, Decreased strength, Pain, Decreased balance, Decreased mobility, Difficulty walking, Improper body mechanics, Decreased coordination, Decreased safety awareness, Postural dysfunction  Visit Diagnosis: Pain in left hip  Muscle weakness (generalized)  Difficulty in walking, not elsewhere classified  Unsteadiness on feet     Problem List Patient Active Problem List   Diagnosis Date Noted  . Essential hypertension 02/10/2016  . Former smoker 02/01/2016  . Anemia, unspecified 02/01/2016  . Osteoarthritis of left hip   . GERD (gastroesophageal reflux disease) 04/02/2013  . Chronic bronchitis with acute exacerbation ([place]) 04/02/2013  . History of ovarian cancer 04/02/2013  . Spinal stenosis, lumbar region, with neurogenic claudication 02/27/2013  . Iliotibial band tendonitis 02/27/2013  . DEGENERATIVE DISC DISEASE, LUMBOSACRAL SPINE 12/09/2009  . H N P-LUMBAR 09/29/2009  . KNEE, ARTHRITIS, DEGEN./OSTEO 09/23/2008  . SCIATICA 05/14/2008  . Insulin dependent diabetes mellitus ([place]) 05/12/2008   9:26 AM,03/13/2016 Elly Modena PT, DPT [facility] [address] [place], [place], [address] Phone: [phone]   Fax:  [phone]  Name: [name] MRN: [id number] Date of Birth: 30-Mar-1951

## 2016-03-15 ENCOUNTER — Encounter (HOSPITAL_COMMUNITY): Payer: Self-pay | Admitting: Physical Therapy

## 2016-03-15 ENCOUNTER — Telehealth (HOSPITAL_COMMUNITY): Payer: Self-pay

## 2016-03-15 DIAGNOSIS — E118 Type 2 diabetes mellitus with unspecified complications: Secondary | ICD-10-CM | POA: Diagnosis not present

## 2016-03-15 DIAGNOSIS — J449 Chronic obstructive pulmonary disease, unspecified: Secondary | ICD-10-CM | POA: Diagnosis not present

## 2016-03-15 DIAGNOSIS — I10 Essential (primary) hypertension: Secondary | ICD-10-CM | POA: Diagnosis not present

## 2016-03-15 NOTE — Telephone Encounter (Signed)
03/15/16 left a message that she wouldn't be able to come

## 2016-03-17 ENCOUNTER — Ambulatory Visit (HOSPITAL_COMMUNITY): Payer: Medicare Other | Admitting: Physical Therapy

## 2016-03-17 DIAGNOSIS — R2681 Unsteadiness on feet: Secondary | ICD-10-CM | POA: Diagnosis not present

## 2016-03-17 DIAGNOSIS — M6281 Muscle weakness (generalized): Secondary | ICD-10-CM

## 2016-03-17 DIAGNOSIS — M25552 Pain in left hip: Secondary | ICD-10-CM

## 2016-03-17 DIAGNOSIS — R262 Difficulty in walking, not elsewhere classified: Secondary | ICD-10-CM

## 2016-03-17 NOTE — Therapy (Signed)
[facility] [place], [place], [address] Phone: [phone]   Fax:  [phone]  Physical Therapy Treatment  Patient Details  Name: [name] MRN: [id number] Date of Birth: 1951-06-21 Referring Provider: Arther Abbott   Encounter Date: 03/17/2016      PT End of Session - 03/17/16 1211    Visit Number 6   Number of Visits 18   Date for PT Re-Evaluation 03/20/16   Authorization Type Med[employer]are    Authorization Time Period 02/28/16 to 04/10/16   Authorization - Visit Number 6   Authorization - Number of Visits 10   PT Start Time 0815   PT Stop Time 0859   PT Time Calculation (min) 44 min   Equipment Utilized During Treatment Gait belt   Activity Tolerance Patient tolerated treatment well   Behavior During Therapy [facility] for tasks assessed/performed      Past Medical History  Diagnosis Date  . Diabetes mellitus     Insulin-dependent diabetes  . Hypertension   . Cancer ([place])     ovarian  . COPD (chronic obstructive pulmonary disease) ([place])   . Degenerative joint disease   . Carpal tunnel syndrome     Nocturnal and positional, bilateral    Past Surgical History  Procedure Laterality Date  . Replacement total knee      bilateral  . Cholecystectomy    . Colonoscopy  2007    Dr. Gala Romney: internal hemorrhoids, few scattered sigmoid diverticula  . Esophagogastroduodenoscopy  2008    Dr. Gala Romney: erosive esophagitis, patulous EG junction  . Colonoscopy N/A 04/09/2014    [id number] diverticulosis. Redundant colon. Single colonic polyp-removed as described above.  . Esophagogastroduodenoscopy N/A 04/09/2014    [id number] reflux esophagitis. Schatzki's ring-status post dilation as described above. Hiatal hernia. Gastricerosions-status post gastric biopsy  . Savory dilation N/A 04/09/2014    Procedure: SAVORY DILATION;  Surgeon: Daneil Dolin, MD;  [facility];  Service: Endoscopy;  Laterality: N/A;  Venia Minks dilation N/A 04/09/2014    Procedure: Venia Minks DILATION;  Surgeon: Daneil Dolin, MD;  [facility];  Service: Endoscopy;  Laterality: N/A;  . Abdominal hysterectomy      BSO  . Abdominal surgery      for bleeding after hysterectomy  . Left hip replacement  12/08/2015  . Total hip arthroplasty Left 12/08/2015    Procedure: TOTAL HIP ARTHROPLASTY;  Surgeon: Carole Civil, MD;  Location: [facility];  Service: Orthopedics;  Laterality: Left;  . Hip closed reduction Left 01/15/2016    Procedure: CLOSED REDUCTION LEFT HIP PROSTHESIS;  Surgeon: Carole Civil, MD;  Location: [facility];  Service: Orthopedics;  Laterality: Left;  . Total hip revision Left 01/26/2016    Procedure: LEFT TOTAL HIP REVISION;  Surgeon: Carole Civil, MD;  Location: [facility];  Service: Orthopedics;  Laterality: Left;  . Toenail excision      R great toenail; artificial nail    There were no vitals filed for this visit.      Subjective Assessment - 03/17/16 0817    Subjective Pt states she is still looking for her house. She has a friend helping her pack so that is nice. She goes back to the surgeon 03/22/16. She worked on her sitting and standing yesterday.   Pertinent History beta-blockers, DM, COPD, history of CA, B TKR, L hip replacement    Patient Stated Goals be able to walk without cane   Currently  in Pain? No/denies                         [facility] Adult PT Treatment/Exercise - 03/17/16 0001    Ambulation/Gait   Gait Comments walking along blue line with RLE step over small hurdles to improve LLE stance time, x5RT, verbal cues provided to decrease lateral trunk lean during activity.    Knee/Hip Exercises: Standing   Hip Flexion Right;2 sets;20 reps  BUE down to 1 UE   Hip Flexion Limitations 8" box   Forward Step Up Left;2 sets;Step Height: 6";15 reps;Hand Hold: 2  (+) trendelenburg   Wall Squat 2 sets;20 reps   Manual Therapy   Manual Therapy Soft tissue mobilization    Manual therapy comments separate from all other interventions   Soft tissue mobilization STM along surgical incision                PT Education - 03/17/16 0944    Education provided Yes   Education Details encouraged continued HEP adherence; implications for scar massage; proper gait pattern and sit to stand    Person(s) Educated Patient   Methods Demonstration;Explanation;Other (comment)  mirror feedback   Comprehension Verbalized understanding;Returned demonstration;Need further instruction          PT Short Term Goals - 03/08/16 1459    PT SHORT TERM GOAL #1   Title Patient will demonstrate improved gait mechanics, including proper heel-toe pattern, improved posture, and improved gait speed with no assistive device in order to demonstrate improved mobility    Time 3   Period Weeks   Status New   PT SHORT TERM GOAL #2   Title Patient will be able to perform TUG in 10 seconds or less with LRAD in order to demonstrate improved mobility and balance    Time 3   Period Weeks   Status New   PT SHORT TERM GOAL #3   Title Patient to be independent in techniques such as scar massage and correct application of ice in order to enhance self efficacy in managing condition    Time 3   Period Weeks   Status New   PT SHORT TERM GOAL #4   Title Patient to be indepndent in correctly and consistently performing advanced HEP, to be updated PRN    Time 3   Period Weeks   Status New           PT Long Term Goals - 03/08/16 1534    PT LONG TERM GOAL #1   Title Patient to demonstrate strength 4+/5 in all tested muscle groups in order to assist in improving mobility    Time 6   Status New   PT LONG TERM GOAL #2   Title Patient to be able to ambulate at least 1262ft during 6MWT with no device in order to demonstrate improved mobilty and to faclitate navigation of community    Time 6   Period Weeks   Status New   PT LONG TERM GOAL #3   Title Patient to be particpatory in  regular exercise program, at least 30 minutes in duration, at least 3 days per week, in order to assist in improving overall health status and to maintain functional gains    Time 6   Period Weeks   Status New               Plan - 03/17/16 1212    Clinical Impression Statement Pt continues to demonstrate Lt  hip weakness evident during ambulation and other functional tasks by noted lateral lean. Session focused some on gait training to improve LLE stance time with some noted improvement by the end of the session. Also addressed scar mobility and adhesions via manual techniques with pt reporting pain alleviation by the end of her session. Will continue with current POC.     Rehab Potential Good   PT Frequency 3x / week   PT Duration 6 weeks   PT Treatment/Interventions ADLs/Self Care Home Management;Cryotherapy;Moist Heat;DME Instruction;Gait training;Stair training;Functional mobility training;Therapeutic activities;Therapeutic exercise;Balance training;Neuromuscular re-education;Patient/family education;Manual techniques;Scar mobilization;Energy conservation;Taping   PT Next Visit Plan scar mobility; gait training - emphasize increased hip flexion and decreased lateral sway; work on sit to stand with equal weight shift, she has significant shift to the Rt; functional strengthening within precautions; balance training as needed, standing marches within precautions   PT Home Exercise Plan no updates, but need to encourage full adherence   Consulted and Agree with Plan of Care Patient      Patient will benefit from skilled therapeutic intervention in order to improve the following deficits and impairments:  Abnormal gait, Decreased scar mobility, Decreased activity tolerance, Decreased strength, Pain, Decreased balance, Decreased mobility, Difficulty walking, Improper body mechanics, Decreased coordination, Decreased safety awareness, Postural dysfunction  Visit Diagnosis: Pain in left  hip  Muscle weakness (generalized)  Difficulty in walking, not elsewhere classified  Unsteadiness on feet     Problem List Patient Active Problem List   Diagnosis Date Noted  . Essential hypertension 02/10/2016  . Former smoker 02/01/2016  . Anemia, unspecified 02/01/2016  . Osteoarthritis of left hip   . GERD (gastroesophageal reflux disease) 04/02/2013  . Chronic bronchitis with acute exacerbation ([place]) 04/02/2013  . History of ovarian cancer 04/02/2013  . Spinal stenosis, lumbar region, with neurogenic claudication 02/27/2013  . Iliotibial band tendonitis 02/27/2013  . DEGENERATIVE DISC DISEASE, LUMBOSACRAL SPINE 12/09/2009  . H N P-LUMBAR 09/29/2009  . KNEE, ARTHRITIS, DEGEN./OSTEO 09/23/2008  . SCIATICA 05/14/2008  . Insulin dependent diabetes mellitus ([place]) 05/12/2008   12:23 PM,03/17/2016 Elly Modena PT, DPT [facility] [address] [place], [place], [address] Phone: [phone]   Fax:  [phone]  Name: [name] MRN: [id number] Date of Birth: 12-Jun-1951

## 2016-03-20 ENCOUNTER — Ambulatory Visit (HOSPITAL_COMMUNITY): Payer: Medicare Other | Attending: Orthopedic Surgery | Admitting: Physical Therapy

## 2016-03-20 DIAGNOSIS — M25552 Pain in left hip: Secondary | ICD-10-CM | POA: Diagnosis not present

## 2016-03-20 DIAGNOSIS — R262 Difficulty in walking, not elsewhere classified: Secondary | ICD-10-CM | POA: Insufficient documentation

## 2016-03-20 DIAGNOSIS — M6281 Muscle weakness (generalized): Secondary | ICD-10-CM | POA: Diagnosis not present

## 2016-03-20 DIAGNOSIS — R2681 Unsteadiness on feet: Secondary | ICD-10-CM | POA: Diagnosis not present

## 2016-03-20 NOTE — Therapy (Signed)
[place], [place], [address] Phone: [phone]   Fax:  [phone]  Physical Therapy Treatment (Re-Assessment)  Patient Details  Name: [name] MRN: [id number] Date of Birth: 1951/07/25 Referring Provider: Arther Abbott   Encounter Date: 03/20/2016      PT End of Session - 03/20/16 0856    Visit Number 7   Number of Visits 18   Date for PT Re-Evaluation 04/10/16   Authorization Type [employer] (G-code done 7th session)   Authorization Time Period 02/28/16 to 04/10/16   Authorization - Visit Number 7   Authorization - Number of Visits 17   PT Start Time 0815   PT Stop Time 2585   PT Time Calculation (min) 40 min   Activity Tolerance Patient tolerated treatment well   Behavior During Therapy [facility] for tasks assessed/performed      Past Medical History  Diagnosis Date  . Diabetes mellitus     Insulin-dependent diabetes  . Hypertension   . Cancer ([place])     ovarian  . COPD (chronic obstructive pulmonary disease) ([place])   . Degenerative joint disease   . Carpal tunnel syndrome     Nocturnal and positional, bilateral    Past Surgical History  Procedure Laterality Date  . Replacement total knee      bilateral  . Cholecystectomy    . Colonoscopy  2007    Dr. Gala Romney: internal hemorrhoids, few scattered sigmoid diverticula  . Esophagogastroduodenoscopy  2008    Dr. Gala Romney: erosive esophagitis, patulous EG junction  . Colonoscopy N/A 04/09/2014    IDP:OEUMPNT diverticulosis. Redundant colon. Single colonic polyp-removed as described above.  . Esophagogastroduodenoscopy N/A 04/09/2014    IRW:ERXVQMG reflux esophagitis. Schatzki's ring-status post dilation as described above. Hiatal hernia. Gastricerosions-status post gastric biopsy  . Savory dilation N/A 04/09/2014    Procedure: SAVORY DILATION;  Surgeon: Daneil Dolin, MD;  [facility];  Service: Endoscopy;  Laterality: N/A;  Venia Minks  dilation N/A 04/09/2014    Procedure: Venia Minks DILATION;  Surgeon: Daneil Dolin, MD;  Location: [facility];  Service: Endoscopy;  Laterality: N/A;  . Abdominal hysterectomy      BSO  . Abdominal surgery      for bleeding after hysterectomy  . Left hip replacement  12/08/2015  . Total hip arthroplasty Left 12/08/2015    Procedure: TOTAL HIP ARTHROPLASTY;  Surgeon: Carole Civil, MD;  Location: [facility] ORS;  Service: Orthopedics;  Laterality: Left;  . Hip closed reduction Left 01/15/2016    Procedure: CLOSED REDUCTION LEFT HIP PROSTHESIS;  Surgeon: Carole Civil, MD;  Location: [facility] ORS;  Service: Orthopedics;  Laterality: Left;  . Total hip revision Left 01/26/2016    Procedure: LEFT TOTAL HIP REVISION;  Surgeon: Carole Civil, MD;  Location: [facility] ORS;  Service: Orthopedics;  Laterality: Left;  . Toenail excision      R great toenail; artificial nail    There were no vitals filed for this visit.      Subjective Assessment - 03/20/16 0817    Subjective Patient reports that she feels like she is getting better overall; everything is a little bit easier with no particular thing standing out. She does feel like she is progressing a little slower than when she had the original hip done. She has not been doing a  whole lot to tell what is still hard for her.    Pertinent History beta-blockers, DM, COPD, history of CA,  B TKR, L hip replacement    How long can you sit comfortably? 7/3- unlimited    How long can you stand comfortably? 7/3- 5-10 minutes    How long can you walk comfortably? 7/3- unsure, but better    Patient Stated Goals be able to walk without cane   Currently in Pain? No/denies  just some soreness over incision             [facility] PT Assessment - 03/20/16 0001    Assessment   Medical Diagnosis hip joint replacement status    Referring Provider Arther Abbott    Onset Date/Surgical Date --  March 22nd, then May10th   Next MD Visit [facility] July 1st     Precautions   Precaution Comments lateral hip precautions    Balance Screen   Has the patient fallen in the past 6 months No   Has the patient had a decrease in activity level because of a fear of falling?  No   Is the patient reluctant to leave their home because of a fear of falling?  No   Prior Function   Level of Independence Independent;Independent with basic ADLs;Independent with gait;Independent with transfers   Vocation Retired   Leisure no hobbies    Observation/Other Assessments   Focus on Therapeutic Outcomes (FOTO)  36% limited (was 56% limited)   Strength   Right Hip Flexion 4-/5   Right Hip ABduction 3/5  hip flexor compensation    Left Hip Flexion 4-/5   Left Hip ABduction 2/5  sidelying    Right Knee Flexion 4/5   Right Knee Extension 4+/5   Left Knee Flexion 4/5   Left Knee Extension 4+/5   Right Ankle Dorsiflexion 5/5   Left Ankle Dorsiflexion 5/5   6 minute walk test results    Aerobic Endurance Distance Walked 452   Endurance additional comments 3MWT, SPC no rest    Timed Up and Go Test   Normal TUG (seconds) 15.45   TUG Comments SPC                      [facility] Adult PT Treatment/Exercise - 03/20/16 0001    Knee/Hip Exercises: Standing   Heel Raises 20 reps;Both   Heel Raises Limitations heel and toe   Other Standing Knee Exercises standing hip ABD 1x10   hip flexor compenstation noted despite cues    Knee/Hip Exercises: Supine   Other Supine Knee/Hip Exercises supine hip ABD 1x10                PT Education - 03/20/16 0855    Education provided Yes   Education Details progress with skilled PT services, POC, now able to do active hip ABD per MD instructions    Person(s) Educated Patient   Methods Explanation   Comprehension Verbalized understanding          PT Short Term Goals - 03/20/16 3382    PT SHORT TERM GOAL #1   Title Patient will demonstrate improved gait mechanics, including proper heel-toe pattern, improved  posture, and improved gait speed with no assistive device in order to demonstrate improved mobility    Baseline 7/3- improving but continues to need work    Time 3   Period Weeks   Status On-going   PT SHORT TERM GOAL #2   Title Patient will be able to perform TUG in 10 seconds or less with LRAD in order to demonstrate improved mobility and balance  Baseline 7/3- 15 with cane    Time 3   Period Weeks   Status On-going   PT SHORT TERM GOAL #3   Title Patient to be independent in techniques such as scar massage and correct application of ice in order to enhance self efficacy in managing condition    Time 3   Period Weeks   Status Achieved   PT SHORT TERM GOAL #4   Title Patient to be indepndent in correctly and consistently performing advanced HEP, to be updated PRN    Baseline 7/3- reports they are going well, she has been packing to move but states she has still been keeping up with them some days    Time 3   Period Weeks   Status Partially Met           PT Long Term Goals - 03/20/16 0841    PT LONG TERM GOAL #1   Title Patient to demonstrate strength 4+/5 in all tested muscle groups in order to assist in improving mobility    Time 6   Period Weeks   Status On-going   PT LONG TERM GOAL #2   Title Patient to be able to ambulate at least 1269f during 6MWT with no device in order to demonstrate improved mobilty and to faclitate navigation of community    Baseline 7/3- 452 during 3MWT    Time 6   Period Weeks   Status Deferred   PT LONG TERM GOAL #3   Title Patient to be particpatory in regular exercise program, at least 30 minutes in duration, at least 3 days per week, in order to assist in improving overall health status and to maintain functional gains    Baseline 7/3- has not attempted    Time 6   Period Weeks   Status On-going               Plan - 03/20/16 0851    Clinical Impression Statement Re-assessment performed today. Patient reports she is feeling  like she is moving better recently, however she does continue to be limited in how far she can walk as well as in return to PLOF based activities. Upon examination, patient reveals significant functional muscle weakness, ongoing gait impairment, and ongoing unsteadiness at this time. At this point she is clear, per MD instructions, to begin active hip ABD and this was introduced during today's session with severe weakness noted in this muscle group. At this time recommend extension of skilled PT services in order to address functional limitations and assist in reaching optimal level of function.    Rehab Potential Good   PT Frequency Other (comment)  2-3 times per week    PT Duration 3 weeks   PT Treatment/Interventions ADLs/Self Care Home Management;Cryotherapy;Moist Heat;DME Instruction;Gait training;Stair training;Functional mobility training;Therapeutic activities;Therapeutic exercise;Balance training;Neuromuscular re-education;Patient/family education;Manual techniques;Scar mobilization;Energy conservation;Taping   PT Next Visit Plan Now able to do active hip ABD. scar mobility; gait training - emphasize increased hip flexion and decreased lateral sway; work on sit to stand with equal weight shift, she has significant shift to the Rt; functional strengthening within precautions; balance training as needed, standing marches within precautions   Consulted and Agree with Plan of Care Patient      Patient will benefit from skilled therapeutic intervention in order to improve the following deficits and impairments:  Abnormal gait, Decreased scar mobility, Decreased activity tolerance, Decreased strength, Pain, Decreased balance, Decreased mobility, Difficulty walking, Improper body mechanics, Decreased coordination, Decreased safety awareness, Postural  dysfunction  Visit Diagnosis: Pain in left hip  Muscle weakness (generalized)  Difficulty in walking, not elsewhere classified  Unsteadiness on  feet       G-Codes - 04-10-2016 0857    Functional Assessment Tool Used Based on skilled clinical assessment of strenght, gait, balance    Functional Limitation Mobility: Walking and moving around   Mobility: Walking and Moving Around Current Status (S9702) At least 40 percent but less than 60 percent impaired, limited or restricted   Mobility: Walking and Moving Around Goal Status [phone]) At least 20 percent but less than 40 percent impaired, limited or restricted      Problem List Patient Active Problem List   Diagnosis Date Noted  . Essential hypertension 02/10/2016  . Former smoker 02/01/2016  . Anemia, unspecified 02/01/2016  . Osteoarthritis of left hip   . GERD (gastroesophageal reflux disease) 04/02/2013  . Chronic bronchitis with acute exacerbation ([place]) 04/02/2013  . History of ovarian cancer 04/02/2013  . Spinal stenosis, lumbar region, with neurogenic claudication 02/27/2013  . Iliotibial band tendonitis 02/27/2013  . DEGENERATIVE DISC DISEASE, LUMBOSACRAL SPINE 12/09/2009  . H N P-LUMBAR 09/29/2009  . KNEE, ARTHRITIS, DEGEN./OSTEO 09/23/2008  . SCIATICA 05/14/2008  . Insulin dependent diabetes mellitus ([place]) 05/12/2008    Deniece Ree PT, DPT [phone]  [place] [address] [place], [place], [address] Phone: [phone]   Fax:  [phone]  Name: [name] MRN: [id number] Date of Birth: 13-Jan-1951

## 2016-03-22 ENCOUNTER — Ambulatory Visit: Payer: Medicare Other | Admitting: Orthopedic Surgery

## 2016-03-22 ENCOUNTER — Encounter: Payer: Self-pay | Admitting: Orthopedic Surgery

## 2016-03-22 ENCOUNTER — Encounter (HOSPITAL_COMMUNITY): Payer: Self-pay

## 2016-03-22 VITALS — BP 154/79 | HR 64 | Temp 97.7°F | Ht 65.0 in | Wt 229.4 lb

## 2016-03-22 DIAGNOSIS — Z96649 Presence of unspecified artificial hip joint: Secondary | ICD-10-CM

## 2016-03-22 MED ORDER — HYDROCODONE-ACETAMINOPHEN 5-325 MG PO TABS: 1.0000 | ORAL_TABLET | Freq: Three times a day (TID) | ORAL | Status: DC | PRN

## 2016-03-22 NOTE — Patient Instructions (Signed)
Stop using pillow

## 2016-03-22 NOTE — Progress Notes (Signed)
Chief complaint recheck postop left revision total hip revision of the acetabulum only for dislocation  Surgery was on 01/26/2016  Patient will complete her therapy after one to 2 more visits  Complains of soreness over the left hip  Endplates with a cane out of the house and without a cane in the house  Her leg lengths are equal her hip is stable she can lift her leg approximately 115 of hip flexion  I like to see her in 4 weeks  She is on Norco for pain  Prescription

## 2016-03-24 ENCOUNTER — Ambulatory Visit (HOSPITAL_COMMUNITY): Payer: Medicare Other

## 2016-03-24 DIAGNOSIS — M6281 Muscle weakness (generalized): Secondary | ICD-10-CM

## 2016-03-24 DIAGNOSIS — M25552 Pain in left hip: Secondary | ICD-10-CM | POA: Diagnosis not present

## 2016-03-24 DIAGNOSIS — R262 Difficulty in walking, not elsewhere classified: Secondary | ICD-10-CM

## 2016-03-24 DIAGNOSIS — R2681 Unsteadiness on feet: Secondary | ICD-10-CM

## 2016-03-24 NOTE — Therapy (Signed)
[place], [place], [address] Phone: [phone]   Fax:  [phone]  Physical Therapy Treatment  Patient Details  Name: [name] MRN: [id number] Date of Birth: 12-24-1950 Referring Provider: Arther Abbott   Encounter Date: 03/24/2016      PT End of Session - 03/24/16 0820    Visit Number 8   Number of Visits 18   Date for PT Re-Evaluation 04/10/16   Authorization Type [employer] (G-code done 7th session)   Authorization Time Period 02/28/16 to 04/10/16   Authorization - Visit Number 8   Authorization - Number of Visits 17   PT Start Time [phone]   PT Stop Time 0900   PT Time Calculation (min) 44 min   Equipment Utilized During Treatment Gait belt   Activity Tolerance Patient tolerated treatment well   Behavior During Therapy [facility] for tasks assessed/performed      Past Medical History  Diagnosis Date  . Diabetes mellitus     Insulin-dependent diabetes  . Hypertension   . Cancer ([place])     ovarian  . COPD (chronic obstructive pulmonary disease) ([place])   . Degenerative joint disease   . Carpal tunnel syndrome     Nocturnal and positional, bilateral    Past Surgical History  Procedure Laterality Date  . Replacement total knee      bilateral  . Cholecystectomy    . Colonoscopy  2007    Dr. Gala Romney: internal hemorrhoids, few scattered sigmoid diverticula  . Esophagogastroduodenoscopy  2008    Dr. Gala Romney: erosive esophagitis, patulous EG junction  . Colonoscopy N/A 04/09/2014    VEL:FYBOFBP diverticulosis. Redundant colon. Single colonic polyp-removed as described above.  . Esophagogastroduodenoscopy N/A 04/09/2014    ZWC:HENIDPO reflux esophagitis. Schatzki's ring-status post dilation as described above. Hiatal hernia. Gastricerosions-status post gastric biopsy  . Savory dilation N/A 04/09/2014    Procedure: SAVORY DILATION;  Surgeon: Daneil Dolin, MD;  [facility];  Service: Endoscopy;   Laterality: N/A;  Venia Minks dilation N/A 04/09/2014    Procedure: Venia Minks DILATION;  Surgeon: Daneil Dolin, MD;  Location: [facility];  Service: Endoscopy;  Laterality: N/A;  . Abdominal hysterectomy      BSO  . Abdominal surgery      for bleeding after hysterectomy  . Left hip replacement  12/08/2015  . Total hip arthroplasty Left 12/08/2015    Procedure: TOTAL HIP ARTHROPLASTY;  Surgeon: Carole Civil, MD;  Location: [facility] ORS;  Service: Orthopedics;  Laterality: Left;  . Hip closed reduction Left 01/15/2016    Procedure: CLOSED REDUCTION LEFT HIP PROSTHESIS;  Surgeon: Carole Civil, MD;  Location: [facility] ORS;  Service: Orthopedics;  Laterality: Left;  . Total hip revision Left 01/26/2016    Procedure: LEFT TOTAL HIP REVISION;  Surgeon: Carole Civil, MD;  Location: [facility] ORS;  Service: Orthopedics;  Laterality: Left;  . Toenail excision      R great toenail; artificial nail    There were no vitals filed for this visit.      Subjective Assessment - 03/24/16 0819    Subjective Pt reports she is feeling good, no reports of pain today.     Pertinent History beta-blockers, DM, COPD, history of CA, B TKR, L hip replacement    Patient Stated Goals be able to walk without cane   Currently in Pain? No/denies             [facility] Adult PT Treatment/Exercise -  03/24/16 0001    Knee/Hip Exercises: Standing   Heel Raises 20 reps;Both   Heel Raises Limitations heel and toe   Hip Flexion Right;2 sets;20 reps   Hip Flexion Limitations marching   Lateral Step Up Left;10 reps;Hand Hold: 2;Step Height: 2";Step Height: 4"   Lateral Step Up Limitations 2in then 4in step height   Forward Step Up Left;2 sets;Step Height: 6";15 reps;Hand Hold: 2   Wall Squat 2 sets;20 reps   Rocker Board 2 minutes   Rocker Board Limitations [facility] and [facility]    Other Standing Knee Exercises standing hip ABD 1x10 LE against mat and cueing to reduce lateral bending   Other Standing Knee Exercises  hurdles 6 in with SPC 2RT; side step 2RT no AD   Knee/Hip Exercises: Seated   Sit to Sand 10 reps;without UE support  cueing for equal weight shift   Knee/Hip Exercises: Supine   Other Supine Knee/Hip Exercises supine hip ABD 2x10                  PT Short Term Goals - 03/20/16 0998    PT SHORT TERM GOAL #1   Title Patient will demonstrate improved gait mechanics, including proper heel-toe pattern, improved posture, and improved gait speed with no assistive device in order to demonstrate improved mobility    Baseline 7/3- improving but continues to need work    Time 3   Period Weeks   Status On-going   PT SHORT TERM GOAL #2   Title Patient will be able to perform TUG in 10 seconds or less with LRAD in order to demonstrate improved mobility and balance    Baseline 7/3- 15 with cane    Time 3   Period Weeks   Status On-going   PT SHORT TERM GOAL #3   Title Patient to be independent in techniques such as scar massage and correct application of ice in order to enhance self efficacy in managing condition    Time 3   Period Weeks   Status Achieved   PT SHORT TERM GOAL #4   Title Patient to be indepndent in correctly and consistently performing advanced HEP, to be updated PRN    Baseline 7/3- reports they are going well, she has been packing to move but states she has still been keeping up with them some days    Time 3   Period Weeks   Status Partially Met           PT Long Term Goals - 03/20/16 0841    PT LONG TERM GOAL #1   Title Patient to demonstrate strength 4+/5 in all tested muscle groups in order to assist in improving mobility    Time 6   Period Weeks   Status On-going   PT LONG TERM GOAL #2   Title Patient to be able to ambulate at least 1237f during 6MWT with no device in order to demonstrate improved mobilty and to faclitate navigation of community    Baseline 7/3- 452 during 3MWT    Time 6   Period Weeks   Status Deferred   PT LONG TERM GOAL #3    Title Patient to be particpatory in regular exercise program, at least 30 minutes in duration, at least 3 days per week, in order to assist in improving overall health status and to maintain functional gains    Baseline 7/3- has not attempted    Time 6   Period Weeks   Status On-going  Plan - 03/24/16 0916    Clinical Impression Statement Session focus on improving functional strengthening especially with hip musculature to improve gait  mechanics.  Pt continues to demonstrate significant muscle weakness with tremdelemberg gait mechanics.  Therapist faciilitation to improve weight distribution and reduce lateral sway witih gait.  Added lateral step up for quad strengthening and sidestepping for glut med strengthening.  No reports of increased    Rehab Potential Good   PT Frequency --  2-3x/week   PT Duration 3 weeks   PT Treatment/Interventions ADLs/Self Care Home Management;Cryotherapy;Moist Heat;DME Instruction;Gait training;Stair training;Functional mobility training;Therapeutic activities;Therapeutic exercise;Balance training;Neuromuscular re-education;Patient/family education;Manual techniques;Scar mobilization;Energy conservation;Taping   PT Next Visit Plan Now able to do active hip ABD. scar mobility; gait training - emphasize increased hip flexion and decreased lateral sway; work on sit to stand with equal weight shift, she has significant shift to the Rt; functional strengthening within precautions; balance training as needed, standing marches within precautions      Patient will benefit from skilled therapeutic intervention in order to improve the following deficits and impairments:  Abnormal gait, Decreased scar mobility, Decreased activity tolerance, Decreased strength, Pain, Decreased balance, Decreased mobility, Difficulty walking, Improper body mechanics, Decreased coordination, Decreased safety awareness, Postural dysfunction  Visit Diagnosis: Pain in left  hip  Muscle weakness (generalized)  Difficulty in walking, not elsewhere classified  Unsteadiness on feet     Problem List Patient Active Problem List   Diagnosis Date Noted  . Essential hypertension 02/10/2016  . Former smoker 02/01/2016  . Anemia, unspecified 02/01/2016  . Osteoarthritis of left hip   . GERD (gastroesophageal reflux disease) 04/02/2013  . Chronic bronchitis with acute exacerbation ([place]) 04/02/2013  . History of ovarian cancer 04/02/2013  . Spinal stenosis, lumbar region, with neurogenic claudication 02/27/2013  . Iliotibial band tendonitis 02/27/2013  . DEGENERATIVE DISC DISEASE, LUMBOSACRAL SPINE 12/09/2009  . H N P-LUMBAR 09/29/2009  . KNEE, ARTHRITIS, DEGEN./OSTEO 09/23/2008  . SCIATICA 05/14/2008  . Insulin dependent diabetes mellitus [facility]) 05/12/2008   Ihor Austin, [place]; [place]  Aldona Lento 03/24/2016, 12:26 PM  [place] [address] [place], [place], [address] Phone: [phone]   Fax:  [phone]  Name: [name] MRN: [id number] Date of Birth: 08-02-1951

## 2016-03-27 ENCOUNTER — Ambulatory Visit (HOSPITAL_COMMUNITY): Payer: Medicare Other | Admitting: Physical Therapy

## 2016-03-27 DIAGNOSIS — R262 Difficulty in walking, not elsewhere classified: Secondary | ICD-10-CM

## 2016-03-27 DIAGNOSIS — M25552 Pain in left hip: Secondary | ICD-10-CM

## 2016-03-27 DIAGNOSIS — R2681 Unsteadiness on feet: Secondary | ICD-10-CM

## 2016-03-27 DIAGNOSIS — M6281 Muscle weakness (generalized): Secondary | ICD-10-CM | POA: Diagnosis not present

## 2016-03-27 NOTE — Therapy (Signed)
[place], [place], [address] Phone: [phone]   Fax:  [phone]  Physical Therapy Treatment  Patient Details  Name: [name] MRN: [id number] Date of Birth: [birth year] Referring Provider: Arther Abbott   Encounter Date: 03/27/2016      PT End of Session - 03/27/16 0858    Visit Number 9   Number of Visits 18   Date for PT Re-Evaluation 04/10/16   Authorization Type [employer] (G-code done 7th session)   Authorization Time Period 02/28/16 to 04/10/16   Authorization - Visit Number 9   Authorization - Number of Visits 17   PT Start Time 3664   PT Stop Time 0857   PT Time Calculation (min) 40 min   Activity Tolerance Patient tolerated treatment well   Behavior During Therapy [facility] for tasks assessed/performed      Past Medical History  Diagnosis Date  . Diabetes mellitus     Insulin-dependent diabetes  . Hypertension   . Cancer ([place])     ovarian  . COPD (chronic obstructive pulmonary disease) ([place])   . Degenerative joint disease   . Carpal tunnel syndrome     Nocturnal and positional, bilateral    Past Surgical History  Procedure Laterality Date  . Replacement total knee      bilateral  . Cholecystectomy    . Colonoscopy  2007    Dr. Gala Romney: internal hemorrhoids, few scattered sigmoid diverticula  . Esophagogastroduodenoscopy  2008    Dr. Gala Romney: erosive esophagitis, patulous EG junction  . Colonoscopy N/A 04/09/2014    QIH:[id number] diverticulosis. Redundant colon. Single colonic polyp-removed as described above.  . Esophagogastroduodenoscopy N/A 04/09/2014    GLO:VFIEPPI reflux esophagitis. Schatzki's ring-status post dilation as described above. Hiatal hernia. Gastricerosions-status post gastric biopsy  . Savory dilation N/A 04/09/2014    Procedure: SAVORY DILATION;  Surgeon: Daneil Dolin, MD;  [facility];  Service: Endoscopy;  Laterality: N/A;  Venia Minks dilation N/A  04/09/2014    Procedure: Venia Minks DILATION;  Surgeon: Daneil Dolin, MD;  Location: [facility];  Service: Endoscopy;  Laterality: N/A;  . Abdominal hysterectomy      BSO  . Abdominal surgery      for bleeding after hysterectomy  . Left hip replacement  12/08/2015  . Total hip arthroplasty Left 12/08/2015    Procedure: TOTAL HIP ARTHROPLASTY;  Surgeon: Carole Civil, MD;  Location: [facility];  Service: Orthopedics;  Laterality: Left;  . Hip closed reduction Left 01/15/2016    Procedure: CLOSED REDUCTION LEFT HIP PROSTHESIS;  Surgeon: Carole Civil, MD;  Location: [facility] OR[facility];  Service: Orthopedics;  Laterality: Left;  . Total hip revision Left 01/26/2016    Procedure: LEFT TOTAL HIP REVISION;  Surgeon: Carole Civil, MD;  Location: [facility];  Service: Orthopedics;  Laterality: Left;  . Toenail excision      R great toenail; artificial nail    There were no vitals filed for this visit.      Subjective Assessment - 03/27/16 0819    Subjective Patient arrives stating she is "hanging in there", had to work all weekend. No pain today.    Currently in Pain? No/denies                         [facility] Adult PT Treatment/Exercise - 03/27/16 0001    Ambulation/Gait   Gait Comments gait training with no device, cues for steadiness  and heel-toe pattern, sequencing    Knee/Hip Exercises: Standing   Heel Raises 20 reps;Both   Heel Raises Limitations heel and toe   Forward Lunges Left;1 set;15 reps   Forward Lunges Limitations 4 inch box    Lateral Step Up Both;1 set;15 reps   Lateral Step Up Limitations 4 inch box    Forward Step Up Both;1 set;15 reps   Forward Step Up Limitations 6 inch box    Functional Squat 1 set;10 reps   Functional Squat Limitations at edge of mat table, eccentric lower   high table, POOR form even with mod cues    Rocker Board 2 minutes   Rocker Board Limitations [facility] and [facility]    Other Standing Knee Exercises standing hip ABD with towel,  mod(A) for correct form 1x15   Knee/Hip Exercises: Supine   Bridges Limitations 2x20   Other Supine Knee/Hip Exercises supine hip ABD 2x10             PT Education - 03/27/16 0858    Education provided Yes   Education Details schedule more appts please    Person(s) Educated Patient   Methods Explanation   Comprehension Verbalized understanding          PT Short Term Goals - 03/20/16 0838    PT SHORT TERM GOAL #1   Title Patient will demonstrate improved gait mechanics, including proper heel-toe pattern, improved posture, and improved gait speed with no assistive device in order to demonstrate improved mobility    Baseline 7/3- improving but continues to need work    Time 3   Period Weeks   Status On-going   PT SHORT TERM GOAL #2   Title Patient will be able to perform TUG in 10 seconds or less with LRAD in order to demonstrate improved mobility and balance    Baseline 7/3- 15 with cane    Time 3   Period Weeks   Status On-going   PT SHORT TERM GOAL #3   Title Patient to be independent in techniques such as scar massage and correct application of ice in order to enhance self efficacy in managing condition    Time 3   Period Weeks   Status Achieved   PT SHORT TERM GOAL #4   Title Patient to be indepndent in correctly and consistently performing advanced HEP, to be updated PRN    Baseline 7/3- reports they are going well, she has been packing to move but states she has still been keeping up with them some days    Time 3   Period Weeks   Status Partially Met           PT Long Term Goals - 03/20/16 0841    PT LONG TERM GOAL #1   Title Patient to demonstrate strength 4+/5 in all tested muscle groups in order to assist in improving mobility    Time 6   Period Weeks   Status On-going   PT LONG TERM GOAL #2   Title Patient to be able to ambulate at least 1251f during 6MWT with no device in order to demonstrate improved mobilty and to faclitate navigation of  community    Baseline 7/3- 452 during 3MWT    Time 6   Period Weeks   Status Deferred   PT LONG TERM GOAL #3   Title Patient to be particpatory in regular exercise program, at least 30 minutes in duration, at least 3 days per week, in order to assist  in improving overall health status and to maintain functional gains    Baseline 7/3- has not attempted    Time 6   Period Weeks   Status On-going               Plan - 03/27/16 0846    Clinical Impression Statement Continued with functional exercises in CKC and OKC positions today, intermittent cues for form especially with new exercises today. She continues to demonstrate significant limitation in hip ABD strength today, which has been a chronic problem possibly related to surgical approach. Encouraged patient to schedule more appointments with skilled PT services to continue addressing impairments today as well. Poor squat form today likely due to gross deconditioning and poor coordination.    Rehab Potential Good   PT Frequency Other (comment)   PT Duration 3 weeks   PT Treatment/Interventions ADLs/Self Care Home Management;Cryotherapy;Moist Heat;DME Instruction;Gait training;Stair training;Functional mobility training;Therapeutic activities;Therapeutic exercise;Balance training;Neuromuscular re-education;Patient/family education;Manual techniques;Scar mobilization;Energy conservation;Taping   PT Next Visit Plan Now able to do active hip ABD. scar mobility; gait training - emphasize increased hip flexion and decreased lateral sway; work on sit to stand with equal weight shift, she has significant shift to the Rt; functional strengthening within precautions; balance training as needed, standing marches within precautions   PT Home Exercise Plan no updates, but need to encourage full adherence   Consulted and Agree with Plan of Care Patient      Patient will benefit from skilled therapeutic intervention in order to improve the following  deficits and impairments:  Abnormal gait, Decreased scar mobility, Decreased activity tolerance, Decreased strength, Pain, Decreased balance, Decreased mobility, Difficulty walking, Improper body mechanics, Decreased coordination, Decreased safety awareness, Postural dysfunction  Visit Diagnosis: Pain in left hip  Muscle weakness (generalized)  Difficulty in walking, not elsewhere classified  Unsteadiness on feet     Problem List Patient Active Problem List   Diagnosis Date Noted  . Essential hypertension 02/10/2016  . Former smoker 02/01/2016  . Anemia, unspecified 02/01/2016  . Osteoarthritis of left hip   . GERD (gastroesophageal reflux disease) 04/02/2013  . Chronic bronchitis with acute exacerbation ([place]) 04/02/2013  . History of ovarian cancer 04/02/2013  . Spinal stenosis, lumbar region, with neurogenic claudication 02/27/2013  . Iliotibial band tendonitis 02/27/2013  . DEGENERATIVE DISC DISEASE, LUMBOSACRAL SPINE 12/09/2009  . H N P-LUMBAR 09/29/2009  . KNEE, ARTHRITIS, DEGEN./OSTEO 09/23/2008  . SCIATICA 05/14/2008  . Insulin dependent diabetes mellitus ([place]) 05/12/2008    Deniece Ree PT, DPT [phone]  [facility] [address] [place], [place], [address] Phone: [phone]   Fax:  [phone]  Name: [name] MRN: [id number] Date of Birth: 12-Sep-1951

## 2016-03-29 ENCOUNTER — Telehealth: Payer: Self-pay | Admitting: *Deleted

## 2016-03-29 ENCOUNTER — Encounter (HOSPITAL_COMMUNITY): Payer: Self-pay | Admitting: Physical Therapy

## 2016-03-29 NOTE — Telephone Encounter (Signed)
Patient called in and states that she fell 03/27/16 and she is s/p left total hip revision on 01/26/16 by Dr. Aline Brochure. She denies going to ED. She states she fell on her right side, however she felt a jar. I advised that she needed to come in and be checked out. She was given an appointment for 03/30/16 to see Dr. Luna Glasgow since Dr. Aline Brochure is out of the office.

## 2016-03-30 ENCOUNTER — Ambulatory Visit (INDEPENDENT_AMBULATORY_CARE_PROVIDER_SITE_OTHER): Payer: Medicare Other | Admitting: Orthopaedic Surgery

## 2016-03-30 ENCOUNTER — Encounter: Payer: Self-pay | Admitting: Orthopaedic Surgery

## 2016-03-30 ENCOUNTER — Ambulatory Visit (INDEPENDENT_AMBULATORY_CARE_PROVIDER_SITE_OTHER): Payer: Medicare Other

## 2016-03-30 VITALS — BP 146/71 | HR 68 | Temp 97.2°F | Ht 65.0 in | Wt 226.0 lb

## 2016-03-30 DIAGNOSIS — Z9660 Presence of unspecified orthopedic joint implant: Secondary | ICD-10-CM | POA: Diagnosis not present

## 2016-03-30 DIAGNOSIS — I10 Essential (primary) hypertension: Secondary | ICD-10-CM | POA: Diagnosis not present

## 2016-03-30 DIAGNOSIS — M25551 Pain in right hip: Secondary | ICD-10-CM

## 2016-03-30 DIAGNOSIS — M25552 Pain in left hip: Secondary | ICD-10-CM | POA: Diagnosis not present

## 2016-03-30 DIAGNOSIS — Z96649 Presence of unspecified artificial hip joint: Secondary | ICD-10-CM

## 2016-03-30 MED ORDER — HYDROCODONE-ACETAMINOPHEN 5-325 MG PO TABS: 1.0000 | ORAL_TABLET | Freq: Three times a day (TID) | ORAL | Status: DC | PRN

## 2016-03-30 NOTE — Progress Notes (Signed)
Patient SN:[id number] [name], female DOB:1951-07-18, 65 y.o. [id number]  Chief Complaint  Patient presents with  . Fall    PATIENT REPORTS FALL 03/27/16, HISTORY OF LEFT TOTAL HIP SURGERY 01/26/16    HPI  [name] is a 65 y.o. female who fell out of her patio door several days ago and hurt her right shoulder and right hip.  She did not hurt her left hip but she is very concerned about it having had a total hip.  She has bilateral total knees as well.  She is better.  She has no pain now but tenderness of the right lateral hip.  She had no other injury. She used ice and rest.  She has a cane.  HPI  Body mass index is 37.61 kg/(m^2).  ROS  Review of Systems  HENT: Negative for congestion.   Respiratory: Negative for cough and shortness of breath.   Endocrine: Positive for cold intolerance.  Musculoskeletal: Positive for arthralgias and gait problem.  Allergic/Immunologic: Positive for environmental allergies.    Past Medical History  Diagnosis Date  . Diabetes mellitus     Insulin-dependent diabetes  . Hypertension   . Cancer ([place])     ovarian  . COPD (chronic obstructive pulmonary disease) ([place])   . Degenerative joint disease   . Carpal tunnel syndrome     Nocturnal and positional, bilateral    Past Surgical History  Procedure Laterality Date  . Replacement total knee      bilateral  . Cholecystectomy    . Colonoscopy  2007    Dr. Gala Romney: internal hemorrhoids, few scattered sigmoid diverticula  . Esophagogastroduodenoscopy  2008    Dr. Gala Romney: erosive esophagitis, patulous EG junction  . Colonoscopy N/A 04/09/2014    [id number] diverticulosis. Redundant colon. Single colonic polyp-removed as described above.  . Esophagogastroduodenoscopy N/A 04/09/2014    [id number] reflux esophagitis. Schatzki's ring-status post dilation as described above. Hiatal hernia. Gastricerosions-status post gastric biopsy  . Savory dilation N/A 04/09/2014    Procedure: SAVORY  DILATION;  Surgeon: Daneil Dolin, MD;  [facility];  Service: Endoscopy;  Laterality: N/A;  Venia Minks dilation N/A 04/09/2014    Procedure: Venia Minks DILATION;  Surgeon: Daneil Dolin, MD;  [facility];  Service: Endoscopy;  Laterality: N/A;  . Abdominal hysterectomy      BSO  . Abdominal surgery      for bleeding after hysterectomy  . Left hip replacement  12/08/2015  . Total hip arthroplasty Left 12/08/2015    Procedure: TOTAL HIP ARTHROPLASTY;  Surgeon: Carole Civil, MD;  Location: [facility];  Service: Orthopedics;  Laterality: Left;  . Hip closed reduction Left 01/15/2016    Procedure: CLOSED REDUCTION LEFT HIP PROSTHESIS;  Surgeon: Carole Civil, MD;  Location: [facility];  Service: Orthopedics;  Laterality: Left;  . Total hip revision Left 01/26/2016    Procedure: LEFT TOTAL HIP REVISION;  Surgeon: Carole Civil, MD;  Location: [facility];  Service: Orthopedics;  Laterality: Left;  . Toenail excision      R great toenail; artificial nail    Family History  Problem Relation Age of Onset  . Colon cancer Neg Hx   . Heart disease Neg Hx   . Stroke Mother   . Diabetes Mother   . Diabetes Father   . Cancer Brother     Leukemia and bone cancer    Social History Social History  Substance Use Topics  . Smoking status: Former Smoker --  1.00 packs/day for 54 years    Types: Cigarettes    Quit date: 01/17/2016  . Smokeless tobacco: Never Used     Comment: Smoked [phone] up to 1 pack per day; she did stop  9 months during pregnancy  . Alcohol Use: No    Allergies  Allergen Reactions  . Gabapentin Other (See Comments)    suicidal thoughts    Current Outpatient Prescriptions  Medication Sig Dispense Refill  . albuterol (ACCUNEB) 1.25 MG/3ML nebulizer solution Take 1 ampule by nebulization every 6 (six) hours as needed for wheezing or shortness of breath.    Marland Kitchen albuterol (PROVENTIL HFA;VENTOLIN HFA) 108 (90 BASE) MCG/ACT inhaler Inhale 2 puffs into  the lungs every 4 (four) hours as needed for wheezing or shortness of breath. 1 Inhaler 1  . aspirin EC 325 MG tablet Take 325 mg by mouth 2 (two) times daily.    Marland Kitchen atenolol (TENORMIN) 50 MG tablet Take 50 mg by mouth daily.    . calcium-vitamin D (OSCAL WITH D) 500-200 MG-UNIT tablet Take 1 tablet by mouth daily.    . Cholecalciferol (VITAMIN D3) 1000 units CAPS Take 1 capsule by mouth daily.     Marland Kitchen docusate sodium (COLACE) 100 MG capsule Take 100 mg by mouth 2 (two) times daily.    . ferrous sulfate 325 (65 FE) MG EC tablet Take 325 mg by mouth daily. 5:00 pm    . HYDROcodone-acetaminophen (NORCO) 5-325 MG tablet Take 1 tablet by mouth every 8 (eight) hours as needed for moderate pain. 42 tablet 0  . insulin NPH Human (HUMULIN N,NOVOLIN N) 100 UNIT/ML injection Inject 20 Units into the skin daily.     . insulin regular (NOVOLIN R,HUMULIN R) 100 units/mL injection Inject 5 Units into the skin 3 (three) times daily before meals. If Fasting Blood sugar is >150 give 3 units in addition to scheduled 10 units    . lisinopril (PRINIVIL,ZESTRIL) 10 MG tablet Take 10 mg by mouth every evening.     . magnesium hydroxide (MILK OF MAGNESIA) 400 MG/5ML suspension Take 30 mLs by mouth daily as needed for mild constipation.    . meloxicam (MOBIC) 15 MG tablet Take 1 tablet by mouth daily.    Marland Kitchen oxyCODONE-acetaminophen (PERCOCET/ROXICET) 5-325 MG tablet Take 1 tablet by mouth every 4 (four) hours as needed for moderate pain.    . polyethylene glycol (MIRALAX / GLYCOLAX) packet Take 17 g by mouth daily as needed for mild constipation.    . triamterene-hydrochlorothiazide (DYAZIDE) 37.5-25 MG capsule Take 1 capsule by mouth daily.    . verapamil (CALAN-SR) 240 MG CR tablet Take 1 tablet by mouth daily.     No current facility-administered medications for this visit.     Physical Exam  Blood pressure 146/71, pulse 68, temperature 97.2 F (36.2 C), height 5\' 5"  (1.651 m), weight 226 lb (102.513  kg).  Constitutional: overall normal hygiene, normal nutrition, well developed, normal grooming, normal body habitus. Assistive device:cane  Musculoskeletal: gait and station Limp left, muscle tone and strength are normal, no tremors or atrophy is present.  .  Neurological: coordination overall normal.  Deep tendon reflex/nerve stretch intact.  Sensation normal.  Cranial nerves II-XII intact.   Skin:   normal overall no scars, lesions, ulcers or rashes. No psoriasis.  Psychiatric: Alert and oriented x 3.  Recent memory intact, remote memory unclear.  Normal mood and affect. Well groomed.  Good eye contact.  Cardiovascular: overall no swelling, no varicosities, no edema  bilaterally, normal temperatures of the legs and arms, no clubbing, cyanosis and good capillary refill.  Lymphatic: palpation is normal.  She has tenderness of the right shoulder but full motion, normal strength and tone. NV intact.  She has no pain of the left shoulder.  The right lateral hip and buttocks is tender but she has full motion of the right hip.  NV intact.  The left hip is tender but has no pain and very good motion.  NV intact.  Leg lengths are equal.    X-rays were done of the pelvis, reported separately.   The patient has been educated about the nature of the problem(s) and counseled on treatment options.  The patient appeared to understand what I have discussed and is in agreement with it.  Encounter Diagnoses  Name Primary?  . Hip pain, bilateral Yes  . Hip joint replacement status   . Essential hypertension     PLAN Call if any problems.  Precautions discussed.  Continue current medications.   Return to clinic Keep appointment with Dr. Aline Brochure   She is to continue PT.  Call if worse in any shape or form.  Electronically Signed Sanjuana Kava, MD 7/13/201710:04 AM

## 2016-03-31 ENCOUNTER — Ambulatory Visit (HOSPITAL_COMMUNITY): Payer: Medicare Other

## 2016-03-31 DIAGNOSIS — R2681 Unsteadiness on feet: Secondary | ICD-10-CM | POA: Diagnosis not present

## 2016-03-31 DIAGNOSIS — M6281 Muscle weakness (generalized): Secondary | ICD-10-CM

## 2016-03-31 DIAGNOSIS — M25552 Pain in left hip: Secondary | ICD-10-CM | POA: Diagnosis not present

## 2016-03-31 DIAGNOSIS — R262 Difficulty in walking, not elsewhere classified: Secondary | ICD-10-CM

## 2016-03-31 NOTE — Therapy (Signed)
[place], [place], [address] Phone: [phone]   Fax:  [phone]  Physical Therapy Treatment  Patient Details  Name: [name] MRN: [id number] Date of Birth: [birth year] Referring Provider: Arther Abbott   Encounter Date: 03/31/2016      PT End of Session - 03/31/16 0825    Visit Number 10   Number of Visits 18   Date for PT Re-Evaluation 04/10/16   Authorization Type [employer] (G-code done 7th session)   Authorization Time Period 02/28/16 to 04/10/16   Authorization - Visit Number 10   Authorization - Number of Visits 17   PT Start Time 0815   PT Stop Time 0859   PT Time Calculation (min) 44 min   Equipment Utilized During Treatment Gait belt   Activity Tolerance Patient tolerated treatment well   Behavior During Therapy [facility] for tasks assessed/performed      Past Medical History  Diagnosis Date  . Diabetes mellitus     Insulin-dependent diabetes  . Hypertension   . Cancer ([place])     ovarian  . COPD (chronic obstructive pulmonary disease) ([place])   . Degenerative joint disease   . Carpal tunnel syndrome     Nocturnal and positional, bilateral    Past Surgical History  Procedure Laterality Date  . Replacement total knee      bilateral  . Cholecystectomy    . Colonoscopy  2007    Dr. Gala Romney: internal hemorrhoids, few scattered sigmoid diverticula  . Esophagogastroduodenoscopy  2008    Dr. Gala Romney: erosive esophagitis, patulous EG junction  . Colonoscopy N/A 04/09/2014    ZES:PQZRAQT diverticulosis. Redundant colon. Single colonic polyp-removed as described above.  . Esophagogastroduodenoscopy N/A 04/09/2014    MAU:QJFHLKT reflux esophagitis. Schatzki's ring-status post dilation as described above. Hiatal hernia. Gastricerosions-status post gastric biopsy  . Savory dilation N/A 04/09/2014    Procedure: SAVORY DILATION;  Surgeon: Daneil Dolin, MD;  [facility];  Service:  Endoscopy;  Laterality: N/A;  Venia Minks dilation N/A 04/09/2014    Procedure: Venia Minks DILATION;  Surgeon: Daneil Dolin, MD;  [facility];  Service: Endoscopy;  Laterality: N/A;  . Abdominal hysterectomy      BSO  . Abdominal surgery      for bleeding after hysterectomy  . Left hip replacement  12/08/2015  . Total hip arthroplasty Left 12/08/2015    Procedure: TOTAL HIP ARTHROPLASTY;  Surgeon: Carole Civil, MD;  Location: [facility] ORS;  Service: Orthopedics;  Laterality: Left;  . Hip closed reduction Left 01/15/2016    Procedure: CLOSED REDUCTION LEFT HIP PROSTHESIS;  Surgeon: Carole Civil, MD;  Location: [facility] ORS;  Service: Orthopedics;  Laterality: Left;  . Total hip revision Left 01/26/2016    Procedure: LEFT TOTAL HIP REVISION;  Surgeon: Carole Civil, MD;  Location: [facility] ORS;  Service: Orthopedics;  Laterality: Left;  . Toenail excision      R great toenail; artificial nail    There were no vitals filed for this visit.      Subjective Assessment - 03/31/16 0810    Subjective Pt stated she is feeling rough today, reports a fall off the porch down to Rt side with pain on Rt shoulder and Bil hips pain scale 6-7/10.  Pt called MD and Xrays taken yesterday and pt reports everything looked okay   Pertinent History beta-blockers, DM, COPD, history of CA, B TKR, L hip replacement    Patient Stated  Goals be able to walk without cane   Currently in Pain? Yes   Pain Score 7    Pain Location Hip   Pain Orientation Left;Right   Pain Descriptors / Indicators Sore   Pain Radiating Towards none   Pain Onset 1 to 4 weeks ago   Pain Frequency Constant   Aggravating Factors  none   Pain Relieving Factors none   Effect of Pain on Daily Activities cannot get back to PLOF                         [facility] Adult PT Treatment/Exercise - 03/31/16 0001    Knee/Hip Exercises: Standing   Forward Lunges Left;15 reps   Forward Lunges Limitations 6in step   Other  Standing Knee Exercises standing hip ABD with towel, mod(A) for correct form 1x15   Knee/Hip Exercises: Supine   Bridges Limitations 2x20   Other Supine Knee/Hip Exercises supine hip ABD 2x15             Balance Exercises - 03/31/16 0903    Balance Exercises: Standing   Standing Eyes Opened Narrow base of support (BOS);Foam/compliant surface;2 reps;30 secs  NBOS on foam static then head turns   Standing Eyes Closed Narrow base of support (BOS);Foam/compliant surface;5 reps;10 secs  on airex   Tandem Stance Eyes open;30 secs;2 reps;Foam/compliant surface  solid surface then on airex   Sidestepping 2 reps   Marching Limitations 10x toe tapping onto 6in step             PT Short Term Goals - 03/20/16 2878    PT SHORT TERM GOAL #1   Title Patient will demonstrate improved gait mechanics, including proper heel-toe pattern, improved posture, and improved gait speed with no assistive device in order to demonstrate improved mobility    Baseline 7/3- improving but continues to need work    Time 3   Period Weeks   Status On-going   PT SHORT TERM GOAL #2   Title Patient will be able to perform TUG in 10 seconds or less with LRAD in order to demonstrate improved mobility and balance    Baseline 7/3- 15 with cane    Time 3   Period Weeks   Status On-going   PT SHORT TERM GOAL #3   Title Patient to be independent in techniques such as scar massage and correct application of ice in order to enhance self efficacy in managing condition    Time 3   Period Weeks   Status Achieved   PT SHORT TERM GOAL #4   Title Patient to be indepndent in correctly and consistently performing advanced HEP, to be updated PRN    Baseline 7/3- reports they are going well, she has been packing to move but states she has still been keeping up with them some days    Time 3   Period Weeks   Status Partially Met           PT Long Term Goals - 03/20/16 0841    PT LONG TERM GOAL #1   Title Patient to  demonstrate strength 4+/5 in all tested muscle groups in order to assist in improving mobility    Time 6   Period Weeks   Status On-going   PT LONG TERM GOAL #2   Title Patient to be able to ambulate at least 1239f during 6MWT with no device in order to demonstrate improved mobilty and to faclitate navigation of community  Baseline 7/3- 452 during 3MWT    Time 6   Period Weeks   Status Deferred   PT LONG TERM GOAL #3   Title Patient to be particpatory in regular exercise program, at least 30 minutes in duration, at least 3 days per week, in order to assist in improving overall health status and to maintain functional gains    Baseline 7/3- has not attempted    Time 6   Period Weeks   Status On-going               Plan - 03/31/16 0854    Clinical Impression Statement Pt limited by pain following fall on Monday, reports calling MD and xrays taken yesterday with reports of everything looking okay.  Session focus on functional strengthening exercises in CKC and OKC positions as well as balance training.  Pt continues to demonstrate significant limitations in Bil hip musculature especially glut meds.  Therapist facilitation for proper form and safety through session with visual, tactile and verbal cueing for proper form and technique.  Pt limited by Rt hip pain through session though no reports of increased pain.   Rehab Potential Good   PT Frequency Other (comment)  2-3x/week   PT Duration 3 weeks   PT Treatment/Interventions ADLs/Self Care Home Management;Cryotherapy;Moist Heat;DME Instruction;Gait training;Stair training;Functional mobility training;Therapeutic activities;Therapeutic exercise;Balance training;Neuromuscular re-education;Patient/family education;Manual techniques;Scar mobilization;Energy conservation;Taping   PT Next Visit Plan Now able to do active hip ABD. scar mobility; gait training - emphasize increased hip flexion and decreased lateral sway; work on sit to stand  with equal weight shift, she has significant shift to the Rt; functional strengthening within precautions; balance training as needed, standing marches within precautions      Patient will benefit from skilled therapeutic intervention in order to improve the following deficits and impairments:  Abnormal gait, Decreased scar mobility, Decreased activity tolerance, Decreased strength, Pain, Decreased balance, Decreased mobility, Difficulty walking, Improper body mechanics, Decreased coordination, Decreased safety awareness, Postural dysfunction  Visit Diagnosis: Pain in left hip  Muscle weakness (generalized)  Difficulty in walking, not elsewhere classified  Unsteadiness on feet     Problem List Patient Active Problem List   Diagnosis Date Noted  . Essential hypertension 02/10/2016  . Former smoker 02/01/2016  . Anemia, unspecified 02/01/2016  . Osteoarthritis of left hip   . GERD (gastroesophageal reflux disease) 04/02/2013  . Chronic bronchitis with acute exacerbation ([place]) 04/02/2013  . History of ovarian cancer 04/02/2013  . Spinal stenosis, lumbar region, with neurogenic claudication 02/27/2013  . Iliotibial band tendonitis 02/27/2013  . DEGENERATIVE DISC DISEASE, LUMBOSACRAL SPINE 12/09/2009  . H N P-LUMBAR 09/29/2009  . KNEE, ARTHRITIS, DEGEN./OSTEO 09/23/2008  . SCIATICA 05/14/2008  . Insulin dependent diabetes mellitus [employer]) 05/12/2008   Ihor Austin, [place]; [place]  Aldona Lento 03/31/2016, 6:51 PM  [facility] [address] [place], [place], [address] Phone: [phone]   Fax:  [phone]  Name: [name] MRN: [id number] Date of Birth: [birth year]

## 2016-04-05 ENCOUNTER — Ambulatory Visit (HOSPITAL_COMMUNITY): Payer: Medicare Other

## 2016-04-05 DIAGNOSIS — R262 Difficulty in walking, not elsewhere classified: Secondary | ICD-10-CM

## 2016-04-05 DIAGNOSIS — M25552 Pain in left hip: Secondary | ICD-10-CM

## 2016-04-05 DIAGNOSIS — R2681 Unsteadiness on feet: Secondary | ICD-10-CM

## 2016-04-05 DIAGNOSIS — M6281 Muscle weakness (generalized): Secondary | ICD-10-CM | POA: Diagnosis not present

## 2016-04-05 NOTE — Therapy (Signed)
[place], [place], [address] Phone: [phone]   Fax:  [phone]  Physical Therapy Treatment  Patient Details  Name: [name] MRN: [id number] Date of Birth: [birth year] Referring Provider: Arther Abbott  Encounter Date: 04/05/2016      PT End of Session - 04/05/16 1040    Visit Number 11   Number of Visits 18   Date for PT Re-Evaluation 04/10/16   Authorization Type [employer] (G-code done 7th session)   Authorization Time Period 02/28/16 to 04/10/16   Authorization - Visit Number 11   Authorization - Number of Visits 17   PT Start Time 0355   PT Stop Time 1118   PT Time Calculation (min) 43 min   Equipment Utilized During Treatment Gait belt   Activity Tolerance Patient tolerated treatment well   Behavior During Therapy [facility] for tasks assessed/performed      Past Medical History  Diagnosis Date  . Diabetes mellitus     Insulin-dependent diabetes  . Hypertension   . Cancer ([place])     ovarian  . COPD (chronic obstructive pulmonary disease) ([place])   . Degenerative joint disease   . Carpal tunnel syndrome     Nocturnal and positional, bilateral    Past Surgical History  Procedure Laterality Date  . Replacement total knee      bilateral  . Cholecystectomy    . Colonoscopy  2007    Dr. Gala Romney: internal hemorrhoids, few scattered sigmoid diverticula  . Esophagogastroduodenoscopy  2008    Dr. Gala Romney: erosive esophagitis, patulous EG junction  . Colonoscopy N/A 04/09/2014    HRC:BULAGTX diverticulosis. Redundant colon. Single colonic polyp-removed as described above.  . Esophagogastroduodenoscopy N/A 04/09/2014    MIW:OEHOZYY reflux esophagitis. Schatzki's ring-status post dilation as described above. Hiatal hernia. Gastricerosions-status post gastric biopsy  . Savory dilation N/A 04/09/2014    Procedure: SAVORY DILATION;  Surgeon: Daneil Dolin, MD;  [facility];  Service: Endoscopy;   Laterality: N/A;  Venia Minks dilation N/A 04/09/2014    Procedure: Venia Minks DILATION;  Surgeon: Daneil Dolin, MD;  [facility];  Service: Endoscopy;  Laterality: N/A;  . Abdominal hysterectomy      BSO  . Abdominal surgery      for bleeding after hysterectomy  . Left hip replacement  12/08/2015  . Total hip arthroplasty Left 12/08/2015    Procedure: TOTAL HIP ARTHROPLASTY;  Surgeon: Carole Civil, MD;  Location: [facility];  Service: Orthopedics;  Laterality: Left;  . Hip closed reduction Left 01/15/2016    Procedure: CLOSED REDUCTION LEFT HIP PROSTHESIS;  Surgeon: Carole Civil, MD;  Location: [facility]S;  Service: Orthopedics;  Laterality: Left;  . Total hip revision Left 01/26/2016    Procedure: LEFT TOTAL HIP REVISION;  Surgeon: Carole Civil, MD;  Location: [facility];  Service: Orthopedics;  Laterality: Left;  . Toenail excision      R great toenail; artificial nail    There were no vitals filed for this visit.      Subjective Assessment - 04/05/16 1039    Subjective Pt stated she is feeling good today, no reports of pain today.  Continues to perform some HEP exercises daily.   Pertinent History beta-blockers, DM, COPD, history of CA, B TKR, L hip replacement    Patient Stated Goals be able to walk without cane   Currently in Pain? No/denies  [facility] PT Assessment - 04/05/16 0001    Assessment   Medical Diagnosis hip joint replacement status    Referring Provider Arther Abbott   Onset Date/Surgical Date --  march 22nd then May 10th for revision   Next MD Visit [facility]rd   Precautions   Precaution Comments lateral hip precautions                [facility] Adult PT Treatment/Exercise - 04/05/16 0001    Knee/Hip Exercises: Standing   Forward Lunges Both;15 reps   Forward Lunges Limitations 6in step   Lateral Step Up Both;1 set;15 reps   Lateral Step Up Limitations 4 inch box lateral step ups and hip hiking   Forward Step Up Both;1  set;15 reps   Forward Step Up Limitations 6 inch box    Functional Squat 2 sets;10 reps   Functional Squat Limitations cueing for form   Rocker Board 2 minutes   Rocker Board Limitations [facility] and [facility]    SLS 3x 30" with 1 UE A   Other Standing Knee Exercises sidestepping   Knee/Hip Exercises: Supine   Bridges Limitations 2x20 theraband around knee   Knee/Hip Exercises: Sidelying   Hip ABduction AAROM;Left;AROM;Right;10 reps   Clams BLE 2x 10                  PT Short Term Goals - 03/20/16 4132    PT SHORT TERM GOAL #1   Title Patient will demonstrate improved gait mechanics, including proper heel-toe pattern, improved posture, and improved gait speed with no assistive device in order to demonstrate improved mobility    Baseline 7/3- improving but continues to need work    Time 3   Period Weeks   Status On-going   PT SHORT TERM GOAL #2   Title Patient will be able to perform TUG in 10 seconds or less with LRAD in order to demonstrate improved mobility and balance    Baseline 7/3- 15 with cane    Time 3   Period Weeks   Status On-going   PT SHORT TERM GOAL #3   Title Patient to be independent in techniques such as scar massage and correct application of ice in order to enhance self efficacy in managing condition    Time 3   Period Weeks   Status Achieved   PT SHORT TERM GOAL #4   Title Patient to be indepndent in correctly and consistently performing advanced HEP, to be updated PRN    Baseline 7/3- reports they are going well, she has been packing to move but states she has still been keeping up with them some days    Time 3   Period Weeks   Status Partially Met           PT Long Term Goals - 03/20/16 0841    PT LONG TERM GOAL #1   Title Patient to demonstrate strength 4+/5 in all tested muscle groups in order to assist in improving mobility    Time 6   Period Weeks   Status On-going   PT LONG TERM GOAL #2   Title Patient to be able to ambulate at  least 1264f during 6MWT with no device in order to demonstrate improved mobilty and to faclitate navigation of community    Baseline 7/3- 452 during 3MWT    Time 6   Period Weeks   Status Deferred   PT LONG TERM GOAL #3   Title Patient to be particpatory  in regular exercise program, at least 30 minutes in duration, at least 3 days per week, in order to assist in improving overall health status and to maintain functional gains    Baseline 7/3- has not attempted    Time 6   Period Weeks   Status On-going               Plan - 04/05/16 1106    Clinical Impression Statement Session focus on functional strengthening especially for proximal hip musculature.  Progressed to sidelying hip abduction with AAROM required due to glut med weakness.  Therapist facilitaiton for proper form, technqiues and safety with CKC especially with balance activities.  Pt able to tolerate all new exercises with no c/o increased pain, was limited by fatigue with activities.     Rehab Potential Good   PT Frequency --  2-3x/ week   PT Duration 3 weeks   PT Treatment/Interventions ADLs/Self Care Home Management;Cryotherapy;Moist Heat;DME Instruction;Gait training;Stair training;Functional mobility training;Therapeutic activities;Therapeutic exercise;Balance training;Neuromuscular re-education;Patient/family education;Manual techniques;Scar mobilization;Energy conservation;Taping   PT Next Visit Plan Reassess 2 more sessions, re-cert due 99/87/2158.  Now able to do active hip ABD. scar mobility; gait training - emphasize increased hip flexion and decreased lateral sway; work on sit to stand with equal weight shift, she has significant shift to the Rt; functional strengthening within precautions; balance training as needed, standing marches within precautions      Patient will benefit from skilled therapeutic intervention in order to improve the following deficits and impairments:  Abnormal gait, Decreased scar  mobility, Decreased activity tolerance, Decreased strength, Pain, Decreased balance, Decreased mobility, Difficulty walking, Improper body mechanics, Decreased coordination, Decreased safety awareness, Postural dysfunction  Visit Diagnosis: Pain in left hip  Muscle weakness (generalized)  Difficulty in walking, not elsewhere classified  Unsteadiness on feet     Problem List Patient Active Problem List   Diagnosis Date Noted  . Essential hypertension 02/10/2016  . Former smoker 02/01/2016  . Anemia, unspecified 02/01/2016  . Osteoarthritis of left hip   . GERD (gastroesophageal reflux disease) 04/02/2013  . Chronic bronchitis with acute exacerbation ([place]) 04/02/2013  . History of ovarian cancer 04/02/2013  . Spinal stenosis, lumbar region, with neurogenic claudication 02/27/2013  . Iliotibial band tendonitis 02/27/2013  . DEGENERATIVE DISC DISEASE, LUMBOSACRAL SPINE 12/09/2009  . H N P-LUMBAR 09/29/2009  . KNEE, ARTHRITIS, DEGEN./OSTEO 09/23/2008  . SCIATICA 05/14/2008  . Insulin dependent diabetes mellitus [facility]) 05/12/2008   Ihor Austin, [place]; [place]  Aldona Lento 04/05/2016, 11:50 AM  [place], [place], [address] Phone: [phone]   Fax:  [phone]  Name: [name] MRN: [id number] Date of Birth: 02-Jan-1951

## 2016-04-07 ENCOUNTER — Ambulatory Visit (HOSPITAL_COMMUNITY): Payer: Medicare Other

## 2016-04-07 DIAGNOSIS — M25552 Pain in left hip: Secondary | ICD-10-CM | POA: Diagnosis not present

## 2016-04-07 DIAGNOSIS — M6281 Muscle weakness (generalized): Secondary | ICD-10-CM | POA: Diagnosis not present

## 2016-04-07 DIAGNOSIS — R262 Difficulty in walking, not elsewhere classified: Secondary | ICD-10-CM | POA: Diagnosis not present

## 2016-04-07 DIAGNOSIS — R2681 Unsteadiness on feet: Secondary | ICD-10-CM | POA: Diagnosis not present

## 2016-04-07 NOTE — Therapy (Signed)
[facility] [address] [place], [place], [address] Phone: [phone]   Fax:  [phone]  Physical Therapy Treatment  Patient Details  Name: [name] MRN: [id number] Date of Birth: 03/31/1951 Referring Provider: Stanley Harrison  Encounter Date: 04/07/2016      PT End of Session - 04/07/16 0923    Visit Number 12   Number of Visits 18   Date for PT Re-Evaluation 04/10/16   Authorization Type [employer] (G-code done 7th session)   Authorization Time Period 02/28/16 to 04/10/16   Authorization - Visit Number 12   Authorization - Number of Visits 17   PT Start Time 0905   PT Stop Time 0943   PT Time Calculation (min) 38 min   Activity Tolerance Patient tolerated treatment well   Behavior During Therapy WFL for tasks assessed/performed      Past Medical History  Diagnosis Date  . Diabetes mellitus     Insulin-dependent diabetes  . Hypertension   . Cancer (HCC)     ovarian  . COPD (chronic obstructive pulmonary disease) (HCC)   . Degenerative joint disease   . Carpal tunnel syndrome     Nocturnal and positional, bilateral    Past Surgical History  Procedure Laterality Date  . Replacement total knee      bilateral  . Cholecystectomy    . Colonoscopy  2007    Dr. Rourk: internal hemorrhoids, few scattered sigmoid diverticula  . Esophagogastroduodenoscopy  2008    Dr. Rourk: erosive esophagitis, patulous EG junction  . Colonoscopy N/A 04/09/2014    RMR:Colonic diverticulosis. Redundant colon. Single colonic polyp-removed as described above.  . Esophagogastroduodenoscopy N/A 04/09/2014    RMR:Erosive reflux esophagitis. Schatzki's ring-status post dilation as described above. Hiatal hernia. Gastricerosions-status post gastric biopsy  . Savory dilation N/A 04/09/2014    Procedure: SAVORY DILATION;  Surgeon: Robert M Rourk, MD;  [facility];  Service: Endoscopy;  Laterality: N/A;  . Maloney dilation N/A  04/09/2014    Procedure: MALONEY DILATION;  Surgeon: Robert M Rourk, MD;  [facility];  Service: Endoscopy;  Laterality: N/A;  . Abdominal hysterectomy      BSO  . Abdominal surgery      for bleeding after hysterectomy  . Left hip replacement  12/08/2015  . Total hip arthroplasty Left 12/08/2015    Procedure: TOTAL HIP ARTHROPLASTY;  Surgeon: Stanley E Harrison, MD;  Location: [facility];  Service: Orthopedics;  Laterality: Left;  . Hip closed reduction Left 01/15/2016    Procedure: CLOSED REDUCTION LEFT HIP PROSTHESIS;  Surgeon: Stanley E Harrison, MD;  Location: [facility];  Service: Orthopedics;  Laterality: Left;  . Total hip revision Left 01/26/2016    Procedure: LEFT TOTAL HIP REVISION;  Surgeon: Stanley E Harrison, MD;  Location: [facility];  Service: Orthopedics;  Laterality: Left;  . Toenail excision      R great toenail; artificial nail    There were no vitals filed for this visit.      Subjective Assessment - 04/07/16 0908    Subjective Pt reports she is doing alright today. She reports her HEP is performed partially each day. Her pain has been stable, mostly just soem DOMS.    Pertinent History beta-blockers, DM, COPD, history of CA, B TKR, L hip replacement    Currently in Pain? No/denies  just some sorenss                           [facility] Adult PT Treatment/Exercise - 04/07/16 0001    Knee/Hip Exercises: Standing   Forward Lunges Both;15 reps   Forward Lunges Limitations 6in step   Lateral Step Up Both;1 set;15 reps   Lateral Step Up Limitations 4 inch box lateral step ups and hip hiking   Forward Step Up Both;1 set;15 reps   Forward Step Up Limitations 6 inch box    Functional Squat --   Functional Squat Limitations --   Rocker Board --   Rocker Board Limitations --   SLS --   Knee/Hip Exercises: Supine   Bridges Limitations --  *see below    Bridges with Clamshell Both;2 sets;10 reps;Strengthening  +clam isometric RedTB, feet together   Other  Supine Knee/Hip Exercises Supine Band Clams,narrow stance  2x10 BlueTB   Knee/Hip Exercises: Sidelying   Hip ABduction AAROM;Left;AROM;Right;10 reps   Clams *see above  performed supine with band                  PT Short Term Goals - 03/20/16 0838    PT SHORT TERM GOAL #1   Title Patient will demonstrate improved gait mechanics, including proper heel-toe pattern, improved posture, and improved gait speed with no assistive device in order to demonstrate improved mobility    Baseline 7/3- improving but continues to need work    Time 3   Period Weeks   Status On-going   PT SHORT TERM GOAL #2   Title Patient will be able to perform TUG in 10 seconds or less with LRAD in order to demonstrate improved mobility and balance    Baseline 7/3- 15 with cane    Time 3   Period Weeks   Status On-going   PT SHORT TERM GOAL #3   Title Patient to be independent in techniques such as scar massage and correct application of ice in order to enhance self efficacy in managing condition    Time 3   Period Weeks   Status Achieved   PT SHORT TERM GOAL #4   Title Patient to be indepndent in correctly and consistently performing advanced HEP, to be updated PRN    Baseline 7/3- reports they are going well, she has been packing to move but states she has still been keeping up with them some days    Time 3   Period Weeks   Status Partially Met           PT Long Term Goals - 03/20/16 0841    PT LONG TERM GOAL #1   Title Patient to demonstrate strength 4+/5 in all tested muscle groups in order to assist in improving mobility    Time 6   Period Weeks   Status On-going   PT LONG TERM GOAL #2   Title Patient to be able to ambulate at least 1200ft during 6MWT with no device in order to demonstrate improved mobilty and to faclitate navigation of community    Baseline 7/3- 452 during 3MWT    Time 6   Period Weeks   Status Deferred   PT LONG TERM GOAL #3   Title Patient to be particpatory in  regular exercise program, at least 30 minutes in duration, at least 3 days per week, in order to assist in improving overall health status and to maintain functional gains    Baseline 7/3- has not attempted    Time 6   Period Weeks   Status On-going                 Plan - 04/07/16 0924    Clinical Impression Statement Pt toleratign treatment session well, making progress toward all goals. Exercises repeated this session, minimal changes due to weakness continued. Upgraded band to clamshells today. Sidelying abduction requires maxA to perform in straight plane without  TFL dominance. Lunges requires heavy correciton for proper formin pelvisrotation, avoidance of valgus in knee, and upright trunk.    Rehab Potential Good   Clinical Impairments Affecting Rehab Potential Hx of two L hip dislocations, bilat TKA Hx   PT Duration 3 weeks   PT Treatment/Interventions ADLs/Self Care Home Management;Cryotherapy;Moist Heat;DME Instruction;Gait training;Stair training;Functional mobility training;Therapeutic activities;Therapeutic exercise;Balance training;Neuromuscular re-education;Patient/family education;Manual techniques;Scar mobilization;Energy conservation;Taping   PT Next Visit Plan Reassess next sessions, re-cert due 35/32/9924.  Continue tactile cues and physical assist for straight plane active hip ABD. scar mobility; work on sit to stand with equal weight shift, she has significant shift to thec R; functional strengthening within precautions;    PT Home Exercise Plan no updates, but need to encourage full adherence   Consulted and Agree with Plan of Care Patient      Patient will benefit from skilled therapeutic intervention in order to improve the following deficits and impairments:  Abnormal gait, Decreased scar mobility, Decreased activity tolerance, Decreased strength, Pain, Decreased balance, Decreased mobility, Difficulty walking, Improper body mechanics, Decreased coordination,  Decreased safety awareness, Postural dysfunction  Visit Diagnosis: Pain in left hip  Muscle weakness (generalized)  Difficulty in walking, not elsewhere classified  Unsteadiness on feet     Problem List Patient Active Problem List   Diagnosis Date Noted  . Essential hypertension 02/10/2016  . Former smoker 02/01/2016  . Anemia, unspecified 02/01/2016  . Osteoarthritis of left hip   . GERD (gastroesophageal reflux disease) 04/02/2013  . Chronic bronchitis with acute exacerbation ([place]) 04/02/2013  . History of ovarian cancer 04/02/2013  . Spinal stenosis, lumbar region, with neurogenic claudication 02/27/2013  . Iliotibial band tendonitis 02/27/2013  . DEGENERATIVE DISC DISEASE, LUMBOSACRAL SPINE 12/09/2009  . H N P-LUMBAR 09/29/2009  . KNEE, ARTHRITIS, DEGEN./OSTEO 09/23/2008  . SCIATICA 05/14/2008  . Insulin dependent diabetes mellitus ([place]) 05/12/2008    9:47 AM, 04/07/2016 Etta Grandchild, PT, DPT Physical Therapist at [facility] [phone] (office)     [place], [place], [address] Phone: [phone]   Fax:  [phone]  Name: [name] MRN: [id number] Date of Birth: 1951/03/03

## 2016-04-10 ENCOUNTER — Ambulatory Visit (HOSPITAL_COMMUNITY): Payer: Medicare Other | Admitting: Physical Therapy

## 2016-04-10 DIAGNOSIS — M6281 Muscle weakness (generalized): Secondary | ICD-10-CM

## 2016-04-10 DIAGNOSIS — R2681 Unsteadiness on feet: Secondary | ICD-10-CM

## 2016-04-10 DIAGNOSIS — M25552 Pain in left hip: Secondary | ICD-10-CM

## 2016-04-10 DIAGNOSIS — R262 Difficulty in walking, not elsewhere classified: Secondary | ICD-10-CM | POA: Diagnosis not present

## 2016-04-10 NOTE — Patient Instructions (Addendum)
Strengthening: Hip Adduction (Side-Lying)    Tighten muscles on front of right thigh, then lift leg _10___ inches from surface, keeping knee locked.  Repeat 10____ times per set. Do __1__ sets per session. Do __2__ sessions per day.  http://orth.exer.us/624   CStrengthening: Straight Leg Raise (Phase 1)    Tighten muscles on front of right thigh, then lift leg _18___ inches from surface, keeping knee locked.  Repeat 10___ times per set. Do __1__ sets per session. Do _2___ sessions per day.  http://orth.exer.us/614   Copyright  VHI. All rights reserved.

## 2016-04-10 NOTE — Therapy (Signed)
[place], [place], [address] Phone: [phone]   Fax:  [phone]  Physical Therapy Treatment  Patient Details  Name: [name] MRN: [id number] Date of Birth: 1951/05/23 Referring Provider: Arther Abbott  Encounter Date: 04/10/2016      PT End of Session - 04/10/16 1158    Visit Number 13   Number of Visits 13   Date for PT Re-Evaluation 04/10/16   Authorization Type [employer] (G-code done 7th session)   Authorization Time Period 02/28/16 to 04/10/16   Authorization - Visit Number 13   Authorization - Number of Visits 13   PT Start Time 8841   PT Stop Time 1115   PT Time Calculation (min) 40 min   Activity Tolerance Patient tolerated treatment well   Behavior During Therapy [facility] for tasks assessed/performed      Past Medical History:  Diagnosis Date  . Cancer ([place])    ovarian  . Carpal tunnel syndrome    Nocturnal and positional, bilateral  . COPD (chronic obstructive pulmonary disease) ([place])   . Degenerative joint disease   . Diabetes mellitus    Insulin-dependent diabetes  . Hypertension     Past Surgical History:  Procedure Laterality Date  . ABDOMINAL HYSTERECTOMY     BSO  . ABDOMINAL SURGERY     for bleeding after hysterectomy  . CHOLECYSTECTOMY    . COLONOSCOPY  2007   Dr. Gala Romney: internal hemorrhoids, few scattered sigmoid diverticula  . COLONOSCOPY N/A 04/09/2014   YSA:YTKZSWF diverticulosis. Redundant colon. Single colonic polyp-removed as described above.  . ESOPHAGOGASTRODUODENOSCOPY  2008   Dr. Gala Romney: erosive esophagitis, patulous EG junction  . ESOPHAGOGASTRODUODENOSCOPY N/A 04/09/2014   UXN:ATFTDDU reflux esophagitis. Schatzki's ring-status post dilation as described above. Hiatal hernia. Gastricerosions-status post gastric biopsy  . HIP CLOSED REDUCTION Left 01/15/2016   Procedure: CLOSED REDUCTION LEFT HIP PROSTHESIS;  Surgeon: Carole Civil, MD;  Location: [facility];   Service: Orthopedics;  Laterality: Left;  . left hip replacement  12/08/2015  . MALONEY DILATION N/A 04/09/2014   Procedure: Venia Minks DILATION;  Surgeon: Daneil Dolin, MD;  [facility];  Service: Endoscopy;  Laterality: N/A;  . REPLACEMENT TOTAL KNEE     bilateral  . SAVORY DILATION N/A 04/09/2014   Procedure: SAVORY DILATION;  Surgeon: Daneil Dolin, MD;  [facility];  Service: Endoscopy;  Laterality: N/A;  . TOENAIL EXCISION     R great toenail; artificial nail  . TOTAL HIP ARTHROPLASTY Left 12/08/2015   Procedure: TOTAL HIP ARTHROPLASTY;  Surgeon: Carole Civil, MD;  Location: [facility];  Service: Orthopedics;  Laterality: Left;  . TOTAL HIP REVISION Left 01/26/2016   Procedure: LEFT TOTAL HIP REVISION;  Surgeon: Carole Civil, MD;  Location: [facility];  Service: Orthopedics;  Laterality: Left;    There were no vitals filed for this visit.      Subjective Assessment - 04/10/16 1038    Subjective Patient reports that at home she mainly walks without her cane.  She states that she is able to complete most everything that she wants to it's just slower.     Pertinent History beta-blockers, DM, COPD, history of CA, B TKR, L hip replacement    How long can you sit comfortably? 7/3- unlimited    How long can you stand comfortably? 7/3- 5-10 minutes  today: 30 minutes.    How long can you walk comfortably? 7/3- unsure, but better: today  pt is walking with her cane in [employer] in the grocery store.  She does not walk for exercise    Patient Stated Goals be able to walk without cane;  today 7/24: pt will walk without her cane in her home.    Currently in Pain? No/denies            [facility] PT Assessment - 04/10/16 0001      Assessment   Medical Diagnosis hip joint replacement status    Onset Date/Surgical Date --  March 22nd, then May10th   Next MD Visit [facility] July 1st      Precautions   Precaution Comments lateral hip precautions      Prior Function    Level of Independence Independent;Independent with basic ADLs;Independent with gait;Independent with transfers   Vocation Retired   Leisure no hobbies      Observation/Other Assessments   Focus on Therapeutic Outcomes ([place])  58 limited      Strength   Right Hip Flexion 4-/5  was 4-/5   Right Hip Extension 4+/5   Right Hip ABduction 4/5  was 3/5    Left Hip Flexion 4/5  was 4-/5    Left Hip Extension 4/5   Left Hip ABduction 2/5  sidelying was 2/5   Right Knee Flexion 5/5  was 4/5   Right Knee Extension 5/5  was 4+/5    Left Knee Flexion 5/5  was 4/5   Left Knee Extension 5/5  was 4+/5    Right Ankle Dorsiflexion 5/5   Left Ankle Dorsiflexion 5/5     6 minute walk test results    Aerobic Endurance Distance Walked 576  was 452    Endurance additional comments 3MWT, SPC no rest      Timed Up and Go Test   Normal TUG (seconds) 12.21  was 15.45    TUG Comments SPC                      [facility] Adult PT Treatment/Exercise - 04/10/16 0001      Ambulation/Gait   Gait velocity 576' in 3 minutes      Knee/Hip Exercises: Supine   Straight Leg Raises Strengthening;Both;10 reps     Knee/Hip Exercises: Sidelying   Hip ABduction Strengthening;Both;10 reps     Knee/Hip Exercises: Prone   Hip Extension Strengthening;Left;10 reps                PT Education - 04/10/16 1157    Education provided Yes   Education Details HEP for strengthening of Hip Abductor, flexors.  Discussed that the reason she had a limp was due to hip abductor weakness; discussed the need to begin a walking program especally since pt has COPD    Person(s) Educated Patient   Methods Explanation;Handout;Demonstration   Comprehension Verbalized understanding;Returned demonstration          PT Short Term Goals - 04/10/16 1059      PT SHORT TERM GOAL #1   Title Patient will demonstrate improved gait mechanics, including proper heel-toe pattern, improved posture, and improved gait  speed with no assistive device in order to demonstrate improved mobility    Baseline 7/3- improving but continues to need work    Time 3   Period Weeks   Status Achieved     PT SHORT TERM GOAL #2   Title Patient will be able to perform TUG in 10 seconds or less with LRAD in order to demonstrate improved mobility and  balance    Baseline 7/3- 15 with cane ; today 18-Apr-2023 12.21   Time 3   Period Weeks   Status On-going     PT SHORT TERM GOAL #3   Title Patient to be independent in techniques such as scar massage and correct application of ice in order to enhance self efficacy in managing condition    Time 3   Period Weeks   Status Achieved     PT SHORT TERM GOAL #4   Title Patient to be indepndent in correctly and consistently performing advanced HEP, to be updated PRN    Baseline 7/3- reports they are going well, she has been packing to move but states she has still been keeping up with them some days    Time 3   Period Weeks   Status Partially Met  PT admits to not completing her HEP            PT Long Term Goals - 04/17/2016 0001      PT LONG TERM GOAL #1   Title Patient to demonstrate strength 4+/5 in all tested muscle groups in order to assist in improving mobility    Time 6   Period Weeks   Status On-going     PT LONG TERM GOAL #2   Title Patient to be able to ambulate at least 1285f during 6MWT with no device in order to demonstrate improved mobilty and to faclitate navigation of community    Baseline [id number] minutes 576' pt has COPD    Time 6   Period Weeks   Status Not Met     PT LONG TERM GOAL #3   Title Patient to be particpatory in regular exercise program, at least 30 minutes in duration, at least 3 days per week, in order to assist in improving overall health status and to maintain functional gains    Time 6   Period Weeks   Status Not Met               Plan - 0July 31, 20171159    Clinical Impression Statement Pt re-evaluated this treatment.  Pt  has made significant functional gains stating that she is able to do everything that she wants.  She still has objective limitations which were discussed with patient.  The patient verbalizes that she feels she is ready for discharge, she realizes that she still has some limitations but feels that she is at a point where she can work on these at home on her own at this time.     Rehab Potential Good   Clinical Impairments Affecting Rehab Potential Hx of two L hip dislocations, bilat TKA Hx   PT Duration 3 weeks   PT Treatment/Interventions ADLs/Self Care Home Management;Cryotherapy;Moist Heat;DME Instruction;Gait training;Stair training;Functional mobility training;Therapeutic activities;Therapeutic exercise;Balance training;Neuromuscular re-education;Patient/family education;Manual techniques;Scar mobilization;Energy conservation;Taping   PT Next Visit Plan Discharge to home program   PT Home Exercise Plan SLR and sidelying abduction    Consulted and Agree with Plan of Care Patient      Patient will benefit from skilled therapeutic intervention in order to improve the following deficits and impairments:  Abnormal gait, Decreased scar mobility, Decreased activity tolerance, Decreased strength, Pain, Decreased balance, Decreased mobility, Difficulty walking, Improper body mechanics, Decreased coordination, Decreased safety awareness, Postural dysfunction  Visit Diagnosis: Pain in left hip  Muscle weakness (generalized)  Difficulty in walking, not elsewhere classified  Unsteadiness on feet       G-Codes - 0Jul 31, 20171203  Functional Limitation Mobility: Walking and moving around   Mobility: Walking and Moving Around Goal Status [phone]) At least 40 percent but less than 60 percent impaired, limited or restricted   Mobility: Walking and Moving Around Discharge Status [phone]) At least 40 percent but less than 60 percent impaired, limited or restricted      Problem List Patient Active  Problem List   Diagnosis Date Noted  . Essential hypertension 02/10/2016  . Former smoker 02/01/2016  . Anemia, unspecified 02/01/2016  . Osteoarthritis of left hip   . GERD (gastroesophageal reflux disease) 04/02/2013  . Chronic bronchitis with acute exacerbation ([place]) 04/02/2013  . History of ovarian cancer 04/02/2013  . Spinal stenosis, lumbar region, with neurogenic claudication 02/27/2013  . Iliotibial band tendonitis 02/27/2013  . DEGENERATIVE DISC DISEASE, LUMBOSACRAL SPINE 12/09/2009  . H N P-LUMBAR 09/29/2009  . KNEE, ARTHRITIS, DEGEN./OSTEO 09/23/2008  . SCIATICA 05/14/2008  . Insulin dependent diabetes mellitus [facility]) 05/12/2008    Rayetta Humphrey, PT CLT [phone] 04/10/2016, 12:14 PM  [place] [address] [place], [place], [address] Phone: [phone]   Fax:  [phone]  Name: [name] MRN: [id number] Date of Birth: Oct 19, 1950  PHYSICAL THERAPY DISCHARGE SUMMARY  Visits from Start of Care: 13  Current functional level related to goals / functional outcomes: See above   Remaining deficits: See above   Education / Equipment: See above Plan: Patient agrees to discharge.  Patient goals were partially met. Patient is being discharged due to being pleased with the current functional level.  ?????   Rayetta Humphrey, [place] CLT [phone]

## 2016-04-14 ENCOUNTER — Encounter (HOSPITAL_COMMUNITY): Payer: Self-pay | Admitting: Physical Therapy

## 2016-04-20 ENCOUNTER — Ambulatory Visit: Payer: Medicare Other | Admitting: Orthopedic Surgery

## 2016-04-20 VITALS — BP 134/71 | HR 59 | Ht 65.0 in | Wt 218.0 lb

## 2016-04-20 DIAGNOSIS — Z96649 Presence of unspecified artificial hip joint: Secondary | ICD-10-CM

## 2016-04-20 NOTE — Progress Notes (Signed)
Status post revision total hip  Acetabular revision  Patient comes in with abductor lurch  Patient finish therapy will return to the [place] upper body exercises and the bicycle  Hip flexion 120 no instability  Follow-up in one month recheck left hip to

## 2016-04-24 DIAGNOSIS — F413 Other mixed anxiety disorders: Secondary | ICD-10-CM | POA: Diagnosis not present

## 2016-05-23 ENCOUNTER — Ambulatory Visit (INDEPENDENT_AMBULATORY_CARE_PROVIDER_SITE_OTHER): Payer: Medicare Other | Admitting: Orthopedic Surgery

## 2016-05-23 ENCOUNTER — Encounter: Payer: Self-pay | Admitting: Orthopedic Surgery

## 2016-05-23 VITALS — BP 155/91 | HR 67 | Ht 65.0 in | Wt 211.0 lb

## 2016-05-23 DIAGNOSIS — Z96649 Presence of unspecified artificial hip joint: Secondary | ICD-10-CM

## 2016-05-23 DIAGNOSIS — Z9660 Presence of unspecified orthopedic joint implant: Secondary | ICD-10-CM | POA: Diagnosis not present

## 2016-05-23 NOTE — Progress Notes (Signed)
FOLLOW UP  Chief Complaint  Patient presents with  . Follow-up    left total hip revision, DOS 01/26/16   Encounter Diagnosis  Name Primary?  . S/P hip replacement Yes   REVISION LEFT ACETABULUM  She's doing very well no complaints. She is now exercising at the [facility]. Her lower back is bothering her slightly depends on her activity level but she's not having any hip or groin pain and no catching or locking no symptoms of subluxation dislocation  Review of Systems  Constitutional: Negative for chills, fever and weight loss.  Respiratory: Negative for shortness of breath.   Cardiovascular: Negative for chest pain.  Neurological: Negative for tingling.   BP (!) 155/91   Pulse 67   Ht 5\' 5"  (1.651 m)   Wt 211 lb (95.7 kg)   BMI 35.11 kg/m  Physical Exam  Constitutional: She is oriented to person, place, and time. She appears well-developed and well-nourished. No distress.  Musculoskeletal:  Left and right legs are equal. Hip flexion 120 no pain. Hip is stable to push pull test and subluxation test  Hip flexion strength is normal  Hip abduction  shows mild weakness  Abductor lurch has improved she has no assistive devices and no limping  Neurological: She is alert and oriented to person, place, and time.  Skin: Skin is warm and dry. She is not diaphoretic.  Psychiatric: She has a normal mood and affect. Her behavior is normal.    Encounter Diagnosis  Name Primary?  . S/P hip replacement Yes    Status post revision left total hip replacement acetabular component for instability  Doing well follow-up 3 months

## 2016-06-26 DIAGNOSIS — Z0000 Encounter for general adult medical examination without abnormal findings: Secondary | ICD-10-CM | POA: Diagnosis not present

## 2016-06-26 DIAGNOSIS — Z23 Encounter for immunization: Secondary | ICD-10-CM | POA: Diagnosis not present

## 2016-06-26 DIAGNOSIS — E118 Type 2 diabetes mellitus with unspecified complications: Secondary | ICD-10-CM | POA: Diagnosis not present

## 2016-06-26 DIAGNOSIS — I10 Essential (primary) hypertension: Secondary | ICD-10-CM | POA: Diagnosis not present

## 2016-06-26 DIAGNOSIS — J449 Chronic obstructive pulmonary disease, unspecified: Secondary | ICD-10-CM | POA: Diagnosis not present

## 2016-07-19 ENCOUNTER — Other Ambulatory Visit (HOSPITAL_COMMUNITY): Payer: Self-pay | Admitting: Family Medicine

## 2016-07-19 DIAGNOSIS — Z1231 Encounter for screening mammogram for malignant neoplasm of breast: Secondary | ICD-10-CM

## 2016-08-16 ENCOUNTER — Ambulatory Visit (HOSPITAL_COMMUNITY): Payer: Self-pay

## 2016-08-22 ENCOUNTER — Ambulatory Visit (INDEPENDENT_AMBULATORY_CARE_PROVIDER_SITE_OTHER): Payer: Self-pay | Admitting: Orthopedic Surgery

## 2016-08-22 ENCOUNTER — Encounter: Payer: Self-pay | Admitting: Orthopedic Surgery

## 2016-08-22 DIAGNOSIS — S76012A Strain of muscle, fascia and tendon of left hip, initial encounter: Secondary | ICD-10-CM

## 2016-08-22 DIAGNOSIS — Z9889 Other specified postprocedural states: Secondary | ICD-10-CM

## 2016-08-22 MED ORDER — HYDROCODONE-ACETAMINOPHEN 5-325 MG PO TABS: 1.0000 | ORAL_TABLET | Freq: Three times a day (TID) | ORAL | 0 refills | Status: DC | PRN

## 2016-08-22 NOTE — Patient Instructions (Signed)
Apply heat 20 min 3 x a day   No biking x 2 weeks   Use cane x 2 weeks   Diagnosis: Hip flexor strain

## 2016-08-22 NOTE — Progress Notes (Signed)
Patient ID: [name], female   DOB: [birth year], 65 y.o.   MRN: [id number]  Chief Complaint  Patient presents with  . Follow-up    FOLLOW UP LEFT HIP REVISION, DOS 01/26/16    HPI [name] is a 65 y.o. female.   HPI  65 year old female had ReVision total hip doing well and in her usual state of health going to the [employer] got up from a supine position and felt intense pain over the left hip presents for evaluation of that.    Review of Systems Review of Systems  No catching locking giving way fever chills   Physical Exam  Physical Exam  she has a slight antalgic gait she has tenderness over the left hip flexors she has weakness in the left hip flexor with normal strength in the right hip is stable she has no neurologic deficits on the left leg normal sensation normal pulse no peripheral edema  She has normal range of motion of the hip with painful hip flexion   Hip flexor strain  Recommend heat 3 times a day for 20 minutes No biking for the next 2 weeks Resumed right hand for 2 weeks  Return 1 month

## 2016-08-23 ENCOUNTER — Ambulatory Visit (HOSPITAL_COMMUNITY): Payer: Self-pay

## 2016-08-24 ENCOUNTER — Ambulatory Visit (HOSPITAL_COMMUNITY)
Admission: RE | Admit: 2016-08-24 | Discharge: 2016-08-24 | Disposition: A | Discharge status: Home / Self Care | Payer: Medicare Other | Source: Ambulatory Visit | Attending: Family Medicine | Admitting: Family Medicine

## 2016-08-24 DIAGNOSIS — Z1231 Encounter for screening mammogram for malignant neoplasm of breast: Secondary | ICD-10-CM | POA: Insufficient documentation

## 2016-09-19 ENCOUNTER — Encounter: Payer: Self-pay | Admitting: Orthopedic Surgery

## 2016-09-19 ENCOUNTER — Ambulatory Visit (INDEPENDENT_AMBULATORY_CARE_PROVIDER_SITE_OTHER): Payer: Self-pay | Admitting: Orthopedic Surgery

## 2016-09-19 DIAGNOSIS — M5126 Other intervertebral disc displacement, lumbar region: Secondary | ICD-10-CM

## 2016-09-19 MED ORDER — HYDROCODONE-ACETAMINOPHEN 5-325 MG PO TABS: 1.0000 | ORAL_TABLET | Freq: Four times a day (QID) | ORAL | 0 refills | Status: DC | PRN

## 2016-09-19 NOTE — Progress Notes (Signed)
Patient ID: [name], female   DOB: Feb 05, 1951, 66 y.o.   MRN: [id number]  Chief Complaint  Patient presents with  . Follow-up    LEFT TOTAL HIP REVISION, DOS 01/26/16    HPI AITANNA Welch is a 66 y.o. female.  66 year old female presents back for reevaluation of weakness in her left hip. She is complaining of lower back pain radiating to the left hip with weakness and numbness in the anterior portion of the left thigh status post left hip total hip revision back in May 2017  She has trouble walking after standing for a period of time and has trouble moving her left leg for after sitting for some period of time    Review of Systems Review of Systems Bowel and bladder function remain normal   Past Medical History:  Diagnosis Date  . Cancer ([place])    ovarian  . Carpal tunnel syndrome    Nocturnal and positional, bilateral  . COPD (chronic obstructive pulmonary disease) ([place])   . Degenerative joint disease   . Diabetes mellitus    Insulin-dependent diabetes  . Hypertension     Past Surgical History:  Procedure Laterality Date  . ABDOMINAL HYSTERECTOMY     BSO  . ABDOMINAL SURGERY     for bleeding after hysterectomy  . CHOLECYSTECTOMY    . COLONOSCOPY  2007   Dr. Gala Romney: internal hemorrhoids, few scattered sigmoid diverticula  . COLONOSCOPY N/A 04/09/2014   JSH:FWYOVZC diverticulosis. Redundant colon. Single colonic polyp-removed as described above.  . ESOPHAGOGASTRODUODENOSCOPY  2008   Dr. Gala Romney: erosive esophagitis, patulous EG junction  . ESOPHAGOGASTRODUODENOSCOPY N/A 04/09/2014   HYI:FOYDXAJ reflux esophagitis. Schatzki's ring-status post dilation as described above. Hiatal hernia. Gastricerosions-status post gastric biopsy  . HIP CLOSED REDUCTION Left 01/15/2016   Procedure: CLOSED REDUCTION LEFT HIP PROSTHESIS;  Surgeon: Carole Civil, MD;  Location: [facility];  Service: Orthopedics;  Laterality: Left;  . left hip replacement  12/08/2015  . MALONEY DILATION  N/A 04/09/2014   Procedure: Venia Minks DILATION;  Surgeon: Daneil Dolin, MD;  [facility];  Service: Endoscopy;  Laterality: N/A;  . REPLACEMENT TOTAL KNEE     bilateral  . SAVORY DILATION N/A 04/09/2014   Procedure: SAVORY DILATION;  Surgeon: Daneil Dolin, MD;  [facility];  Service: Endoscopy;  Laterality: N/A;  . TOENAIL EXCISION     R great toenail; artificial nail  . TOTAL HIP ARTHROPLASTY Left 12/08/2015   Procedure: TOTAL HIP ARTHROPLASTY;  Surgeon: Carole Civil, MD;  Location: [facility];  Service: Orthopedics;  Laterality: Left;  . TOTAL HIP REVISION Left 01/26/2016   Procedure: LEFT TOTAL HIP REVISION;  Surgeon: Carole Civil, MD;  Location: [facility];  Service: Orthopedics;  Laterality: Left;    Social History Social History  Substance Use Topics  . Smoking status: Former Smoker    Packs/day: 1.00    Years: 54.00    Types: Cigarettes    Quit date: 01/17/2016  . Smokeless tobacco: Never Used     Comment: Smoked [phone] up to 1 pack per day; she did stop  9 months during pregnancy  . Alcohol use No    Allergies  Allergen Reactions  . Gabapentin Other (See Comments)    suicidal thoughts    Current Meds  Medication Sig  . albuterol (ACCUNEB) 1.25 MG/3ML nebulizer solution Take 1 ampule by nebulization every 6 (six) hours as needed for wheezing or shortness of breath.  Marland Kitchen albuterol (PROVENTIL  HFA;VENTOLIN HFA) 108 (90 BASE) MCG/ACT inhaler Inhale 2 puffs into the lungs every 4 (four) hours as needed for wheezing or shortness of breath.  Marland Kitchen aspirin EC 325 MG tablet Take 325 mg by mouth 2 (two) times daily.  Marland Kitchen atenolol (TENORMIN) 50 MG tablet Take 50 mg by mouth daily.  . calcium-vitamin D (OSCAL WITH D) 500-200 MG-UNIT tablet Take 1 tablet by mouth daily.  . Cholecalciferol (VITAMIN D3) 1000 units CAPS Take 1 capsule by mouth daily.   Marland Kitchen docusate sodium (COLACE) 100 MG capsule Take 100 mg by mouth 2 (two) times daily.  . ferrous sulfate 325 (65 FE)  MG EC tablet Take 325 mg by mouth daily. 5:00 pm  . insulin NPH Human (HUMULIN N,NOVOLIN N) 100 UNIT/ML injection Inject 20 Units into the skin daily.   . insulin regular (NOVOLIN R,HUMULIN R) 100 units/mL injection Inject 5 Units into the skin 3 (three) times daily before meals. If Fasting Blood sugar is >150 give 3 units in addition to scheduled 10 units  . lisinopril (PRINIVIL,ZESTRIL) 10 MG tablet Take 10 mg by mouth every evening.   . magnesium hydroxide (MILK OF MAGNESIA) 400 MG/5ML suspension Take 30 mLs by mouth daily as needed for mild constipation.  . meloxicam (MOBIC) 15 MG tablet Take 1 tablet by mouth daily.  . polyethylene glycol (MIRALAX / GLYCOLAX) packet Take 17 g by mouth daily as needed for mild constipation.  . triamterene-hydrochlorothiazide (DYAZIDE) 37.5-25 MG capsule Take 1 capsule by mouth daily.  . verapamil (CALAN-SR) 240 MG CR tablet Take 1 tablet by mouth daily.  . [DISCONTINUED] HYDROcodone-acetaminophen (NORCO) 5-325 MG tablet Take 1 tablet by mouth every 8 (eight) hours as needed for moderate pain.  . [DISCONTINUED] oxyCODONE-acetaminophen (PERCOCET/ROXICET) 5-325 MG tablet Take 1 tablet by mouth every 4 (four) hours as needed for moderate pain.      Physical Exam Physical Exam There were no vitals taken for this visit.  Gen. appearance. The patient is well-developed and well-nourished, grooming and hygiene are normal. There are no gross congenital abnormalities  The patient is alert and oriented to person place and time  Mood and affect are normal  Ambulation She is walking with an abductor lurch from abductor weakness  Examination reveals the following: On inspection we find tenderness in the lower back at L4 and 5 and the left side of the lumbar and buttocks area  With the range of motion of  left hip active hip flexion standing 60 and in the supine position flexion of the hip with the leg straight 45  Stability tests were normal  in the left  hip  Strength tests revealed grade 4 motor strength left hip flexor  Skin we find no rash ulceration or erythema  Sensation remains intact  Impression vascular system shows no peripheral edema  Data Reviewed MRI from 2017 shows multilevel disc disease with disc herniation affecting the L3 and L2 root  Assessment    Encounter Diagnosis  Name Primary?  . Protruded lumbar disc Yes       Plan    Recommend epidural injection lumbar area follow-up 6 weeks  Meds ordered this encounter  Medications  . HYDROcodone-acetaminophen (NORCO/VICODIN) 5-325 MG tablet    Sig: Take 1 tablet by mouth every 6 (six) hours as needed for moderate pain.    Dispense:  30 tablet    Refill:  0          Arther Abbott 09/19/2016, 9:36 AM

## 2016-09-25 ENCOUNTER — Ambulatory Visit
Admission: RE | Admit: 2016-09-25 | Discharge: 2016-09-25 | Disposition: A | Discharge status: Home / Self Care | Payer: Medicare Other | Source: Ambulatory Visit | Attending: Orthopedic Surgery | Admitting: Orthopedic Surgery

## 2016-09-25 ENCOUNTER — Other Ambulatory Visit: Payer: Self-pay | Admitting: Orthopedic Surgery

## 2016-09-25 DIAGNOSIS — M5126 Other intervertebral disc displacement, lumbar region: Secondary | ICD-10-CM

## 2016-09-25 MED ORDER — METHYLPREDNISOLONE ACETATE 40 MG/ML INJ SUSP (RADIOLOG
120.0000 mg | Freq: Once | INTRAMUSCULAR | Status: AC
  Administered 2016-09-25: 120 mg via EPIDURAL

## 2016-09-25 MED ORDER — IOPAMIDOL (ISOVUE-M 200) INJECTION 41%
1.0000 mL | Freq: Once | INTRAMUSCULAR | Status: AC
  Administered 2016-09-25: 1 mL via EPIDURAL

## 2016-09-25 NOTE — Discharge Instructions (Signed)

## 2016-09-26 DIAGNOSIS — I10 Essential (primary) hypertension: Secondary | ICD-10-CM | POA: Diagnosis not present

## 2016-09-26 DIAGNOSIS — E1165 Type 2 diabetes mellitus with hyperglycemia: Secondary | ICD-10-CM | POA: Diagnosis not present

## 2016-10-23 ENCOUNTER — Ambulatory Visit (INDEPENDENT_AMBULATORY_CARE_PROVIDER_SITE_OTHER): Payer: Medicare Other | Admitting: Orthopedic Surgery

## 2016-10-23 ENCOUNTER — Encounter: Payer: Self-pay | Admitting: Orthopedic Surgery

## 2016-10-23 VITALS — Wt 200.6 lb

## 2016-10-23 DIAGNOSIS — M76892 Other specified enthesopathies of left lower limb, excluding foot: Secondary | ICD-10-CM | POA: Diagnosis not present

## 2016-10-23 DIAGNOSIS — M48061 Spinal stenosis, lumbar region without neurogenic claudication: Secondary | ICD-10-CM

## 2016-10-23 DIAGNOSIS — E1165 Type 2 diabetes mellitus with hyperglycemia: Secondary | ICD-10-CM | POA: Diagnosis not present

## 2016-10-23 DIAGNOSIS — M5126 Other intervertebral disc displacement, lumbar region: Secondary | ICD-10-CM | POA: Diagnosis not present

## 2016-10-23 DIAGNOSIS — I10 Essential (primary) hypertension: Secondary | ICD-10-CM | POA: Diagnosis not present

## 2016-10-23 DIAGNOSIS — Z96642 Presence of left artificial hip joint: Secondary | ICD-10-CM | POA: Diagnosis not present

## 2016-10-23 NOTE — Progress Notes (Signed)
FOLLOW UP VISIT   Patient ID: [name], female   DOB: 1950-09-24, 66 y.o.   MRN: [id number]  Chief Complaint  Patient presents with  . Follow-up    BACK PAIN    HPI [name] is a 66 y.o. female.   HPI  66 year old female status post left total hip followed by revision for vertical cup presents after action lumbar spine epidural for back and leg pain.   Review of Systems Review of Systems   right hip pain after falling injuring her right hip with contusion  Physical Exam  Constitutional: She is oriented to person, place, and time. She appears well-developed and well-nourished. No distress.  Cardiovascular: Normal rate and intact distal pulses.   Neurological: She is alert and oriented to person, place, and time. She has normal reflexes. She exhibits normal muscle tone. Coordination normal.  Skin: Skin is warm and dry. No rash noted. She is not diaphoretic. No erythema. No pallor.  Psychiatric: She has a normal mood and affect. Her behavior is normal. Judgment and thought content normal.  Her gait is marked by abductor weakness  Normal hip flexion on both sides.   MEDICAL DECISION MAKING  DATA Epidural injection report reviewed  DIAGNOSIS  Encounter Diagnoses  Name Primary?  . Status post left hip replacement   . Protruded lumbar disc   . Hip flexor tendinitis, left   . Spinal stenosis of lumbar region without neurogenic claudication Yes     PLAN(RISK)     follow up in 3 months okay to return to [facility]

## 2016-10-31 ENCOUNTER — Ambulatory Visit: Payer: Self-pay | Admitting: Orthopedic Surgery

## 2016-11-29 DIAGNOSIS — E1165 Type 2 diabetes mellitus with hyperglycemia: Secondary | ICD-10-CM | POA: Diagnosis not present

## 2016-11-29 DIAGNOSIS — M79672 Pain in left foot: Secondary | ICD-10-CM | POA: Diagnosis not present

## 2017-01-16 ENCOUNTER — Ambulatory Visit (INDEPENDENT_AMBULATORY_CARE_PROVIDER_SITE_OTHER): Payer: Medicare Other | Admitting: Orthopedic Surgery

## 2017-01-16 ENCOUNTER — Encounter: Payer: Self-pay | Admitting: Orthopedic Surgery

## 2017-01-16 DIAGNOSIS — Z96642 Presence of left artificial hip joint: Secondary | ICD-10-CM | POA: Diagnosis not present

## 2017-01-16 DIAGNOSIS — M48061 Spinal stenosis, lumbar region without neurogenic claudication: Secondary | ICD-10-CM

## 2017-01-16 DIAGNOSIS — M1990 Unspecified osteoarthritis, unspecified site: Secondary | ICD-10-CM | POA: Diagnosis not present

## 2017-01-16 MED ORDER — MELOXICAM 15 MG PO TABS: 15.0000 mg | ORAL_TABLET | Freq: Every day | ORAL | 5 refills | Status: DC

## 2017-01-16 NOTE — Progress Notes (Signed)
Progress Note   Patient ID: [name], female   DOB: 19-Feb-1951, 66 y.o.   MRN: [id number]  Chief Complaint  Patient presents with  . Follow-up    3 month recheck on DDD of L-spine.    HPI [name] is a 66 y.o. female.   HPI The patient is 66 years old she's had multiple joint replacement she has degenerative disc disease lumbar spine she's here for follow-up. She would like removed with refill. She says she is doing very well with some occasional back pain  However, she does indicate that she's fallen over 51 recently where she stepped off of a porch and it had no railings and she landed on her left side. She says she was able to get up immediately and had no residual symptoms  Review of Systems Review of Systems Normal neuro  Denies fever  Examination There were no vitals taken for this visit.  Gen. appearance the patient's appearance is normal with normal grooming and  hygiene The patient is oriented to person place and time Mood and affect are normal   Ortho Exam Stability tests are normal in both hips Motor exam 5/5 manual muscle testing , no atrophy in both legs Her gait is remarkable for in Trendelenburg gait   Medical decision-making Diagnosis, Data, Plan (risk)  Encounter Diagnoses  Name Primary?  Marland Kitchen Arthritis Yes  . Status post left hip replacement   . Spinal stenosis of lumbar region without neurogenic claudication      Arther Abbott, MD 01/16/2017 9:58 AM

## 2017-01-30 DIAGNOSIS — J40 Bronchitis, not specified as acute or chronic: Secondary | ICD-10-CM | POA: Diagnosis not present

## 2017-01-30 DIAGNOSIS — R05 Cough: Secondary | ICD-10-CM | POA: Diagnosis not present

## 2017-01-30 DIAGNOSIS — Z720 Tobacco use: Secondary | ICD-10-CM | POA: Diagnosis not present

## 2017-01-30 DIAGNOSIS — R062 Wheezing: Secondary | ICD-10-CM | POA: Diagnosis not present

## 2017-01-30 DIAGNOSIS — R0602 Shortness of breath: Secondary | ICD-10-CM | POA: Diagnosis not present

## 2017-01-30 DIAGNOSIS — J209 Acute bronchitis, unspecified: Secondary | ICD-10-CM | POA: Diagnosis not present

## 2017-03-05 DIAGNOSIS — I10 Essential (primary) hypertension: Secondary | ICD-10-CM | POA: Diagnosis not present

## 2017-03-05 DIAGNOSIS — J449 Chronic obstructive pulmonary disease, unspecified: Secondary | ICD-10-CM | POA: Diagnosis not present

## 2017-03-05 DIAGNOSIS — Z720 Tobacco use: Secondary | ICD-10-CM | POA: Diagnosis not present

## 2017-03-05 DIAGNOSIS — E118 Type 2 diabetes mellitus with unspecified complications: Secondary | ICD-10-CM | POA: Diagnosis not present

## 2017-03-12 DIAGNOSIS — H35033 Hypertensive retinopathy, bilateral: Secondary | ICD-10-CM | POA: Diagnosis not present

## 2017-03-12 DIAGNOSIS — E109 Type 1 diabetes mellitus without complications: Secondary | ICD-10-CM | POA: Diagnosis not present

## 2017-03-12 DIAGNOSIS — I10 Essential (primary) hypertension: Secondary | ICD-10-CM | POA: Diagnosis not present

## 2017-03-12 DIAGNOSIS — E103293 Type 1 diabetes mellitus with mild nonproliferative diabetic retinopathy without macular edema, bilateral: Secondary | ICD-10-CM | POA: Diagnosis not present

## 2017-05-14 ENCOUNTER — Other Ambulatory Visit: Payer: Self-pay | Admitting: Orthopedic Surgery

## 2017-05-14 ENCOUNTER — Telehealth: Payer: Self-pay | Admitting: Orthopedic Surgery

## 2017-05-14 DIAGNOSIS — M545 Low back pain: Principal | ICD-10-CM

## 2017-05-14 DIAGNOSIS — G8929 Other chronic pain: Secondary | ICD-10-CM

## 2017-05-14 NOTE — Telephone Encounter (Signed)
Patient stopped by to ask if she needs a new order for an epidural injection at [facility].  States back is hurting quite a bit again.  Please advise.

## 2017-05-15 ENCOUNTER — Other Ambulatory Visit: Payer: Self-pay | Admitting: *Deleted

## 2017-05-15 DIAGNOSIS — G8929 Other chronic pain: Secondary | ICD-10-CM

## 2017-05-15 DIAGNOSIS — M5440 Lumbago with sciatica, unspecified side: Principal | ICD-10-CM

## 2017-05-17 ENCOUNTER — Ambulatory Visit
Admission: RE | Admit: 2017-05-17 | Discharge: 2017-05-17 | Disposition: A | Discharge status: Home / Self Care | Payer: Medicare Other | Source: Ambulatory Visit | Attending: Orthopedic Surgery | Admitting: Orthopedic Surgery

## 2017-05-17 ENCOUNTER — Other Ambulatory Visit: Payer: Self-pay | Admitting: Orthopedic Surgery

## 2017-05-17 DIAGNOSIS — M545 Low back pain: Principal | ICD-10-CM

## 2017-05-17 DIAGNOSIS — G8929 Other chronic pain: Secondary | ICD-10-CM

## 2017-05-17 DIAGNOSIS — M47816 Spondylosis without myelopathy or radiculopathy, lumbar region: Secondary | ICD-10-CM | POA: Diagnosis not present

## 2017-05-17 MED ORDER — METHYLPREDNISOLONE ACETATE 40 MG/ML INJ SUSP (RADIOLOG
120.0000 mg | Freq: Once | INTRAMUSCULAR | Status: AC
  Administered 2017-05-17: 120 mg via EPIDURAL

## 2017-05-17 MED ORDER — IOPAMIDOL (ISOVUE-M 200) INJECTION 41%
1.0000 mL | Freq: Once | INTRAMUSCULAR | Status: AC
  Administered 2017-05-17: 1 mL via EPIDURAL

## 2017-05-17 NOTE — Discharge Instructions (Signed)

## 2017-05-28 DIAGNOSIS — I10 Essential (primary) hypertension: Secondary | ICD-10-CM | POA: Diagnosis not present

## 2017-05-28 DIAGNOSIS — E118 Type 2 diabetes mellitus with unspecified complications: Secondary | ICD-10-CM | POA: Diagnosis not present

## 2017-06-27 DIAGNOSIS — Z0000 Encounter for general adult medical examination without abnormal findings: Secondary | ICD-10-CM | POA: Diagnosis not present

## 2017-06-27 DIAGNOSIS — Z23 Encounter for immunization: Secondary | ICD-10-CM | POA: Diagnosis not present

## 2017-07-19 IMAGING — DX DG HIP (WITH OR WITHOUT PELVIS) 2-3V*L*
3 series · 3 of 3 positions shown · non-contrast
Comparison: 12/08/2015

CLINICAL DATA: Left hip pain.  Felt a pop while sitting in a chair.

EXAM:
DG HIP (WITH OR WITHOUT PELVIS) 2-3V LEFT

[pelvis ap]
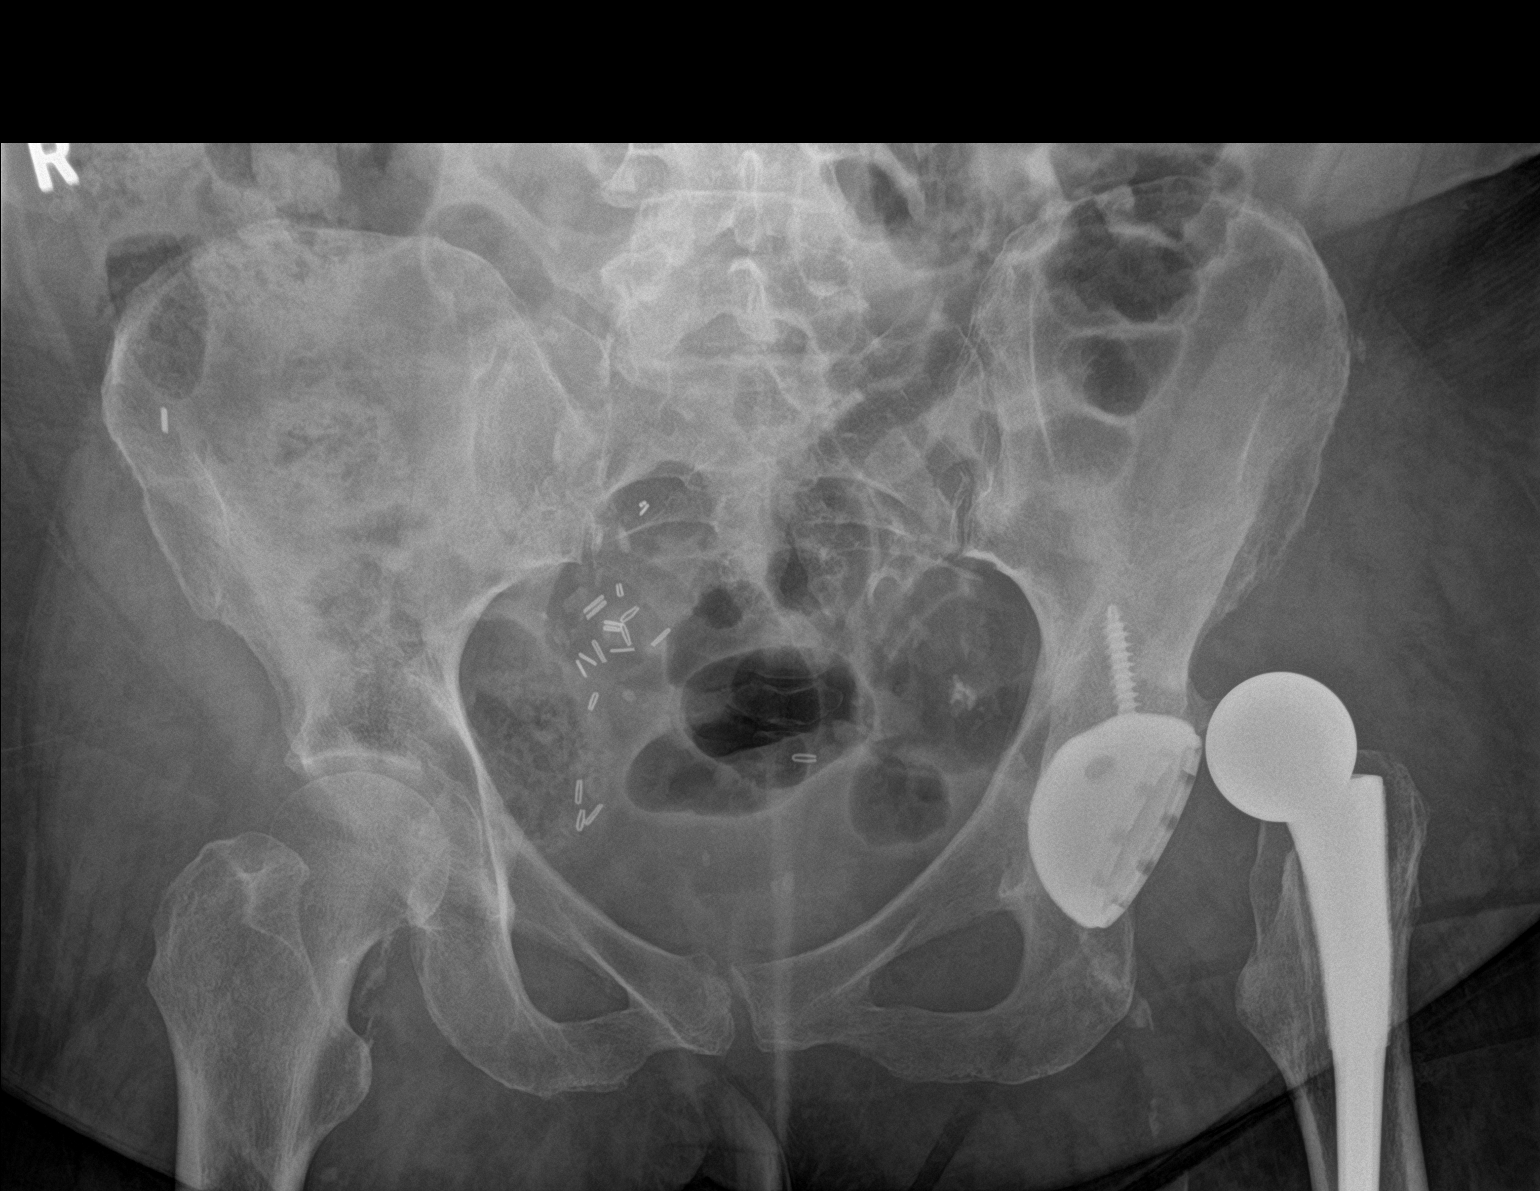

[hip ap]
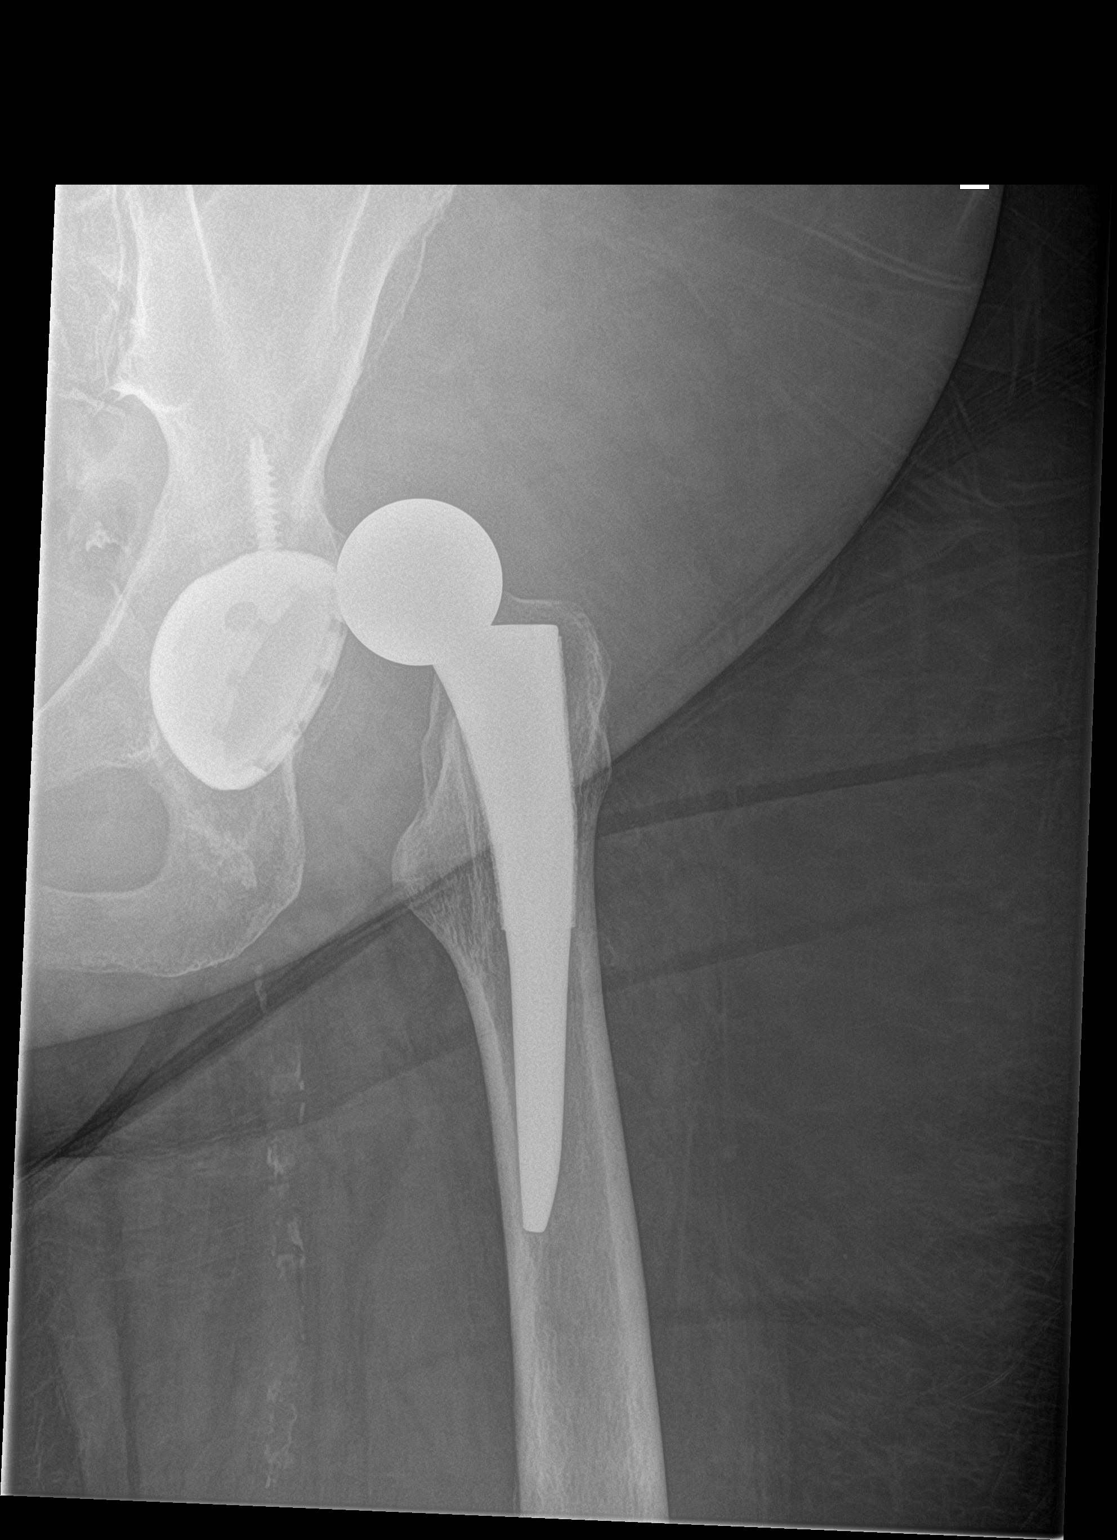

[hip lat]
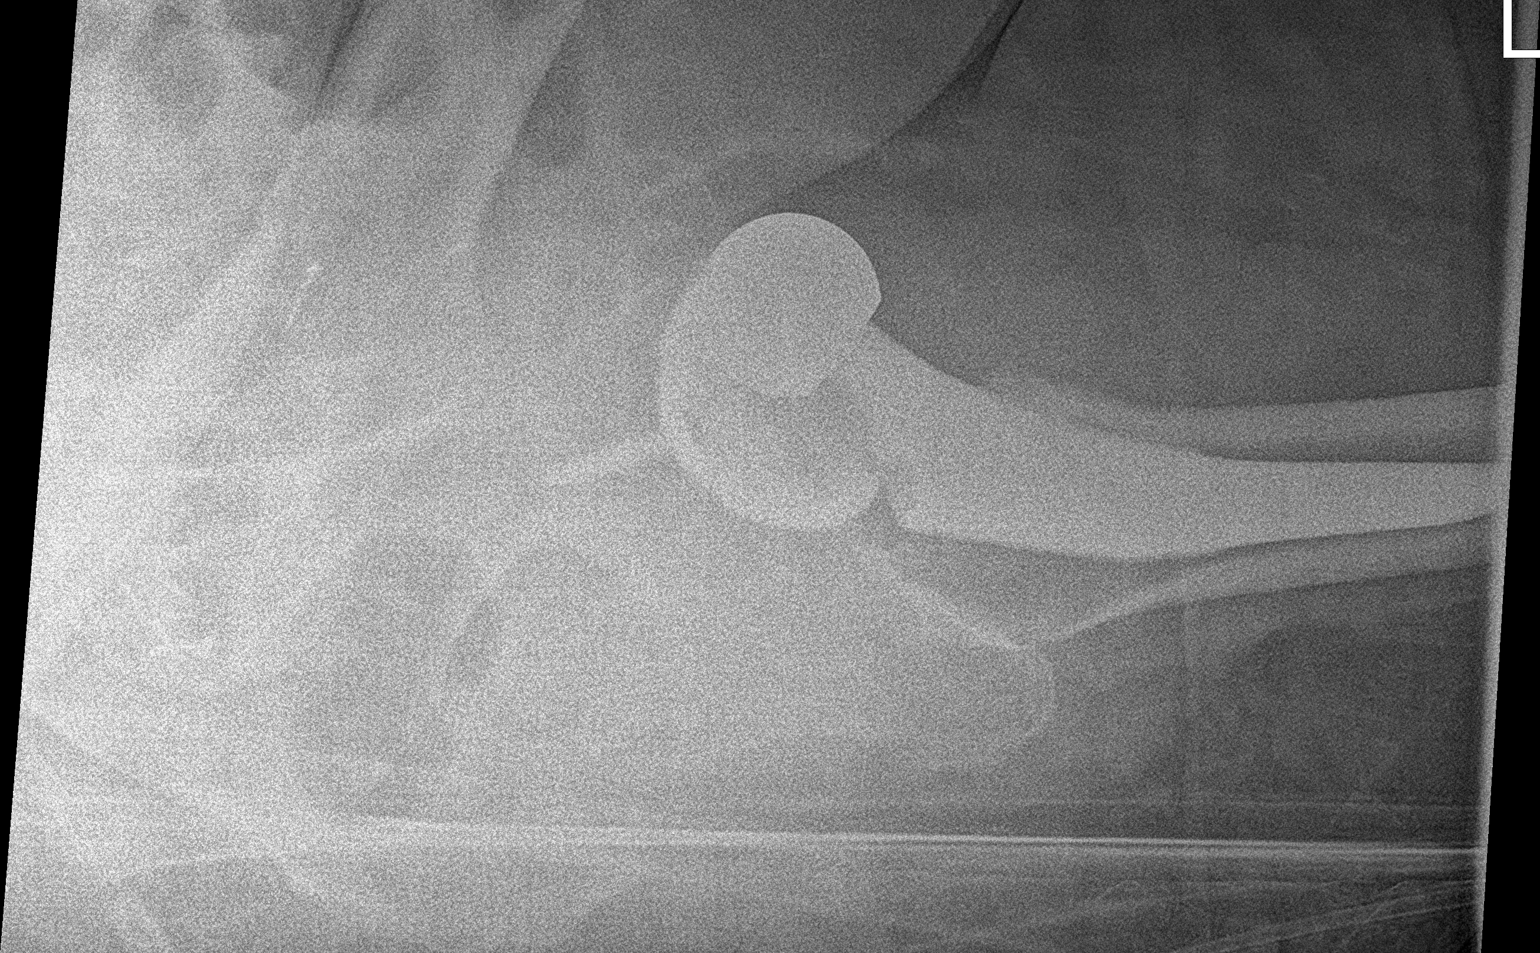

[3 of 3 positions shown; findings below may reference images not displayed]

FINDINGS: Left total hip arthroplasty using non cemented components and screw
fixation of the acetabular component. Anterior and superior
dislocation of the femoral prosthesis from the acetabular cup. Steep
acetabular angle. No acute fractures demonstrated. Vascular
calcifications. Surgical clips in the pelvis. Pelvis appears intact.
SI joints and symphysis pubis are not displaced.
IMPRESSION: Anterior and superior dislocation of the left hip femoral prosthesis
from the acetabular cup.

## 2017-07-27 ENCOUNTER — Other Ambulatory Visit (HOSPITAL_COMMUNITY): Payer: Self-pay | Admitting: Family Medicine

## 2017-07-27 DIAGNOSIS — Z1231 Encounter for screening mammogram for malignant neoplasm of breast: Secondary | ICD-10-CM

## 2017-07-28 IMAGING — CR DG HIP (WITH OR WITHOUT PELVIS) 1V PORT*R*
1 series · 1 of 1 positions shown · non-contrast
Comparison: Left hip radiographs performed 01/14/2016

CLINICAL DATA: Attempted reduction of left hip prosthesis
dislocation. Initial encounter.

EXAM:
DG HIP (WITH OR WITHOUT PELVIS) 1V PORT RIGHT

[ap]
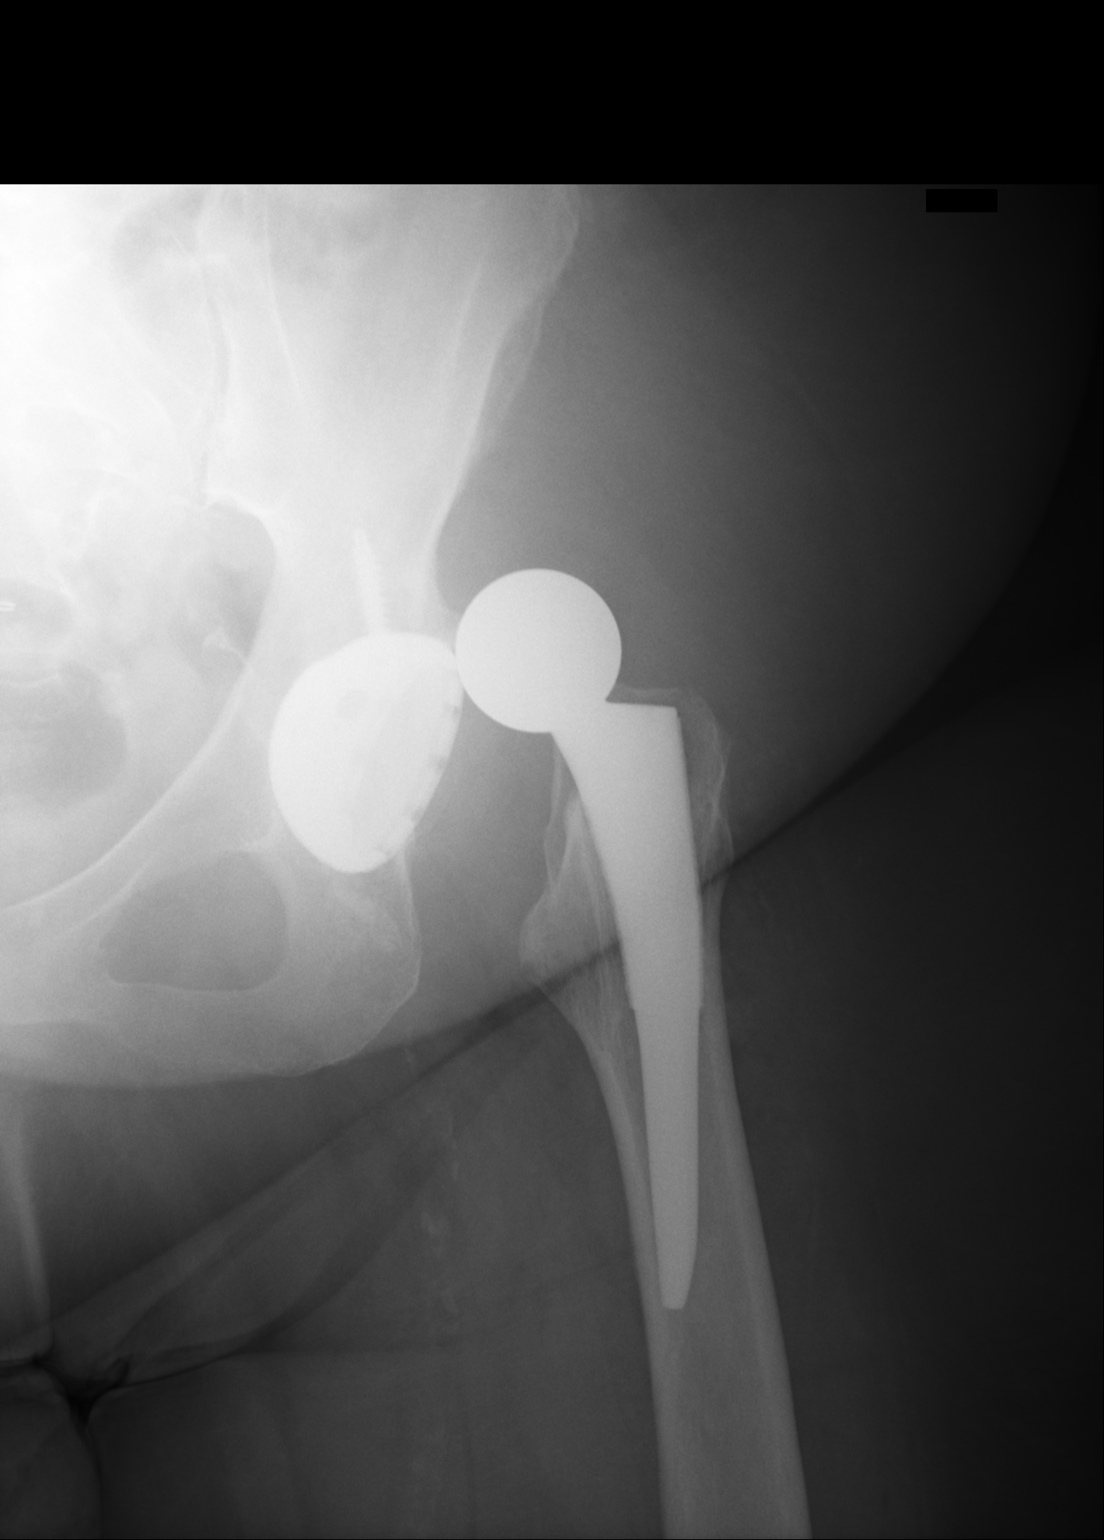

[1 of 1 positions shown; findings below may reference images not displayed]

FINDINGS: There is persistent superior dislocation of the patient's left hip
prosthesis. There is no evidence of fracture.

The left sacroiliac joint is grossly unremarkable. Scattered
vascular calcifications are seen.
IMPRESSION: Persistent superior dislocation of the left hip prosthesis. No
evidence of fracture.

## 2017-08-27 ENCOUNTER — Ambulatory Visit (HOSPITAL_COMMUNITY): Payer: Medicare Other

## 2017-08-30 ENCOUNTER — Ambulatory Visit (HOSPITAL_COMMUNITY)
Admission: RE | Admit: 2017-08-30 | Discharge: 2017-08-30 | Disposition: A | Discharge status: Home / Self Care | Payer: Medicare Other | Source: Ambulatory Visit | Attending: Family Medicine | Admitting: Family Medicine

## 2017-08-30 ENCOUNTER — Encounter (HOSPITAL_COMMUNITY): Payer: Self-pay

## 2017-08-30 DIAGNOSIS — Z1231 Encounter for screening mammogram for malignant neoplasm of breast: Secondary | ICD-10-CM | POA: Insufficient documentation

## 2017-09-03 DIAGNOSIS — I10 Essential (primary) hypertension: Secondary | ICD-10-CM | POA: Diagnosis not present

## 2017-09-03 DIAGNOSIS — E782 Mixed hyperlipidemia: Secondary | ICD-10-CM | POA: Diagnosis not present

## 2017-09-03 DIAGNOSIS — J449 Chronic obstructive pulmonary disease, unspecified: Secondary | ICD-10-CM | POA: Diagnosis not present

## 2017-09-03 DIAGNOSIS — E785 Hyperlipidemia, unspecified: Secondary | ICD-10-CM | POA: Diagnosis not present

## 2017-09-03 DIAGNOSIS — E1169 Type 2 diabetes mellitus with other specified complication: Secondary | ICD-10-CM | POA: Diagnosis not present

## 2017-09-03 DIAGNOSIS — E1165 Type 2 diabetes mellitus with hyperglycemia: Secondary | ICD-10-CM | POA: Diagnosis not present

## 2017-11-16 ENCOUNTER — Ambulatory Visit: Payer: Medicare Other | Admitting: Orthopedic Surgery

## 2017-11-22 DIAGNOSIS — Z96642 Presence of left artificial hip joint: Secondary | ICD-10-CM | POA: Insufficient documentation

## 2017-11-25 NOTE — Progress Notes (Signed)
Patient ID: [name], female   DOB: 11-26-1950, 66 y.o.   MRN: [id number]  Chief Complaint  Patient presents with  . Post-op Follow-up    left hip 12/08/15 Darryl Nestle 01/26/16     HPI [name] is a 67 y.o. female.   Status post  total hip arthroplasty. The patient is doing well the hip implant is functioning well.  Review of Systems Review of Systems  Musculoskeletal: Positive for back pain.     Constitutional symptoms no fever or chills Neurologic symptoms no numbness or tingling  Past Medical History:  Diagnosis Date  . Cancer ([place])    ovarian  . Carpal tunnel syndrome    Nocturnal and positional, bilateral  . COPD (chronic obstructive pulmonary disease) ([place])   . Degenerative joint disease   . Diabetes mellitus    Insulin-dependent diabetes  . Hypertension     Past Surgical History:  Procedure Laterality Date  . ABDOMINAL HYSTERECTOMY     BSO  . ABDOMINAL SURGERY     for bleeding after hysterectomy  . CHOLECYSTECTOMY    . COLONOSCOPY  2007   Dr. Gala Romney: internal hemorrhoids, few scattered sigmoid diverticula  . COLONOSCOPY N/A 04/09/2014   LFY:BOFBPZW diverticulosis. Redundant colon. Single colonic polyp-removed as described above.  . ESOPHAGOGASTRODUODENOSCOPY  2008   Dr. Gala Romney: erosive esophagitis, patulous EG junction  . ESOPHAGOGASTRODUODENOSCOPY N/A 04/09/2014   CHE:NIDPOEU reflux esophagitis. Schatzki's ring-status post dilation as described above. Hiatal hernia. Gastricerosions-status post gastric biopsy  . HIP CLOSED REDUCTION Left 01/15/2016   Procedure: CLOSED REDUCTION LEFT HIP PROSTHESIS;  Surgeon: Carole Civil, MD;  Location: [facility];  Service: Orthopedics;  Laterality: Left;  . left hip replacement  12/08/2015  . MALONEY DILATION N/A 04/09/2014   Procedure: Venia Minks DILATION;  Surgeon: Daneil Dolin, MD;  [facility];  Service: Endoscopy;  Laterality: N/A;  . REPLACEMENT TOTAL KNEE     bilateral  . SAVORY DILATION N/A  04/09/2014   Procedure: SAVORY DILATION;  Surgeon: Daneil Dolin, MD;  [facility];  Service: Endoscopy;  Laterality: N/A;  . TOENAIL EXCISION     R great toenail; artificial nail  . TOTAL HIP ARTHROPLASTY Left 12/08/2015   Procedure: TOTAL HIP ARTHROPLASTY;  Surgeon: Carole Civil, MD;  Location: [facility];  Service: Orthopedics;  Laterality: Left;  . TOTAL HIP REVISION Left 01/26/2016   Procedure: LEFT TOTAL HIP REVISION;  Surgeon: Carole Civil, MD;  Location: [facility];  Service: Orthopedics;  Laterality: Left;     Allergies  Allergen Reactions  . Gabapentin Other (See Comments)    suicidal thoughts    Current Outpatient Medications  Medication Sig Dispense Refill  . albuterol (ACCUNEB) 1.25 MG/3ML nebulizer solution Take 1 ampule by nebulization every 6 (six) hours as needed for wheezing or shortness of breath.    Marland Kitchen albuterol (PROVENTIL HFA;VENTOLIN HFA) 108 (90 BASE) MCG/ACT inhaler Inhale 2 puffs into the lungs every 4 (four) hours as needed for wheezing or shortness of breath. 1 Inhaler 1  . aspirin EC 325 MG tablet Take 325 mg by mouth 2 (two) times daily.    Marland Kitchen atenolol (TENORMIN) 50 MG tablet Take 50 mg by mouth daily.    . calcium-vitamin D (OSCAL WITH D) 500-200 MG-UNIT tablet Take 1 tablet by mouth daily.    . Cholecalciferol (VITAMIN D3) 1000 units CAPS Take 1 capsule by mouth daily.     Marland Kitchen docusate sodium (COLACE) 100 MG capsule Take 100  mg by mouth 2 (two) times daily.    Marland Kitchen HYDROcodone-acetaminophen (NORCO/VICODIN) 5-325 MG tablet Take 1 tablet by mouth every 6 (six) hours as needed for moderate pain. 30 tablet 0  . lisinopril (PRINIVIL,ZESTRIL) 10 MG tablet Take 10 mg by mouth every evening.     . meloxicam (MOBIC) 15 MG tablet Take 1 tablet (15 mg total) by mouth daily. 60 tablet 5  . triamterene-hydrochlorothiazide (DYAZIDE) 37.5-25 MG capsule Take 1 capsule by mouth daily.    . verapamil (CALAN-SR) 240 MG CR tablet Take 1 tablet by mouth daily.     No  current facility-administered medications for this visit.      Physical Exam There were no vitals taken for this visit.  Overall appearance normal grooming and hygiene normal. The patient is awake alert and oriented 3 with a pleasant mood and affect. The patient is ambulatory without assistive device and without limping.  left HIP: The skin incision is healed without any erythema or tenderness or neuroma. Hip flexion strength grade 5. Hip is stable to post pull and abduction stress test. Hip flexion is 120. There is no tenderness or swelling around the hip. Distal pulses are intact sensation is normal and there is no lymphadenopathy in the groin patient walks with normal balance and coordination    Data Reviewed Today's imaging shows stable left total hip implant without loosening or complication  Assessment Status post left total hip, stable   Plan Routine repeat x-ray right hip in one year  9:07 AM Arther Abbott, MD 11/25/2017

## 2017-11-26 ENCOUNTER — Ambulatory Visit: Payer: Medicare Other | Admitting: Orthopedic Surgery

## 2017-11-26 ENCOUNTER — Encounter: Payer: Self-pay | Admitting: Orthopedic Surgery

## 2017-11-26 ENCOUNTER — Ambulatory Visit (INDEPENDENT_AMBULATORY_CARE_PROVIDER_SITE_OTHER): Payer: Medicare Other

## 2017-11-26 DIAGNOSIS — M1990 Unspecified osteoarthritis, unspecified site: Secondary | ICD-10-CM | POA: Diagnosis not present

## 2017-11-26 DIAGNOSIS — Z96642 Presence of left artificial hip joint: Secondary | ICD-10-CM

## 2017-11-26 MED ORDER — MELOXICAM 15 MG PO TABS: 15.0000 mg | ORAL_TABLET | Freq: Every day | ORAL | 5 refills | Status: DC

## 2017-12-03 DIAGNOSIS — M545 Low back pain: Secondary | ICD-10-CM | POA: Diagnosis not present

## 2017-12-03 DIAGNOSIS — I10 Essential (primary) hypertension: Secondary | ICD-10-CM | POA: Diagnosis not present

## 2017-12-03 DIAGNOSIS — E1165 Type 2 diabetes mellitus with hyperglycemia: Secondary | ICD-10-CM | POA: Diagnosis not present

## 2017-12-31 ENCOUNTER — Other Ambulatory Visit (HOSPITAL_COMMUNITY): Payer: Self-pay | Admitting: Family Medicine

## 2017-12-31 ENCOUNTER — Ambulatory Visit (HOSPITAL_COMMUNITY)
Admission: RE | Admit: 2017-12-31 | Discharge: 2017-12-31 | Disposition: A | Discharge status: Home / Self Care | Payer: Medicare Other | Source: Ambulatory Visit | Attending: Family Medicine | Admitting: Family Medicine

## 2017-12-31 DIAGNOSIS — R0789 Other chest pain: Secondary | ICD-10-CM

## 2017-12-31 DIAGNOSIS — E1165 Type 2 diabetes mellitus with hyperglycemia: Secondary | ICD-10-CM | POA: Diagnosis not present

## 2017-12-31 DIAGNOSIS — J9811 Atelectasis: Secondary | ICD-10-CM | POA: Insufficient documentation

## 2017-12-31 DIAGNOSIS — R918 Other nonspecific abnormal finding of lung field: Secondary | ICD-10-CM | POA: Diagnosis not present

## 2017-12-31 DIAGNOSIS — I517 Cardiomegaly: Secondary | ICD-10-CM | POA: Insufficient documentation

## 2017-12-31 DIAGNOSIS — I10 Essential (primary) hypertension: Secondary | ICD-10-CM | POA: Diagnosis not present

## 2018-01-30 DIAGNOSIS — I10 Essential (primary) hypertension: Secondary | ICD-10-CM | POA: Diagnosis not present

## 2018-01-30 DIAGNOSIS — E1165 Type 2 diabetes mellitus with hyperglycemia: Secondary | ICD-10-CM | POA: Diagnosis not present

## 2018-01-30 DIAGNOSIS — M542 Cervicalgia: Secondary | ICD-10-CM | POA: Diagnosis not present

## 2018-02-07 DIAGNOSIS — Z0000 Encounter for general adult medical examination without abnormal findings: Secondary | ICD-10-CM | POA: Diagnosis not present

## 2018-03-04 DIAGNOSIS — Z794 Long term (current) use of insulin: Secondary | ICD-10-CM | POA: Diagnosis not present

## 2018-03-04 DIAGNOSIS — Z720 Tobacco use: Secondary | ICD-10-CM | POA: Diagnosis not present

## 2018-03-04 DIAGNOSIS — J449 Chronic obstructive pulmonary disease, unspecified: Secondary | ICD-10-CM | POA: Diagnosis not present

## 2018-03-04 DIAGNOSIS — I10 Essential (primary) hypertension: Secondary | ICD-10-CM | POA: Diagnosis not present

## 2018-04-01 DIAGNOSIS — I10 Essential (primary) hypertension: Secondary | ICD-10-CM | POA: Diagnosis not present

## 2018-04-01 DIAGNOSIS — J449 Chronic obstructive pulmonary disease, unspecified: Secondary | ICD-10-CM | POA: Diagnosis not present

## 2018-04-01 DIAGNOSIS — E118 Type 2 diabetes mellitus with unspecified complications: Secondary | ICD-10-CM | POA: Diagnosis not present

## 2018-04-01 DIAGNOSIS — M791 Myalgia, unspecified site: Secondary | ICD-10-CM | POA: Diagnosis not present

## 2018-04-03 ENCOUNTER — Other Ambulatory Visit (HOSPITAL_COMMUNITY): Payer: Self-pay | Admitting: Family Medicine

## 2018-04-03 DIAGNOSIS — Z720 Tobacco use: Secondary | ICD-10-CM

## 2018-04-03 DIAGNOSIS — J449 Chronic obstructive pulmonary disease, unspecified: Secondary | ICD-10-CM

## 2018-04-08 ENCOUNTER — Other Ambulatory Visit (HOSPITAL_COMMUNITY): Payer: Self-pay | Admitting: Family Medicine

## 2018-04-08 DIAGNOSIS — E2839 Other primary ovarian failure: Secondary | ICD-10-CM

## 2018-04-17 ENCOUNTER — Ambulatory Visit (HOSPITAL_COMMUNITY)
Admission: RE | Admit: 2018-04-17 | Discharge: 2018-04-17 | Disposition: A | Discharge status: Home / Self Care | Payer: Medicare Other | Source: Ambulatory Visit | Attending: Family Medicine | Admitting: Family Medicine

## 2018-04-17 DIAGNOSIS — J449 Chronic obstructive pulmonary disease, unspecified: Secondary | ICD-10-CM

## 2018-04-17 DIAGNOSIS — I2510 Atherosclerotic heart disease of native coronary artery without angina pectoris: Secondary | ICD-10-CM | POA: Diagnosis not present

## 2018-04-17 DIAGNOSIS — Z720 Tobacco use: Secondary | ICD-10-CM | POA: Diagnosis not present

## 2018-04-17 DIAGNOSIS — I700 Atherosclerosis of aorta: Secondary | ICD-10-CM | POA: Diagnosis not present

## 2018-04-17 DIAGNOSIS — Z122 Encounter for screening for malignant neoplasm of respiratory organs: Secondary | ICD-10-CM | POA: Insufficient documentation

## 2018-04-17 DIAGNOSIS — D3502 Benign neoplasm of left adrenal gland: Secondary | ICD-10-CM | POA: Insufficient documentation

## 2018-04-17 DIAGNOSIS — D3501 Benign neoplasm of right adrenal gland: Secondary | ICD-10-CM | POA: Insufficient documentation

## 2018-04-22 DIAGNOSIS — H35033 Hypertensive retinopathy, bilateral: Secondary | ICD-10-CM | POA: Diagnosis not present

## 2018-04-22 DIAGNOSIS — E1065 Type 1 diabetes mellitus with hyperglycemia: Secondary | ICD-10-CM | POA: Diagnosis not present

## 2018-04-22 DIAGNOSIS — I10 Essential (primary) hypertension: Secondary | ICD-10-CM | POA: Diagnosis not present

## 2018-04-22 DIAGNOSIS — E103293 Type 1 diabetes mellitus with mild nonproliferative diabetic retinopathy without macular edema, bilateral: Secondary | ICD-10-CM | POA: Diagnosis not present

## 2018-04-30 DIAGNOSIS — I10 Essential (primary) hypertension: Secondary | ICD-10-CM | POA: Diagnosis not present

## 2018-04-30 DIAGNOSIS — E118 Type 2 diabetes mellitus with unspecified complications: Secondary | ICD-10-CM | POA: Diagnosis not present

## 2018-04-30 DIAGNOSIS — J449 Chronic obstructive pulmonary disease, unspecified: Secondary | ICD-10-CM | POA: Diagnosis not present

## 2018-06-03 DIAGNOSIS — I10 Essential (primary) hypertension: Secondary | ICD-10-CM | POA: Diagnosis not present

## 2018-06-03 DIAGNOSIS — G5603 Carpal tunnel syndrome, bilateral upper limbs: Secondary | ICD-10-CM | POA: Diagnosis not present

## 2018-06-03 DIAGNOSIS — E118 Type 2 diabetes mellitus with unspecified complications: Secondary | ICD-10-CM | POA: Diagnosis not present

## 2018-06-05 DIAGNOSIS — M6281 Muscle weakness (generalized): Secondary | ICD-10-CM | POA: Diagnosis not present

## 2018-06-05 DIAGNOSIS — M25532 Pain in left wrist: Secondary | ICD-10-CM | POA: Diagnosis not present

## 2018-06-05 DIAGNOSIS — R202 Paresthesia of skin: Secondary | ICD-10-CM | POA: Diagnosis not present

## 2018-06-05 DIAGNOSIS — M25531 Pain in right wrist: Secondary | ICD-10-CM | POA: Diagnosis not present

## 2018-06-06 ENCOUNTER — Encounter: Payer: Self-pay | Admitting: Cardiology

## 2018-06-06 ENCOUNTER — Ambulatory Visit: Payer: Medicare Other | Admitting: Cardiology

## 2018-06-06 VITALS — BP 160/72 | HR 47 | Ht 65.0 in | Wt 242.6 lb

## 2018-06-06 DIAGNOSIS — I10 Essential (primary) hypertension: Secondary | ICD-10-CM

## 2018-06-06 DIAGNOSIS — R001 Bradycardia, unspecified: Secondary | ICD-10-CM

## 2018-06-06 DIAGNOSIS — I2510 Atherosclerotic heart disease of native coronary artery without angina pectoris: Secondary | ICD-10-CM

## 2018-06-06 MED ORDER — CHLORTHALIDONE 25 MG PO TABS: 25.0000 mg | ORAL_TABLET | Freq: Every day | ORAL | 3 refills | Status: DC

## 2018-06-06 MED ORDER — ATENOLOL 25 MG PO TABS: 25.0000 mg | ORAL_TABLET | Freq: Two times a day (BID) | ORAL | 3 refills | Status: DC

## 2018-06-06 NOTE — Progress Notes (Signed)
Clinical Summary [name] is a 67 y.o.female seen today as a new consult, referred by Dr Berdine Addison for CAD.    1. CAD - 03/2018 CT scan with incidental findings of LM and 3 vessel CAD. - no chest pain. Occasional SOB he attributes to her COPD, better with albuterol - sedentary lifestyle due to hip replacement and chronic back pain.   CAD risk factors: DM2, HTN, +tobacco x 50 years   2. Bradycardia - she is on atenolol 50mg  daily as well as verapmil 240mg  daily.   3. HTN - compliant with meds  Past Medical History:  Diagnosis Date  . Cancer ([place])    ovarian  . Carpal tunnel syndrome    Nocturnal and positional, bilateral  . COPD (chronic obstructive pulmonary disease) ([place])   . Degenerative joint disease   . Diabetes mellitus    Insulin-dependent diabetes  . Hypertension      Allergies  Allergen Reactions  . Gabapentin Other (See Comments)    suicidal thoughts     Current Outpatient Medications  Medication Sig Dispense Refill  . albuterol (ACCUNEB) 1.25 MG/3ML nebulizer solution Take 1 ampule by nebulization every 6 (six) hours as needed for wheezing or shortness of breath.    Marland Kitchen albuterol (PROVENTIL HFA;VENTOLIN HFA) 108 (90 BASE) MCG/ACT inhaler Inhale 2 puffs into the lungs every 4 (four) hours as needed for wheezing or shortness of breath. 1 Inhaler 1  . aspirin EC 325 MG tablet Take 325 mg by mouth 2 (two) times daily.    Marland Kitchen atenolol (TENORMIN) 50 MG tablet Take 50 mg by mouth daily.    . calcium-vitamin D (OSCAL WITH D) 500-200 MG-UNIT tablet Take 1 tablet by mouth daily.    . Cholecalciferol (VITAMIN D3) 1000 units CAPS Take 1 capsule by mouth daily.     Marland Kitchen docusate sodium (COLACE) 100 MG capsule Take 100 mg by mouth 2 (two) times daily.    Marland Kitchen lisinopril (PRINIVIL,ZESTRIL) 10 MG tablet Take 10 mg by mouth every evening.     . meloxicam (MOBIC) 15 MG tablet Take 1 tablet (15 mg total) by mouth daily. 60 tablet 5  . rosuvastatin (CRESTOR) 5 MG tablet     .  triamterene-hydrochlorothiazide (DYAZIDE) 37.5-25 MG capsule Take 1 capsule by mouth daily.    . verapamil (CALAN-SR) 240 MG CR tablet Take 1 tablet by mouth daily.     No current facility-administered medications for this visit.      Past Surgical History:  Procedure Laterality Date  . ABDOMINAL HYSTERECTOMY     BSO  . ABDOMINAL SURGERY     for bleeding after hysterectomy  . CHOLECYSTECTOMY    . COLONOSCOPY  2007   Dr. Gala Romney: internal hemorrhoids, few scattered sigmoid diverticula  . COLONOSCOPY N/A 04/09/2014   HUD:JSHFWYO diverticulosis. Redundant colon. Single colonic polyp-removed as described above.  . ESOPHAGOGASTRODUODENOSCOPY  2008   Dr. Gala Romney: erosive esophagitis, patulous EG junction  . ESOPHAGOGASTRODUODENOSCOPY N/A 04/09/2014   VZC:HYIFOYD reflux esophagitis. Schatzki's ring-status post dilation as described above. Hiatal hernia. Gastricerosions-status post gastric biopsy  . HIP CLOSED REDUCTION Left 01/15/2016   Procedure: CLOSED REDUCTION LEFT HIP PROSTHESIS;  Surgeon: Carole Civil, MD;  Location: [facility];  Service: Orthopedics;  Laterality: Left;  . left hip replacement  12/08/2015  . MALONEY DILATION N/A 04/09/2014   Procedure: Venia Minks DILATION;  Surgeon: Daneil Dolin, MD;  [facility];  Service: Endoscopy;  Laterality: N/A;  . REPLACEMENT TOTAL KNEE  bilateral  . SAVORY DILATION N/A 04/09/2014   Procedure: SAVORY DILATION;  Surgeon: Daneil Dolin, MD;  [facility];  Service: Endoscopy;  Laterality: N/A;  . TOENAIL EXCISION     R great toenail; artificial nail  . TOTAL HIP ARTHROPLASTY Left 12/08/2015   Procedure: TOTAL HIP ARTHROPLASTY;  Surgeon: Carole Civil, MD;  Location: [facility];  Service: Orthopedics;  Laterality: Left;  . TOTAL HIP REVISION Left 01/26/2016   Procedure: LEFT TOTAL HIP REVISION;  Surgeon: Carole Civil, MD;  Location: [facility];  Service: Orthopedics;  Laterality: Left;     Allergies  Allergen Reactions   . Gabapentin Other (See Comments)    suicidal thoughts      Family History  Problem Relation Age of Onset  . Stroke Mother   . Diabetes Mother   . Diabetes Father   . Cancer Brother        Leukemia and bone cancer  . Colon cancer Neg Hx   . Heart disease Neg Hx      Social History [name] reports that she quit smoking about 2 years ago. Her smoking use included cigarettes. She has a 54.00 pack-year smoking history. She has never used smokeless tobacco. [name] reports that she does not drink alcohol.   Review of Systems CONSTITUTIONAL: No weight loss, fever, chills, weakness or fatigue.  HEENT: Eyes: No visual loss, blurred vision, double vision or yellow sclerae.No hearing loss, sneezing, congestion, runny nose or sore throat.  SKIN: No rash or itching.  CARDIOVASCULAR: per hpi RESPIRATORY: No shortness of breath, cough or sputum.  GASTROINTESTINAL: No anorexia, nausea, vomiting or diarrhea. No abdominal pain or blood.  GENITOURINARY: No burning on urination, no polyuria NEUROLOGICAL: No headache, dizziness, syncope, paralysis, ataxia, numbness or tingling in the extremities. No change in bowel or bladder control.  MUSCULOSKELETAL: chronic hip pain, back pain LYMPHATICS: No enlarged nodes. No history of splenectomy.  PSYCHIATRIC: No history of depression or anxiety.  ENDOCRINOLOGIC: No reports of sweating, cold or heat intolerance. No polyuria or polydipsia.  Marland Kitchen   Physical Examination Vitals:   06/06/18 1349  BP: (!) 160/72  Pulse: (!) 47  SpO2: 96%   Vitals:   06/06/18 1349  Weight: 242 lb 9.6 oz (110 kg)  Height: 5\' 5"  (1.651 m)    Gen: resting comfortably, no acute distress HEENT: no scleral icterus, pupils equal round and reactive, no palptable cervical adenopathy,  CV: RRR, no m/rg, no jvd Resp: Clear to auscultation bilaterally GI: abdomen is soft, non-tender, non-distended, normal bowel sounds, no hepatosplenomegaly MSK: extremities are warm,  no edema.  Skin: warm, no rash Neuro:  no focal deficits Psych: appropriate affect    Assessment and Plan  1. CAD - LM and 3 vessel CAD noted on recent CT chest, functionality of these lesions is unknown - multiple CAD risk factors - no significant symptoms, but very sedentary lifestyle due to chronic hip and back pain. Absence of symptoms may be due to low levels of exertion - will plan for lexiscan nuclear stress 2 day protocol to better evaluate and risk assess  EKG today shows sinus bradycardia, no acute ischemic changes.   2. HTN - elevated bp's, low heart rates on atenolol and verapamil - lower atenolol to 25mg  daily, d/c maxide and start chlorthalidone 25mg  daily for more potent bp effect. Check BMET in 2 weeks   3. Bradycardia - EKG today sinus brady at 48. No symptoms. Likely due to medications atenolol  and verapamil - lower atenolol to 25mg  daily, way wean further as needed.   F/u pending test results    Arnoldo Lenis, M.D.

## 2018-06-06 NOTE — Patient Instructions (Signed)
Medication Instructions:  STOP MAXZIDE  DECREASE ATENOLOL TO 25 MG DAILY   Labwork: 2 WEEKS   BMET  MAGNESIUM  Testing/Procedures: Your physician has requested that you have a lexiscan myoview. For further information please visit [email or web address]. Please follow instruction sheet, as given.    Follow-Up: Your physician recommends that you schedule a follow-up appointment in: PENDING TEST RESULTS    Any Other Special Instructions Will Be Listed Below (If Applicable).     If you need a refill on your cardiac medications before your next appointment, please call your pharmacy.

## 2018-06-12 ENCOUNTER — Encounter (HOSPITAL_COMMUNITY): Payer: Medicare Other

## 2018-06-12 ENCOUNTER — Encounter (HOSPITAL_BASED_OUTPATIENT_CLINIC_OR_DEPARTMENT_OTHER)
Admission: RE | Admit: 2018-06-12 | Discharge: 2018-06-12 | Disposition: A | Discharge status: Home / Self Care | Payer: Medicare Other | Source: Ambulatory Visit | Attending: Cardiology | Admitting: Cardiology

## 2018-06-12 ENCOUNTER — Encounter (HOSPITAL_COMMUNITY)
Admission: RE | Admit: 2018-06-12 | Discharge: 2018-06-12 | Disposition: A | Discharge status: Home / Self Care | Payer: Medicare Other | Source: Ambulatory Visit | Attending: Cardiology | Admitting: Cardiology

## 2018-06-12 DIAGNOSIS — I2510 Atherosclerotic heart disease of native coronary artery without angina pectoris: Secondary | ICD-10-CM | POA: Diagnosis not present

## 2018-06-12 MED ORDER — TECHNETIUM TC 99M TETROFOSMIN IV KIT
10.0000 | PACK | Freq: Once | INTRAVENOUS | Status: AC | PRN
  Administered 2018-06-12: 33 via INTRAVENOUS

## 2018-06-12 MED ORDER — SODIUM CHLORIDE 0.9% FLUSH
INTRAVENOUS | Status: AC
  Administered 2018-06-12: 10 mL via INTRAVENOUS
  Filled 2018-06-12: qty 10

## 2018-06-12 MED ORDER — REGADENOSON 0.4 MG/5ML IV SOLN
INTRAVENOUS | Status: AC
  Administered 2018-06-12: 0.4 mg via INTRAVENOUS
  Filled 2018-06-12: qty 5

## 2018-06-13 ENCOUNTER — Encounter (HOSPITAL_COMMUNITY)
Admission: RE | Admit: 2018-06-13 | Discharge: 2018-06-13 | Disposition: A | Discharge status: Home / Self Care | Payer: Medicare Other | Source: Ambulatory Visit | Attending: Cardiology | Admitting: Cardiology

## 2018-06-13 LAB — NM MYOCAR MULTI W/SPECT W/WALL MOTION / EF
CHL CUP NUCLEAR SDS: 6
CHL CUP NUCLEAR SRS: 5
CHL CUP NUCLEAR SSS: 11
LV dias vol: 100 mL (ref 46–106)
LVSYSVOL: 44 mL
Peak HR: 63 {beats}/min
RATE: 0.25
Rest HR: 48 {beats}/min
TID: 1.13

## 2018-06-13 MED ORDER — TECHNETIUM TC 99M TETROFOSMIN IV KIT
25.0000 | PACK | Freq: Once | INTRAVENOUS | Status: AC | PRN
Start: 2018-06-13 — End: 2018-06-13
  Administered 2018-06-13: 24.9 via INTRAVENOUS

## 2018-06-17 ENCOUNTER — Other Ambulatory Visit: Payer: Self-pay

## 2018-06-17 MED ORDER — ATENOLOL 25 MG PO TABS: 25.0000 mg | ORAL_TABLET | Freq: Every day | ORAL | 3 refills | Status: DC

## 2018-06-17 NOTE — Telephone Encounter (Signed)
Changed dose for Atenolol to 25 mg daily

## 2018-06-27 DIAGNOSIS — H25813 Combined forms of age-related cataract, bilateral: Secondary | ICD-10-CM | POA: Diagnosis not present

## 2018-06-27 DIAGNOSIS — E103493 Type 1 diabetes mellitus with severe nonproliferative diabetic retinopathy without macular edema, bilateral: Secondary | ICD-10-CM | POA: Diagnosis not present

## 2018-07-05 ENCOUNTER — Other Ambulatory Visit (HOSPITAL_COMMUNITY): Payer: Self-pay | Admitting: Family Medicine

## 2018-07-05 DIAGNOSIS — Z1231 Encounter for screening mammogram for malignant neoplasm of breast: Secondary | ICD-10-CM

## 2018-07-06 DIAGNOSIS — J069 Acute upper respiratory infection, unspecified: Secondary | ICD-10-CM | POA: Diagnosis not present

## 2018-07-06 DIAGNOSIS — R05 Cough: Secondary | ICD-10-CM | POA: Diagnosis not present

## 2018-07-06 DIAGNOSIS — H6501 Acute serous otitis media, right ear: Secondary | ICD-10-CM | POA: Diagnosis not present

## 2018-07-08 NOTE — Patient Instructions (Signed)
Your procedure is scheduled on: 07/19/2018  Report to [facility] at  900   AM.  Call this number if you have problems the morning of surgery: [phone]   Do not eat food or drink liquids :After Midnight.      Take these medicines the morning of surgery with A SIP OF WATER: atenolol, lisinopril, mobic ( if needed), verapamil. Use your inhaler before you come.   Do not wear jewelry, make-up or nail polish.  Do not wear lotions, powders, or perfumes. You may wear deodorant.  Do not shave 48 hours prior to surgery.  Do not bring valuables to the hospital.  Contacts, dentures or bridgework may not be worn into surgery.  Leave suitcase in the car. After surgery it may be brought to your room.  For patients admitted to the hospital, checkout time is 11:00 AM the day of discharge.   Patients discharged the day of surgery will not be allowed to drive home.  :     Please read over the following fact sheets that you were given: Coughing and Deep Breathing, Surgical Site Infection Prevention, Anesthesia Post-op Instructions and Care and Recovery After Surgery    Cataract A cataract is a clouding of the lens of the eye. When a lens becomes cloudy, vision is reduced based on the degree and nature of the clouding. Many cataracts reduce vision to some degree. Some cataracts make people more near-sighted as they develop. Other cataracts increase glare. Cataracts that are ignored and become worse can sometimes look white. The white color can be seen through the pupil. CAUSES   Aging. However, cataracts may occur at any age, even in newborns.   Certain drugs.   Trauma to the eye.   Certain diseases such as diabetes.   Specific eye diseases such as chronic inflammation inside the eye or a sudden attack of a rare form of glaucoma.   Inherited or acquired medical problems.  SYMPTOMS   Gradual, progressive drop in vision in the affected eye.   Severe, rapid visual loss. This most often happens when  trauma is the cause.  DIAGNOSIS  To detect a cataract, an eye doctor examines the lens. Cataracts are best diagnosed with an exam of the eyes with the pupils enlarged (dilated) by drops.  TREATMENT  For an early cataract, vision may improve by using different eyeglasses or stronger lighting. If that does not help your vision, surgery is the only effective treatment. A cataract needs to be surgically removed when vision loss interferes with your everyday activities, such as driving, reading, or watching TV. A cataract may also have to be removed if it prevents examination or treatment of another eye problem. Surgery removes the cloudy lens and usually replaces it with a substitute lens (intraocular lens, IOL).  At a time when both you and your doctor agree, the cataract will be surgically removed. If you have cataracts in both eyes, only one is usually removed at a time. This allows the operated eye to heal and be out of danger from any possible problems after surgery (such as infection or poor wound healing). In rare cases, a cataract may be doing damage to your eye. In these cases, your caregiver may advise surgical removal right away. The vast majority of people who have cataract surgery have better vision afterward. HOME CARE INSTRUCTIONS  If you are not planning surgery, you may be asked to do the following:  Use different eyeglasses.   Use stronger or brighter  lighting.   Ask your eye doctor about reducing your medicine dose or changing medicines if it is thought that a medicine caused your cataract. Changing medicines does not make the cataract go away on its own.   Become familiar with your surroundings. Poor vision can lead to injury. Avoid bumping into things on the affected side. You are at a higher risk for tripping or falling.   Exercise extreme care when driving or operating machinery.   Wear sunglasses if you are sensitive to bright light or experiencing problems with glare.  SEEK  IMMEDIATE MEDICAL CARE IF:   You have a worsening or sudden vision loss.   You notice redness, swelling, or increasing pain in the eye.   You have a fever.  Document Released: 09/04/2005 Document Revised: 08/24/2011 Document Reviewed: 04/28/2011 [facility] Patient Information 2012 [place].PATIENT INSTRUCTIONS POST-ANESTHESIA  IMMEDIATELY FOLLOWING SURGERY:  Do not drive or operate machinery for the first twenty four hours after surgery.  Do not make any important decisions for twenty four hours after surgery or while taking narcotic pain medications or sedatives.  If you develop intractable nausea and vomiting or a severe headache please notify your doctor immediately.  FOLLOW-UP:  Please make an appointment with your surgeon as instructed. You do not need to follow up with anesthesia unless specifically instructed to do so.  WOUND CARE INSTRUCTIONS (if applicable):  Keep a dry clean dressing on the anesthesia/puncture wound site if there is drainage.  Once the wound has quit draining you may leave it open to air.  Generally you should leave the bandage intact for twenty four hours unless there is drainage.  If the epidural site drains for more than 36-48 hours please call the anesthesia department.  QUESTIONS?:  Please feel free to call your physician or the hospital operator if you have any questions, and they will be happy to assist you.

## 2018-07-10 ENCOUNTER — Other Ambulatory Visit: Payer: Self-pay

## 2018-07-10 NOTE — Patient Outreach (Signed)
[facility]) Care Management  07/10/2018  [name] 05-31-1951 [id number]   Medication Adherence call to Mrs. [name] left a message for patirnt to call back patient is due on Rosuvastatin 5 mg. [name] is showing past due under [place].   [facility] Management Direct Dial [phone]  Fax [phone] [name]

## 2018-07-11 DIAGNOSIS — H25812 Combined forms of age-related cataract, left eye: Secondary | ICD-10-CM | POA: Diagnosis not present

## 2018-07-15 ENCOUNTER — Other Ambulatory Visit: Payer: Self-pay

## 2018-07-15 ENCOUNTER — Encounter (HOSPITAL_COMMUNITY)
Admission: RE | Admit: 2018-07-15 | Discharge: 2018-07-15 | Disposition: A | Discharge status: Home / Self Care | Payer: Medicare Other | Source: Ambulatory Visit | Attending: Ophthalmology | Admitting: Ophthalmology

## 2018-07-15 ENCOUNTER — Encounter (HOSPITAL_COMMUNITY): Payer: Self-pay

## 2018-07-15 DIAGNOSIS — Z01812 Encounter for preprocedural laboratory examination: Secondary | ICD-10-CM | POA: Diagnosis not present

## 2018-07-15 DIAGNOSIS — I10 Essential (primary) hypertension: Secondary | ICD-10-CM | POA: Diagnosis not present

## 2018-07-15 DIAGNOSIS — E118 Type 2 diabetes mellitus with unspecified complications: Secondary | ICD-10-CM | POA: Diagnosis not present

## 2018-07-15 DIAGNOSIS — I2510 Atherosclerotic heart disease of native coronary artery without angina pectoris: Secondary | ICD-10-CM | POA: Diagnosis not present

## 2018-07-15 DIAGNOSIS — Z794 Long term (current) use of insulin: Secondary | ICD-10-CM | POA: Diagnosis not present

## 2018-07-15 DIAGNOSIS — M159 Polyosteoarthritis, unspecified: Secondary | ICD-10-CM | POA: Diagnosis not present

## 2018-07-15 HISTORY — DX: Pure hypercholesterolemia, unspecified: E78.00

## 2018-07-15 LAB — CBC WITH DIFFERENTIAL/PLATELET
Abs Immature Granulocytes: 0.04 10*3/uL (ref 0.00–0.07)
BASOS ABS: 0 10*3/uL (ref 0.0–0.1)
Basophils Relative: 0 %
EOS ABS: 0.2 10*3/uL (ref 0.0–0.5)
Eosinophils Relative: 2 %
HEMATOCRIT: 40 % (ref 36.0–46.0)
HEMOGLOBIN: 12.6 g/dL (ref 12.0–15.0)
IMMATURE GRANULOCYTES: 0 %
LYMPHS ABS: 2.6 10*3/uL (ref 0.7–4.0)
LYMPHS PCT: 26 %
MCH: 29.7 pg (ref 26.0–34.0)
MCHC: 31.5 g/dL (ref 30.0–36.0)
MCV: 94.3 fL (ref 80.0–100.0)
MONOS PCT: 8 %
Monocytes Absolute: 0.8 10*3/uL (ref 0.1–1.0)
NEUTROS ABS: 6.3 10*3/uL (ref 1.7–7.7)
NEUTROS PCT: 64 %
NRBC: 0 % (ref 0.0–0.2)
Platelets: 303 10*3/uL (ref 150–400)
RBC: 4.24 MIL/uL (ref 3.87–5.11)
RDW: 13.3 % (ref 11.5–15.5)
WBC: 10 10*3/uL (ref 4.0–10.5)

## 2018-07-15 LAB — HEMOGLOBIN A1C
Hgb A1c MFr Bld: 8.7 % — ABNORMAL HIGH (ref 4.8–5.6)
MEAN PLASMA GLUCOSE: 202.99 mg/dL

## 2018-07-15 LAB — BASIC METABOLIC PANEL
ANION GAP: 11 (ref 5–15)
BUN: 33 mg/dL — ABNORMAL HIGH (ref 8–23)
CALCIUM: 8.8 mg/dL — AB (ref 8.9–10.3)
CO2: 23 mmol/L (ref 22–32)
Chloride: 98 mmol/L (ref 98–111)
Creatinine, Ser: 1.44 mg/dL — ABNORMAL HIGH (ref 0.44–1.00)
GFR calc non Af Amer: 36 mL/min — ABNORMAL LOW (ref >60)
GFR, EST AFRICAN AMERICAN: 42 mL/min — AB (ref >60)
GLUCOSE: 314 mg/dL — AB (ref 70–99)
Potassium: 4.2 mmol/L (ref 3.5–5.1)
Sodium: 132 mmol/L — ABNORMAL LOW (ref 135–145)

## 2018-07-15 LAB — GLUCOSE, CAPILLARY: Glucose-Capillary: 310 mg/dL — ABNORMAL HIGH (ref 70–99)

## 2018-07-16 NOTE — Pre-Procedure Instructions (Signed)
HgbA1C routed to PCP. 

## 2018-07-19 ENCOUNTER — Ambulatory Visit (HOSPITAL_COMMUNITY): Payer: Medicare Other | Admitting: Anesthesiology

## 2018-07-19 ENCOUNTER — Encounter (HOSPITAL_COMMUNITY): Payer: Self-pay

## 2018-07-19 ENCOUNTER — Encounter (HOSPITAL_COMMUNITY)
Admission: RE | Disposition: A | Discharge status: Home / Self Care | Payer: Self-pay | Source: Ambulatory Visit | Attending: Ophthalmology

## 2018-07-19 ENCOUNTER — Ambulatory Visit (HOSPITAL_COMMUNITY)
Admission: RE | Admit: 2018-07-19 | Discharge: 2018-07-19 | Disposition: A | Discharge status: Home / Self Care | Payer: Medicare Other | Source: Ambulatory Visit | Attending: Ophthalmology | Admitting: Ophthalmology

## 2018-07-19 DIAGNOSIS — J449 Chronic obstructive pulmonary disease, unspecified: Secondary | ICD-10-CM | POA: Insufficient documentation

## 2018-07-19 DIAGNOSIS — K219 Gastro-esophageal reflux disease without esophagitis: Secondary | ICD-10-CM | POA: Insufficient documentation

## 2018-07-19 DIAGNOSIS — Z794 Long term (current) use of insulin: Secondary | ICD-10-CM | POA: Insufficient documentation

## 2018-07-19 DIAGNOSIS — E7800 Pure hypercholesterolemia, unspecified: Secondary | ICD-10-CM | POA: Diagnosis not present

## 2018-07-19 DIAGNOSIS — I10 Essential (primary) hypertension: Secondary | ICD-10-CM | POA: Insufficient documentation

## 2018-07-19 DIAGNOSIS — E1136 Type 2 diabetes mellitus with diabetic cataract: Secondary | ICD-10-CM | POA: Insufficient documentation

## 2018-07-19 DIAGNOSIS — M1990 Unspecified osteoarthritis, unspecified site: Secondary | ICD-10-CM | POA: Insufficient documentation

## 2018-07-19 DIAGNOSIS — H25812 Combined forms of age-related cataract, left eye: Secondary | ICD-10-CM | POA: Diagnosis not present

## 2018-07-19 DIAGNOSIS — Z87891 Personal history of nicotine dependence: Secondary | ICD-10-CM | POA: Diagnosis not present

## 2018-07-19 DIAGNOSIS — H2512 Age-related nuclear cataract, left eye: Secondary | ICD-10-CM | POA: Diagnosis not present

## 2018-07-19 HISTORY — PX: CATARACT EXTRACTION W/PHACO: SHX586

## 2018-07-19 LAB — GLUCOSE, CAPILLARY: GLUCOSE-CAPILLARY: 178 mg/dL — AB (ref 70–99)

## 2018-07-19 SURGERY — PHACOEMULSIFICATION, CATARACT, WITH IOL INSERTION
Anesthesia: Monitor Anesthesia Care | Site: Eye | Laterality: Left

## 2018-07-19 MED ORDER — LIDOCAINE 3.5 % OP GEL OPTIME - NO CHARGE
OPHTHALMIC | Status: DC | PRN
  Administered 2018-07-19: 1 [drp] via OPHTHALMIC

## 2018-07-19 MED ORDER — EPINEPHRINE PF 1 MG/ML IJ SOLN
INTRAMUSCULAR | Status: AC
  Filled 2018-07-19: qty 2

## 2018-07-19 MED ORDER — NEOMYCIN-POLYMYXIN-DEXAMETH 3.5-10000-0.1 OP SUSP
OPHTHALMIC | Status: DC | PRN
  Administered 2018-07-19: 1 [drp] via OPHTHALMIC

## 2018-07-19 MED ORDER — LIDOCAINE HCL 3.5 % OP GEL
1.0000 "application " | Freq: Once | OPHTHALMIC | Status: AC
  Administered 2018-07-19: 1 via OPHTHALMIC

## 2018-07-19 MED ORDER — PROVISC 10 MG/ML IO SOLN
INTRAOCULAR | Status: DC | PRN
  Administered 2018-07-19: 0.85 mL via INTRAOCULAR

## 2018-07-19 MED ORDER — LIDOCAINE HCL (PF) 1 % IJ SOLN
INTRAOCULAR | Status: DC | PRN
  Administered 2018-07-19: 1 mL via OPHTHALMIC

## 2018-07-19 MED ORDER — PHENYLEPHRINE HCL 2.5 % OP SOLN
1.0000 [drp] | OPHTHALMIC | Status: AC
  Administered 2018-07-19 (×3): 1 [drp] via OPHTHALMIC

## 2018-07-19 MED ORDER — CYCLOPENTOLATE-PHENYLEPHRINE 0.2-1 % OP SOLN
1.0000 [drp] | OPHTHALMIC | Status: AC
  Administered 2018-07-19 (×3): 1 [drp] via OPHTHALMIC

## 2018-07-19 MED ORDER — LACTATED RINGERS IV SOLN
INTRAVENOUS | Status: DC
  Administered 2018-07-19: 10:00:00 via INTRAVENOUS

## 2018-07-19 MED ORDER — SODIUM HYALURONATE 23 MG/ML IO SOLN
INTRAOCULAR | Status: DC | PRN
  Administered 2018-07-19: 0.6 mL via INTRAOCULAR

## 2018-07-19 MED ORDER — POVIDONE-IODINE 5 % OP SOLN
OPHTHALMIC | Status: DC | PRN
  Administered 2018-07-19 (×2): 1 via OPHTHALMIC

## 2018-07-19 MED ORDER — EPINEPHRINE PF 1 MG/ML IJ SOLN
INTRAOCULAR | Status: DC | PRN
  Administered 2018-07-19: 500 mL

## 2018-07-19 MED ORDER — BSS IO SOLN
INTRAOCULAR | Status: DC | PRN
  Administered 2018-07-19: 15 mL via INTRAOCULAR

## 2018-07-19 MED ORDER — TETRACAINE HCL 0.5 % OP SOLN
1.0000 [drp] | OPHTHALMIC | Status: AC
  Administered 2018-07-19 (×3): 1 [drp] via OPHTHALMIC

## 2018-07-19 SURGICAL SUPPLY — 16 items
CLOTH BEACON ORANGE TIMEOUT ST (SAFETY) ×1 IMPLANT
EYE SHIELD UNIVERSAL CLEAR (GAUZE/BANDAGES/DRESSINGS) ×1 IMPLANT
GLOVE BIOGEL PI IND STRL 7.0 (GLOVE) IMPLANT
GLOVE BIOGEL PI IND STRL 7.5 (GLOVE) IMPLANT
GLOVE BIOGEL PI INDICATOR 7.0 (GLOVE) ×2
GLOVE BIOGEL PI INDICATOR 7.5 (GLOVE) ×1
LENS ALC ACRYL/TECN (Ophthalmic Related) ×1 IMPLANT
NDL HYPO 18GX1.5 BLUNT FILL (NEEDLE) IMPLANT
NEEDLE HYPO 18GX1.5 BLUNT FILL (NEEDLE) ×2 IMPLANT
PAD ARMBOARD 7.5X6 YLW CONV (MISCELLANEOUS) ×1 IMPLANT
RING MALYGIN (MISCELLANEOUS) IMPLANT
SYR TB 1ML LL NO SAFETY (SYRINGE) ×1 IMPLANT
TAPE SURG TRANSPORE 1 IN (GAUZE/BANDAGES/DRESSINGS) IMPLANT
TAPE SURGICAL TRANSPORE 1 IN (GAUZE/BANDAGES/DRESSINGS) ×1
VISCOELASTIC ADDITIONAL (OPHTHALMIC RELATED) ×1 IMPLANT
WATER STERILE IRR 250ML POUR (IV SOLUTION) ×1 IMPLANT

## 2018-07-19 NOTE — H&P (Signed)
The H and P was reviewed and updated. The patient was examined.  No changes were found after exam.  The surgical eye was marked.  

## 2018-07-19 NOTE — Discharge Instructions (Signed)
Please discharge patient when stable, will follow up today with Dr. Sheilyn Boehlke at the [facility] office immediately following discharge.  Leave shield in place until visit.  All paperwork with discharge instructions will be given at the office. ° ° °Monitored Anesthesia Care, Care After °These instructions provide you with information about caring for yourself after your procedure. Your health care provider may also give you more specific instructions. Your treatment has been planned according to current medical practices, but problems sometimes occur. Call your health care provider if you have any problems or questions after your procedure. °What can I expect after the procedure? °After your procedure, it is common to: °· Feel sleepy for several hours. °· Feel clumsy and have poor balance for several hours. °· Feel forgetful about what happened after the procedure. °· Have poor judgment for several hours. °· Feel nauseous or vomit. °· Have a sore throat if you had a breathing tube during the procedure. ° °Follow these instructions at home: °For at least 24 hours after the procedure: ° °· Do not: °? Participate in activities in which you could fall or become injured. °? Drive. °? Use heavy machinery. °? Drink alcohol. °? Take sleeping pills or medicines that cause drowsiness. °? Make important decisions or sign legal documents. °? Take care of children on your own. °· Rest. °Eating and drinking °· Follow the diet that is recommended by your health care provider. °· If you vomit, drink water, juice, or soup when you can drink without vomiting. °· Make sure you have little or no nausea before eating solid foods. °General instructions °· Have a responsible adult stay with you until you are awake and alert. °· Take over-the-counter and prescription medicines only as told by your health care provider. °· If you smoke, do not smoke without supervision. °· Keep all follow-up visits as told by your health care  provider. This is important. °Contact a health care provider if: °· You keep feeling nauseous or you keep vomiting. °· You feel light-headed. °· You develop a rash. °· You have a fever. °Get help right away if: °· You have trouble breathing. °This information is not intended to replace advice given to you by your health care provider. Make sure you discuss any questions you have with your health care provider. °Document Released: 12/26/2015 Document Revised: 04/26/2016 Document Reviewed: 12/26/2015 °Elsevier Interactive Patient Education © 2018 [employer] ° °

## 2018-07-19 NOTE — Op Note (Signed)
Date of procedure: 07/19/18  Pre-operative diagnosis: Visually significant cataract, Left Eye (H25.812)  Post-operative diagnosis: Visually significant cataract, Left Eye  Procedure: Removal of cataract via phacoemulsification and insertion of intra-ocular lens [employer] Vision PCB00  +20.5D into the capsular bag of the Left Eye  Attending surgeon: Gerda Diss. Dudley Cooley, MD, MA  Anesthesia: MAC, Topical Akten  Complications: None  Estimated Blood Loss: <21m (minimal)  Specimens: None  Implants: As above  Indications:  Visually significant cataract, Left Eye  Procedure:  The patient was seen and identified in the pre-operative area. The operative eye was identified and dilated.  The operative eye was marked.  Topical anesthesia was administered to the operative eye.     The patient was then to the operative suite and placed in the supine position.  A timeout was performed confirming the patient, procedure to be performed, and all other relevant information.   The patient's face was prepped and draped in the usual fashion for intra-ocular surgery.  A lid speculum was placed into the operative eye and the surgical microscope moved into place and focused.  An inferotemporal paracentesis was created using a 20 gauge paracentesis blade.  Shugarcaine was injected into the anterior chamber.  Viscoelastic was injected into the anterior chamber.  A temporal clear-corneal main wound incision was created using a 2.459mmicrokeratome.  A continuous curvilinear capsulorrhexis was initiated using an irrigating cystitome and completed using capsulorrhexis forceps.  Hydrodissection and hydrodeliniation were performed.  Viscoelastic was injected into the anterior chamber.  A phacoemulsification handpiece and a chopper as a second instrument were used to remove the nucleus and epinucleus. The irrigation/aspiration handpiece was used to remove any remaining cortical material.   The capsular bag was  reinflated with viscoelastic, checked, and found to be intact.  The intraocular lens was inserted into the capsular bag and dialed into place using a Kuglen hook.  The irrigation/aspiration handpiece was used to remove any remaining viscoelastic.  The clear corneal wound and paracentesis wounds were then hydrated and checked with Weck-Cels to be watertight.  An air bubble was placed in the anterior chamber.  The lid-speculum and drape was removed, and the patient's face was cleaned with a wet and dry 4x4.  Maxitrol was instilled in the eye before a clear shield was taped over the eye. The patient was taken to the post-operative care unit in good condition, having tolerated the procedure well.  Post-Op Instructions: [facility] post-operative evaluation and will receive all other orders and instructions.

## 2018-07-19 NOTE — Anesthesia Preprocedure Evaluation (Signed)
Anesthesia Evaluation  Patient identified by MRN, date of birth, ID band Patient awake    Reviewed: Allergy & Precautions, NPO status , Patient's Chart, lab work & pertinent test results, reviewed documented beta blocker date and time   Airway Mallampati: II  TM Distance: >3 FB Neck ROM: Full    Dental no notable dental hx. (+) Lower Dentures, Upper Dentures   Pulmonary COPD,  COPD inhaler, former smoker,    Pulmonary exam normal breath sounds clear to auscultation       Cardiovascular Exercise Tolerance: Good hypertension, Pt. on medications negative cardio ROS Normal cardiovascular examII Rhythm:Regular Rate:Normal     Neuro/Psych  Neuromuscular disease negative psych ROS   GI/Hepatic Neg liver ROS, GERD  Medicated and Controlled,  Endo/Other  diabetes, Well Controlled, Type 1, Insulin DependentMorbid obesity  Renal/GU negative Renal ROS  negative genitourinary   Musculoskeletal  (+) Arthritis , Osteoarthritis,    Abdominal   Peds negative pediatric ROS (+)  Hematology negative hematology ROS (+) anemia ,   Anesthesia Other Findings   Reproductive/Obstetrics negative OB ROS                             Anesthesia Physical Anesthesia Plan  ASA: III  Anesthesia Plan: MAC   Post-op Pain Management:    Induction: Intravenous  PONV Risk Score and Plan:   Airway Management Planned: Nasal Cannula and Simple Face Mask  Additional Equipment:   Intra-op Plan:   Post-operative Plan:   Informed Consent: I have reviewed the patients History and Physical, chart, labs and discussed the procedure including the risks, benefits and alternatives for the proposed anesthesia with the patient or authorized representative who has indicated his/her understanding and acceptance.   Dental advisory given  Plan Discussed with: CRNA  Anesthesia Plan Comments:         Anesthesia Quick  Evaluation

## 2018-07-19 NOTE — Transfer of Care (Signed)
Immediate Anesthesia Transfer of Care Note  Patient: [name]  Procedure(s) Performed: CATARACT EXTRACTION PHACO AND INTRAOCULAR LENS PLACEMENT (IOC) LEFT CDE:3.26 (Left Eye)  Patient Location: Short Stay  Anesthesia Type:MAC  Level of Consciousness: awake  Airway & Oxygen Therapy: Patient Spontanous Breathing  Post-op Assessment: Report given to RN  Post vital signs: Reviewed  Last Vitals:  Vitals Value Taken Time  BP    Temp    Pulse    Resp    SpO2      Last Pain:  Vitals:   07/19/18 0910  TempSrc: Oral  PainSc: 0-No pain         Complications: No apparent anesthesia complications

## 2018-07-19 NOTE — Anesthesia Postprocedure Evaluation (Signed)
Anesthesia Post Note  Patient: [name]  Procedure(s) Performed: CATARACT EXTRACTION PHACO AND INTRAOCULAR LENS PLACEMENT (IOC) LEFT CDE:3.26 (Left Eye)  Patient location during evaluation: Short Stay Anesthesia Type: MAC Level of consciousness: awake and alert and oriented Pain management: pain level controlled Vital Signs Assessment: post-procedure vital signs reviewed and stable Respiratory status: spontaneous breathing Cardiovascular status: blood pressure returned to baseline and stable Postop Assessment: no apparent nausea or vomiting Anesthetic complications: no     Last Vitals:  Vitals:   07/19/18 0910 07/19/18 1010  BP: (!) 167/73 (!) 146/60  Pulse: (!) 56 (!) 55  Resp: 18 18  Temp: 36.9 C   SpO2: 97% 99%    Last Pain:  Vitals:   07/19/18 0910  TempSrc: Oral  PainSc: 0-No pain                 Daysean Tinkham

## 2018-07-22 ENCOUNTER — Encounter (HOSPITAL_COMMUNITY): Payer: Self-pay | Admitting: Ophthalmology

## 2018-07-30 DIAGNOSIS — E785 Hyperlipidemia, unspecified: Secondary | ICD-10-CM | POA: Diagnosis not present

## 2018-07-30 DIAGNOSIS — E1165 Type 2 diabetes mellitus with hyperglycemia: Secondary | ICD-10-CM | POA: Diagnosis not present

## 2018-07-30 DIAGNOSIS — Z720 Tobacco use: Secondary | ICD-10-CM | POA: Diagnosis not present

## 2018-07-30 DIAGNOSIS — I10 Essential (primary) hypertension: Secondary | ICD-10-CM | POA: Diagnosis not present

## 2018-07-30 DIAGNOSIS — I129 Hypertensive chronic kidney disease with stage 1 through stage 4 chronic kidney disease, or unspecified chronic kidney disease: Secondary | ICD-10-CM | POA: Diagnosis not present

## 2018-07-30 DIAGNOSIS — G5603 Carpal tunnel syndrome, bilateral upper limbs: Secondary | ICD-10-CM | POA: Diagnosis not present

## 2018-08-13 ENCOUNTER — Encounter (HOSPITAL_COMMUNITY): Payer: Self-pay

## 2018-08-14 ENCOUNTER — Encounter (HOSPITAL_COMMUNITY)
Admission: RE | Admit: 2018-08-14 | Discharge: 2018-08-14 | Disposition: A | Discharge status: Home / Self Care | Payer: Medicare Other | Source: Ambulatory Visit | Attending: Ophthalmology | Admitting: Ophthalmology

## 2018-08-14 DIAGNOSIS — H25811 Combined forms of age-related cataract, right eye: Secondary | ICD-10-CM | POA: Diagnosis not present

## 2018-08-20 DIAGNOSIS — E119 Type 2 diabetes mellitus without complications: Secondary | ICD-10-CM | POA: Diagnosis not present

## 2018-08-20 DIAGNOSIS — I10 Essential (primary) hypertension: Secondary | ICD-10-CM | POA: Diagnosis not present

## 2018-08-22 MED ORDER — PROMETHAZINE HCL 25 MG/ML IJ SOLN: 6.2500 mg | INTRAMUSCULAR | Status: DC | PRN

## 2018-08-22 MED ORDER — HYDROMORPHONE HCL 1 MG/ML IJ SOLN: 0.2500 mg | INTRAMUSCULAR | Status: DC | PRN

## 2018-08-22 MED ORDER — MIDAZOLAM HCL 2 MG/2ML IJ SOLN: 0.5000 mg | Freq: Once | INTRAMUSCULAR | Status: DC | PRN

## 2018-08-22 MED ORDER — HYDROCODONE-ACETAMINOPHEN 7.5-325 MG PO TABS: 1.0000 | ORAL_TABLET | Freq: Once | ORAL | Status: DC | PRN

## 2018-08-23 ENCOUNTER — Encounter (HOSPITAL_COMMUNITY)
Admission: RE | Disposition: A | Discharge status: Home / Self Care | Payer: Self-pay | Source: Ambulatory Visit | Attending: Ophthalmology

## 2018-08-23 ENCOUNTER — Encounter (HOSPITAL_COMMUNITY): Payer: Self-pay | Admitting: *Deleted

## 2018-08-23 ENCOUNTER — Ambulatory Visit (HOSPITAL_COMMUNITY)
Admission: RE | Admit: 2018-08-23 | Discharge: 2018-08-23 | Disposition: A | Discharge status: Home / Self Care | Payer: Medicare Other | Source: Ambulatory Visit | Attending: Ophthalmology | Admitting: Ophthalmology

## 2018-08-23 ENCOUNTER — Ambulatory Visit (HOSPITAL_COMMUNITY): Payer: Medicare Other | Admitting: Anesthesiology

## 2018-08-23 DIAGNOSIS — E1136 Type 2 diabetes mellitus with diabetic cataract: Secondary | ICD-10-CM | POA: Diagnosis not present

## 2018-08-23 DIAGNOSIS — Z6841 Body Mass Index (BMI) 40.0 and over, adult: Secondary | ICD-10-CM | POA: Insufficient documentation

## 2018-08-23 DIAGNOSIS — H2511 Age-related nuclear cataract, right eye: Secondary | ICD-10-CM | POA: Diagnosis not present

## 2018-08-23 DIAGNOSIS — Z87891 Personal history of nicotine dependence: Secondary | ICD-10-CM | POA: Insufficient documentation

## 2018-08-23 DIAGNOSIS — I10 Essential (primary) hypertension: Secondary | ICD-10-CM | POA: Diagnosis not present

## 2018-08-23 DIAGNOSIS — K219 Gastro-esophageal reflux disease without esophagitis: Secondary | ICD-10-CM | POA: Diagnosis not present

## 2018-08-23 DIAGNOSIS — H25811 Combined forms of age-related cataract, right eye: Secondary | ICD-10-CM | POA: Insufficient documentation

## 2018-08-23 DIAGNOSIS — Z79899 Other long term (current) drug therapy: Secondary | ICD-10-CM | POA: Insufficient documentation

## 2018-08-23 DIAGNOSIS — E119 Type 2 diabetes mellitus without complications: Secondary | ICD-10-CM | POA: Diagnosis not present

## 2018-08-23 DIAGNOSIS — J449 Chronic obstructive pulmonary disease, unspecified: Secondary | ICD-10-CM | POA: Diagnosis not present

## 2018-08-23 HISTORY — PX: CATARACT EXTRACTION W/PHACO: SHX586

## 2018-08-23 LAB — GLUCOSE, CAPILLARY: Glucose-Capillary: 182 mg/dL — ABNORMAL HIGH (ref 70–99)

## 2018-08-23 SURGERY — PHACOEMULSIFICATION, CATARACT, WITH IOL INSERTION
Anesthesia: Monitor Anesthesia Care | Site: Eye | Laterality: Right

## 2018-08-23 MED ORDER — SODIUM HYALURONATE 23 MG/ML IO SOLN
INTRAOCULAR | Status: DC | PRN
  Administered 2018-08-23: 0.6 mL via INTRAOCULAR

## 2018-08-23 MED ORDER — TETRACAINE HCL 0.5 % OP SOLN
1.0000 [drp] | OPHTHALMIC | Status: AC
  Administered 2018-08-23 (×3): 1 [drp] via OPHTHALMIC

## 2018-08-23 MED ORDER — EPINEPHRINE PF 1 MG/ML IJ SOLN
INTRAMUSCULAR | Status: AC
  Filled 2018-08-23: qty 2

## 2018-08-23 MED ORDER — MIDAZOLAM HCL 5 MG/5ML IJ SOLN
INTRAMUSCULAR | Status: DC | PRN
  Administered 2018-08-23: 2 mg via INTRAVENOUS

## 2018-08-23 MED ORDER — PROVISC 10 MG/ML IO SOLN
INTRAOCULAR | Status: DC | PRN
  Administered 2018-08-23: 0.85 mL via INTRAOCULAR

## 2018-08-23 MED ORDER — CYCLOPENTOLATE-PHENYLEPHRINE 0.2-1 % OP SOLN
1.0000 [drp] | OPHTHALMIC | Status: AC
  Administered 2018-08-23 (×3): 1 [drp] via OPHTHALMIC

## 2018-08-23 MED ORDER — LIDOCAINE HCL 3.5 % OP GEL
1.0000 "application " | Freq: Once | OPHTHALMIC | Status: AC
  Administered 2018-08-23: 1 via OPHTHALMIC

## 2018-08-23 MED ORDER — PHENYLEPHRINE HCL 2.5 % OP SOLN
1.0000 [drp] | OPHTHALMIC | Status: AC
  Administered 2018-08-23 (×3): 1 [drp] via OPHTHALMIC

## 2018-08-23 MED ORDER — LACTATED RINGERS IV SOLN
INTRAVENOUS | Status: DC
  Administered 2018-08-23: 08:00:00 via INTRAVENOUS

## 2018-08-23 MED ORDER — MIDAZOLAM HCL 2 MG/2ML IJ SOLN
INTRAMUSCULAR | Status: AC
  Filled 2018-08-23: qty 2

## 2018-08-23 MED ORDER — NEOMYCIN-POLYMYXIN-DEXAMETH 3.5-10000-0.1 OP SUSP
OPHTHALMIC | Status: DC | PRN
  Administered 2018-08-23: 2 [drp] via OPHTHALMIC

## 2018-08-23 MED ORDER — EPINEPHRINE PF 1 MG/ML IJ SOLN
INTRAOCULAR | Status: DC | PRN
  Administered 2018-08-23: 500 mL

## 2018-08-23 MED ORDER — LIDOCAINE HCL (PF) 1 % IJ SOLN
INTRAOCULAR | Status: DC | PRN
  Administered 2018-08-23: 1 mL via OPHTHALMIC

## 2018-08-23 MED ORDER — GLYCOPYRROLATE 0.2 MG/ML IJ SOLN
INTRAMUSCULAR | Status: DC | PRN
  Administered 2018-08-23: 0.2 mg via INTRAVENOUS

## 2018-08-23 MED ORDER — BSS IO SOLN
INTRAOCULAR | Status: DC | PRN
  Administered 2018-08-23: 15 mL

## 2018-08-23 MED ORDER — POVIDONE-IODINE 5 % OP SOLN
OPHTHALMIC | Status: DC | PRN
  Administered 2018-08-23: 1 via OPHTHALMIC

## 2018-08-23 SURGICAL SUPPLY — 12 items
CLOTH BEACON ORANGE TIMEOUT ST (SAFETY) ×2 IMPLANT
EYE SHIELD UNIVERSAL CLEAR (GAUZE/BANDAGES/DRESSINGS) ×2 IMPLANT
GLOVE BIOGEL PI IND STRL 7.0 (GLOVE) ×2 IMPLANT
GLOVE BIOGEL PI INDICATOR 7.0 (GLOVE) ×2
LENS ALC ACRYL/TECN (Ophthalmic Related) ×2 IMPLANT
NEEDLE HYPO 18GX1.5 BLUNT FILL (NEEDLE) ×2 IMPLANT
PAD ARMBOARD 7.5X6 YLW CONV (MISCELLANEOUS) ×2 IMPLANT
SYR TB 1ML LL NO SAFETY (SYRINGE) ×2 IMPLANT
TAPE SURG TRANSPORE 1 IN (GAUZE/BANDAGES/DRESSINGS) ×1 IMPLANT
TAPE SURGICAL TRANSPORE 1 IN (GAUZE/BANDAGES/DRESSINGS) ×1
VISCOELASTIC ADDITIONAL (OPHTHALMIC RELATED) ×2 IMPLANT
WATER STERILE IRR 250ML POUR (IV SOLUTION) ×2 IMPLANT

## 2018-08-23 NOTE — Transfer of Care (Signed)
Immediate Anesthesia Transfer of Care Note  Patient: [name]  Procedure(s) Performed: CATARACT EXTRACTION PHACO AND INTRAOCULAR LENS PLACEMENT RIGHT EYE (Right Eye)  Patient Location: Short Stay  Anesthesia Type:MAC  Level of Consciousness: awake, alert , oriented and patient cooperative  Airway & Oxygen Therapy: Patient Spontanous Breathing  Post-op Assessment: Report given to RN and Post -op Vital signs reviewed and stable  Post vital signs: Reviewed and stable  Last Vitals:  Vitals Value Taken Time  BP    Temp    Pulse    Resp    SpO2      Last Pain:  Vitals:   08/23/18 0724  TempSrc: Oral  PainSc: 0-No pain      Patients Stated Pain Goal: 4 (13/08/65 7846)  Complications: No apparent anesthesia complications

## 2018-08-23 NOTE — Anesthesia Procedure Notes (Signed)
Procedure Name: [place] Performed by: Andree Elk Billye Nydam A, CRNA Pre-anesthesia Checklist: Patient identified, Emergency Drugs available, Suction available, Timeout performed and Patient being monitored Patient Re-evaluated:Patient Re-evaluated prior to induction Oxygen Delivery Method: Nasal Cannula

## 2018-08-23 NOTE — Discharge Instructions (Addendum)
Please discharge patient when stable, will follow up today with Dr. Wrzosek at the [facility] office immediately following discharge.  Leave shield in place until visit.  All paperwork with discharge instructions will be given at the office. ° ° °PATIENT INSTRUCTIONS °POST-ANESTHESIA ° °IMMEDIATELY FOLLOWING SURGERY:  Do not drive or operate machinery for the first twenty four hours after surgery.  Do not make any important decisions for twenty four hours after surgery or while taking narcotic pain medications or sedatives.  If you develop intractable nausea and vomiting or a severe headache please notify your doctor immediately. ° °FOLLOW-UP:  Please make an appointment with your surgeon as instructed. You do not need to follow up with anesthesia unless specifically instructed to do so. ° °WOUND CARE INSTRUCTIONS (if applicable):  Keep a dry clean dressing on the anesthesia/puncture wound site if there is drainage.  Once the wound has quit draining you may leave it open to air.  Generally you should leave the bandage intact for twenty four hours unless there is drainage.  If the epidural site drains for more than 36-48 hours please call the anesthesia department. ° °QUESTIONS?:  Please feel free to call your physician or the hospital operator if you have any questions, and they will be happy to assist you.    ° ° ° °

## 2018-08-23 NOTE — Op Note (Signed)
Date of procedure: 08/23/18  Pre-operative diagnosis: Visually significant cataract, Right Eye (H25.811)  Post-operative diagnosis: Visually significant cataract, Right Eye  Procedure: Removal of cataract via phacoemulsification and insertion of intra-ocular lens [employer] Vision PCB00  +20.0D into the capsular bag of the Right Eye  Attending surgeon: Gerda Diss. Che Rachal, MD, MA  Anesthesia: MAC, Topical Akten  Complications: None  Estimated Blood Loss: <66m (minimal)  Specimens: None  Implants: As above  Indications:  Visually significant cataract, Right Eye  Procedure:  The patient was seen and identified in the pre-operative area. The operative eye was identified and dilated.  The operative eye was marked.  Topical anesthesia was administered to the operative eye.     The patient was then to the operative suite and placed in the supine position.  A timeout was performed confirming the patient, procedure to be performed, and all other relevant information.   The patient's face was prepped and draped in the usual fashion for intra-ocular surgery.  A lid speculum was placed into the operative eye and the surgical microscope moved into place and focused.  A superotemporal paracentesis was created using a 20 gauge paracentesis blade.  Shugarcaine was injected into the anterior chamber.  Viscoelastic was injected into the anterior chamber.  A temporal clear-corneal main wound incision was created using a 2.459mmicrokeratome.  A continuous curvilinear capsulorrhexis was initiated using an irrigating cystitome and completed using capsulorrhexis forceps.  Hydrodissection and hydrodeliniation were performed.  Viscoelastic was injected into the anterior chamber.  A phacoemulsification handpiece and a chopper as a second instrument were used to remove the nucleus and epinucleus. The irrigation/aspiration handpiece was used to remove any remaining cortical material.   The capsular bag was  reinflated with viscoelastic, checked, and found to be intact.  The intraocular lens was inserted into the capsular bag and dialed into place using a Kuglen hook.  The irrigation/aspiration handpiece was used to remove any remaining viscoelastic.  The clear corneal wound and paracentesis wounds were then hydrated and checked with Weck-Cels to be watertight.  The lid-speculum and drape was removed, and the patient's face was cleaned with a wet and dry 4x4.  Maxitrol was instilled in the eye before a clear shield was taped over the eye. The patient was taken to the post-operative care unit in good condition, having tolerated the procedure well.  Post-Op Instructions: [facility] and will receive all other orders and instructions.

## 2018-08-23 NOTE — Anesthesia Postprocedure Evaluation (Signed)
Anesthesia Post Note  Patient: [name]  Procedure(s) Performed: CATARACT EXTRACTION PHACO AND INTRAOCULAR LENS PLACEMENT RIGHT EYE (Right Eye)  Patient location during evaluation: Short Stay Anesthesia Type: MAC Level of consciousness: awake and alert and oriented Pain management: pain level controlled Vital Signs Assessment: post-procedure vital signs reviewed and stable Respiratory status: spontaneous breathing Cardiovascular status: stable Postop Assessment: no apparent nausea or vomiting Anesthetic complications: no     Last Vitals:  Vitals:   08/23/18 0724  BP: (!) 159/67  Pulse: (!) 58  Resp: 16  Temp: 36.8 C  SpO2: 94%    Last Pain:  Vitals:   08/23/18 0724  TempSrc: Oral  PainSc: 0-No pain                 Carlisa Eble A

## 2018-08-23 NOTE — Anesthesia Preprocedure Evaluation (Signed)
Anesthesia Evaluation    Airway Mallampati: II       Dental  (+) Upper Dentures, Lower Dentures   Pulmonary COPD,  COPD inhaler, former smoker,    breath sounds clear to auscultation       Cardiovascular hypertension, On Medications  Rhythm:regular     Neuro/Psych    GI/Hepatic GERD  ,  Endo/Other  diabetes, Type 2  Renal/GU      Musculoskeletal   Abdominal   Peds  Hematology  (+) Blood dyscrasia, anemia ,   Anesthesia Other Findings Morbid obesity, physically deconditioned Requests same anesthetic Phaco/IOL 4 wks ago  Reproductive/Obstetrics                             Anesthesia Physical Anesthesia Plan  ASA: III  Anesthesia Plan: MAC   Post-op Pain Management:    Induction:   PONV Risk Score and Plan:   Airway Management Planned:   Additional Equipment:   Intra-op Plan:   Post-operative Plan:   Informed Consent:   Plan Discussed with: Anesthesiologist  Anesthesia Plan Comments:         Anesthesia Quick Evaluation

## 2018-08-23 NOTE — H&P (Signed)
The H and P was reviewed and updated. The patient was examined.  No changes were found after exam.  The surgical eye was marked.  

## 2018-08-26 ENCOUNTER — Encounter (HOSPITAL_COMMUNITY): Payer: Self-pay | Admitting: Ophthalmology

## 2018-09-02 ENCOUNTER — Ambulatory Visit (HOSPITAL_COMMUNITY)
Admission: RE | Admit: 2018-09-02 | Discharge: 2018-09-02 | Disposition: A | Discharge status: Home / Self Care | Payer: Medicare Other | Source: Ambulatory Visit | Attending: Family Medicine | Admitting: Family Medicine

## 2018-09-02 DIAGNOSIS — Z1231 Encounter for screening mammogram for malignant neoplasm of breast: Secondary | ICD-10-CM | POA: Insufficient documentation

## 2018-10-03 DIAGNOSIS — I10 Essential (primary) hypertension: Secondary | ICD-10-CM | POA: Diagnosis not present

## 2018-11-04 DIAGNOSIS — I10 Essential (primary) hypertension: Secondary | ICD-10-CM | POA: Diagnosis not present

## 2018-11-04 DIAGNOSIS — Z720 Tobacco use: Secondary | ICD-10-CM | POA: Diagnosis not present

## 2018-11-04 DIAGNOSIS — E119 Type 2 diabetes mellitus without complications: Secondary | ICD-10-CM | POA: Diagnosis not present

## 2018-11-04 DIAGNOSIS — E782 Mixed hyperlipidemia: Secondary | ICD-10-CM | POA: Diagnosis not present

## 2018-11-04 DIAGNOSIS — G5603 Carpal tunnel syndrome, bilateral upper limbs: Secondary | ICD-10-CM | POA: Diagnosis not present

## 2018-11-27 ENCOUNTER — Ambulatory Visit: Payer: Medicare Other | Admitting: Orthopedic Surgery

## 2018-11-27 ENCOUNTER — Ambulatory Visit (INDEPENDENT_AMBULATORY_CARE_PROVIDER_SITE_OTHER): Payer: Medicare Other

## 2018-11-27 ENCOUNTER — Other Ambulatory Visit: Payer: Self-pay

## 2018-11-27 ENCOUNTER — Encounter: Payer: Self-pay | Admitting: Orthopedic Surgery

## 2018-11-27 VITALS — BP 144/67 | HR 61 | Ht 65.0 in | Wt 226.0 lb

## 2018-11-27 DIAGNOSIS — G8929 Other chronic pain: Secondary | ICD-10-CM

## 2018-11-27 DIAGNOSIS — M5440 Lumbago with sciatica, unspecified side: Secondary | ICD-10-CM | POA: Diagnosis not present

## 2018-11-27 DIAGNOSIS — Z96642 Presence of left artificial hip joint: Secondary | ICD-10-CM

## 2018-11-27 DIAGNOSIS — M1990 Unspecified osteoarthritis, unspecified site: Secondary | ICD-10-CM

## 2018-11-27 MED ORDER — MELOXICAM 15 MG PO TABS: 15.0000 mg | ORAL_TABLET | Freq: Every day | ORAL | 5 refills | Status: DC

## 2018-11-27 MED ORDER — TRAMADOL HCL 50 MG PO TABS: 50.0000 mg | ORAL_TABLET | Freq: Four times a day (QID) | ORAL | 5 refills | Status: DC | PRN

## 2018-11-27 NOTE — Patient Instructions (Signed)
Return 1 year for left hip x-ray  We will order therapy and the therapist will call you to make an appointment at [facility] outpatient in [place]

## 2018-11-27 NOTE — Progress Notes (Signed)
Progress Note   Patient ID: [name], female   DOB: Dec 17, 1950, 68 y.o.   MRN: [id number]   Chief Complaint  Patient presents with  . 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  Hip seems to be functioning well.  She has pain in the right groin with consistent with osteoarthritis  No problems with the knees which were replaced  No tenderness or pain dislocation subluxation left hip  She does have lower back pain dull ache radiates to the right hip and thigh relieved by meloxicam and tramadol.  She ran out of the medications did not know who to call for refills     Review of Systems  Musculoskeletal: Positive for back pain.  Neurological: Negative for tingling and speech change.   Current Meds  Medication Sig  . albuterol (PROVENTIL HFA;VENTOLIN HFA) 108 (90 BASE) MCG/ACT inhaler Inhale 2 puffs into the lungs every 4 (four) hours as needed for wheezing or shortness of breath.  Marland Kitchen aspirin EC 81 MG tablet Take 81 mg by mouth daily.  Marland Kitchen buPROPion (WELLBUTRIN XL) 150 MG 24 hr tablet Take 150 mg by mouth daily.  . Cholecalciferol (VITAMIN D3) 2000 units TABS Take 2,000 Units by mouth daily.   Marland Kitchen lisinopril (PRINIVIL,ZESTRIL) 10 MG tablet Take 10 mg by mouth every evening.   . meloxicam (MOBIC) 15 MG tablet Take 1 tablet (15 mg total) by mouth daily.  Marland Kitchen PRESCRIPTION MEDICATION Place 1 drop into the left eye 2 (two) times daily. Imprimis  . triamterene-hydrochlorothiazide (DYAZIDE) 37.5-25 MG capsule Take 1 capsule by mouth daily.  . verapamil (CALAN-SR) 240 MG CR tablet Take 240 mg by mouth daily.   . [DISCONTINUED] meloxicam (MOBIC) 15 MG tablet Take 1 tablet (15 mg total) by mouth daily.    Past Medical History:  Diagnosis Date  . Cancer ([place])    ovarian  . Carpal tunnel syndrome    Nocturnal and positional, bilateral  . COPD (chronic obstructive pulmonary  disease) ([place])   . Degenerative joint disease   . Diabetes mellitus    Insulin-dependent diabetes  . Hypercholesteremia   . Hypertension      Allergies  Allergen Reactions  . Gabapentin Other (See Comments)    suicidal thoughts     BP (!) 144/67   Pulse 61   Ht 5\' 5"  (1.651 m)   Wt 226 lb (102.5 kg)   BMI 37.61 kg/m    Physical Exam Vitals signs reviewed.  Constitutional:      Appearance: Normal appearance. She is well-developed.  Neurological:     Mental Status: She is alert and oriented to person, place, and time.     Comments: Uses a cane in her right hand  Psychiatric:        Attention and Perception: Attention normal.        Mood and Affect: Mood and affect normal.        Speech: Speech normal.        Behavior: Behavior normal.        Thought Content: Thought content normal.        Judgment: Judgment normal.   Ortho Exam Left hip normal range of motion no tenderness skin is intact hip is stable.  Strength is normal in the hip flexors.  Flexion arc 120 degrees abduction 20 degrees.  Distally she has mild edema no sensory  abnormalities  Right hip pain with hip flexion especially with internal rotation which is limited to 20 degrees.  Flexion is 120 degrees hip is stable hip flexors are normal skin is intact pulse and perfusion are normal sensory is normal.  She has tenderness in the lower back and central in the lower portion of the back as well as the right side   MEDICAL DECISION MAKING   Imaging:  AP pelvis and left AP and lateral.  Prosthesis is in stable configuration there is some heterotopic bone formation in the iliopsoas muscle.  Hip is stable  Right hip shows mild degenerative changes in the joint  Encounter Diagnoses  Name Primary?  . 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 Chronic low back pain with sciatica, sciatica laterality unspecified, unspecified back pain laterality   . Arthritis      PLAN: (RX., injection,  surgery,frx,mri/ct, XR 2 body ares) Follow-up 1 year for x-ray left hip  Refill meloxicam and tramadol  2 times a week physical therapy for 4 weeks for lumbar spine  Meds ordered this encounter  Medications  . traMADol (ULTRAM) 50 MG tablet    Sig: Take 1 tablet (50 mg total) by mouth every 6 (six) hours as needed.    Dispense:  60 tablet    Refill:  5  . meloxicam (MOBIC) 15 MG tablet    Sig: Take 1 tablet (15 mg total) by mouth daily.    Dispense:  60 tablet    Refill:  5   11:09 AM 11/27/2018

## 2018-12-03 DIAGNOSIS — E1169 Type 2 diabetes mellitus with other specified complication: Secondary | ICD-10-CM | POA: Diagnosis not present

## 2018-12-03 DIAGNOSIS — J208 Acute bronchitis due to other specified organisms: Secondary | ICD-10-CM | POA: Diagnosis not present

## 2019-01-12 ENCOUNTER — Encounter: Payer: Self-pay | Admitting: Orthopedic Surgery

## 2019-02-03 DIAGNOSIS — E785 Hyperlipidemia, unspecified: Secondary | ICD-10-CM | POA: Diagnosis not present

## 2019-02-03 DIAGNOSIS — E1140 Type 2 diabetes mellitus with diabetic neuropathy, unspecified: Secondary | ICD-10-CM | POA: Diagnosis not present

## 2019-02-03 DIAGNOSIS — Z720 Tobacco use: Secondary | ICD-10-CM | POA: Diagnosis not present

## 2019-02-06 ENCOUNTER — Other Ambulatory Visit: Payer: Self-pay

## 2019-02-06 NOTE — Patient Outreach (Signed)
[place] [facility]) Care Management  02/06/2019  [name] Aug 23, 1951 [id number]   Medication Adherence call to [name] [facility] spoke with patient she is due on Rosuvastatin 5 mg she explain she is taking 1 tablet daily but ask if we can call [facility] and order this medication ,[facility] will mail out with in 5-7 days. [name] is showing past due under [place].  [place] Management Direct Dial [phone]  Fax [phone] [email or web address]

## 2019-02-12 DIAGNOSIS — I129 Hypertensive chronic kidney disease with stage 1 through stage 4 chronic kidney disease, or unspecified chronic kidney disease: Secondary | ICD-10-CM | POA: Diagnosis not present

## 2019-02-12 DIAGNOSIS — Z7189 Other specified counseling: Secondary | ICD-10-CM | POA: Diagnosis not present

## 2019-02-12 DIAGNOSIS — Z720 Tobacco use: Secondary | ICD-10-CM | POA: Diagnosis not present

## 2019-02-12 DIAGNOSIS — I10 Essential (primary) hypertension: Secondary | ICD-10-CM | POA: Diagnosis not present

## 2019-02-13 ENCOUNTER — Other Ambulatory Visit: Payer: Self-pay

## 2019-02-13 MED ORDER — CHLORTHALIDONE 25 MG PO TABS: 25.0000 mg | ORAL_TABLET | Freq: Every day | ORAL | 3 refills | Status: DC

## 2019-02-13 NOTE — Telephone Encounter (Signed)
Refilled chlorthalidone to [facility]

## 2019-02-26 DIAGNOSIS — Z0000 Encounter for general adult medical examination without abnormal findings: Secondary | ICD-10-CM | POA: Diagnosis not present

## 2019-02-26 DIAGNOSIS — E782 Mixed hyperlipidemia: Secondary | ICD-10-CM | POA: Diagnosis not present

## 2019-02-26 DIAGNOSIS — E1169 Type 2 diabetes mellitus with other specified complication: Secondary | ICD-10-CM | POA: Diagnosis not present

## 2019-02-26 DIAGNOSIS — I10 Essential (primary) hypertension: Secondary | ICD-10-CM | POA: Diagnosis not present

## 2019-03-17 DIAGNOSIS — Z0131 Encounter for examination of blood pressure with abnormal findings: Secondary | ICD-10-CM | POA: Diagnosis not present

## 2019-03-31 ENCOUNTER — Other Ambulatory Visit: Payer: Self-pay | Admitting: Orthopedic Surgery

## 2019-04-01 DIAGNOSIS — Z0131 Encounter for examination of blood pressure with abnormal findings: Secondary | ICD-10-CM | POA: Diagnosis not present

## 2019-04-07 ENCOUNTER — Other Ambulatory Visit: Payer: Self-pay

## 2019-04-07 ENCOUNTER — Ambulatory Visit (INDEPENDENT_AMBULATORY_CARE_PROVIDER_SITE_OTHER): Payer: Medicare Other

## 2019-04-07 ENCOUNTER — Ambulatory Visit (INDEPENDENT_AMBULATORY_CARE_PROVIDER_SITE_OTHER): Payer: Medicare Other | Admitting: Orthopedic Surgery

## 2019-04-07 ENCOUNTER — Encounter: Payer: Self-pay | Admitting: Orthopedic Surgery

## 2019-04-07 ENCOUNTER — Telehealth: Payer: Self-pay | Admitting: Radiology

## 2019-04-07 VITALS — BP 195/93 | HR 65 | Ht 65.0 in | Wt 232.0 lb

## 2019-04-07 DIAGNOSIS — M5410 Radiculopathy, site unspecified: Secondary | ICD-10-CM

## 2019-04-07 DIAGNOSIS — M545 Low back pain, unspecified: Secondary | ICD-10-CM

## 2019-04-07 MED ORDER — TRAMADOL HCL 50 MG PO TABS: 50.0000 mg | ORAL_TABLET | Freq: Four times a day (QID) | ORAL | 5 refills | Status: DC | PRN

## 2019-04-07 MED ORDER — GABAPENTIN 100 MG PO CAPS: 100.0000 mg | ORAL_CAPSULE | Freq: Three times a day (TID) | ORAL | 2 refills | Status: DC

## 2019-04-07 NOTE — Progress Notes (Signed)
[name]  04/07/2019  HISTORY SECTION :  Chief Complaint  Patient presents with  . Back Pain    into right thigh, then right posterior lower leg    68 year old female status post revision left total hip doing well from the hip complains of right lower extremity radicular pain radiates down into the ankle  History of lumbar disc disease 2017 at MRI.  Currently not on any medications   Pain in the right leg now for several weeks if not months no prior treatment moderate in severity seems to be stabilized not worsening or improving not associated with actual numbness or tingling although the pain feels like it is burning   Review of Systems  Gastrointestinal: Negative.   Genitourinary: Negative.   Musculoskeletal: Positive for back pain and joint pain.  Neurological: Negative for focal weakness.  All other systems reviewed and are negative.    Past Medical History:  Diagnosis Date  . Cancer ([place])    ovarian  . Carpal tunnel syndrome    Nocturnal and positional, bilateral  . COPD (chronic obstructive pulmonary disease) ([place])   . Degenerative joint disease   . Diabetes mellitus    Insulin-dependent diabetes  . Hypercholesteremia   . Hypertension     Past Surgical History:  Procedure Laterality Date  . ABDOMINAL HYSTERECTOMY     BSO  . ABDOMINAL SURGERY     for bleeding after hysterectomy  . CATARACT EXTRACTION W/PHACO Left 07/19/2018   Procedure: CATARACT EXTRACTION PHACO AND INTRAOCULAR LENS PLACEMENT (IOC) LEFT CDE:3.26;  Surgeon: Baruch Goldmann, MD;  Location: [facility];  Service: Ophthalmology;  Laterality: Left;  . CATARACT EXTRACTION W/PHACO Right 08/23/2018   Procedure: CATARACT EXTRACTION PHACO AND INTRAOCULAR LENS PLACEMENT RIGHT EYE;  Surgeon: Baruch Goldmann, MD;  Location: [facility];  Service: Ophthalmology;  Laterality: Right;  right  . CHOLECYSTECTOMY    . COLONOSCOPY  2007   Dr. Gala Romney: internal hemorrhoids, few scattered sigmoid diverticula  . COLONOSCOPY  N/A 04/09/2014   BZJ:IRCVELF diverticulosis. Redundant colon. Single colonic polyp-removed as described above.  . ESOPHAGOGASTRODUODENOSCOPY  2008   Dr. Gala Romney: erosive esophagitis, patulous EG junction  . ESOPHAGOGASTRODUODENOSCOPY N/A 04/09/2014   YBO:FBPZWCH reflux esophagitis. Schatzki's ring-status post dilation as described above. Hiatal hernia. Gastricerosions-status post gastric biopsy  . HIP CLOSED REDUCTION Left 01/15/2016   Procedure: CLOSED REDUCTION LEFT HIP PROSTHESIS;  Surgeon: Carole Civil, MD;  Location: [facility];  Service: Orthopedics;  Laterality: Left;  . left hip replacement  12/08/2015  . MALONEY DILATION N/A 04/09/2014   Procedure: Venia Minks DILATION;  Surgeon: Daneil Dolin, MD;  Location: [facility];  Service: Endoscopy;  Laterality: N/A;  . REPLACEMENT TOTAL KNEE     bilateral  . SAVORY DILATION N/A 04/09/2014   Procedure: SAVORY DILATION;  Surgeon: Daneil Dolin, MD;  Location: [facility];  Service: Endoscopy;  Laterality: N/A;  . TOENAIL EXCISION     R great toenail; artificial nail  . TOTAL HIP ARTHROPLASTY Left 12/08/2015   Procedure: TOTAL HIP ARTHROPLASTY;  Surgeon: Carole Civil, MD;  Location: [facility];  Service: Orthopedics;  Laterality: Left;  . TOTAL HIP REVISION Left 01/26/2016   Procedure: LEFT TOTAL HIP REVISION;  Surgeon: Carole Civil, MD;  Location: [facility];  Service: Orthopedics;  Laterality: Left;     Allergies  Allergen Reactions  . Gabapentin Other (See Comments)    suicidal thoughts     Current Outpatient Medications:  .  aspirin EC 81 MG  tablet, Take 81 mg by mouth daily., Disp: , Rfl:  .  atenolol (TENORMIN) 25 MG tablet, Take 1 tablet (25 mg total) by mouth daily., Disp: 90 tablet, Rfl: 3 .  hydrALAZINE (APRESOLINE) 25 MG tablet, Take 25 mg by mouth 3 (three) times daily., Disp: , Rfl:  .  lisinopril (PRINIVIL,ZESTRIL) 10 MG tablet, Take 10 mg by mouth every evening. , Disp: , Rfl:  .  rosuvastatin (CRESTOR) 5 MG tablet,  , Disp: , Rfl:  .  triamterene-hydrochlorothiazide (DYAZIDE) 37.5-25 MG capsule, Take 1 capsule by mouth daily., Disp: , Rfl:  .  verapamil (CALAN-SR) 240 MG CR tablet, Take 240 mg by mouth daily. , Disp: , Rfl:  .  gabapentin (NEURONTIN) 100 MG capsule, Take 1 capsule (100 mg total) by mouth 3 (three) times daily., Disp: 90 capsule, Rfl: 2 .  insulin degludec (TRESIBA FLEXTOUCH) 100 UNIT/ML SOPN FlexTouch Pen, Inject 18 Units into the skin at bedtime., Disp: , Rfl:  .  traMADol (ULTRAM) 50 MG tablet, Take 1 tablet (50 mg total) by mouth every 6 (six) hours as needed., Disp: 60 tablet, Rfl: 5    PHYSICAL EXAM SECTION: 1) BP (!) 195/93   Pulse 65   Ht 5\' 5"  (1.651 m)   Wt 232 lb (105.2 kg)   BMI 38.61 kg/m   Body mass index is 38.61 kg/m. General appearance: Well-developed well-nourished no gross deformities  2) Cardiovascular normal pulse and perfusion in all 4 extremities normal color without edema  3) Neurologically deep tendon reflexes are equal and normal, no sensation loss or deficits no pathologic reflexes  4) Psychological: Awake alert and oriented x3 mood and affect normal  5) Skin no lacerations or ulcerations no nodularity no palpable masses, no erythema or nodularity  6) Musculoskeletal:   Normal sensation to sharp pinprick bilaterally in her lower extremities no weakness in the lower extremities hip range of motion normal bilaterally  Tenderness lumbar spine decreased range of motion decreased flexion extension rotation side bending  Straight leg raises equivocal   MEDICAL DECISION SECTION:  Encounter Diagnoses  Name Primary?  . Radicular pain of right lower extremity Yes  . Lumbar pain     Imaging see dictated report arthritis noted lower back loss of lumbar lordosis  MRI from 2017 report read IMPRESSION: 1. New soft disc protrusion into the left neural foramen increased from a bulge on the prior study. There is now a mass effect upon the left L3 nerve in  and lateral to the neural foramen. 2. Chronic L3-4 small right foraminal and extra foraminal disc protrusion with a minimal mass effect upon the right L3 nerve lateral to the neural foramen.     Electronically Signed   By: Lorriane Shire M.D.   On: 10/06/2015 11:16  Plan:  (Rx., Inj., surg., Frx, MRI/CT, XR:2)  Recommend NSAID therapy  Meds ordered this encounter  Medications  . gabapentin (NEURONTIN) 100 MG capsule    Sig: Take 1 capsule (100 mg total) by mouth 3 (three) times daily.    Dispense:  90 capsule    Refill:  2  . traMADol (ULTRAM) 50 MG tablet    Sig: Take 1 tablet (50 mg total) by mouth every 6 (six) hours as needed.    Dispense:  60 tablet    Refill:  5    Physical therapy for 6 weeks  10:27 AM

## 2019-04-07 NOTE — Telephone Encounter (Signed)
Patient can not use the Gabapentin caused suicidal thoughts in past, can you recommend anything else, or just the Tramadol ?

## 2019-04-07 NOTE — Telephone Encounter (Signed)
Stop no other meds available

## 2019-04-07 NOTE — Patient Instructions (Signed)
Chronic Back Pain When back pain lasts longer than 3 months, it is called chronic back pain.The cause of your back pain may not be known. Some common causes include:  Wear and tear (degenerative disease) of the bones, ligaments, or disks in your back.  Inflammation and stiffness in your back (arthritis). People who have chronic back pain often go through certain periods in which the pain is more intense (flare-ups). Many people can learn to manage the pain with home care. Follow these instructions at home: Pay attention to any changes in your symptoms. Take these actions to help with your pain: Activity   Avoid bending and other activities that make the problem worse.  Maintain a proper position when standing or sitting: ? When standing, keep your upper back and neck straight, with your shoulders pulled back. Avoid slouching. ? When sitting, keep your back straight and relax your shoulders. Do not round your shoulders or pull them backward.  Do not sit or stand in one place for long periods of time.  Take brief periods of rest throughout the day. This will reduce your pain. Resting in a lying or standing position is usually better than sitting to rest.  When you are resting for longer periods, mix in some mild activity or stretching between periods of rest. This will help to prevent stiffness and pain.  Get regular exercise. Ask your health care provider what activities are safe for you.  Do not lift anything that is heavier than 10 lb (4.5 kg). Always use proper lifting technique, which includes: ? Bending your knees. ? Keeping the load close to your body. ? Avoiding twisting.  Sleep on a firm mattress in a comfortable position. Try lying on your side with your knees slightly bent. If you lie on your back, put a pillow under your knees. Managing pain  If directed, apply ice to the painful area. Your health care provider may recommend applying ice during the first 24-48 hours after  a flare-up begins. ? Put ice in a plastic bag. ? Place a towel between your skin and the bag. ? Leave the ice on for 20 minutes, 2-3 times per day.  If directed, apply heat to the affected area as often as told by your health care provider. Use the heat source that your health care provider recommends, such as a moist heat pack or a heating pad. ? Place a towel between your skin and the heat source. ? Leave the heat on for 20-30 minutes. ? Remove the heat if your skin turns bright red. This is especially important if you are unable to feel pain, heat, or cold. You may have a greater risk of getting burned.  Try soaking in a warm tub.  Take over-the-counter and prescription medicines only as told by your health care provider.  Keep all follow-up visits as told by your health care provider. This is important. Contact a health care provider if:  You have pain that is not relieved with rest or medicine. Get help right away if:  You have weakness or numbness in one or both of your legs or feet.  You have trouble controlling your bladder or your bowels.  You have nausea or vomiting.  You have pain in your abdomen.  You have shortness of breath or you faint. This information is not intended to replace advice given to you by your health care provider. Make sure you discuss any questions you have with your health care provider. Document Released: 10/12/2004  Document Revised: 12/26/2018 Document Reviewed: 03/14/2017 Elsevier Patient Education  [place].  Radicular Pain Radicular pain is a type of pain that spreads from your back or neck along a spinal nerve. Spinal nerves are nerves that leave the spinal cord and go to the muscles. Radicular pain is sometimes called radiculopathy, radiculitis, or a pinched nerve. When you have this type of pain, you may also have weakness, numbness, or tingling in the area of your body that is supplied by the nerve. The pain may feel sharp and  burning. Depending on which spinal nerve is affected, the pain may occur in the:  Neck area (cervical radicular pain). You may also feel pain, numbness, weakness, or tingling in the arms.  Mid-spine area (thoracic radicular pain). You would feel this pain in the back and chest. This type is rare.  Lower back area (lumbar radicular pain). You would feel this pain as low back pain. You may feel pain, numbness, weakness, or tingling in the buttocks or legs. Sciatica is a type of lumbar radicular pain that shoots down the back of the leg. Radicular pain occurs when one of the spinal nerves becomes irritated or squeezed (compressed). It is often caused by something pushing on a spinal nerve, such as one of the bones of the spine (vertebrae) or one of the round cushions between vertebrae (intervertebral disks). This can result from:  An injury.  Wear and tear or aging of a disk.  The growth of a bone spur that pushes on the nerve. Radicular pain often goes away when you follow instructions from your health care provider for relieving pain at home. Follow these instructions at home: Managing pain      If directed, put ice on the affected area: ? Put ice in a plastic bag. ? Place a towel between your skin and the bag. ? Leave the ice on for 20 minutes, 2-3 times a day.  If directed, apply heat to the affected area as often as told by your health care provider. Use the heat source that your health care provider recommends, such as a moist heat pack or a heating pad. ? Place a towel between your skin and the heat source. ? Leave the heat on for 20-30 minutes. ? Remove the heat if your skin turns bright red. This is especially important if you are unable to feel pain, heat, or cold. You may have a greater risk of getting burned. Activity   Do not sit or rest in bed for long periods of time.  Try to stay as active as possible. Ask your health care provider what type of exercise or activity is  best for you.  Avoid activities that make your pain worse, such as bending and lifting.  Do not lift anything that is heavier than 10 lb (4.5 kg), or the limit that you are told, until your health care provider says that it is safe.  Practice using proper technique when lifting items. Proper lifting technique involves bending your knees and rising up.  Do strength and range-of-motion exercises only as told by your health care provider or physical therapist. General instructions  Take over-the-counter and prescription medicines only as told by your health care provider.  Pay attention to any changes in your symptoms.  Keep all follow-up visits as told by your health care provider. This is important. ? Your health care provider may send you to a physical therapist to help with this pain. Contact a health care provider  if:  Your pain and other symptoms get worse.  Your pain medicine is not helping.  Your pain has not improved after a few weeks of home care.  You have a fever. Get help right away if:  You have severe pain, weakness, or numbness.  You have difficulty with bladder or bowel control. Summary  Radicular pain is a type of pain that spreads from your back or neck along a spinal nerve.  When you have radicular pain, you may also have weakness, numbness, or tingling in the area of your body that is supplied by the nerve.  The pain may feel sharp or burning.  Radicular pain may be treated with ice, heat, medicines, or physical therapy. This information is not intended to replace advice given to you by your health care provider. Make sure you discuss any questions you have with your health care provider. Document Released: 10/12/2004 Document Revised: 03/19/2018 Document Reviewed: 03/19/2018 Elsevier Patient Education  2020 [employer].

## 2019-04-07 NOTE — Telephone Encounter (Signed)
Thanks I called her to advise.  

## 2019-04-07 NOTE — Addendum Note (Signed)
Addended byCandice [facility] on: 04/07/2019 10:30 AM   Modules accepted: Orders

## 2019-04-08 DIAGNOSIS — M545 Low back pain: Secondary | ICD-10-CM | POA: Diagnosis not present

## 2019-04-08 DIAGNOSIS — R262 Difficulty in walking, not elsewhere classified: Secondary | ICD-10-CM | POA: Diagnosis not present

## 2019-04-08 DIAGNOSIS — M6281 Muscle weakness (generalized): Secondary | ICD-10-CM | POA: Diagnosis not present

## 2019-04-08 DIAGNOSIS — M2560 Stiffness of unspecified joint, not elsewhere classified: Secondary | ICD-10-CM | POA: Diagnosis not present

## 2019-04-09 DIAGNOSIS — R262 Difficulty in walking, not elsewhere classified: Secondary | ICD-10-CM | POA: Diagnosis not present

## 2019-04-09 DIAGNOSIS — M545 Low back pain: Secondary | ICD-10-CM | POA: Diagnosis not present

## 2019-04-09 DIAGNOSIS — M6281 Muscle weakness (generalized): Secondary | ICD-10-CM | POA: Diagnosis not present

## 2019-04-09 DIAGNOSIS — M2560 Stiffness of unspecified joint, not elsewhere classified: Secondary | ICD-10-CM | POA: Diagnosis not present

## 2019-04-11 DIAGNOSIS — M6281 Muscle weakness (generalized): Secondary | ICD-10-CM | POA: Diagnosis not present

## 2019-04-11 DIAGNOSIS — M545 Low back pain: Secondary | ICD-10-CM | POA: Diagnosis not present

## 2019-04-11 DIAGNOSIS — M2560 Stiffness of unspecified joint, not elsewhere classified: Secondary | ICD-10-CM | POA: Diagnosis not present

## 2019-04-11 DIAGNOSIS — R262 Difficulty in walking, not elsewhere classified: Secondary | ICD-10-CM | POA: Diagnosis not present

## 2019-04-14 DIAGNOSIS — M545 Low back pain: Secondary | ICD-10-CM | POA: Diagnosis not present

## 2019-04-14 DIAGNOSIS — R262 Difficulty in walking, not elsewhere classified: Secondary | ICD-10-CM | POA: Diagnosis not present

## 2019-04-14 DIAGNOSIS — M2560 Stiffness of unspecified joint, not elsewhere classified: Secondary | ICD-10-CM | POA: Diagnosis not present

## 2019-04-14 DIAGNOSIS — M6281 Muscle weakness (generalized): Secondary | ICD-10-CM | POA: Diagnosis not present

## 2019-04-16 DIAGNOSIS — M2560 Stiffness of unspecified joint, not elsewhere classified: Secondary | ICD-10-CM | POA: Diagnosis not present

## 2019-04-16 DIAGNOSIS — M545 Low back pain: Secondary | ICD-10-CM | POA: Diagnosis not present

## 2019-04-16 DIAGNOSIS — M6281 Muscle weakness (generalized): Secondary | ICD-10-CM | POA: Diagnosis not present

## 2019-04-16 DIAGNOSIS — R262 Difficulty in walking, not elsewhere classified: Secondary | ICD-10-CM | POA: Diagnosis not present

## 2019-04-18 DIAGNOSIS — M2560 Stiffness of unspecified joint, not elsewhere classified: Secondary | ICD-10-CM | POA: Diagnosis not present

## 2019-04-18 DIAGNOSIS — M545 Low back pain: Secondary | ICD-10-CM | POA: Diagnosis not present

## 2019-04-18 DIAGNOSIS — M6281 Muscle weakness (generalized): Secondary | ICD-10-CM | POA: Diagnosis not present

## 2019-04-18 DIAGNOSIS — R262 Difficulty in walking, not elsewhere classified: Secondary | ICD-10-CM | POA: Diagnosis not present

## 2019-04-21 DIAGNOSIS — R262 Difficulty in walking, not elsewhere classified: Secondary | ICD-10-CM | POA: Diagnosis not present

## 2019-04-21 DIAGNOSIS — M545 Low back pain: Secondary | ICD-10-CM | POA: Diagnosis not present

## 2019-04-21 DIAGNOSIS — M2560 Stiffness of unspecified joint, not elsewhere classified: Secondary | ICD-10-CM | POA: Diagnosis not present

## 2019-04-21 DIAGNOSIS — M6281 Muscle weakness (generalized): Secondary | ICD-10-CM | POA: Diagnosis not present

## 2019-04-23 DIAGNOSIS — R262 Difficulty in walking, not elsewhere classified: Secondary | ICD-10-CM | POA: Diagnosis not present

## 2019-04-23 DIAGNOSIS — M2560 Stiffness of unspecified joint, not elsewhere classified: Secondary | ICD-10-CM | POA: Diagnosis not present

## 2019-04-23 DIAGNOSIS — M6281 Muscle weakness (generalized): Secondary | ICD-10-CM | POA: Diagnosis not present

## 2019-04-23 DIAGNOSIS — M545 Low back pain: Secondary | ICD-10-CM | POA: Diagnosis not present

## 2019-04-25 DIAGNOSIS — M6281 Muscle weakness (generalized): Secondary | ICD-10-CM | POA: Diagnosis not present

## 2019-04-25 DIAGNOSIS — M2560 Stiffness of unspecified joint, not elsewhere classified: Secondary | ICD-10-CM | POA: Diagnosis not present

## 2019-04-25 DIAGNOSIS — R262 Difficulty in walking, not elsewhere classified: Secondary | ICD-10-CM | POA: Diagnosis not present

## 2019-04-25 DIAGNOSIS — M545 Low back pain: Secondary | ICD-10-CM | POA: Diagnosis not present

## 2019-04-28 DIAGNOSIS — M2560 Stiffness of unspecified joint, not elsewhere classified: Secondary | ICD-10-CM | POA: Diagnosis not present

## 2019-04-28 DIAGNOSIS — R262 Difficulty in walking, not elsewhere classified: Secondary | ICD-10-CM | POA: Diagnosis not present

## 2019-04-28 DIAGNOSIS — M545 Low back pain: Secondary | ICD-10-CM | POA: Diagnosis not present

## 2019-04-28 DIAGNOSIS — M6281 Muscle weakness (generalized): Secondary | ICD-10-CM | POA: Diagnosis not present

## 2019-04-30 DIAGNOSIS — R262 Difficulty in walking, not elsewhere classified: Secondary | ICD-10-CM | POA: Diagnosis not present

## 2019-04-30 DIAGNOSIS — M6281 Muscle weakness (generalized): Secondary | ICD-10-CM | POA: Diagnosis not present

## 2019-04-30 DIAGNOSIS — M545 Low back pain: Secondary | ICD-10-CM | POA: Diagnosis not present

## 2019-04-30 DIAGNOSIS — M2560 Stiffness of unspecified joint, not elsewhere classified: Secondary | ICD-10-CM | POA: Diagnosis not present

## 2019-05-05 DIAGNOSIS — I2510 Atherosclerotic heart disease of native coronary artery without angina pectoris: Secondary | ICD-10-CM | POA: Diagnosis not present

## 2019-05-05 DIAGNOSIS — M2560 Stiffness of unspecified joint, not elsewhere classified: Secondary | ICD-10-CM | POA: Diagnosis not present

## 2019-05-05 DIAGNOSIS — I10 Essential (primary) hypertension: Secondary | ICD-10-CM | POA: Diagnosis not present

## 2019-05-05 DIAGNOSIS — E782 Mixed hyperlipidemia: Secondary | ICD-10-CM | POA: Diagnosis not present

## 2019-05-05 DIAGNOSIS — M6281 Muscle weakness (generalized): Secondary | ICD-10-CM | POA: Diagnosis not present

## 2019-05-05 DIAGNOSIS — E1140 Type 2 diabetes mellitus with diabetic neuropathy, unspecified: Secondary | ICD-10-CM | POA: Diagnosis not present

## 2019-05-05 DIAGNOSIS — N182 Chronic kidney disease, stage 2 (mild): Secondary | ICD-10-CM | POA: Diagnosis not present

## 2019-05-05 DIAGNOSIS — R262 Difficulty in walking, not elsewhere classified: Secondary | ICD-10-CM | POA: Diagnosis not present

## 2019-05-05 DIAGNOSIS — M25551 Pain in right hip: Secondary | ICD-10-CM | POA: Diagnosis not present

## 2019-05-05 DIAGNOSIS — M545 Low back pain: Secondary | ICD-10-CM | POA: Diagnosis not present

## 2019-05-07 DIAGNOSIS — M2560 Stiffness of unspecified joint, not elsewhere classified: Secondary | ICD-10-CM | POA: Diagnosis not present

## 2019-05-07 DIAGNOSIS — M6281 Muscle weakness (generalized): Secondary | ICD-10-CM | POA: Diagnosis not present

## 2019-05-07 DIAGNOSIS — R262 Difficulty in walking, not elsewhere classified: Secondary | ICD-10-CM | POA: Diagnosis not present

## 2019-05-07 DIAGNOSIS — M545 Low back pain: Secondary | ICD-10-CM | POA: Diagnosis not present

## 2019-05-13 DIAGNOSIS — M6281 Muscle weakness (generalized): Secondary | ICD-10-CM | POA: Diagnosis not present

## 2019-05-13 DIAGNOSIS — M2560 Stiffness of unspecified joint, not elsewhere classified: Secondary | ICD-10-CM | POA: Diagnosis not present

## 2019-05-13 DIAGNOSIS — R262 Difficulty in walking, not elsewhere classified: Secondary | ICD-10-CM | POA: Diagnosis not present

## 2019-05-13 DIAGNOSIS — M545 Low back pain: Secondary | ICD-10-CM | POA: Diagnosis not present

## 2019-05-15 DIAGNOSIS — M545 Low back pain: Secondary | ICD-10-CM | POA: Diagnosis not present

## 2019-05-15 DIAGNOSIS — R262 Difficulty in walking, not elsewhere classified: Secondary | ICD-10-CM | POA: Diagnosis not present

## 2019-05-15 DIAGNOSIS — M6281 Muscle weakness (generalized): Secondary | ICD-10-CM | POA: Diagnosis not present

## 2019-05-15 DIAGNOSIS — M2560 Stiffness of unspecified joint, not elsewhere classified: Secondary | ICD-10-CM | POA: Diagnosis not present

## 2019-05-19 ENCOUNTER — Other Ambulatory Visit: Payer: Self-pay

## 2019-05-19 ENCOUNTER — Ambulatory Visit (INDEPENDENT_AMBULATORY_CARE_PROVIDER_SITE_OTHER): Payer: Medicare Other | Admitting: Orthopedic Surgery

## 2019-05-19 ENCOUNTER — Encounter: Payer: Self-pay | Admitting: Orthopedic Surgery

## 2019-05-19 VITALS — BP 146/80 | HR 65 | Ht 65.0 in | Wt 228.0 lb

## 2019-05-19 DIAGNOSIS — M5410 Radiculopathy, site unspecified: Secondary | ICD-10-CM

## 2019-05-19 NOTE — Progress Notes (Signed)
Progress Note   Patient ID: [name], female   DOB: 03-Nov-1950, 68 y.o.   MRN: [id number]   Chief Complaint  Patient presents with  . Back Pain    down right leg/ had some relief with therapy in beginning, but now is very painful again     Encounter Diagnosis  Name Primary?  . Radicular pain of right lower extremity Yes    68 years old comes in after physical therapy for recurring radicular right leg pain  Currently on Neurontin without adequate pain relief 04/07/2019   HISTORY SECTION :   Chief Complaint Patient presents with . Back Pain     into right thigh, then right posterior lower leg   68 year old female status post revision left total hip doing well from the hip complains of right lower extremity radicular pain radiates down into the ankle   History of lumbar disc disease 2017 at MRI.  Currently not on any medications    Pain in the right leg now for several weeks if not months no prior treatment moderate in severity seems to be stabilized not worsening or improving not associated with actual numbness or tingling although the pain feels like it is burning     Review of Systems  Gastrointestinal: Negative.   Genitourinary: Negative.   Musculoskeletal: Positive for back pain and joint pain.  Neurological: Negative for focal weakness.  All other systems reviewed and are negative.      Review of Systems  Gastrointestinal: Negative.   Genitourinary: Negative.   Neurological: Positive for focal weakness.      BP (!) 146/80   Pulse 65   Ht 5\' 5"  (1.651 m)   Wt 228 lb (103.4 kg)   BMI 37.94 kg/m   Physical Exam Vitals signs and nursing note reviewed.  Constitutional:      Appearance: Normal appearance.  Neurological:     Mental Status: She is alert and oriented to person, place, and time.  Psychiatric:        Mood and Affect: Mood normal.    As noted prior patient has tenderness in the lower back positive straight leg raise on the right with  no muscle deficit sensory changes in the L5 root and S1  Medical decisions:  (Established problem worse, x-ray ,physical therapy, over-the-counter medicines, read outside film or summarize x-ray)  Data  Imaging:   Lumbar spine July 2020  she has flattening of the lumbar lordosis she has arthritis in the facet joints disc spaces are open except for L2-3 which shows some narrowing   Impression facet joint arthritis   Encounter Diagnosis  Name Primary?  . Radicular pain of right lower extremity Yes    PLAN:   Repeat MRI to prepare for injections of the lumbar spine    Arther Abbott, MD 05/19/2019 11:52 AM

## 2019-05-20 DIAGNOSIS — D72829 Elevated white blood cell count, unspecified: Secondary | ICD-10-CM | POA: Diagnosis not present

## 2019-05-20 DIAGNOSIS — M2560 Stiffness of unspecified joint, not elsewhere classified: Secondary | ICD-10-CM | POA: Diagnosis not present

## 2019-05-20 DIAGNOSIS — M545 Low back pain: Secondary | ICD-10-CM | POA: Diagnosis not present

## 2019-05-20 DIAGNOSIS — R262 Difficulty in walking, not elsewhere classified: Secondary | ICD-10-CM | POA: Diagnosis not present

## 2019-05-20 DIAGNOSIS — M6281 Muscle weakness (generalized): Secondary | ICD-10-CM | POA: Diagnosis not present

## 2019-05-21 ENCOUNTER — Telehealth: Payer: Self-pay | Admitting: Radiology

## 2019-05-21 NOTE — Telephone Encounter (Signed)
Called patient to ask her to call [facility] to schedule the MRI   [phone]

## 2019-05-21 NOTE — Telephone Encounter (Signed)
-----   Message from Uvaldo Bristle sent at 05/21/2019  1:46 PM EDT ----- Regarding: MRI pending Patient [name] #[id number] called to check on status of MRI (for [facility] imaging)  (?)still awaiting insurance?

## 2019-05-21 NOTE — Telephone Encounter (Signed)
She was on the line, but disconnected before I could give her the number

## 2019-05-22 DIAGNOSIS — M6281 Muscle weakness (generalized): Secondary | ICD-10-CM | POA: Diagnosis not present

## 2019-05-22 DIAGNOSIS — R262 Difficulty in walking, not elsewhere classified: Secondary | ICD-10-CM | POA: Diagnosis not present

## 2019-05-22 DIAGNOSIS — M2560 Stiffness of unspecified joint, not elsewhere classified: Secondary | ICD-10-CM | POA: Diagnosis not present

## 2019-05-22 DIAGNOSIS — M545 Low back pain: Secondary | ICD-10-CM | POA: Diagnosis not present

## 2019-05-22 NOTE — Telephone Encounter (Signed)
She must have found the number scan is scheduled for 05/24/2019

## 2019-05-24 ENCOUNTER — Other Ambulatory Visit: Payer: Self-pay

## 2019-05-24 ENCOUNTER — Ambulatory Visit
Admission: RE | Admit: 2019-05-24 | Discharge: 2019-05-24 | Disposition: A | Discharge status: Home / Self Care | Payer: Medicare Other | Source: Ambulatory Visit | Attending: Orthopedic Surgery | Admitting: Orthopedic Surgery

## 2019-05-24 DIAGNOSIS — M5410 Radiculopathy, site unspecified: Secondary | ICD-10-CM

## 2019-05-24 DIAGNOSIS — M48061 Spinal stenosis, lumbar region without neurogenic claudication: Secondary | ICD-10-CM | POA: Diagnosis not present

## 2019-05-27 DIAGNOSIS — M545 Low back pain: Secondary | ICD-10-CM | POA: Diagnosis not present

## 2019-05-27 DIAGNOSIS — M2560 Stiffness of unspecified joint, not elsewhere classified: Secondary | ICD-10-CM | POA: Diagnosis not present

## 2019-05-27 DIAGNOSIS — R262 Difficulty in walking, not elsewhere classified: Secondary | ICD-10-CM | POA: Diagnosis not present

## 2019-05-27 DIAGNOSIS — M6281 Muscle weakness (generalized): Secondary | ICD-10-CM | POA: Diagnosis not present

## 2019-05-29 DIAGNOSIS — R262 Difficulty in walking, not elsewhere classified: Secondary | ICD-10-CM | POA: Diagnosis not present

## 2019-05-29 DIAGNOSIS — M2560 Stiffness of unspecified joint, not elsewhere classified: Secondary | ICD-10-CM | POA: Diagnosis not present

## 2019-05-29 DIAGNOSIS — M6281 Muscle weakness (generalized): Secondary | ICD-10-CM | POA: Diagnosis not present

## 2019-05-29 DIAGNOSIS — M545 Low back pain: Secondary | ICD-10-CM | POA: Diagnosis not present

## 2019-06-02 ENCOUNTER — Telehealth: Payer: Self-pay | Admitting: Orthopedic Surgery

## 2019-06-02 NOTE — Telephone Encounter (Signed)
IMPRESSION: 1. Progressive diffuse disc bulge at L2-3 now results in mild canal stenosis. 2. Degenerative disc disease at L3-4 now results in moderate right and mild left foraminal stenosis and narrowing of the bilateral subarticular recesses.

## 2019-06-02 NOTE — Telephone Encounter (Signed)
Patient called regarding results of MRI of lumbar spine done 05/24/19 at [facility]. Last office note indicates Dr will call patient with results. Ph# [phone]

## 2019-06-03 ENCOUNTER — Other Ambulatory Visit: Payer: Self-pay | Admitting: Orthopedic Surgery

## 2019-06-03 ENCOUNTER — Telehealth: Payer: Self-pay | Admitting: Radiology

## 2019-06-03 DIAGNOSIS — M5410 Radiculopathy, site unspecified: Secondary | ICD-10-CM

## 2019-06-03 DIAGNOSIS — M6281 Muscle weakness (generalized): Secondary | ICD-10-CM | POA: Diagnosis not present

## 2019-06-03 DIAGNOSIS — R262 Difficulty in walking, not elsewhere classified: Secondary | ICD-10-CM | POA: Diagnosis not present

## 2019-06-03 DIAGNOSIS — M545 Low back pain: Secondary | ICD-10-CM | POA: Diagnosis not present

## 2019-06-03 DIAGNOSIS — M2560 Stiffness of unspecified joint, not elsewhere classified: Secondary | ICD-10-CM | POA: Diagnosis not present

## 2019-06-03 NOTE — Telephone Encounter (Signed)
Put in orders

## 2019-06-03 NOTE — Telephone Encounter (Signed)
-----   Message from Carole Civil, MD sent at 06/03/2019  9:14 AM EDT ----- ESI L 3-4

## 2019-06-03 NOTE — Telephone Encounter (Signed)
IMPRESSION: 1. Progressive diffuse disc bulge at L2-3 now results in mild canal stenosis. 2. Degenerative disc disease at L3-4 now results in moderate right and mild left foraminal stenosis and narrowing of the bilateral subarticular recesses.   Electronically Signed   By: Davina Poke M.D.   On: 05/25/2019 09:39   Rec: esi L3-4

## 2019-06-06 DIAGNOSIS — M545 Low back pain: Secondary | ICD-10-CM | POA: Diagnosis not present

## 2019-06-06 DIAGNOSIS — M2560 Stiffness of unspecified joint, not elsewhere classified: Secondary | ICD-10-CM | POA: Diagnosis not present

## 2019-06-06 DIAGNOSIS — R262 Difficulty in walking, not elsewhere classified: Secondary | ICD-10-CM | POA: Diagnosis not present

## 2019-06-06 DIAGNOSIS — M6281 Muscle weakness (generalized): Secondary | ICD-10-CM | POA: Diagnosis not present

## 2019-06-10 ENCOUNTER — Other Ambulatory Visit: Payer: Self-pay | Admitting: Orthopedic Surgery

## 2019-06-10 ENCOUNTER — Ambulatory Visit
Admission: RE | Admit: 2019-06-10 | Discharge: 2019-06-10 | Disposition: A | Discharge status: Home / Self Care | Payer: Medicare Other | Source: Ambulatory Visit | Attending: Orthopedic Surgery | Admitting: Orthopedic Surgery

## 2019-06-10 ENCOUNTER — Other Ambulatory Visit: Payer: Self-pay

## 2019-06-10 DIAGNOSIS — M5126 Other intervertebral disc displacement, lumbar region: Secondary | ICD-10-CM | POA: Diagnosis not present

## 2019-06-10 DIAGNOSIS — M5410 Radiculopathy, site unspecified: Secondary | ICD-10-CM

## 2019-06-10 MED ORDER — METHYLPREDNISOLONE ACETATE 40 MG/ML INJ SUSP (RADIOLOG
120.0000 mg | Freq: Once | INTRAMUSCULAR | Status: AC
  Administered 2019-06-10: 120 mg via EPIDURAL

## 2019-06-10 MED ORDER — IOPAMIDOL (ISOVUE-M 200) INJECTION 41%
1.0000 mL | Freq: Once | INTRAMUSCULAR | Status: AC
  Administered 2019-06-10: 1 mL via EPIDURAL

## 2019-06-10 NOTE — Discharge Instructions (Signed)

## 2019-06-11 DIAGNOSIS — M6281 Muscle weakness (generalized): Secondary | ICD-10-CM | POA: Diagnosis not present

## 2019-06-11 DIAGNOSIS — R262 Difficulty in walking, not elsewhere classified: Secondary | ICD-10-CM | POA: Diagnosis not present

## 2019-06-11 DIAGNOSIS — M545 Low back pain: Secondary | ICD-10-CM | POA: Diagnosis not present

## 2019-06-11 DIAGNOSIS — M2560 Stiffness of unspecified joint, not elsewhere classified: Secondary | ICD-10-CM | POA: Diagnosis not present

## 2019-06-12 DIAGNOSIS — M6281 Muscle weakness (generalized): Secondary | ICD-10-CM | POA: Diagnosis not present

## 2019-06-12 DIAGNOSIS — M545 Low back pain: Secondary | ICD-10-CM | POA: Diagnosis not present

## 2019-06-12 DIAGNOSIS — R262 Difficulty in walking, not elsewhere classified: Secondary | ICD-10-CM | POA: Diagnosis not present

## 2019-06-12 DIAGNOSIS — M2560 Stiffness of unspecified joint, not elsewhere classified: Secondary | ICD-10-CM | POA: Diagnosis not present

## 2019-06-16 DIAGNOSIS — M6281 Muscle weakness (generalized): Secondary | ICD-10-CM | POA: Diagnosis not present

## 2019-06-16 DIAGNOSIS — M2560 Stiffness of unspecified joint, not elsewhere classified: Secondary | ICD-10-CM | POA: Diagnosis not present

## 2019-06-16 DIAGNOSIS — R262 Difficulty in walking, not elsewhere classified: Secondary | ICD-10-CM | POA: Diagnosis not present

## 2019-06-16 DIAGNOSIS — M545 Low back pain: Secondary | ICD-10-CM | POA: Diagnosis not present

## 2019-06-18 DIAGNOSIS — M6281 Muscle weakness (generalized): Secondary | ICD-10-CM | POA: Diagnosis not present

## 2019-06-18 DIAGNOSIS — M545 Low back pain: Secondary | ICD-10-CM | POA: Diagnosis not present

## 2019-06-18 DIAGNOSIS — R262 Difficulty in walking, not elsewhere classified: Secondary | ICD-10-CM | POA: Diagnosis not present

## 2019-06-18 DIAGNOSIS — M2560 Stiffness of unspecified joint, not elsewhere classified: Secondary | ICD-10-CM | POA: Diagnosis not present

## 2019-06-19 ENCOUNTER — Other Ambulatory Visit (HOSPITAL_COMMUNITY): Payer: Self-pay | Admitting: Family Medicine

## 2019-06-19 ENCOUNTER — Ambulatory Visit (HOSPITAL_COMMUNITY): Payer: Medicare Other

## 2019-06-19 DIAGNOSIS — Z1231 Encounter for screening mammogram for malignant neoplasm of breast: Secondary | ICD-10-CM

## 2019-07-01 ENCOUNTER — Other Ambulatory Visit: Payer: Self-pay | Admitting: Orthopedic Surgery

## 2019-07-01 ENCOUNTER — Other Ambulatory Visit: Payer: Self-pay

## 2019-07-01 DIAGNOSIS — Z20822 Contact with and (suspected) exposure to covid-19: Secondary | ICD-10-CM

## 2019-07-01 MED ORDER — IBUPROFEN 800 MG PO TABS: 800.0000 mg | ORAL_TABLET | Freq: Three times a day (TID) | ORAL | 5 refills | Status: DC | PRN

## 2019-07-01 NOTE — Telephone Encounter (Signed)
Patient came to office, requests medication: Ibuprofen 800mg  tablets - - last prescribed by Dr Aline Brochure 05/10/15 - states this helped her pain "running down her leg" more than other medications: [facility]acy: [facility].   - states completed physical therapy, as per recent reports received from [place]. Also had steroid injection done. Aware to call if needs another appointment prior to her yearly in March of 2021.

## 2019-07-03 LAB — NOVEL CORONAVIRUS, NAA: SARS-CoV-2, NAA: NOT DETECTED

## 2019-07-10 DIAGNOSIS — R413 Other amnesia: Secondary | ICD-10-CM | POA: Diagnosis not present

## 2019-07-10 DIAGNOSIS — I10 Essential (primary) hypertension: Secondary | ICD-10-CM | POA: Diagnosis not present

## 2019-07-24 ENCOUNTER — Encounter: Payer: Self-pay | Admitting: Psychology

## 2019-08-13 DIAGNOSIS — E1122 Type 2 diabetes mellitus with diabetic chronic kidney disease: Secondary | ICD-10-CM | POA: Diagnosis not present

## 2019-08-13 DIAGNOSIS — J449 Chronic obstructive pulmonary disease, unspecified: Secondary | ICD-10-CM | POA: Diagnosis not present

## 2019-08-13 DIAGNOSIS — I10 Essential (primary) hypertension: Secondary | ICD-10-CM | POA: Diagnosis not present

## 2019-08-13 DIAGNOSIS — Z7189 Other specified counseling: Secondary | ICD-10-CM | POA: Diagnosis not present

## 2019-08-18 ENCOUNTER — Telehealth: Payer: Self-pay | Admitting: Orthopedic Surgery

## 2019-08-18 DIAGNOSIS — M5410 Radiculopathy, site unspecified: Secondary | ICD-10-CM

## 2019-08-18 NOTE — Telephone Encounter (Signed)
I don't understand your question if she has back pain, she needs to come in for another appointment, or have another injection, is that the question? Why are you asking about blood pressure? We don't treat that.  I will send message to Dr Aline Brochure to see if he wants to see her or order another ESI.

## 2019-08-18 NOTE — Telephone Encounter (Signed)
Patient called to inquire about how long it has been since her epidural, and about her next appointment, which is in March of 2021. States that about 2 weeks after her last epidural (which she said 'really hurt that time'), she has been having more pain on the left leg, radiating down to the top of her left knee. States has a walker but that it's been difficult to walk. States that her blood pressure is also elevated, and that she is to see her primary care, Dr [name], this Wednesday, 08/20/19. Please advise if to address these issues with Dr Berdine Addison, or to schedule appointment here. [phone].

## 2019-08-18 NOTE — Telephone Encounter (Signed)
Refer to neurosurgery I ve done all I can

## 2019-08-18 NOTE — Telephone Encounter (Signed)
Per Dr Aline Brochure as noted.

## 2019-08-18 NOTE — Telephone Encounter (Signed)
Left message for patient to call back so we can advise her she is being referred to Neurosurgery, they will call her with an appointment

## 2019-08-20 ENCOUNTER — Other Ambulatory Visit: Payer: Self-pay | Admitting: Nurse Practitioner

## 2019-08-20 DIAGNOSIS — E119 Type 2 diabetes mellitus without complications: Secondary | ICD-10-CM | POA: Diagnosis not present

## 2019-08-20 DIAGNOSIS — R413 Other amnesia: Secondary | ICD-10-CM | POA: Diagnosis not present

## 2019-08-20 DIAGNOSIS — I10 Essential (primary) hypertension: Secondary | ICD-10-CM | POA: Diagnosis not present

## 2019-08-20 DIAGNOSIS — M545 Low back pain: Secondary | ICD-10-CM | POA: Diagnosis not present

## 2019-08-20 DIAGNOSIS — M542 Cervicalgia: Secondary | ICD-10-CM | POA: Diagnosis not present

## 2019-08-21 DIAGNOSIS — M5416 Radiculopathy, lumbar region: Secondary | ICD-10-CM | POA: Diagnosis not present

## 2019-08-28 ENCOUNTER — Ambulatory Visit
Admission: RE | Admit: 2019-08-28 | Discharge: 2019-08-28 | Disposition: A | Discharge status: Home / Self Care | Payer: Medicare Other | Source: Ambulatory Visit | Attending: Nurse Practitioner | Admitting: Nurse Practitioner

## 2019-08-28 DIAGNOSIS — R413 Other amnesia: Secondary | ICD-10-CM

## 2019-09-02 DIAGNOSIS — M5416 Radiculopathy, lumbar region: Secondary | ICD-10-CM | POA: Diagnosis not present

## 2019-09-05 DIAGNOSIS — M5126 Other intervertebral disc displacement, lumbar region: Secondary | ICD-10-CM | POA: Diagnosis not present

## 2019-09-05 DIAGNOSIS — M5116 Intervertebral disc disorders with radiculopathy, lumbar region: Secondary | ICD-10-CM | POA: Diagnosis not present

## 2019-09-05 DIAGNOSIS — M5416 Radiculopathy, lumbar region: Secondary | ICD-10-CM | POA: Diagnosis not present

## 2019-09-08 ENCOUNTER — Ambulatory Visit (HOSPITAL_COMMUNITY): Payer: Medicare Other

## 2019-09-24 DIAGNOSIS — I10 Essential (primary) hypertension: Secondary | ICD-10-CM | POA: Diagnosis not present

## 2019-09-24 DIAGNOSIS — M545 Low back pain: Secondary | ICD-10-CM | POA: Diagnosis not present

## 2019-09-24 DIAGNOSIS — E1165 Type 2 diabetes mellitus with hyperglycemia: Secondary | ICD-10-CM | POA: Diagnosis not present

## 2019-09-24 DIAGNOSIS — N6001 Solitary cyst of right breast: Secondary | ICD-10-CM | POA: Diagnosis not present

## 2019-10-06 ENCOUNTER — Ambulatory Visit (HOSPITAL_COMMUNITY)
Admission: RE | Admit: 2019-10-06 | Discharge: 2019-10-06 | Disposition: A | Discharge status: Home / Self Care | Payer: Medicare Other | Source: Ambulatory Visit | Attending: Family Medicine | Admitting: Family Medicine

## 2019-10-06 DIAGNOSIS — Z1231 Encounter for screening mammogram for malignant neoplasm of breast: Secondary | ICD-10-CM | POA: Insufficient documentation

## 2019-10-08 DIAGNOSIS — I119 Hypertensive heart disease without heart failure: Secondary | ICD-10-CM | POA: Diagnosis not present

## 2019-10-08 DIAGNOSIS — E1169 Type 2 diabetes mellitus with other specified complication: Secondary | ICD-10-CM | POA: Diagnosis not present

## 2019-10-08 DIAGNOSIS — Z7189 Other specified counseling: Secondary | ICD-10-CM | POA: Diagnosis not present

## 2019-10-08 DIAGNOSIS — R413 Other amnesia: Secondary | ICD-10-CM | POA: Diagnosis not present

## 2019-11-11 DIAGNOSIS — E1169 Type 2 diabetes mellitus with other specified complication: Secondary | ICD-10-CM | POA: Diagnosis not present

## 2019-11-11 DIAGNOSIS — E785 Hyperlipidemia, unspecified: Secondary | ICD-10-CM | POA: Diagnosis not present

## 2019-11-11 DIAGNOSIS — I10 Essential (primary) hypertension: Secondary | ICD-10-CM | POA: Diagnosis not present

## 2019-11-11 DIAGNOSIS — J449 Chronic obstructive pulmonary disease, unspecified: Secondary | ICD-10-CM | POA: Diagnosis not present

## 2019-11-15 ENCOUNTER — Other Ambulatory Visit: Payer: Self-pay | Admitting: Cardiology

## 2019-11-18 DIAGNOSIS — E785 Hyperlipidemia, unspecified: Secondary | ICD-10-CM | POA: Diagnosis not present

## 2019-11-18 DIAGNOSIS — E1165 Type 2 diabetes mellitus with hyperglycemia: Secondary | ICD-10-CM | POA: Diagnosis not present

## 2019-11-18 DIAGNOSIS — I10 Essential (primary) hypertension: Secondary | ICD-10-CM | POA: Diagnosis not present

## 2019-11-18 DIAGNOSIS — M159 Polyosteoarthritis, unspecified: Secondary | ICD-10-CM | POA: Diagnosis not present

## 2019-11-19 ENCOUNTER — Other Ambulatory Visit: Payer: Self-pay | Admitting: Cardiology

## 2019-11-26 ENCOUNTER — Ambulatory Visit: Payer: Self-pay | Admitting: Orthopedic Surgery

## 2019-11-26 DIAGNOSIS — M5416 Radiculopathy, lumbar region: Secondary | ICD-10-CM | POA: Diagnosis not present

## 2019-11-27 ENCOUNTER — Encounter: Payer: Medicare Other | Attending: Psychology | Admitting: Psychology

## 2019-12-10 ENCOUNTER — Other Ambulatory Visit: Payer: Self-pay | Admitting: Cardiology

## 2019-12-11 DIAGNOSIS — M5416 Radiculopathy, lumbar region: Secondary | ICD-10-CM | POA: Diagnosis not present

## 2019-12-11 DIAGNOSIS — M5442 Lumbago with sciatica, left side: Secondary | ICD-10-CM | POA: Diagnosis not present

## 2019-12-15 ENCOUNTER — Ambulatory Visit: Payer: Medicare Other

## 2019-12-15 ENCOUNTER — Ambulatory Visit: Payer: Medicare Other | Admitting: Orthopedic Surgery

## 2019-12-15 ENCOUNTER — Other Ambulatory Visit: Payer: Self-pay

## 2019-12-15 VITALS — Temp 97.5°F | Ht 65.0 in | Wt 211.0 lb

## 2019-12-15 DIAGNOSIS — M1612 Unilateral primary osteoarthritis, left hip: Secondary | ICD-10-CM

## 2019-12-15 DIAGNOSIS — Z96642 Presence of left artificial hip joint: Secondary | ICD-10-CM

## 2019-12-15 NOTE — Progress Notes (Signed)
Chief Complaint  Patient presents with  . Follow-up    Recheck on left hip, DOS 12-08-15   S/p lumbar surgery   C/o frequent falls   xrays prosthesis is stable  Physical Exam She has an abductor lurch.  She has a history of lumbar disc disease as well she had back surgery 2.  Her hip flexion is normal passively but she does have some weakness in hip flexion and abduction  Follow-up in a year for x-ray  Encounter Diagnoses  Name Primary?  . 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 Arthritis of left hip Yes

## 2019-12-15 NOTE — Patient Instructions (Signed)
Follow-up in a year for x-ray of the hip, feel free to cancel the appointment if you are not having any problems with your hip

## 2019-12-23 DIAGNOSIS — E785 Hyperlipidemia, unspecified: Secondary | ICD-10-CM | POA: Diagnosis not present

## 2019-12-23 DIAGNOSIS — Z720 Tobacco use: Secondary | ICD-10-CM | POA: Diagnosis not present

## 2019-12-23 DIAGNOSIS — E1169 Type 2 diabetes mellitus with other specified complication: Secondary | ICD-10-CM | POA: Diagnosis not present

## 2019-12-23 DIAGNOSIS — I10 Essential (primary) hypertension: Secondary | ICD-10-CM | POA: Diagnosis not present

## 2020-01-12 ENCOUNTER — Other Ambulatory Visit: Payer: Self-pay | Admitting: Orthopedic Surgery

## 2020-01-16 DIAGNOSIS — E785 Hyperlipidemia, unspecified: Secondary | ICD-10-CM | POA: Diagnosis not present

## 2020-01-16 DIAGNOSIS — E1165 Type 2 diabetes mellitus with hyperglycemia: Secondary | ICD-10-CM | POA: Diagnosis not present

## 2020-01-16 DIAGNOSIS — M159 Polyosteoarthritis, unspecified: Secondary | ICD-10-CM | POA: Diagnosis not present

## 2020-01-16 DIAGNOSIS — I10 Essential (primary) hypertension: Secondary | ICD-10-CM | POA: Diagnosis not present

## 2020-01-19 DIAGNOSIS — J449 Chronic obstructive pulmonary disease, unspecified: Secondary | ICD-10-CM | POA: Diagnosis not present

## 2020-01-19 DIAGNOSIS — Z7189 Other specified counseling: Secondary | ICD-10-CM | POA: Diagnosis not present

## 2020-01-19 DIAGNOSIS — Z720 Tobacco use: Secondary | ICD-10-CM | POA: Diagnosis not present

## 2020-01-19 DIAGNOSIS — I10 Essential (primary) hypertension: Secondary | ICD-10-CM | POA: Diagnosis not present

## 2020-01-19 DIAGNOSIS — E1169 Type 2 diabetes mellitus with other specified complication: Secondary | ICD-10-CM | POA: Diagnosis not present

## 2020-02-17 DIAGNOSIS — E785 Hyperlipidemia, unspecified: Secondary | ICD-10-CM | POA: Diagnosis not present

## 2020-02-17 DIAGNOSIS — R413 Other amnesia: Secondary | ICD-10-CM | POA: Diagnosis not present

## 2020-02-17 DIAGNOSIS — I129 Hypertensive chronic kidney disease with stage 1 through stage 4 chronic kidney disease, or unspecified chronic kidney disease: Secondary | ICD-10-CM | POA: Diagnosis not present

## 2020-02-17 DIAGNOSIS — M545 Low back pain: Secondary | ICD-10-CM | POA: Diagnosis not present

## 2020-02-17 DIAGNOSIS — E1165 Type 2 diabetes mellitus with hyperglycemia: Secondary | ICD-10-CM | POA: Diagnosis not present

## 2020-04-07 DIAGNOSIS — Z79899 Other long term (current) drug therapy: Secondary | ICD-10-CM | POA: Diagnosis not present

## 2020-04-07 DIAGNOSIS — E559 Vitamin D deficiency, unspecified: Secondary | ICD-10-CM | POA: Diagnosis not present

## 2020-04-07 DIAGNOSIS — G3184 Mild cognitive impairment, so stated: Secondary | ICD-10-CM | POA: Diagnosis not present

## 2020-04-07 DIAGNOSIS — R5383 Other fatigue: Secondary | ICD-10-CM | POA: Diagnosis not present

## 2020-05-18 DIAGNOSIS — E113393 Type 2 diabetes mellitus with moderate nonproliferative diabetic retinopathy without macular edema, bilateral: Secondary | ICD-10-CM | POA: Diagnosis not present

## 2020-05-18 DIAGNOSIS — E785 Hyperlipidemia, unspecified: Secondary | ICD-10-CM | POA: Diagnosis not present

## 2020-05-18 DIAGNOSIS — M159 Polyosteoarthritis, unspecified: Secondary | ICD-10-CM | POA: Diagnosis not present

## 2020-05-18 DIAGNOSIS — H524 Presbyopia: Secondary | ICD-10-CM | POA: Diagnosis not present

## 2020-05-18 DIAGNOSIS — Z961 Presence of intraocular lens: Secondary | ICD-10-CM | POA: Diagnosis not present

## 2020-05-18 DIAGNOSIS — I10 Essential (primary) hypertension: Secondary | ICD-10-CM | POA: Diagnosis not present

## 2020-05-18 DIAGNOSIS — E1165 Type 2 diabetes mellitus with hyperglycemia: Secondary | ICD-10-CM | POA: Diagnosis not present

## 2020-05-19 DIAGNOSIS — G3184 Mild cognitive impairment, so stated: Secondary | ICD-10-CM | POA: Diagnosis not present

## 2020-05-19 DIAGNOSIS — Z720 Tobacco use: Secondary | ICD-10-CM | POA: Diagnosis not present

## 2020-05-20 DIAGNOSIS — E118 Type 2 diabetes mellitus with unspecified complications: Secondary | ICD-10-CM | POA: Diagnosis not present

## 2020-05-20 DIAGNOSIS — E785 Hyperlipidemia, unspecified: Secondary | ICD-10-CM | POA: Diagnosis not present

## 2020-05-20 DIAGNOSIS — Z0000 Encounter for general adult medical examination without abnormal findings: Secondary | ICD-10-CM | POA: Diagnosis not present

## 2020-05-20 DIAGNOSIS — I10 Essential (primary) hypertension: Secondary | ICD-10-CM | POA: Diagnosis not present

## 2020-05-20 DIAGNOSIS — Z794 Long term (current) use of insulin: Secondary | ICD-10-CM | POA: Diagnosis not present

## 2020-05-20 DIAGNOSIS — Z720 Tobacco use: Secondary | ICD-10-CM | POA: Diagnosis not present

## 2020-05-20 DIAGNOSIS — I129 Hypertensive chronic kidney disease with stage 1 through stage 4 chronic kidney disease, or unspecified chronic kidney disease: Secondary | ICD-10-CM | POA: Diagnosis not present

## 2020-05-20 DIAGNOSIS — E1169 Type 2 diabetes mellitus with other specified complication: Secondary | ICD-10-CM | POA: Diagnosis not present

## 2020-05-27 DIAGNOSIS — R799 Abnormal finding of blood chemistry, unspecified: Secondary | ICD-10-CM | POA: Diagnosis not present

## 2020-06-10 ENCOUNTER — Encounter (INDEPENDENT_AMBULATORY_CARE_PROVIDER_SITE_OTHER): Payer: Medicare Other | Admitting: Ophthalmology

## 2020-06-10 DIAGNOSIS — Z794 Long term (current) use of insulin: Secondary | ICD-10-CM | POA: Diagnosis not present

## 2020-06-10 DIAGNOSIS — I10 Essential (primary) hypertension: Secondary | ICD-10-CM | POA: Diagnosis not present

## 2020-06-10 DIAGNOSIS — I129 Hypertensive chronic kidney disease with stage 1 through stage 4 chronic kidney disease, or unspecified chronic kidney disease: Secondary | ICD-10-CM | POA: Diagnosis not present

## 2020-06-10 DIAGNOSIS — Z720 Tobacco use: Secondary | ICD-10-CM | POA: Diagnosis not present

## 2020-06-14 DIAGNOSIS — R413 Other amnesia: Secondary | ICD-10-CM | POA: Diagnosis not present

## 2020-06-14 DIAGNOSIS — Z794 Long term (current) use of insulin: Secondary | ICD-10-CM | POA: Diagnosis not present

## 2020-06-14 DIAGNOSIS — I10 Essential (primary) hypertension: Secondary | ICD-10-CM | POA: Diagnosis not present

## 2020-06-14 DIAGNOSIS — M159 Polyosteoarthritis, unspecified: Secondary | ICD-10-CM | POA: Diagnosis not present

## 2020-06-16 DIAGNOSIS — I10 Essential (primary) hypertension: Secondary | ICD-10-CM | POA: Diagnosis not present

## 2020-06-16 DIAGNOSIS — E1122 Type 2 diabetes mellitus with diabetic chronic kidney disease: Secondary | ICD-10-CM | POA: Diagnosis not present

## 2020-06-16 DIAGNOSIS — Z7189 Other specified counseling: Secondary | ICD-10-CM | POA: Diagnosis not present

## 2020-06-16 DIAGNOSIS — Z720 Tobacco use: Secondary | ICD-10-CM | POA: Diagnosis not present

## 2020-06-16 DIAGNOSIS — E876 Hypokalemia: Secondary | ICD-10-CM | POA: Diagnosis not present

## 2020-06-17 ENCOUNTER — Encounter (INDEPENDENT_AMBULATORY_CARE_PROVIDER_SITE_OTHER): Payer: Medicare Other | Admitting: Ophthalmology

## 2020-06-17 DIAGNOSIS — M159 Polyosteoarthritis, unspecified: Secondary | ICD-10-CM | POA: Diagnosis not present

## 2020-06-17 DIAGNOSIS — E1165 Type 2 diabetes mellitus with hyperglycemia: Secondary | ICD-10-CM | POA: Diagnosis not present

## 2020-06-17 DIAGNOSIS — I10 Essential (primary) hypertension: Secondary | ICD-10-CM | POA: Diagnosis not present

## 2020-06-17 DIAGNOSIS — E785 Hyperlipidemia, unspecified: Secondary | ICD-10-CM | POA: Diagnosis not present

## 2020-07-07 DIAGNOSIS — Z720 Tobacco use: Secondary | ICD-10-CM | POA: Diagnosis not present

## 2020-07-07 DIAGNOSIS — I10 Essential (primary) hypertension: Secondary | ICD-10-CM | POA: Diagnosis not present

## 2020-07-07 DIAGNOSIS — Z794 Long term (current) use of insulin: Secondary | ICD-10-CM | POA: Diagnosis not present

## 2020-07-07 DIAGNOSIS — M159 Polyosteoarthritis, unspecified: Secondary | ICD-10-CM | POA: Diagnosis not present

## 2020-07-07 DIAGNOSIS — Z7189 Other specified counseling: Secondary | ICD-10-CM | POA: Diagnosis not present

## 2020-07-17 DIAGNOSIS — I10 Essential (primary) hypertension: Secondary | ICD-10-CM | POA: Diagnosis not present

## 2020-07-17 DIAGNOSIS — E785 Hyperlipidemia, unspecified: Secondary | ICD-10-CM | POA: Diagnosis not present

## 2020-07-17 DIAGNOSIS — E1165 Type 2 diabetes mellitus with hyperglycemia: Secondary | ICD-10-CM | POA: Diagnosis not present

## 2020-07-17 DIAGNOSIS — M159 Polyosteoarthritis, unspecified: Secondary | ICD-10-CM | POA: Diagnosis not present

## 2020-07-19 ENCOUNTER — Ambulatory Visit (INDEPENDENT_AMBULATORY_CARE_PROVIDER_SITE_OTHER): Payer: Medicare Other | Admitting: Ophthalmology

## 2020-07-19 ENCOUNTER — Other Ambulatory Visit: Payer: Self-pay

## 2020-07-19 ENCOUNTER — Encounter (INDEPENDENT_AMBULATORY_CARE_PROVIDER_SITE_OTHER): Payer: Self-pay | Admitting: Ophthalmology

## 2020-07-19 DIAGNOSIS — H43813 Vitreous degeneration, bilateral: Secondary | ICD-10-CM

## 2020-07-19 DIAGNOSIS — E113393 Type 2 diabetes mellitus with moderate nonproliferative diabetic retinopathy without macular edema, bilateral: Secondary | ICD-10-CM | POA: Insufficient documentation

## 2020-07-19 NOTE — Progress Notes (Signed)
07/19/2020     CHIEF COMPLAINT Patient presents for Diabetic Eye Exam   HISTORY OF PRESENT ILLNESS: [name] is a 69 y.o. female who presents to the clinic today for:   HPI    Diabetic Eye Exam    Vision is stable.  Associated Symptoms Negative for Flashes and Floaters.  Diabetes characteristics include Type 2.  Blood sugar level is controlled.  I, the attending physician,  performed the HPI with the patient and updated documentation appropriately.          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 Denies FOL and floaters. BGL: did not check A1C: unknown       Last edited by Tilda Franco on 07/19/2020  9:36 AM. (History)      Referring physician: [name], MD [address] [place],  [place] [id number]  HISTORICAL INFORMATION:   Selected notes from the MEDICAL RECORD NUMBER    Lab Results  Component Value Date   HGBA1C 8.7 (H) 07/15/2018     CURRENT MEDICATIONS: No current outpatient medications on file. (Ophthalmic Drugs)   No current facility-administered medications for this visit. (Ophthalmic Drugs)   Current Outpatient Medications (Other)  Medication Sig  . aspirin EC 81 MG tablet Take 81 mg by mouth daily.  Marland Kitchen atenolol (TENORMIN) 25 MG tablet Take 1 tablet (25 mg total) by mouth daily.  . chlorthalidone (HYGROTON) 25 MG tablet TAKE 1 TABLET BY MOUTH  DAILY  . gabapentin (NEURONTIN) 100 MG capsule TAKE 1 CAPSULE BY MOUTH THREE TIMES DAILY  . hydrALAZINE (APRESOLINE) 25 MG tablet Take 25 mg by mouth 3 (three) times daily.  Marland Kitchen ibuprofen (ADVIL) 800 MG tablet Take 1 tablet (800 mg total) by mouth every 8 (eight) hours as needed.  . insulin degludec (TRESIBA FLEXTOUCH) 100 UNIT/ML SOPN FlexTouch Pen Inject 18 Units into the skin at bedtime.  Marland Kitchen lisinopril (PRINIVIL,ZESTRIL) 10 MG tablet Take 10 mg by mouth every evening.   . meloxicam (MOBIC) 15 MG tablet Take 15 mg by mouth daily.  .  rosuvastatin (CRESTOR) 5 MG tablet   . traMADol (ULTRAM) 50 MG tablet Take 1 tablet (50 mg total) by mouth every 6 (six) hours as needed.  . triamterene-hydrochlorothiazide (DYAZIDE) 37.5-25 MG capsule Take 1 capsule by mouth daily.  . verapamil (CALAN-SR) 240 MG CR tablet Take 240 mg by mouth daily.    No current facility-administered medications for this visit. (Other)      REVIEW OF SYSTEMS: ROS    Positive for: Endocrine   Last edited by Tilda Franco on 07/19/2020  9:36 AM. (History)       ALLERGIES Allergies  Allergen Reactions  . Gabapentin Other (See Comments)    suicidal thoughts    PAST MEDICAL HISTORY Past Medical History:  Diagnosis Date  . Cancer ([place])    ovarian  . Carpal tunnel syndrome    Nocturnal and positional, bilateral  . COPD (chronic obstructive pulmonary disease) ([place])   . Degenerative joint disease   . Diabetes mellitus    Insulin-dependent diabetes  . Hypercholesteremia   . Hypertension    Past Surgical History:  Procedure Laterality Date  . ABDOMINAL HYSTERECTOMY     BSO  . ABDOMINAL SURGERY     for bleeding after hysterectomy  . CATARACT EXTRACTION W/PHACO Left 07/19/2018   Procedure: CATARACT EXTRACTION PHACO AND INTRAOCULAR LENS PLACEMENT (IOC)  LEFT CDE:3.26;  Surgeon: Baruch Goldmann, MD;  Location: [facility];  Service: Ophthalmology;  Laterality: Left;  . CATARACT EXTRACTION W/PHACO Right 08/23/2018   Procedure: CATARACT EXTRACTION PHACO AND INTRAOCULAR LENS PLACEMENT RIGHT EYE;  Surgeon: Baruch Goldmann, MD;  Location: [facility];  Service: Ophthalmology;  Laterality: Right;  right  . CHOLECYSTECTOMY    . COLONOSCOPY  2007   Dr. Gala Romney: internal hemorrhoids, few scattered sigmoid diverticula  . COLONOSCOPY N/A 04/09/2014   [id number] diverticulosis. Redundant colon. Single colonic polyp-removed as described above.  . ESOPHAGOGASTRODUODENOSCOPY  2008   Dr. Gala Romney: erosive esophagitis, patulous EG junction  . ESOPHAGOGASTRODUODENOSCOPY N/A  04/09/2014   [id number] reflux esophagitis. Schatzki's ring-status post dilation as described above. Hiatal hernia. Gastricerosions-status post gastric biopsy  . HIP CLOSED REDUCTION Left 01/15/2016   Procedure: CLOSED REDUCTION LEFT HIP PROSTHESIS;  Surgeon: Carole Civil, MD;  Location: [facility];  Service: Orthopedics;  Laterality: Left;  . left hip replacement  12/08/2015  . MALONEY DILATION N/A 04/09/2014   Procedure: Venia Minks DILATION;  Surgeon: Daneil Dolin, MD;  Location: [facility];  Service: Endoscopy;  Laterality: N/A;  . REPLACEMENT TOTAL KNEE     bilateral  . SAVORY DILATION N/A 04/09/2014   Procedure: SAVORY DILATION;  Surgeon: Daneil Dolin, MD;  [facility];  Service: Endoscopy;  Laterality: N/A;  . TOENAIL EXCISION     R great toenail; artificial nail  . TOTAL HIP ARTHROPLASTY Left 12/08/2015   Procedure: TOTAL HIP ARTHROPLASTY;  Surgeon: Carole Civil, MD;  Location: [facility];  Service: Orthopedics;  Laterality: Left;  . TOTAL HIP REVISION Left 01/26/2016   Procedure: LEFT TOTAL HIP REVISION;  Surgeon: Carole Civil, MD;  Location: [facility];  Service: Orthopedics;  Laterality: Left;    FAMILY HISTORY Family History  Problem Relation Age of Onset  . Stroke Mother   . Diabetes Mother   . Diabetes Father   . Cancer Brother        Leukemia and bone cancer  . Colon cancer Neg Hx   . Heart disease Neg Hx     SOCIAL HISTORY Social History   Tobacco Use  . Smoking status: Former Smoker    Packs/day: 1.00    Years: 54.00    Pack years: 54.00    Types: Cigarettes    Quit date: 01/17/2016    Years since quitting: 4.5  . Smokeless tobacco: Never Used  . Tobacco comment: Smoked [phone] up to 1 pack per day; she did stop  9 months during pregnancy  Vaping Use  . Vaping Use: Never used  Substance Use Topics  . Alcohol use: No    Alcohol/week: 0.0 standard drinks  . Drug use: No         OPHTHALMIC EXAM:  Base Eye Exam    Visual Acuity  (Snellen - Linear)      Right Left   Dist [place] 20/20 20/20       Tonometry (Tonopen, 9:44 AM)      Right Left   Pressure 11 10       Pupils      Pupils Dark Light Shape React APD   Right PERRL 3 2 Round Slow None   Left PERRL 3 2 Round Slow None       Visual Fields (Counting fingers)      Left Right    Full Full       Neuro/Psych    Oriented x3: Yes   Mood/Affect:  Normal       Dilation    Both eyes: 1.0% Mydriacyl, 2.5% Phenylephrine @ 9:44 AM        Slit Lamp and Fundus Exam    External Exam      Right Left   External Normal Normal       Slit Lamp Exam      Right Left   Lids/Lashes Normal Normal   Conjunctiva/Sclera White and quiet White and quiet   Cornea Clear Clear   Anterior Chamber Deep and quiet Deep and quiet   Iris Round and reactive Round and reactive   Lens Centered posterior chamber intraocular lens    Anterior Vitreous Normal Normal       Fundus Exam      Right Left   Posterior Vitreous Normal Normal   Disc Normal Normal   C/D Ratio 0.4 0.4   Macula Normal Normal   Vessels NPDR- Moderate NPDR- Moderate   Periphery Normal Normal          IMAGING AND PROCEDURES  Imaging and Procedures for 07/19/20  OCT, Retina - OU - Both Eyes       Right Eye Quality was good. Scan locations included subfoveal. Central Foveal Thickness: 238. Progression has no prior data. Findings include normal foveal contour.   Left Eye Quality was good. Scan locations included subfoveal. Central Foveal Thickness: 229. Progression has no prior data. Findings include normal foveal contour.   Notes Incidental posterior vitreous detachment is noted OU  No active diabetic maculopathy                ASSESSMENT/PLAN:  Moderate nonproliferative diabetic retinopathy of both eyes without macular edema associated with type 2 diabetes mellitus ([place]) The nature of diabetic retinopathy was explained using the following analogy: "Retinopathy develops in the  body's blood supply like salty water corrodes the linings of pipes in a house, until rust appears, then holes in the pipes develop which leak followed by destruction and loss of the pipes as the corrosion turns them to dust. In a similar fashion, Diabetes damages the blood supply of the body by cumulative long--term elevated blood sugar, which corrodes the blood supply in the body, particularly the blood vessels supplying the retina, kidneys, and nerves".  Thus, control of blood sugar, slows the progression of the corrosive effect of diabetes mellitus.         ICD-10-CM   1. Moderate nonproliferative diabetic retinopathy of both eyes without macular edema associated with type 2 diabetes mellitus (HCC)  Y70.6237 OCT, Retina - OU - Both Eyes  2. Posterior vitreous detachment, both eyes  H43.813 OCT, Retina - OU - Both Eyes    1.  Patient understands critical importance of continued blood sugar control monitoring is the best chance to prevent progression of diabetic retinopathy in each eye.  2.  3.  Ophthalmic Meds Ordered this visit:  No orders of the defined types were placed in this encounter.      Return in about 6 months (around 01/16/2021) for DILATE OU, COLOR FP.  Patient Instructions  Diabetic Retinopathy Diabetic retinopathy is a disease of the retina. The retina is a light-sensitive membrane at the back of the eye. Retinopathy is a complication of diabetes (diabetes mellitus) and a common cause of bad eyesight (visual impairment). It can eventually cause blindness. Early detection and treatment of diabetic retinopathy is important in keeping your eyes healthy and preventing further damage to them. What are the causes? Diabetic retinopathy is  caused by blood sugar (glucose) levels that are too high for an extended period of time. High blood glucose over an extended period of time can:  Damage small blood vessels in the retina, allowing blood to leak through the vessel  walls.  Cause new, abnormal blood vessels to grow on the retina. This can scar the retina in the advanced stage of diabetic retinopathy. What increases the risk? You are more likely to develop this condition if:  You have had diabetes for a long time.  You have poorly controlled blood glucose.  You have high blood pressure. What are the signs or symptoms? In the early stages of diabetic retinopathy, there are often no symptoms. As the condition gets worse, symptoms may include:  Blurred vision. This is usually caused by swelling due to abnormal blood glucose levels. The blurriness may go away when blood glucose levels return to normal.  Moving specks or dark spots (floaters) in your vision. These can be caused by a small amount of bleeding (hemorrhage) from retinal blood vessels.  Missing parts of your field of vision, such as vision at the sides of the eyes. This can be caused by larger retinal hemorrhages.  Difficulty reading.  Double vision.  Pain in one or both eyes.  Feeling pressure in one or both eyes.  Trouble seeing straight lines. Straight lines may not look straight.  Redness of the eyes that does not go away. How is this diagnosed? This condition may be diagnosed with an eye exam in which your eye care specialist puts drops in your eyes that enlarge (dilate) your pupils. This lets your health care provider examine your retina and check for changes in your retinal blood vessels. How is this treated? This condition may be treated by:  Keeping your blood glucose and blood pressure within a target range.  Using a type of laser beam to seal your retinal blood vessels. This stops them from bleeding and decreases pressure in your eye.  Getting shots of medicine in the eye to reduce swelling of the center of the retina (macula). You may be given: ? Anti-VEGF medicine. This medicine can help slow vision loss, and may even improve vision. ? Steroid medicine. Follow these  instructions at home:   Follow your diabetes management plan as directed by your health care provider. This may include exercising regularly and eating a healthy diet.  Keep your blood glucose level and your blood pressure in your target range, as directed by your health care provider.  Check your blood glucose as often as directed.  Take over the counter and prescription medicines only as told by your health care provider. This includes insulin and oral diabetes medicine.  Get your eyes checked at least once every year. An eye specialist can usually see diabetic retinopathy developing long before it starts to cause problems. In many cases, it can be treated to prevent complications from occurring.  Do not use any products that contain nicotine or tobacco, such as cigarettes and e-cigarettes. If you need help quitting, ask your health care provider.  Keep all follow-up visits as told by your health care provider. This is important. Contact a health care provider if:  You notice gradual blurring or other changes in your vision over time.  You notice that your glasses or contact lenses do not make things look as sharp as they once did.  You have trouble reading or seeing details at a distance with either eye.  You notice a change in  your vision or notice that parts of your field of vision appear missing or hazy.  You suddenly see moving specks or dark spots in the field of vision of either eye. Get help right away if:  You have sudden pain or pressure in one or both eyes.  You suddenly lose vision or a curtain or veil seems to come across your eyes.  You have a sudden burst of floaters in your vision. Summary  Diabetic retinopathy is a disease of the retina. The retina is a light-sensitive membrane at the back of the eye. Retinopathy is a complication of diabetes.  Get your eyes checked at least once every year. An eye specialist can usually see diabetic retinopathy developing  long before it starts to cause problems. In many cases, it can be treated to prevent complications from occurring.  Keep your blood glucose and your blood pressure in target range. Follow your diabetes management plan as directed by your health care provider.  Protect your eyes. Wear sunglasses and eye protection when needed. This information is not intended to replace advice given to you by your health care provider. Make sure you discuss any questions you have with your health care provider. Document Revised: 10/17/2017 Document Reviewed: 10/09/2016 Elsevier Patient Education  2020 [employer].  The nature of moderate nonproliferative diabetic retinopathy was discussed with the patient as well as the need for more frequent follow up to judge for progression. Good blood glucose, blood pressure, and serum lipid control was recommended as well as avoidance of smoking and maintenance of normal weight.  Close follow up with PCP encouraged.     Explained the diagnoses, plan, and follow up with the patient and they expressed understanding.  Patient expressed understanding of the importance of proper follow up care.   Clent Demark Katianne Barre M.D. Diseases & Surgery of the Retina and Vitreous [facility] 07/19/20     Abbreviations: M myopia (nearsighted); A astigmatism; H hyperopia (farsighted); P presbyopia; Mrx spectacle prescription;  CTL contact lenses; OD right eye; OS left eye; OU both eyes  XT exotropia; ET esotropia; PEK punctate epithelial keratitis; PEE punctate epithelial erosions; DES dry eye syndrome; MGD meibomian gland dysfunction; ATs artificial tears; PFAT's preservative free artificial tears; [place] nuclear sclerotic cataract; PSC posterior subcapsular cataract; ERM epi-retinal membrane; PVD posterior vitreous detachment; RD retinal detachment; DM diabetes mellitus; DR diabetic retinopathy; NPDR non-proliferative diabetic retinopathy; PDR proliferative diabetic retinopathy;  CSME clinically significant macular edema; DME diabetic macular edema; dbh dot blot hemorrhages; CWS cotton wool spot; POAG primary open angle glaucoma; C/D cup-to-disc ratio; HVF humphrey visual field; GVF goldmann visual field; OCT optical coherence tomography; IOP intraocular pressure; BRVO Branch retinal vein occlusion; CRVO central retinal vein occlusion; CRAO central retinal artery occlusion; BRAO branch retinal artery occlusion; RT retinal tear; SB scleral buckle; PPV pars plana vitrectomy; VH Vitreous hemorrhage; PRP panretinal laser photocoagulation; IVK intravitreal kenalog; VMT vitreomacular traction; MH Macular hole;  NVD neovascularization of the disc; NVE neovascularization elsewhere; AREDS age related eye disease study; ARMD age related macular degeneration; POAG primary open angle glaucoma; EBMD epithelial/anterior basement membrane dystrophy; ACIOL anterior chamber intraocular lens; IOL intraocular lens; PCIOL posterior chamber intraocular lens; Phaco/IOL phacoemulsification with intraocular lens placement; [place] photorefractive keratectomy; LASIK laser assisted in situ keratomileusis; HTN hypertension; DM diabetes mellitus; COPD chronic obstructive pulmonary disease

## 2020-07-19 NOTE — Patient Instructions (Signed)
Diabetic Retinopathy Diabetic retinopathy is a disease of the retina. The retina is a light-sensitive membrane at the back of the eye. Retinopathy is a complication of diabetes (diabetes mellitus) and a common cause of bad eyesight (visual impairment). It can eventually cause blindness. Early detection and treatment of diabetic retinopathy is important in keeping your eyes healthy and preventing further damage to them. What are the causes? Diabetic retinopathy is caused by blood sugar (glucose) levels that are too high for an extended period of time. High blood glucose over an extended period of time can:  Damage small blood vessels in the retina, allowing blood to leak through the vessel walls.  Cause new, abnormal blood vessels to grow on the retina. This can scar the retina in the advanced stage of diabetic retinopathy. What increases the risk? You are more likely to develop this condition if:  You have had diabetes for a long time.  You have poorly controlled blood glucose.  You have high blood pressure. What are the signs or symptoms? In the early stages of diabetic retinopathy, there are often no symptoms. As the condition gets worse, symptoms may include:  Blurred vision. This is usually caused by swelling due to abnormal blood glucose levels. The blurriness may go away when blood glucose levels return to normal.  Moving specks or dark spots (floaters) in your vision. These can be caused by a small amount of bleeding (hemorrhage) from retinal blood vessels.  Missing parts of your field of vision, such as vision at the sides of the eyes. This can be caused by larger retinal hemorrhages.  Difficulty reading.  Double vision.  Pain in one or both eyes.  Feeling pressure in one or both eyes.  Trouble seeing straight lines. Straight lines may not look straight.  Redness of the eyes that does not go away. How is this diagnosed? This condition may be diagnosed with an eye exam in  which your eye care specialist puts drops in your eyes that enlarge (dilate) your pupils. This lets your health care provider examine your retina and check for changes in your retinal blood vessels. How is this treated? This condition may be treated by:  Keeping your blood glucose and blood pressure within a target range.  Using a type of laser beam to seal your retinal blood vessels. This stops them from bleeding and decreases pressure in your eye.  Getting shots of medicine in the eye to reduce swelling of the center of the retina (macula). You may be given: ? Anti-VEGF medicine. This medicine can help slow vision loss, and may even improve vision. ? Steroid medicine. Follow these instructions at home:   Follow your diabetes management plan as directed by your health care provider. This may include exercising regularly and eating a healthy diet.  Keep your blood glucose level and your blood pressure in your target range, as directed by your health care provider.  Check your blood glucose as often as directed.  Take over the counter and prescription medicines only as told by your health care provider. This includes insulin and oral diabetes medicine.  Get your eyes checked at least once every year. An eye specialist can usually see diabetic retinopathy developing long before it starts to cause problems. In many cases, it can be treated to prevent complications from occurring.  Do not use any products that contain nicotine or tobacco, such as cigarettes and e-cigarettes. If you need help quitting, ask your health care provider.  Keep all follow-up   visits as told by your health care provider. This is important. Contact a health care provider if:  You notice gradual blurring or other changes in your vision over time.  You notice that your glasses or contact lenses do not make things look as sharp as they once did.  You have trouble reading or seeing details at a distance with either  eye.  You notice a change in your vision or notice that parts of your field of vision appear missing or hazy.  You suddenly see moving specks or dark spots in the field of vision of either eye. Get help right away if:  You have sudden pain or pressure in one or both eyes.  You suddenly lose vision or a curtain or veil seems to come across your eyes.  You have a sudden burst of floaters in your vision. Summary  Diabetic retinopathy is a disease of the retina. The retina is a light-sensitive membrane at the back of the eye. Retinopathy is a complication of diabetes.  Get your eyes checked at least once every year. An eye specialist can usually see diabetic retinopathy developing long before it starts to cause problems. In many cases, it can be treated to prevent complications from occurring.  Keep your blood glucose and your blood pressure in target range. Follow your diabetes management plan as directed by your health care provider.  Protect your eyes. Wear sunglasses and eye protection when needed. This information is not intended to replace advice given to you by your health care provider. Make sure you discuss any questions you have with your health care provider. Document Revised: 10/17/2017 Document Reviewed: 10/09/2016 Elsevier Patient Education  2020 [employer].  The nature of moderate nonproliferative diabetic retinopathy was discussed with the patient as well as the need for more frequent follow up to judge for progression. Good blood glucose, blood pressure, and serum lipid control was recommended as well as avoidance of smoking and maintenance of normal weight.  Close follow up with PCP encouraged.

## 2020-07-19 NOTE — Assessment & Plan Note (Signed)
The nature of diabetic retinopathy was explained using the following analogy: "Retinopathy develops in the body's blood supply like salty water corrodes the linings of pipes in a house, until rust appears, then holes in the pipes develop which leak followed by destruction and loss of the pipes as the corrosion turns them to dust. In a similar fashion, Diabetes damages the blood supply of the body by cumulative long--term elevated blood sugar, which corrodes the blood supply in the body, particularly the blood vessels supplying the retina, kidneys, and nerves".  Thus, control of blood sugar, slows the progression of the corrosive effect of diabetes mellitus.    

## 2020-07-20 ENCOUNTER — Other Ambulatory Visit: Payer: Self-pay | Admitting: Orthopedic Surgery

## 2020-07-20 NOTE — Telephone Encounter (Signed)
Patient requests refill on Ibuprofen 800 mgs.  Qty  90 with 5 refills  Sig: Take 1 tablet (800 mg total) by mouth every 8 (eight) hours as needed.  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

## 2020-07-21 DIAGNOSIS — Z720 Tobacco use: Secondary | ICD-10-CM | POA: Diagnosis not present

## 2020-07-21 DIAGNOSIS — G3184 Mild cognitive impairment, so stated: Secondary | ICD-10-CM | POA: Diagnosis not present

## 2020-07-21 MED ORDER — IBUPROFEN 800 MG PO TABS: 800.0000 mg | ORAL_TABLET | Freq: Three times a day (TID) | ORAL | 5 refills | Status: DC | PRN

## 2020-07-29 DIAGNOSIS — Z20822 Contact with and (suspected) exposure to covid-19: Secondary | ICD-10-CM | POA: Diagnosis not present

## 2020-08-04 DIAGNOSIS — R059 Cough, unspecified: Secondary | ICD-10-CM | POA: Diagnosis not present

## 2020-08-04 DIAGNOSIS — K5900 Constipation, unspecified: Secondary | ICD-10-CM | POA: Diagnosis not present

## 2020-08-04 DIAGNOSIS — J449 Chronic obstructive pulmonary disease, unspecified: Secondary | ICD-10-CM | POA: Diagnosis not present

## 2020-08-04 DIAGNOSIS — R1030 Lower abdominal pain, unspecified: Secondary | ICD-10-CM | POA: Diagnosis not present

## 2020-08-05 ENCOUNTER — Other Ambulatory Visit (HOSPITAL_COMMUNITY): Payer: Self-pay | Admitting: Nurse Practitioner

## 2020-08-05 DIAGNOSIS — K5900 Constipation, unspecified: Secondary | ICD-10-CM

## 2020-08-05 DIAGNOSIS — R059 Cough, unspecified: Secondary | ICD-10-CM

## 2020-08-18 ENCOUNTER — Ambulatory Visit (HOSPITAL_COMMUNITY)
Admission: RE | Admit: 2020-08-18 | Discharge: 2020-08-18 | Disposition: A | Discharge status: Home / Self Care | Payer: Medicare Other | Source: Ambulatory Visit | Attending: Nurse Practitioner | Admitting: Nurse Practitioner

## 2020-08-18 ENCOUNTER — Other Ambulatory Visit: Payer: Self-pay

## 2020-08-18 DIAGNOSIS — Z7189 Other specified counseling: Secondary | ICD-10-CM | POA: Diagnosis not present

## 2020-08-18 DIAGNOSIS — K5900 Constipation, unspecified: Secondary | ICD-10-CM | POA: Insufficient documentation

## 2020-08-18 DIAGNOSIS — R059 Cough, unspecified: Secondary | ICD-10-CM | POA: Diagnosis not present

## 2020-08-18 DIAGNOSIS — I10 Essential (primary) hypertension: Secondary | ICD-10-CM | POA: Diagnosis not present

## 2020-08-18 DIAGNOSIS — I517 Cardiomegaly: Secondary | ICD-10-CM | POA: Diagnosis not present

## 2020-08-18 DIAGNOSIS — M47816 Spondylosis without myelopathy or radiculopathy, lumbar region: Secondary | ICD-10-CM | POA: Diagnosis not present

## 2020-08-18 DIAGNOSIS — E118 Type 2 diabetes mellitus with unspecified complications: Secondary | ICD-10-CM | POA: Diagnosis not present

## 2020-08-18 DIAGNOSIS — I700 Atherosclerosis of aorta: Secondary | ICD-10-CM | POA: Diagnosis not present

## 2020-08-18 DIAGNOSIS — Z794 Long term (current) use of insulin: Secondary | ICD-10-CM | POA: Diagnosis not present

## 2020-08-25 DIAGNOSIS — J449 Chronic obstructive pulmonary disease, unspecified: Secondary | ICD-10-CM | POA: Diagnosis not present

## 2020-08-25 DIAGNOSIS — K5900 Constipation, unspecified: Secondary | ICD-10-CM | POA: Diagnosis not present

## 2020-08-25 DIAGNOSIS — E118 Type 2 diabetes mellitus with unspecified complications: Secondary | ICD-10-CM | POA: Diagnosis not present

## 2020-08-25 DIAGNOSIS — R413 Other amnesia: Secondary | ICD-10-CM | POA: Diagnosis not present

## 2020-09-08 DIAGNOSIS — I10 Essential (primary) hypertension: Secondary | ICD-10-CM | POA: Diagnosis not present

## 2020-09-08 DIAGNOSIS — Z7189 Other specified counseling: Secondary | ICD-10-CM | POA: Diagnosis not present

## 2020-09-17 DIAGNOSIS — I10 Essential (primary) hypertension: Secondary | ICD-10-CM | POA: Diagnosis not present

## 2020-09-17 DIAGNOSIS — M159 Polyosteoarthritis, unspecified: Secondary | ICD-10-CM | POA: Diagnosis not present

## 2020-09-17 DIAGNOSIS — E785 Hyperlipidemia, unspecified: Secondary | ICD-10-CM | POA: Diagnosis not present

## 2020-09-17 DIAGNOSIS — E1165 Type 2 diabetes mellitus with hyperglycemia: Secondary | ICD-10-CM | POA: Diagnosis not present

## 2020-09-21 DIAGNOSIS — E1165 Type 2 diabetes mellitus with hyperglycemia: Secondary | ICD-10-CM | POA: Diagnosis not present

## 2020-09-21 DIAGNOSIS — I10 Essential (primary) hypertension: Secondary | ICD-10-CM | POA: Diagnosis not present

## 2020-09-21 DIAGNOSIS — Z7189 Other specified counseling: Secondary | ICD-10-CM | POA: Diagnosis not present

## 2020-10-06 DIAGNOSIS — Z794 Long term (current) use of insulin: Secondary | ICD-10-CM | POA: Diagnosis not present

## 2020-10-06 DIAGNOSIS — E119 Type 2 diabetes mellitus without complications: Secondary | ICD-10-CM | POA: Diagnosis not present

## 2020-10-06 DIAGNOSIS — I10 Essential (primary) hypertension: Secondary | ICD-10-CM | POA: Diagnosis not present

## 2020-10-06 DIAGNOSIS — R413 Other amnesia: Secondary | ICD-10-CM | POA: Diagnosis not present

## 2020-10-13 DIAGNOSIS — Z720 Tobacco use: Secondary | ICD-10-CM | POA: Diagnosis not present

## 2020-10-13 DIAGNOSIS — G3184 Mild cognitive impairment, so stated: Secondary | ICD-10-CM | POA: Diagnosis not present

## 2020-10-13 DIAGNOSIS — F17210 Nicotine dependence, cigarettes, uncomplicated: Secondary | ICD-10-CM | POA: Diagnosis not present

## 2020-10-20 DIAGNOSIS — R413 Other amnesia: Secondary | ICD-10-CM | POA: Diagnosis not present

## 2020-10-20 DIAGNOSIS — J449 Chronic obstructive pulmonary disease, unspecified: Secondary | ICD-10-CM | POA: Diagnosis not present

## 2020-10-20 DIAGNOSIS — Z7189 Other specified counseling: Secondary | ICD-10-CM | POA: Diagnosis not present

## 2020-10-20 DIAGNOSIS — I10 Essential (primary) hypertension: Secondary | ICD-10-CM | POA: Diagnosis not present

## 2020-10-20 DIAGNOSIS — E118 Type 2 diabetes mellitus with unspecified complications: Secondary | ICD-10-CM | POA: Diagnosis not present

## 2020-11-03 ENCOUNTER — Other Ambulatory Visit: Payer: Self-pay | Admitting: Cardiology

## 2020-11-15 DIAGNOSIS — I10 Essential (primary) hypertension: Secondary | ICD-10-CM | POA: Diagnosis not present

## 2020-11-15 DIAGNOSIS — E785 Hyperlipidemia, unspecified: Secondary | ICD-10-CM | POA: Diagnosis not present

## 2020-11-15 DIAGNOSIS — M159 Polyosteoarthritis, unspecified: Secondary | ICD-10-CM | POA: Diagnosis not present

## 2020-11-15 DIAGNOSIS — E1165 Type 2 diabetes mellitus with hyperglycemia: Secondary | ICD-10-CM | POA: Diagnosis not present

## 2020-11-18 ENCOUNTER — Encounter (INDEPENDENT_AMBULATORY_CARE_PROVIDER_SITE_OTHER): Payer: Self-pay

## 2020-12-02 ENCOUNTER — Encounter (INDEPENDENT_AMBULATORY_CARE_PROVIDER_SITE_OTHER): Payer: Self-pay

## 2020-12-02 DIAGNOSIS — Z961 Presence of intraocular lens: Secondary | ICD-10-CM | POA: Diagnosis not present

## 2020-12-02 DIAGNOSIS — E103393 Type 1 diabetes mellitus with moderate nonproliferative diabetic retinopathy without macular edema, bilateral: Secondary | ICD-10-CM | POA: Diagnosis not present

## 2020-12-02 DIAGNOSIS — H524 Presbyopia: Secondary | ICD-10-CM | POA: Diagnosis not present

## 2020-12-02 DIAGNOSIS — Z0100 Encounter for examination of eyes and vision without abnormal findings: Secondary | ICD-10-CM | POA: Diagnosis not present

## 2020-12-13 ENCOUNTER — Encounter: Payer: Self-pay | Admitting: Orthopedic Surgery

## 2020-12-13 ENCOUNTER — Ambulatory Visit: Payer: Medicare HMO | Admitting: Orthopedic Surgery

## 2020-12-13 ENCOUNTER — Other Ambulatory Visit: Payer: Self-pay

## 2020-12-13 ENCOUNTER — Ambulatory Visit: Payer: Medicare HMO

## 2020-12-13 VITALS — BP 230/103 | HR 58 | Ht 65.0 in | Wt 188.1 lb

## 2020-12-13 DIAGNOSIS — Z96642 Presence of left artificial hip joint: Secondary | ICD-10-CM

## 2020-12-13 DIAGNOSIS — M1612 Unilateral primary osteoarthritis, left hip: Secondary | ICD-10-CM | POA: Diagnosis not present

## 2020-12-13 NOTE — Progress Notes (Signed)
Chief Complaint  Patient presents with  . Hip Pain    L/ doing good   Encounter Diagnoses  Name Primary?  Marland Kitchen Arthritis of left hip Yes  . 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  No medicines no interventions, low risk

## 2020-12-16 DIAGNOSIS — E785 Hyperlipidemia, unspecified: Secondary | ICD-10-CM | POA: Diagnosis not present

## 2020-12-16 DIAGNOSIS — E1165 Type 2 diabetes mellitus with hyperglycemia: Secondary | ICD-10-CM | POA: Diagnosis not present

## 2020-12-16 DIAGNOSIS — I10 Essential (primary) hypertension: Secondary | ICD-10-CM | POA: Diagnosis not present

## 2021-01-06 DIAGNOSIS — N184 Chronic kidney disease, stage 4 (severe): Secondary | ICD-10-CM | POA: Diagnosis not present

## 2021-01-06 DIAGNOSIS — R69 Illness, unspecified: Secondary | ICD-10-CM | POA: Diagnosis not present

## 2021-01-08 ENCOUNTER — Emergency Department (HOSPITAL_COMMUNITY): Payer: Medicare HMO

## 2021-01-08 ENCOUNTER — Inpatient Hospital Stay (HOSPITAL_COMMUNITY)
Admission: EM | Admit: 2021-01-08 | Discharge: 2021-01-21 | DRG: 533 | Disposition: A | Discharge status: Skilled Nursing Facility | Payer: Medicare HMO | Attending: Internal Medicine | Admitting: Internal Medicine

## 2021-01-08 ENCOUNTER — Other Ambulatory Visit: Payer: Self-pay

## 2021-01-08 DIAGNOSIS — F0390 Unspecified dementia without behavioral disturbance: Secondary | ICD-10-CM

## 2021-01-08 DIAGNOSIS — Z20822 Contact with and (suspected) exposure to covid-19: Secondary | ICD-10-CM | POA: Diagnosis present

## 2021-01-08 DIAGNOSIS — F32A Depression, unspecified: Secondary | ICD-10-CM | POA: Diagnosis present

## 2021-01-08 DIAGNOSIS — E1122 Type 2 diabetes mellitus with diabetic chronic kidney disease: Secondary | ICD-10-CM | POA: Diagnosis present

## 2021-01-08 DIAGNOSIS — Z720 Tobacco use: Secondary | ICD-10-CM

## 2021-01-08 DIAGNOSIS — Y9389 Activity, other specified: Secondary | ICD-10-CM

## 2021-01-08 DIAGNOSIS — E119 Type 2 diabetes mellitus without complications: Secondary | ICD-10-CM | POA: Diagnosis not present

## 2021-01-08 DIAGNOSIS — M19011 Primary osteoarthritis, right shoulder: Secondary | ICD-10-CM | POA: Diagnosis not present

## 2021-01-08 DIAGNOSIS — S7291XA Unspecified fracture of right femur, initial encounter for closed fracture: Secondary | ICD-10-CM | POA: Diagnosis present

## 2021-01-08 DIAGNOSIS — Y92009 Unspecified place in unspecified non-institutional (private) residence as the place of occurrence of the external cause: Secondary | ICD-10-CM | POA: Diagnosis not present

## 2021-01-08 DIAGNOSIS — Y92017 Garden or yard in single-family (private) house as the place of occurrence of the external cause: Secondary | ICD-10-CM

## 2021-01-08 DIAGNOSIS — E7800 Pure hypercholesterolemia, unspecified: Secondary | ICD-10-CM | POA: Diagnosis present

## 2021-01-08 DIAGNOSIS — N390 Urinary tract infection, site not specified: Secondary | ICD-10-CM | POA: Diagnosis present

## 2021-01-08 DIAGNOSIS — E113393 Type 2 diabetes mellitus with moderate nonproliferative diabetic retinopathy without macular edema, bilateral: Secondary | ICD-10-CM | POA: Diagnosis present

## 2021-01-08 DIAGNOSIS — Z96642 Presence of left artificial hip joint: Secondary | ICD-10-CM

## 2021-01-08 DIAGNOSIS — D631 Anemia in chronic kidney disease: Secondary | ICD-10-CM | POA: Diagnosis present

## 2021-01-08 DIAGNOSIS — N184 Chronic kidney disease, stage 4 (severe): Secondary | ICD-10-CM | POA: Diagnosis present

## 2021-01-08 DIAGNOSIS — M25559 Pain in unspecified hip: Secondary | ICD-10-CM | POA: Diagnosis not present

## 2021-01-08 DIAGNOSIS — R001 Bradycardia, unspecified: Secondary | ICD-10-CM | POA: Diagnosis present

## 2021-01-08 DIAGNOSIS — T383X6A Underdosing of insulin and oral hypoglycemic [antidiabetic] drugs, initial encounter: Secondary | ICD-10-CM | POA: Diagnosis present

## 2021-01-08 DIAGNOSIS — S42411A Displaced simple supracondylar fracture without intercondylar fracture of right humerus, initial encounter for closed fracture: Secondary | ICD-10-CM | POA: Diagnosis not present

## 2021-01-08 DIAGNOSIS — M9711XA Periprosthetic fracture around internal prosthetic right knee joint, initial encounter: Secondary | ICD-10-CM | POA: Diagnosis present

## 2021-01-08 DIAGNOSIS — M25562 Pain in left knee: Secondary | ICD-10-CM | POA: Diagnosis not present

## 2021-01-08 DIAGNOSIS — E876 Hypokalemia: Secondary | ICD-10-CM | POA: Diagnosis not present

## 2021-01-08 DIAGNOSIS — Z66 Do not resuscitate: Secondary | ICD-10-CM | POA: Diagnosis not present

## 2021-01-08 DIAGNOSIS — R296 Repeated falls: Secondary | ICD-10-CM | POA: Diagnosis present

## 2021-01-08 DIAGNOSIS — K219 Gastro-esophageal reflux disease without esophagitis: Secondary | ICD-10-CM | POA: Diagnosis present

## 2021-01-08 DIAGNOSIS — Z6831 Body mass index (BMI) 31.0-31.9, adult: Secondary | ICD-10-CM | POA: Diagnosis not present

## 2021-01-08 DIAGNOSIS — J449 Chronic obstructive pulmonary disease, unspecified: Secondary | ICD-10-CM | POA: Diagnosis not present

## 2021-01-08 DIAGNOSIS — S42474A Nondisplaced transcondylar fracture of right humerus, initial encounter for closed fracture: Secondary | ICD-10-CM | POA: Diagnosis not present

## 2021-01-08 DIAGNOSIS — M978XXA Periprosthetic fracture around other internal prosthetic joint, initial encounter: Secondary | ICD-10-CM | POA: Diagnosis not present

## 2021-01-08 DIAGNOSIS — E871 Hypo-osmolality and hyponatremia: Secondary | ICD-10-CM | POA: Diagnosis present

## 2021-01-08 DIAGNOSIS — E669 Obesity, unspecified: Secondary | ICD-10-CM | POA: Diagnosis not present

## 2021-01-08 DIAGNOSIS — M25561 Pain in right knee: Secondary | ICD-10-CM | POA: Diagnosis not present

## 2021-01-08 DIAGNOSIS — G9341 Metabolic encephalopathy: Secondary | ICD-10-CM | POA: Diagnosis not present

## 2021-01-08 DIAGNOSIS — Z91138 Patient's unintentional underdosing of medication regimen for other reason: Secondary | ICD-10-CM

## 2021-01-08 DIAGNOSIS — Z96651 Presence of right artificial knee joint: Secondary | ICD-10-CM | POA: Diagnosis not present

## 2021-01-08 DIAGNOSIS — N179 Acute kidney failure, unspecified: Secondary | ICD-10-CM | POA: Diagnosis not present

## 2021-01-08 DIAGNOSIS — Z806 Family history of leukemia: Secondary | ICD-10-CM

## 2021-01-08 DIAGNOSIS — M1990 Unspecified osteoarthritis, unspecified site: Secondary | ICD-10-CM | POA: Diagnosis present

## 2021-01-08 DIAGNOSIS — I10 Essential (primary) hypertension: Secondary | ICD-10-CM | POA: Diagnosis not present

## 2021-01-08 DIAGNOSIS — Z794 Long term (current) use of insulin: Secondary | ICD-10-CM

## 2021-01-08 DIAGNOSIS — S72461A Displaced supracondylar fracture with intracondylar extension of lower end of right femur, initial encounter for closed fracture: Secondary | ICD-10-CM | POA: Diagnosis not present

## 2021-01-08 DIAGNOSIS — W19XXXA Unspecified fall, initial encounter: Secondary | ICD-10-CM

## 2021-01-08 DIAGNOSIS — Z87891 Personal history of nicotine dependence: Secondary | ICD-10-CM

## 2021-01-08 DIAGNOSIS — Z96652 Presence of left artificial knee joint: Secondary | ICD-10-CM | POA: Diagnosis present

## 2021-01-08 DIAGNOSIS — E66811 Obesity, class 1: Secondary | ICD-10-CM

## 2021-01-08 DIAGNOSIS — E785 Hyperlipidemia, unspecified: Secondary | ICD-10-CM | POA: Diagnosis present

## 2021-01-08 DIAGNOSIS — E1165 Type 2 diabetes mellitus with hyperglycemia: Secondary | ICD-10-CM | POA: Diagnosis present

## 2021-01-08 DIAGNOSIS — Z886 Allergy status to analgesic agent status: Secondary | ICD-10-CM

## 2021-01-08 DIAGNOSIS — E782 Mixed hyperlipidemia: Secondary | ICD-10-CM

## 2021-01-08 DIAGNOSIS — Z90710 Acquired absence of both cervix and uterus: Secondary | ICD-10-CM

## 2021-01-08 DIAGNOSIS — E860 Dehydration: Secondary | ICD-10-CM | POA: Diagnosis present

## 2021-01-08 DIAGNOSIS — Z79899 Other long term (current) drug therapy: Secondary | ICD-10-CM

## 2021-01-08 DIAGNOSIS — Z823 Family history of stroke: Secondary | ICD-10-CM

## 2021-01-08 DIAGNOSIS — S42301A Unspecified fracture of shaft of humerus, right arm, initial encounter for closed fracture: Secondary | ICD-10-CM | POA: Diagnosis present

## 2021-01-08 DIAGNOSIS — M47812 Spondylosis without myelopathy or radiculopathy, cervical region: Secondary | ICD-10-CM | POA: Diagnosis not present

## 2021-01-08 DIAGNOSIS — I160 Hypertensive urgency: Secondary | ICD-10-CM | POA: Diagnosis present

## 2021-01-08 DIAGNOSIS — D649 Anemia, unspecified: Secondary | ICD-10-CM | POA: Diagnosis present

## 2021-01-08 DIAGNOSIS — M25531 Pain in right wrist: Secondary | ICD-10-CM | POA: Diagnosis not present

## 2021-01-08 DIAGNOSIS — I6782 Cerebral ischemia: Secondary | ICD-10-CM | POA: Diagnosis not present

## 2021-01-08 DIAGNOSIS — S72464A Nondisplaced supracondylar fracture with intracondylar extension of lower end of right femur, initial encounter for closed fracture: Secondary | ICD-10-CM

## 2021-01-08 DIAGNOSIS — Z7982 Long term (current) use of aspirin: Secondary | ICD-10-CM

## 2021-01-08 DIAGNOSIS — S72401A Unspecified fracture of lower end of right femur, initial encounter for closed fracture: Secondary | ICD-10-CM

## 2021-01-08 DIAGNOSIS — R69 Illness, unspecified: Secondary | ICD-10-CM | POA: Diagnosis not present

## 2021-01-08 DIAGNOSIS — M79603 Pain in arm, unspecified: Secondary | ICD-10-CM | POA: Diagnosis not present

## 2021-01-08 DIAGNOSIS — I2510 Atherosclerotic heart disease of native coronary artery without angina pectoris: Secondary | ICD-10-CM | POA: Diagnosis present

## 2021-01-08 DIAGNOSIS — Z833 Family history of diabetes mellitus: Secondary | ICD-10-CM

## 2021-01-08 DIAGNOSIS — Z888 Allergy status to other drugs, medicaments and biological substances status: Secondary | ICD-10-CM

## 2021-01-08 DIAGNOSIS — S42391K Other fracture of shaft of right humerus, subsequent encounter for fracture with nonunion: Secondary | ICD-10-CM | POA: Diagnosis not present

## 2021-01-08 DIAGNOSIS — I517 Cardiomegaly: Secondary | ICD-10-CM | POA: Diagnosis not present

## 2021-01-08 DIAGNOSIS — Z791 Long term (current) use of non-steroidal anti-inflammatories (NSAID): Secondary | ICD-10-CM

## 2021-01-08 DIAGNOSIS — Z043 Encounter for examination and observation following other accident: Secondary | ICD-10-CM | POA: Diagnosis not present

## 2021-01-08 DIAGNOSIS — R5381 Other malaise: Secondary | ICD-10-CM | POA: Diagnosis present

## 2021-01-08 DIAGNOSIS — I129 Hypertensive chronic kidney disease with stage 1 through stage 4 chronic kidney disease, or unspecified chronic kidney disease: Secondary | ICD-10-CM | POA: Diagnosis present

## 2021-01-08 LAB — CBC WITH DIFFERENTIAL/PLATELET
Abs Immature Granulocytes: 0.04 10*3/uL (ref 0.00–0.07)
Basophils Absolute: 0 10*3/uL (ref 0.0–0.1)
Basophils Relative: 0 %
Eosinophils Absolute: 0.1 10*3/uL (ref 0.0–0.5)
Eosinophils Relative: 1 %
HCT: 32.2 % — ABNORMAL LOW (ref 36.0–46.0)
Hemoglobin: 10.8 g/dL — ABNORMAL LOW (ref 12.0–15.0)
Immature Granulocytes: 0 %
Lymphocytes Relative: 13 %
Lymphs Abs: 1.2 10*3/uL (ref 0.7–4.0)
MCH: 30.9 pg (ref 26.0–34.0)
MCHC: 33.5 g/dL (ref 30.0–36.0)
MCV: 92.3 fL (ref 80.0–100.0)
Monocytes Absolute: 0.7 10*3/uL (ref 0.1–1.0)
Monocytes Relative: 8 %
Neutro Abs: 7 10*3/uL (ref 1.7–7.7)
Neutrophils Relative %: 78 %
Platelets: 269 10*3/uL (ref 150–400)
RBC: 3.49 MIL/uL — ABNORMAL LOW (ref 3.87–5.11)
RDW: 13 % (ref 11.5–15.5)
WBC: 9 10*3/uL (ref 4.0–10.5)
nRBC: 0 % (ref 0.0–0.2)

## 2021-01-08 LAB — BASIC METABOLIC PANEL
Anion gap: 10 (ref 5–15)
BUN: 34 mg/dL — ABNORMAL HIGH (ref 8–23)
CO2: 23 mmol/L (ref 22–32)
Calcium: 8.1 mg/dL — ABNORMAL LOW (ref 8.9–10.3)
Chloride: 101 mmol/L (ref 98–111)
Creatinine, Ser: 2.86 mg/dL — ABNORMAL HIGH (ref 0.44–1.00)
GFR, Estimated: 17 mL/min — ABNORMAL LOW (ref >60)
Glucose, Bld: 121 mg/dL — ABNORMAL HIGH (ref 70–99)
Potassium: 2.3 mmol/L — CL (ref 3.5–5.1)
Sodium: 134 mmol/L — ABNORMAL LOW (ref 135–145)

## 2021-01-08 LAB — URINALYSIS, ROUTINE W REFLEX MICROSCOPIC
Bilirubin Urine: NEGATIVE
Glucose, UA: 50 mg/dL — AB
Ketones, ur: NEGATIVE mg/dL
Leukocytes,Ua: NEGATIVE
Nitrite: NEGATIVE
Protein, ur: >=300 mg/dL — AB
Specific Gravity, Urine: 1.013 (ref 1.005–1.030)
pH: 5 (ref 5.0–8.0)

## 2021-01-08 LAB — RESP PANEL BY RT-PCR (FLU A&B, COVID) ARPGX2
Influenza A by PCR: NEGATIVE
Influenza B by PCR: NEGATIVE
SARS Coronavirus 2 by RT PCR: NEGATIVE

## 2021-01-08 LAB — MAGNESIUM: Magnesium: 1.3 mg/dL — ABNORMAL LOW (ref 1.7–2.4)

## 2021-01-08 MED ORDER — ROSUVASTATIN CALCIUM 10 MG PO TABS
5.0000 mg | ORAL_TABLET | Freq: Every day | ORAL | Status: DC
  Administered 2021-01-09 – 2021-01-21 (×13): 5 mg via ORAL
  Filled 2021-01-08 (×13): qty 1

## 2021-01-08 MED ORDER — VERAPAMIL HCL ER 240 MG PO TBCR
240.0000 mg | EXTENDED_RELEASE_TABLET | Freq: Every day | ORAL | Status: DC
  Administered 2021-01-09: 240 mg via ORAL
  Filled 2021-01-08 (×2): qty 1

## 2021-01-08 MED ORDER — SERTRALINE HCL 50 MG PO TABS
50.0000 mg | ORAL_TABLET | Freq: Every day | ORAL | Status: DC
  Administered 2021-01-09 – 2021-01-16 (×8): 50 mg via ORAL
  Filled 2021-01-08 (×8): qty 1

## 2021-01-08 MED ORDER — POTASSIUM CHLORIDE CRYS ER 20 MEQ PO TBCR
40.0000 meq | EXTENDED_RELEASE_TABLET | Freq: Once | ORAL | Status: AC
  Administered 2021-01-08: 40 meq via ORAL
  Filled 2021-01-08: qty 2

## 2021-01-08 MED ORDER — HYDROCODONE-ACETAMINOPHEN 5-325 MG PO TABS
1.0000 | ORAL_TABLET | Freq: Once | ORAL | Status: AC
  Administered 2021-01-08: 1 via ORAL
  Filled 2021-01-08: qty 1

## 2021-01-08 MED ORDER — MORPHINE SULFATE (PF) 2 MG/ML IV SOLN
2.0000 mg | INTRAVENOUS | Status: DC | PRN
  Administered 2021-01-08 – 2021-01-14 (×8): 2 mg via INTRAVENOUS
  Filled 2021-01-08 (×8): qty 1

## 2021-01-08 MED ORDER — SODIUM CHLORIDE 0.9 % IV BOLUS
1000.0000 mL | Freq: Once | INTRAVENOUS | Status: AC
  Administered 2021-01-08: 1000 mL via INTRAVENOUS

## 2021-01-08 MED ORDER — HYDRALAZINE HCL 20 MG/ML IJ SOLN
10.0000 mg | Freq: Once | INTRAMUSCULAR | Status: AC | PRN
  Administered 2021-01-08: 10 mg via INTRAVENOUS
  Filled 2021-01-08: qty 1

## 2021-01-08 MED ORDER — NICOTINE 21 MG/24HR TD PT24
21.0000 mg | MEDICATED_PATCH | Freq: Every day | TRANSDERMAL | Status: DC
  Administered 2021-01-08 – 2021-01-21 (×14): 21 mg via TRANSDERMAL
  Filled 2021-01-08 (×14): qty 1

## 2021-01-08 MED ORDER — DONEPEZIL HCL 5 MG PO TABS
5.0000 mg | ORAL_TABLET | Freq: Every day | ORAL | Status: DC
  Administered 2021-01-09 – 2021-01-21 (×13): 5 mg via ORAL
  Filled 2021-01-08 (×13): qty 1

## 2021-01-08 MED ORDER — SODIUM CHLORIDE 0.9 % IV SOLN: Freq: Once | INTRAVENOUS | Status: AC

## 2021-01-08 MED ORDER — POTASSIUM CHLORIDE 10 MEQ/100ML IV SOLN
10.0000 meq | INTRAVENOUS | Status: AC
  Administered 2021-01-08 – 2021-01-09 (×3): 10 meq via INTRAVENOUS
  Filled 2021-01-08 (×2): qty 100

## 2021-01-08 MED ORDER — MAGNESIUM SULFATE 2 GM/50ML IV SOLN
2.0000 g | Freq: Once | INTRAVENOUS | Status: AC
  Administered 2021-01-08: 2 g via INTRAVENOUS
  Filled 2021-01-08: qty 50

## 2021-01-08 MED ORDER — INSULIN ASPART 100 UNIT/ML ~~LOC~~ SOLN
0.0000 [IU] | SUBCUTANEOUS | Status: DC
  Administered 2021-01-09 – 2021-01-10 (×4): 1 [IU] via SUBCUTANEOUS
  Administered 2021-01-10: 2 [IU] via SUBCUTANEOUS
  Administered 2021-01-11 – 2021-01-15 (×7): 1 [IU] via SUBCUTANEOUS
  Administered 2021-01-15: 3 [IU] via SUBCUTANEOUS
  Administered 2021-01-15: 1 [IU] via SUBCUTANEOUS
  Administered 2021-01-15 – 2021-01-16 (×2): 2 [IU] via SUBCUTANEOUS
  Administered 2021-01-16: 1 [IU] via SUBCUTANEOUS
  Administered 2021-01-16 (×2): 2 [IU] via SUBCUTANEOUS
  Administered 2021-01-16 – 2021-01-17 (×2): 1 [IU] via SUBCUTANEOUS
  Administered 2021-01-17 – 2021-01-18 (×3): 2 [IU] via SUBCUTANEOUS
  Administered 2021-01-19: 1 [IU] via SUBCUTANEOUS
  Administered 2021-01-19: 3 [IU] via SUBCUTANEOUS
  Administered 2021-01-20: 2 [IU] via SUBCUTANEOUS

## 2021-01-08 MED ORDER — ACETAMINOPHEN 325 MG PO TABS
650.0000 mg | ORAL_TABLET | Freq: Four times a day (QID) | ORAL | Status: DC | PRN
  Administered 2021-01-10 – 2021-01-18 (×2): 650 mg via ORAL
  Filled 2021-01-08 (×2): qty 2

## 2021-01-08 MED ORDER — HYDRALAZINE HCL 25 MG PO TABS
25.0000 mg | ORAL_TABLET | Freq: Three times a day (TID) | ORAL | Status: DC
  Administered 2021-01-08 – 2021-01-09 (×2): 25 mg via ORAL
  Filled 2021-01-08 (×2): qty 1

## 2021-01-08 NOTE — ED Notes (Signed)
Pt out of room to radiology.

## 2021-01-08 NOTE — Progress Notes (Signed)
Patient ID: [name], female   DOB: [birth year], 70 y.o.   MRN: [id number]  Past Medical History:  Diagnosis Date  . Cancer ([facility])    ovarian  . Carpal tunnel syndrome    Nocturnal and positional, bilateral  . COPD (chronic obstructive pulmonary disease) ([place])   . Degenerative joint disease   . Diabetes mellitus    Insulin-dependent diabetes  . Hypercholesteremia   . Hypertension    CAD and bradycardia 2019: cardiology stress test EF56% , low risk study f/b Dr Harl Bowie  RIGHT KNEE: TKA, 2005// [facility] FIXED BEARING  PS

## 2021-01-08 NOTE — ED Triage Notes (Signed)
Pt fell today outside, c/o of right arm, right shoulder, right knee and right hip. Unknown if she hit her head

## 2021-01-08 NOTE — ED Notes (Signed)
Pt back to room from radiology. VS updated. Pt updated on POC. Medicated for pain per MAR. No additional needs or questions at this time. Call bell in reach. Stretcher low and locked.

## 2021-01-08 NOTE — H&P (Addendum)
History and Physical  [name] UXL:[id number] DOB: [birth year] DOA: 01/08/2021  Referring physician: Isla Pence, MD PCP: [name], MD  Patient coming from: Home  Chief Complaint: Fall at home  HPI: [name] is a 70 y.o. female with medical history significant for hypertension, hyperlipidemia, T2DM, dementia and obesity who presents to the emergency department after sustaining a fall at home prior to arrival.  History was obtained from ED physician and son at bedside.  Per son, patient lives alone, but son checks in on her regularly, he took away her car keys due to dementia, a neighbor saw her fall while trying to get into locked car and landed on her right side.  She complained of right arm, right knee and right hip pain.  She was unable to get up after the fall, EMS was activated and patient was sent to the ED for further evaluation and management.  ED Course:  In the emergency department, she was bradycardic with HR at 49-51 bpm, BP at 160/80, but other vital signs were within normal range.  Work-up in the ED showed normocytic anemia, hypokalemia, BUN/creatinine 34/2.86 (creatinine for comparison was in October 2019 at 1.44).  Influenza A, B and SARS coronavirus 2 was negative. Right knee x-ray showed comminuted fracture of the distal lateral and medial femur Right elbow x-ray showed supracondylar distal humeral fracture with a joint effusion Right shoulder x-ray showed no acute fracture or subluxation Right wrist x-ray showed no evidence of fracture or dislocation CT head without contrast showed no acute intracranial abnormality and no skull fracture She was treated with Norco, potassium was replenished and IV hydration was provided. Dr. Aline Brochure was consulted by ED physician and he recommended admitting the patient here at [facility] pending working on finding an accepting orthopedist in [place] per ED physician and ED medical record. Hospitalist was asked to admit  patient for further evaluation and management.   Review of Systems: Constitutional: Negative for chills and fever.  HENT: Negative for ear pain and sore throat.   Eyes: Negative for pain and visual disturbance.  Respiratory: Negative for cough, chest tightness and shortness of breath.   Cardiovascular: Negative for chest pain and palpitations.  Gastrointestinal: Negative for abdominal pain and vomiting.  Endocrine: Negative for polyphagia and polyuria.  Genitourinary: Negative for decreased urine volume, dysuria, enuresis Musculoskeletal: Positive for right elbow, shoulder, wrist, hip and knee pain as well as left knee pain. Skin: Negative for color change and rash.  Allergic/Immunologic: Negative for immunocompromised state.  Neurological: Negative for tremors, syncope, speech difficulty and headaches. Hematological:  Does not bruise easily All other systems reviewed and are negative   Past Medical History:  Diagnosis Date  . Cancer ([place])    ovarian  . Carpal tunnel syndrome    Nocturnal and positional, bilateral  . COPD (chronic obstructive pulmonary disease) ([place])   . Degenerative joint disease   . Diabetes mellitus    Insulin-dependent diabetes  . Hypercholesteremia   . Hypertension    Past Surgical History:  Procedure Laterality Date  . ABDOMINAL HYSTERECTOMY     BSO  . ABDOMINAL SURGERY     for bleeding after hysterectomy  . CATARACT EXTRACTION W/PHACO Left 07/19/2018   Procedure: CATARACT EXTRACTION PHACO AND INTRAOCULAR LENS PLACEMENT (IOC) LEFT CDE:3.26;  Surgeon: Baruch Goldmann, MD;  Location: [facility];  Service: Ophthalmology;  Laterality: Left;  . CATARACT EXTRACTION W/PHACO Right 08/23/2018   Procedure: CATARACT EXTRACTION PHACO AND INTRAOCULAR LENS PLACEMENT RIGHT EYE;  Surgeon:  Baruch Goldmann, MD;  Location: [facility];  Service: Ophthalmology;  Laterality: Right;  right  . CHOLECYSTECTOMY    . COLONOSCOPY  2007   Dr. Gala Romney: internal hemorrhoids, few scattered  sigmoid diverticula  . COLONOSCOPY N/A 04/09/2014   GYJ:EHUDJSH diverticulosis. Redundant colon. Single colonic polyp-removed as described above.  . ESOPHAGOGASTRODUODENOSCOPY  2008   Dr. Gala Romney: erosive esophagitis, patulous EG junction  . ESOPHAGOGASTRODUODENOSCOPY N/A 04/09/2014   FWY:OVZCHYI reflux esophagitis. Schatzki's ring-status post dilation as described above. Hiatal hernia. Gastricerosions-status post gastric biopsy  . HIP CLOSED REDUCTION Left 01/15/2016   Procedure: CLOSED REDUCTION LEFT HIP PROSTHESIS;  Surgeon: Carole Civil, MD;  Location: [facility];  Service: Orthopedics;  Laterality: Left;  . left hip replacement  12/08/2015  . MALONEY DILATION N/A 04/09/2014   Procedure: Venia Minks DILATION;  Surgeon: Daneil Dolin, MD;  Location: [facility];  Service: Endoscopy;  Laterality: N/A;  . REPLACEMENT TOTAL KNEE     bilateral  . SAVORY DILATION N/A 04/09/2014   Procedure: SAVORY DILATION;  Surgeon: Daneil Dolin, MD;  Location: [facility];  Service: Endoscopy;  Laterality: N/A;  . TOENAIL EXCISION     R great toenail; artificial nail  . TOTAL HIP ARTHROPLASTY Left 12/08/2015   Procedure: TOTAL HIP ARTHROPLASTY;  Surgeon: Carole Civil, MD;  Location: [facility];  Service: Orthopedics;  Laterality: Left;  . TOTAL HIP REVISION Left 01/26/2016   Procedure: LEFT TOTAL HIP REVISION;  Surgeon: Carole Civil, MD;  Location: [facility];  Service: Orthopedics;  Laterality: Left;    Social History:  reports that she quit smoking about 4 years ago. Her smoking use included cigarettes. She has a 54.00 pack-year smoking history. She has never used smokeless tobacco. She reports that she does not drink alcohol and does not use drugs.   Allergies  Allergen Reactions  . Gabapentin Other (See Comments)    suicidal thoughts  . Ibuprofen Other (See Comments)    Kidney problems per caregiver    Family History  Problem Relation Age of Onset  . Stroke Mother   . Diabetes Mother   .  Diabetes Father   . Cancer Brother        Leukemia and bone cancer  . Colon cancer Neg Hx   . Heart disease Neg Hx      Prior to Admission medications   Medication Sig Start Date End Date Taking? Authorizing Provider  aspirin EC 81 MG tablet Take 81 mg by mouth daily.    [provider]  atenolol (TENORMIN) 25 MG tablet Take 1 tablet (25 mg total) by mouth daily. 06/17/18 04/07/19  Arnoldo Lenis, MD  chlorthalidone (HYGROTON) 25 MG tablet TAKE 1 TABLET BY MOUTH  DAILY 11/04/20   Arnoldo Lenis, MD  gabapentin (NEURONTIN) 100 MG capsule TAKE 1 CAPSULE BY MOUTH THREE TIMES DAILY 01/12/20   Carole Civil, MD  hydrALAZINE (APRESOLINE) 25 MG tablet Take 25 mg by mouth 3 (three) times daily.    [provider]  ibuprofen (ADVIL) 800 MG tablet Take 1 tablet (800 mg total) by mouth every 8 (eight) hours as needed. 07/21/20   Carole Civil, MD  insulin degludec (TRESIBA) 100 UNIT/ML FlexTouch Pen Inject 18 Units into the skin at bedtime.    [provider]  lisinopril (PRINIVIL,ZESTRIL) 10 MG tablet Take 10 mg by mouth every evening.     [provider]  meloxicam (MOBIC) 15 MG tablet Take 15 mg by mouth daily.  05/12/19   [provider]  rosuvastatin (CRESTOR) 5 MG tablet  10/10/18   [provider]  traMADol (ULTRAM) 50 MG tablet Take 1 tablet (50 mg total) by mouth every 6 (six) hours as needed. 04/07/19   Carole Civil, MD  triamterene-hydrochlorothiazide (DYAZIDE) 37.5-25 MG capsule Take 1 capsule by mouth daily. 01/03/16   [provider]  verapamil (CALAN-SR) 240 MG CR tablet Take 240 mg by mouth daily.  01/03/16   [provider]    Physical Exam: BP (!) 185/76   Pulse (!) 50   Temp 98.7 F (37.1 C) (Oral)   Resp 20   Ht 5\' 5"  (1.651 m)   Wt 85.3 kg   SpO2 92%   BMI 31.28 kg/m   . General: 70 y.o. year-old female well developed well nourished in no acute distress.  Alert and oriented  x3. Marland Kitchen HEENT: NCAT, EOMI . Neck: Supple, trachea medial . Cardiovascular: Regular rate and rhythm with no rubs or gallops.  No thyromegaly or JVD noted.  No lower extremity edema. 2/4 pulses in all 4 extremities. Marland Kitchen Respiratory: Clear to auscultation with no wheezes or rales. Good inspiratory effort. . Abdomen: Soft nontender nondistended with normal bowel sounds x4 quadrants. . Muskuloskeletal: Tender to palpation of right shoulder, right elbow and wrist, right hip, right and left knees.  No cyanosis, clubbing or edema noted bilaterally . Neuro: CN II-XII intact, sensation, reflexes intact . Skin: No ulcerative lesions noted or rashes . Psychiatry: Mood is appropriate for condition and setting          Labs on Admission:  Basic Metabolic Panel: Recent Labs  Lab 01/08/21 1834  NA 134*  K 2.3*  CL 101  CO2 23  GLUCOSE 121*  BUN 34*  CREATININE 2.86*  CALCIUM 8.1*  MG 1.3*   Liver Function Tests: No results for input(s): AST, ALT, ALKPHOS, BILITOT, PROT, ALBUMIN in the last 168 hours. No results for input(s): LIPASE, AMYLASE in the last 168 hours. No results for input(s): AMMONIA in the last 168 hours. CBC: Recent Labs  Lab 01/08/21 1834  WBC 9.0  NEUTROABS 7.0  HGB 10.8*  HCT 32.2*  MCV 92.3  PLT 269   Cardiac Enzymes: No results for input(s): CKTOTAL, CKMB, CKMBINDEX, TROPONINI in the last 168 hours.  BNP (last 3 results) No results for input(s): BNP in the last 8760 hours.  ProBNP (last 3 results) No results for input(s): PROBNP in the last 8760 hours.  CBG: No results for input(s): GLUCAP in the last 168 hours.  Radiological Exams on Admission: DG Chest 2 View  Result Date: 01/08/2021 CLINICAL DATA:  Fall today.  Right elbow fracture. EXAM: CHEST - 2 VIEW COMPARISON:  12/31/2017 FINDINGS: Stable mild cardiomegaly. Aortic atherosclerotic calcification noted. Both lungs are clear. IMPRESSION: Stable mild cardiomegaly. No active lung disease. Electronically  Signed   By: Marlaine Hind M.D.   On: 01/08/2021 17:04   DG Pelvis 1-2 Views  Result Date: 01/08/2021 CLINICAL DATA:  Pain after fall. EXAM: PELVIS - 1-2 VIEW COMPARISON:  None. FINDINGS: The patient is status post left hip replacement. Visualized hardware is in good position. No acute fractures are identified. Vascular calcifications are noted. IMPRESSION: No acute abnormalities. Electronically Signed   By: Dorise Bullion III M.D   On: 01/08/2021 17:01   DG Shoulder Right  Result Date: 01/08/2021 CLINICAL DATA:  Fall outside today.  Right upper extremity pain. EXAM: RIGHT SHOULDER - 2+ VIEW COMPARISON:  None. FINDINGS:  There is no evidence of fracture or dislocation. Mild acromioclavicular and glenohumeral degenerative change. Subcortical cystic change in the lateral humeral head suggests underlying rotator cuff arthropathy. Small subacromial spur. Calcifications projecting over the inferior glenoid. IMPRESSION: 1. No acute fracture or subluxation of the right shoulder. 2. Mild acromioclavicular and glenohumeral degenerative change. Small subacromial spur. 3. Subcortical cystic change in the lateral humeral head suggests underlying rotator cuff arthropathy. Electronically Signed   By: Keith Rake M.D.   On: 01/08/2021 16:55   DG Elbow Complete Right  Result Date: 01/08/2021 CLINICAL DATA:  Pain after fall. EXAM: RIGHT ELBOW - COMPLETE 3+ VIEW COMPARISON:  None. FINDINGS: There is a supracondylar fracture through the distal humerus. A joint effusion is identified. No other abnormalities. IMPRESSION: Supracondylar distal humeral fracture with a joint effusion. Electronically Signed   By: Dorise Bullion III M.D   On: 01/08/2021 16:54   DG Wrist Complete Right  Result Date: 01/08/2021 CLINICAL DATA:  Pain after fall EXAM: RIGHT WRIST - COMPLETE 3+ VIEW COMPARISON:  None. FINDINGS: There is no evidence of fracture or dislocation. There is no evidence of arthropathy or other focal bone abnormality.  Soft tissues are unremarkable. IMPRESSION: Negative. Electronically Signed   By: Dorise Bullion III M.D   On: 01/08/2021 16:55   CT Head Wo Contrast  Result Date: 01/08/2021 CLINICAL DATA:  Fall outside today. EXAM: CT HEAD WITHOUT CONTRAST TECHNIQUE: Contiguous axial images were obtained from the base of the skull through the vertex without intravenous contrast. COMPARISON:  Head CT 08/28/2019 FINDINGS: Brain: No intracranial hemorrhage, mass effect, or midline shift. No hydrocephalus. The basilar cisterns are patent. Stable degree of chronic small vessel ischemia from prior. No evidence of territorial infarct or acute ischemia. No extra-axial or intracranial fluid collection. Vascular: Atherosclerosis of skullbase vasculature without hyperdense vessel or abnormal calcification. Skull: No fracture or focal lesion. Sinuses/Orbits: Chronic opacification of bilateral mastoid air cells. No acute findings. Bilateral cataract resection. Other: None. IMPRESSION: 1. No acute intracranial abnormality. No skull fracture. 2. Stable degree of chronic small vessel ischemia. 3. Chronic opacification of bilateral mastoid air cells. Electronically Signed   By: Keith Rake M.D.   On: 01/08/2021 17:05   CT Cervical Spine Wo Contrast  Result Date: 01/08/2021 CLINICAL DATA:  Fall outside today. EXAM: CT CERVICAL SPINE WITHOUT CONTRAST TECHNIQUE: Multidetector CT imaging of the cervical spine was performed without intravenous contrast. Multiplanar CT image reconstructions were also generated. COMPARISON:  None. FINDINGS: Alignment: Straightening of normal lordosis. Trace degenerative anterolisthesis of C3 on C4 and retrolisthesis of C5 on C6. No traumatic subluxation. Skull base and vertebrae: No acute fracture. Vertebral body heights are maintained. The dens and skull base are intact. Soft tissues and spinal canal: No prevertebral fluid or swelling. No visible canal hematoma. Disc levels: Degenerative disc disease C4-C5,  C5-C6, and C6-C7 with disc space narrowing and endplate spurring. There is multilevel facet hypertrophy. Facet ankylosis at C2-C3 on the left. Upper chest: Mild emphysema.  No acute findings. Other: Carotid calcifications. IMPRESSION: Multilevel degenerative change in the cervical spine without acute fracture or subluxation. Electronically Signed   By: Keith Rake M.D.   On: 01/08/2021 17:08   DG Knee Complete 4 Views Left  Result Date: 01/08/2021 CLINICAL DATA:  Pain after fall EXAM: LEFT KNEE - COMPLETE 4+ VIEW COMPARISON:  None. FINDINGS: The patient is status post left knee replacement. Hardware is intact with no evidence of failure. No fractures are identified. No joint effusion. Vascular calcifications noted.  IMPRESSION: Left knee replacement without evidence of hardware failure. No fractures or effusions. Electronically Signed   By: Dorise Bullion III M.D   On: 01/08/2021 17:00   DG Knee Complete 4 Views Right  Result Date: 01/08/2021 CLINICAL DATA:  Pain after fall EXAM: RIGHT KNEE - COMPLETE 4+ VIEW COMPARISON:  None FINDINGS: The patient is status post right knee replacement. There is a comminuted fracture involving the lateral and medial aspects of the distal femur. A fat fluid level seen on the lateral view consistent with a hemarthrosis. The proximal fibula and proximal tibia are intact. IMPRESSION: Comminuted fracture of the distal lateral and medial femur. Fracture lines extend to the femoral component of the knee replacement. Hemarthrosis consistent with the distal femoral fracture. Electronically Signed   By: Dorise Bullion III M.D   On: 01/08/2021 16:59    EKG: I independently viewed the EKG done and my findings are as followed: No EKG was done in the ED   Assessment/Plan Present on Admission: . Right femoral fracture ([place]) . Essential hypertension  Active Problems:   Tobacco abuse   Essential hypertension   Right femoral fracture ([facility])   Right humeral fracture   AKI  (acute kidney injury) ([place])   Hypokalemia   Fall at home, initial encounter   Hyperlipidemia   Type 2 diabetes mellitus without complication ([place])   Obesity (BMI 30.0-34.9)   Dementia without behavioral disturbance ([place])   Hypomagnesemia    Fall at home comminuted fracture of the distal lateral and medial femur Supracondylar distal humeral fracture with a joint effusion Continue fall precaution and neurochecks Continue Tylenol 650 mg every 6 hours as needed for mild pain Continue IV morphine 2 mg every 4 hours as needed for moderate/severe pain Continue PT/OT eval and treat Dr. Aline Brochure was already consulted by ED physician and wants patient admitted here at [facility] while working on finding an accepting orthopedist in [place].  We shall await further recommendation  Hypokalemia K+ 2.3, this will be replenished  Hypomagnesemia Mg 1.3, this will be replenished  Acute kidney injury BUN/creatinine 34/2.86 (creatinine for comparison was in October 2019 at 1.44) Continue gentle hydration Renally adjust medications, avoid nephrotoxic agents/dehydration/hypotension  Hypertensive urgency Essential hypertension (uncontrolled) Continue home hydralazine and verapamil Continue IV hydralazine 10 mg every 6 hours as needed for SBP > 170  Hyperlipidemia Continue Crestor  T2DM Continue ISS and hypoglycemic protocol  Dementia Continue Aricept  Depression Continue sertraline  Obesity (BMI 31.28) Patient was counseled on diet and lifestyle modification  Tobacco abuse Patient was counseled on tobacco abuse cessation Continue nicotine patch  DVT prophylaxis: SCDs  Code Status: Full code  Family Communication: None at bedside  Disposition Plan:  Patient is from:                        home Anticipated DC to:                   SNF or family members home Anticipated DC date:               2-3 days Anticipated DC barriers:          Patient requires inpatient management of  humeral and femoral fractures requiring surgical repair  Consults called: Orthopedic surgery (by [facility] ED physician)  Admission status: Inpatient    Bernadette Hoit MD [facility]  01/08/2021, 8:48 PM

## 2021-01-08 NOTE — ED Provider Notes (Signed)
[facility] EMERGENCY DEPARTMENT Provider Note   CSN: [id number] Arrival date & time: 01/08/21  1538     History Chief Complaint  Patient presents with  . Fall    [name] is a 70 y.o. female.  Pt presents to the ED today with a fall.  The pt has recently diagnosed dementia and her son took away her keys to the car.  She lives alone and a neighbor saw her fall trying to get into her locked car.  The pt c/o right arm, right knee, and right hip pain.  Pt was unable to walk after the fall.  She was brought here by EMS.  She is right handed.  It is unclear if she hit her head.  She is not on blood thinners.        Past Medical History:  Diagnosis Date  . Cancer ([place])    ovarian  . Carpal tunnel syndrome    Nocturnal and positional, bilateral  . COPD (chronic obstructive pulmonary disease) ([place])   . Degenerative joint disease   . Diabetes mellitus    Insulin-dependent diabetes  . Hypercholesteremia   . Hypertension     Patient Active Problem List   Diagnosis Date Noted  . Right femoral fracture ([place]) 01/08/2021  . Posterior vitreous detachment, both eyes 07/19/2020  . Moderate nonproliferative diabetic retinopathy of both eyes without macular edema associated with type 2 diabetes mellitus ([place]) 07/19/2020  . 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 Essential hypertension 02/10/2016  . Former smoker 02/01/2016  . Anemia, unspecified 02/01/2016  . Osteoarthritis of left hip   . GERD (gastroesophageal reflux disease) 04/02/2013  . Chronic bronchitis with acute exacerbation ([place]) 04/02/2013  . History of ovarian cancer 04/02/2013  . Spinal stenosis, lumbar region, with neurogenic claudication 02/27/2013  . Iliotibial band tendonitis 02/27/2013  . DEGENERATIVE DISC DISEASE, LUMBOSACRAL SPINE 12/09/2009  . H N P-LUMBAR 09/29/2009  . KNEE, ARTHRITIS, DEGEN./OSTEO 09/23/2008  . SCIATICA 05/14/2008  . Insulin dependent diabetes mellitus ([place])  05/12/2008    Past Surgical History:  Procedure Laterality Date  . ABDOMINAL HYSTERECTOMY     BSO  . ABDOMINAL SURGERY     for bleeding after hysterectomy  . CATARACT EXTRACTION W/PHACO Left 07/19/2018   Procedure: CATARACT EXTRACTION PHACO AND INTRAOCULAR LENS PLACEMENT (IOC) LEFT CDE:3.26;  Surgeon: Baruch Goldmann, MD;  Location: [facility] ORS;  Service: Ophthalmology;  Laterality: Left;  . CATARACT EXTRACTION W/PHACO Right 08/23/2018   Procedure: CATARACT EXTRACTION PHACO AND INTRAOCULAR LENS PLACEMENT RIGHT EYE;  Surgeon: Baruch Goldmann, MD;  Location: [facility] ORS;  Service: Ophthalmology;  Laterality: Right;  right  . CHOLECYSTECTOMY    . COLONOSCOPY  2007   Dr. Gala Romney: internal hemorrhoids, few scattered sigmoid diverticula  . COLONOSCOPY N/A 04/09/2014   SAY:TKZSWFU diverticulosis. Redundant colon. Single colonic polyp-removed as described above.  . ESOPHAGOGASTRODUODENOSCOPY  2008   Dr. Gala Romney: erosive esophagitis, patulous EG junction  . ESOPHAGOGASTRODUODENOSCOPY N/A 04/09/2014   XNA:TFTDDUK reflux esophagitis. Schatzki's ring-status post dilation as described above. Hiatal hernia. Gastricerosions-status post gastric biopsy  . HIP CLOSED REDUCTION Left 01/15/2016   Procedure: CLOSED REDUCTION LEFT HIP PROSTHESIS;  Surgeon: Carole Civil, MD;  Location: [facility];  Service: Orthopedics;  Laterality: Left;  . left hip replacement  12/08/2015  . MALONEY DILATION N/A 04/09/2014   Procedure: Venia Minks DILATION;  Surgeon: Daneil Dolin, MD;  Location: [facility] ENDO SUITE;  Service: Endoscopy;  Laterality: N/A;  . REPLACEMENT TOTAL KNEE  bilateral  . SAVORY DILATION N/A 04/09/2014   Procedure: SAVORY DILATION;  Surgeon: Daneil Dolin, MD;  Location: [facility] ENDO SUITE;  Service: Endoscopy;  Laterality: N/A;  . TOENAIL EXCISION     R great toenail; artificial nail  . TOTAL HIP ARTHROPLASTY Left 12/08/2015   Procedure: TOTAL HIP ARTHROPLASTY;  Surgeon: Carole Civil, MD;  Location: [facility] OR[facility];  Service:  Orthopedics;  Laterality: Left;  . TOTAL HIP REVISION Left 01/26/2016   Procedure: LEFT TOTAL HIP REVISION;  Surgeon: Carole Civil, MD;  Location: [facility];  Service: Orthopedics;  Laterality: Left;     OB History   No obstetric history on file.     Family History  Problem Relation Age of Onset  . Stroke Mother   . Diabetes Mother   . Diabetes Father   . Cancer Brother        Leukemia and bone cancer  . Colon cancer Neg Hx   . Heart disease Neg Hx     Social History   Tobacco Use  . Smoking status: Former Smoker    Packs/day: 1.00    Years: 54.00    Pack years: 54.00    Types: Cigarettes    Quit date: 01/17/2016    Years since quitting: 4.9  . Smokeless tobacco: Never Used  . Tobacco comment: Smoked [phone] up to 1 pack per day; she did stop  9 months during pregnancy  Vaping Use  . Vaping Use: Never used  Substance Use Topics  . Alcohol use: No    Alcohol/week: 0.0 standard drinks  . Drug use: No    Home Medications Prior to Admission medications   Medication Sig Start Date End Date Taking? Authorizing Provider  aspirin EC 81 MG tablet Take 81 mg by mouth daily.    [provider]  atenolol (TENORMIN) 25 MG tablet Take 1 tablet (25 mg total) by mouth daily. 06/17/18 04/07/19  Arnoldo Lenis, MD  chlorthalidone (HYGROTON) 25 MG tablet TAKE 1 TABLET BY MOUTH  DAILY 11/04/20   Arnoldo Lenis, MD  gabapentin (NEURONTIN) 100 MG capsule TAKE 1 CAPSULE BY MOUTH THREE TIMES DAILY 01/12/20   Carole Civil, MD  hydrALAZINE (APRESOLINE) 25 MG tablet Take 25 mg by mouth 3 (three) times daily.    [provider]  ibuprofen (ADVIL) 800 MG tablet Take 1 tablet (800 mg total) by mouth every 8 (eight) hours as needed. 07/21/20   Carole Civil, MD  insulin degludec (TRESIBA) 100 UNIT/ML FlexTouch Pen Inject 18 Units into the skin at bedtime.    [provider]  lisinopril (PRINIVIL,ZESTRIL) 10 MG tablet Take 10 mg by mouth every evening.      [provider]  meloxicam (MOBIC) 15 MG tablet Take 15 mg by mouth daily. 05/12/19   [provider]  rosuvastatin (CRESTOR) 5 MG tablet  10/10/18   [provider]  traMADol (ULTRAM) 50 MG tablet Take 1 tablet (50 mg total) by mouth every 6 (six) hours as needed. 04/07/19   Carole Civil, MD  triamterene-hydrochlorothiazide (DYAZIDE) 37.5-25 MG capsule Take 1 capsule by mouth daily. 01/03/16   [provider]  verapamil (CALAN-SR) 240 MG CR tablet Take 240 mg by mouth daily.  01/03/16   [provider]    Allergies    Gabapentin  Review of Systems   Review of Systems  Musculoskeletal:       Right shoulder, right elbow, right wrist, right hip, right knee, and  left knee pain  All other systems reviewed and are negative.   Physical Exam Updated Vital Signs BP (!) 198/84   Pulse (!) 49   Temp 98.7 F (37.1 C) (Oral)   Resp 20   Ht 5\' 5"  (1.651 m)   Wt 85.3 kg   SpO2 100%   BMI 31.28 kg/m   Physical Exam Vitals and nursing note reviewed.  Constitutional:      Appearance: Normal appearance.  HENT:     Head: Normocephalic and atraumatic.     Right Ear: External ear normal.     Left Ear: External ear normal.     Nose: Nose normal.     Mouth/Throat:     Mouth: Mucous membranes are moist.     Pharynx: Oropharynx is clear.  Eyes:     Extraocular Movements: Extraocular movements intact.     Conjunctiva/sclera: Conjunctivae normal.     Pupils: Pupils are equal, round, and reactive to light.  Cardiovascular:     Rate and Rhythm: Normal rate and regular rhythm.     Pulses: Normal pulses.     Heart sounds: Normal heart sounds.  Pulmonary:     Effort: Pulmonary effort is normal.     Breath sounds: Normal breath sounds.  Abdominal:     General: Abdomen is flat. Bowel sounds are normal.     Palpations: Abdomen is soft.  Musculoskeletal:       Arms:     Cervical back: Normal range of motion and neck supple.        Legs:  Skin:    General: Skin is warm.     Capillary Refill: Capillary refill takes less than 2 seconds.  Neurological:     General: No focal deficit present.     Mental Status: She is alert and oriented to person, place, and time.  Psychiatric:        Mood and Affect: Mood normal.        Behavior: Behavior normal.        Thought Content: Thought content normal.        Judgment: Judgment normal.     ED Results / Procedures / Treatments   Labs (all labs ordered are listed, but only abnormal results are displayed) Labs Reviewed  BASIC METABOLIC PANEL - Abnormal; Notable for the following components:      Result Value   Sodium 134 (*)    Potassium 2.3 (*)    Glucose, Bld 121 (*)    BUN 34 (*)    Creatinine, Ser 2.86 (*)    Calcium 8.1 (*)    GFR, Estimated 17 (*)    All other components within normal limits  CBC WITH DIFFERENTIAL/PLATELET - Abnormal; Notable for the following components:   RBC 3.49 (*)    Hemoglobin 10.8 (*)    HCT 32.2 (*)    All other components within normal limits  RESP PANEL BY RT-PCR (FLU A&B, COVID) ARPGX2  URINALYSIS, ROUTINE W REFLEX MICROSCOPIC  MAGNESIUM    EKG None  Radiology DG Chest 2 View  Result Date: 01/08/2021 CLINICAL DATA:  Fall today.  Right elbow fracture. EXAM: CHEST - 2 VIEW COMPARISON:  12/31/2017 FINDINGS: Stable mild cardiomegaly. Aortic atherosclerotic calcification noted. Both lungs are clear. IMPRESSION: Stable mild cardiomegaly. No active lung disease. Electronically Signed   By: Marlaine Hind M.D.   On: 01/08/2021 17:04   DG Pelvis 1-2 Views  Result Date: 01/08/2021 CLINICAL DATA:  Pain after fall. EXAM: PELVIS - 1-2  VIEW COMPARISON:  None. FINDINGS: The patient is status post left hip replacement. Visualized hardware is in good position. No acute fractures are identified. Vascular calcifications are noted. IMPRESSION: No acute abnormalities. Electronically Signed   By: Dorise Bullion III M.D   On: 01/08/2021 17:01   DG  Shoulder Right  Result Date: 01/08/2021 CLINICAL DATA:  Fall outside today.  Right upper extremity pain. EXAM: RIGHT SHOULDER - 2+ VIEW COMPARISON:  None. FINDINGS: There is no evidence of fracture or dislocation. Mild acromioclavicular and glenohumeral degenerative change. Subcortical cystic change in the lateral humeral head suggests underlying rotator cuff arthropathy. Small subacromial spur. Calcifications projecting over the inferior glenoid. IMPRESSION: 1. No acute fracture or subluxation of the right shoulder. 2. Mild acromioclavicular and glenohumeral degenerative change. Small subacromial spur. 3. Subcortical cystic change in the lateral humeral head suggests underlying rotator cuff arthropathy. Electronically Signed   By: Keith Rake M.D.   On: 01/08/2021 16:55   DG Elbow Complete Right  Result Date: 01/08/2021 CLINICAL DATA:  Pain after fall. EXAM: RIGHT ELBOW - COMPLETE 3+ VIEW COMPARISON:  None. FINDINGS: There is a supracondylar fracture through the distal humerus. A joint effusion is identified. No other abnormalities. IMPRESSION: Supracondylar distal humeral fracture with a joint effusion. Electronically Signed   By: Dorise Bullion III M.D   On: 01/08/2021 16:54   DG Wrist Complete Right  Result Date: 01/08/2021 CLINICAL DATA:  Pain after fall EXAM: RIGHT WRIST - COMPLETE 3+ VIEW COMPARISON:  None. FINDINGS: There is no evidence of fracture or dislocation. There is no evidence of arthropathy or other focal bone abnormality. Soft tissues are unremarkable. IMPRESSION: Negative. Electronically Signed   By: Dorise Bullion III M.D   On: 01/08/2021 16:55   CT Head Wo Contrast  Result Date: 01/08/2021 CLINICAL DATA:  Fall outside today. EXAM: CT HEAD WITHOUT CONTRAST TECHNIQUE: Contiguous axial images were obtained from the base of the skull through the vertex without intravenous contrast. COMPARISON:  Head CT 08/28/2019 FINDINGS: Brain: No intracranial hemorrhage, mass effect, or  midline shift. No hydrocephalus. The basilar cisterns are patent. Stable degree of chronic small vessel ischemia from prior. No evidence of territorial infarct or acute ischemia. No extra-axial or intracranial fluid collection. Vascular: Atherosclerosis of skullbase vasculature without hyperdense vessel or abnormal calcification. Skull: No fracture or focal lesion. Sinuses/Orbits: Chronic opacification of bilateral mastoid air cells. No acute findings. Bilateral cataract resection. Other: None. IMPRESSION: 1. No acute intracranial abnormality. No skull fracture. 2. Stable degree of chronic small vessel ischemia. 3. Chronic opacification of bilateral mastoid air cells. Electronically Signed   By: Keith Rake M.D.   On: 01/08/2021 17:05   CT Cervical Spine Wo Contrast  Result Date: 01/08/2021 CLINICAL DATA:  Fall outside today. EXAM: CT CERVICAL SPINE WITHOUT CONTRAST TECHNIQUE: Multidetector CT imaging of the cervical spine was performed without intravenous contrast. Multiplanar CT image reconstructions were also generated. COMPARISON:  None. FINDINGS: Alignment: Straightening of normal lordosis. Trace degenerative anterolisthesis of C3 on C4 and retrolisthesis of C5 on C6. No traumatic subluxation. Skull base and vertebrae: No acute fracture. Vertebral body heights are maintained. The dens and skull base are intact. Soft tissues and spinal canal: No prevertebral fluid or swelling. No visible canal hematoma. Disc levels: Degenerative disc disease C4-C5, C5-C6, and C6-C7 with disc space narrowing and endplate spurring. There is multilevel facet hypertrophy. Facet ankylosis at C2-C3 on the left. Upper chest: Mild emphysema.  No acute findings. Other: Carotid calcifications. IMPRESSION: Multilevel degenerative change in  the cervical spine without acute fracture or subluxation. Electronically Signed   By: Keith Rake M.D.   On: 01/08/2021 17:08   DG Knee Complete 4 Views Left  Result Date:  01/08/2021 CLINICAL DATA:  Pain after fall EXAM: LEFT KNEE - COMPLETE 4+ VIEW COMPARISON:  None. FINDINGS: The patient is status post left knee replacement. Hardware is intact with no evidence of failure. No fractures are identified. No joint effusion. Vascular calcifications noted. IMPRESSION: Left knee replacement without evidence of hardware failure. No fractures or effusions. Electronically Signed   By: Dorise Bullion III M.D   On: 01/08/2021 17:00   DG Knee Complete 4 Views Right  Result Date: 01/08/2021 CLINICAL DATA:  Pain after fall EXAM: RIGHT KNEE - COMPLETE 4+ VIEW COMPARISON:  None FINDINGS: The patient is status post right knee replacement. There is a comminuted fracture involving the lateral and medial aspects of the distal femur. A fat fluid level seen on the lateral view consistent with a hemarthrosis. The proximal fibula and proximal tibia are intact. IMPRESSION: Comminuted fracture of the distal lateral and medial femur. Fracture lines extend to the femoral component of the knee replacement. Hemarthrosis consistent with the distal femoral fracture. Electronically Signed   By: Dorise Bullion III M.D   On: 01/08/2021 16:59    Procedures Procedures   Medications Ordered in ED Medications  potassium chloride SA (KLOR-CON) CR tablet 40 mEq (has no administration in time range)  sodium chloride 0.9 % bolus 1,000 mL (has no administration in time range)  HYDROcodone-acetaminophen (NORCO/VICODIN) 5-325 MG per tablet 1 tablet (1 tablet Oral Given 01/08/21 1655)    ED Course  I have reviewed the triage vital signs and the nursing notes.  Pertinent labs & imaging results that were available during my care of the patient were reviewed by me and considered in my medical decision making (see chart for details).    MDM Rules/Calculators/A&P                          Pt d/w Dr. Aline Brochure.  He is unable to fix the fractures that patient has sustained.  He is working on finding an accepting  orthopedist in [place].  He requests that we admit here until he can arrange this.  Pt's right arm placed in a posterior splint and her right leg is placed in a knee immobilizer.  K is low.  Kdur given.  Mg level ordered.  Kidney function is worse than in 2019.  Pt's son said she has been told that she has kidney problems.   Pt d/w Dr. Josephine Cables ([facility]ad) for admission.  Final Clinical Impression(s) / ED Diagnoses Final diagnoses:  Hypokalemia  Fall, initial encounter  AKI (acute kidney injury) ([place])  Closed nondisplaced supracondylar fracture of distal end of right femur with intracondylar extension, initial encounter ([place])  Closed fracture of distal end of right femur, unspecified fracture morphology, initial encounter [facility])    Rx / [place] Orders ED Discharge Orders    None       Isla Pence, MD 01/08/21 [phone]

## 2021-01-08 NOTE — ED Notes (Signed)
PT was at home attempting to get into her locked car. Lost her balance and fell on the ground. Pt denies LOC, cannot remember if she hit her head. Son is on his way here, was at the house. EMS states they did not not any specific extremity deformity when they arrived. Pt has recent dx of dementia, son reports he has kept the car locked due to concern for her driving. Pt awake, able to follow directions appropriately. Alert to location, events. C/O pain to right arm and shoulder, bilateral knees. Pmh of bilateral knee and hip replacements. Able to bend both knees slightly. No deformities or wounds noted to head. PERRL. Speech clear. MD has been to bedside for assessment. Plan of care explained to pt.

## 2021-01-08 NOTE — ED Notes (Signed)
Date and time results received: 01/08/21 1919  Test: potassium Critical Value: 2.3  Name of Provider Notified: Gilford Raid, MD  Orders Received? Or Actions Taken?: acknowledged

## 2021-01-08 NOTE — ED Notes (Signed)
Pt unable to sign MSE signature pad due to injury to right arm, pt is right-handed. Pt gave verbal MSE consent.

## 2021-01-09 DIAGNOSIS — N179 Acute kidney failure, unspecified: Secondary | ICD-10-CM | POA: Diagnosis not present

## 2021-01-09 DIAGNOSIS — W19XXXA Unspecified fall, initial encounter: Secondary | ICD-10-CM

## 2021-01-09 DIAGNOSIS — S42474A Nondisplaced transcondylar fracture of right humerus, initial encounter for closed fracture: Secondary | ICD-10-CM | POA: Diagnosis not present

## 2021-01-09 DIAGNOSIS — Z794 Long term (current) use of insulin: Secondary | ICD-10-CM

## 2021-01-09 DIAGNOSIS — Z96642 Presence of left artificial hip joint: Secondary | ICD-10-CM

## 2021-01-09 DIAGNOSIS — E876 Hypokalemia: Secondary | ICD-10-CM | POA: Diagnosis not present

## 2021-01-09 DIAGNOSIS — M978XXA Periprosthetic fracture around other internal prosthetic joint, initial encounter: Secondary | ICD-10-CM | POA: Diagnosis not present

## 2021-01-09 DIAGNOSIS — I10 Essential (primary) hypertension: Secondary | ICD-10-CM | POA: Diagnosis not present

## 2021-01-09 DIAGNOSIS — S7291XA Unspecified fracture of right femur, initial encounter for closed fracture: Secondary | ICD-10-CM

## 2021-01-09 DIAGNOSIS — Z96651 Presence of right artificial knee joint: Secondary | ICD-10-CM

## 2021-01-09 DIAGNOSIS — Z720 Tobacco use: Secondary | ICD-10-CM

## 2021-01-09 DIAGNOSIS — E119 Type 2 diabetes mellitus without complications: Secondary | ICD-10-CM

## 2021-01-09 DIAGNOSIS — J449 Chronic obstructive pulmonary disease, unspecified: Secondary | ICD-10-CM | POA: Diagnosis present

## 2021-01-09 LAB — HEMOGLOBIN A1C
Hgb A1c MFr Bld: 6.3 % — ABNORMAL HIGH (ref 4.8–5.6)
Mean Plasma Glucose: 134.11 mg/dL

## 2021-01-09 LAB — MAGNESIUM: Magnesium: 1.5 mg/dL — ABNORMAL LOW (ref 1.7–2.4)

## 2021-01-09 LAB — COMPREHENSIVE METABOLIC PANEL
ALT: 12 U/L (ref 0–44)
AST: 13 U/L — ABNORMAL LOW (ref 15–41)
Albumin: 3 g/dL — ABNORMAL LOW (ref 3.5–5.0)
Alkaline Phosphatase: 69 U/L (ref 38–126)
Anion gap: 12 (ref 5–15)
BUN: 32 mg/dL — ABNORMAL HIGH (ref 8–23)
CO2: 21 mmol/L — ABNORMAL LOW (ref 22–32)
Calcium: 8.2 mg/dL — ABNORMAL LOW (ref 8.9–10.3)
Chloride: 103 mmol/L (ref 98–111)
Creatinine, Ser: 2.42 mg/dL — ABNORMAL HIGH (ref 0.44–1.00)
GFR, Estimated: 21 mL/min — ABNORMAL LOW (ref >60)
Glucose, Bld: 136 mg/dL — ABNORMAL HIGH (ref 70–99)
Potassium: 2.6 mmol/L — CL (ref 3.5–5.1)
Sodium: 136 mmol/L (ref 135–145)
Total Bilirubin: 1.7 mg/dL — ABNORMAL HIGH (ref 0.3–1.2)
Total Protein: 5.4 g/dL — ABNORMAL LOW (ref 6.5–8.1)

## 2021-01-09 LAB — GLUCOSE, CAPILLARY
Glucose-Capillary: 107 mg/dL — ABNORMAL HIGH (ref 70–99)
Glucose-Capillary: 111 mg/dL — ABNORMAL HIGH (ref 70–99)
Glucose-Capillary: 116 mg/dL — ABNORMAL HIGH (ref 70–99)
Glucose-Capillary: 125 mg/dL — ABNORMAL HIGH (ref 70–99)
Glucose-Capillary: 130 mg/dL — ABNORMAL HIGH (ref 70–99)
Glucose-Capillary: 92 mg/dL (ref 70–99)

## 2021-01-09 LAB — CBC
HCT: 32 % — ABNORMAL LOW (ref 36.0–46.0)
Hemoglobin: 10.3 g/dL — ABNORMAL LOW (ref 12.0–15.0)
MCH: 30.1 pg (ref 26.0–34.0)
MCHC: 32.2 g/dL (ref 30.0–36.0)
MCV: 93.6 fL (ref 80.0–100.0)
Platelets: 275 10*3/uL (ref 150–400)
RBC: 3.42 MIL/uL — ABNORMAL LOW (ref 3.87–5.11)
RDW: 13.2 % (ref 11.5–15.5)
WBC: 10.7 10*3/uL — ABNORMAL HIGH (ref 4.0–10.5)
nRBC: 0 % (ref 0.0–0.2)

## 2021-01-09 LAB — PHOSPHORUS: Phosphorus: 3.8 mg/dL (ref 2.5–4.6)

## 2021-01-09 LAB — APTT: aPTT: 24 seconds (ref 24–36)

## 2021-01-09 LAB — PROTIME-INR
INR: 1 (ref 0.8–1.2)
Prothrombin Time: 13.1 seconds (ref 11.4–15.2)

## 2021-01-09 MED ORDER — HYDRALAZINE HCL 25 MG PO TABS: 50.0000 mg | ORAL_TABLET | Freq: Three times a day (TID) | ORAL | Status: DC

## 2021-01-09 MED ORDER — POTASSIUM CHLORIDE CRYS ER 20 MEQ PO TBCR
40.0000 meq | EXTENDED_RELEASE_TABLET | ORAL | Status: AC
  Administered 2021-01-09 (×2): 40 meq via ORAL
  Filled 2021-01-09 (×2): qty 2

## 2021-01-09 MED ORDER — POTASSIUM CHLORIDE IN NACL 20-0.9 MEQ/L-% IV SOLN: INTRAVENOUS | Status: DC

## 2021-01-09 MED ORDER — CHLORHEXIDINE GLUCONATE CLOTH 2 % EX PADS
6.0000 | MEDICATED_PAD | Freq: Every day | CUTANEOUS | Status: DC
  Administered 2021-01-09 – 2021-01-21 (×11): 6 via TOPICAL

## 2021-01-09 MED ORDER — METHOCARBAMOL 500 MG PO TABS
750.0000 mg | ORAL_TABLET | Freq: Three times a day (TID) | ORAL | Status: DC
  Administered 2021-01-09 – 2021-01-15 (×20): 750 mg via ORAL
  Filled 2021-01-09 (×22): qty 2

## 2021-01-09 MED ORDER — AMLODIPINE BESYLATE 5 MG PO TABS
10.0000 mg | ORAL_TABLET | Freq: Every day | ORAL | Status: DC
  Administered 2021-01-09 – 2021-01-15 (×7): 10 mg via ORAL
  Filled 2021-01-09 (×7): qty 2

## 2021-01-09 MED ORDER — HYDRALAZINE HCL 20 MG/ML IJ SOLN
10.0000 mg | Freq: Four times a day (QID) | INTRAMUSCULAR | Status: DC | PRN
  Administered 2021-01-09: 10 mg via INTRAVENOUS
  Filled 2021-01-09: qty 1

## 2021-01-09 MED ORDER — MAGNESIUM SULFATE 4 GM/100ML IV SOLN
4.0000 g | Freq: Once | INTRAVENOUS | Status: AC
  Administered 2021-01-09: 4 g via INTRAVENOUS
  Filled 2021-01-09: qty 100

## 2021-01-09 MED ORDER — LORAZEPAM 2 MG/ML IJ SOLN
0.5000 mg | Freq: Four times a day (QID) | INTRAMUSCULAR | Status: DC | PRN
  Administered 2021-01-19: 0.5 mg via INTRAVENOUS
  Filled 2021-01-09: qty 1

## 2021-01-09 MED ORDER — OXYCODONE HCL 5 MG PO TABS
5.0000 mg | ORAL_TABLET | ORAL | Status: DC | PRN
  Administered 2021-01-09 – 2021-01-15 (×14): 5 mg via ORAL
  Filled 2021-01-09 (×14): qty 1

## 2021-01-09 MED ORDER — ATENOLOL 25 MG PO TABS
50.0000 mg | ORAL_TABLET | Freq: Every day | ORAL | Status: DC
  Administered 2021-01-09 – 2021-01-13 (×5): 50 mg via ORAL
  Filled 2021-01-09 (×5): qty 2

## 2021-01-09 MED ORDER — SENNOSIDES-DOCUSATE SODIUM 8.6-50 MG PO TABS
2.0000 | ORAL_TABLET | Freq: Every day | ORAL | Status: DC
  Administered 2021-01-09 – 2021-01-20 (×11): 2 via ORAL
  Filled 2021-01-09 (×11): qty 2

## 2021-01-09 MED ORDER — POLYETHYLENE GLYCOL 3350 17 G PO PACK
17.0000 g | PACK | Freq: Every day | ORAL | Status: DC
  Administered 2021-01-09 – 2021-01-21 (×13): 17 g via ORAL
  Filled 2021-01-09 (×13): qty 1

## 2021-01-09 MED ORDER — HYDRALAZINE HCL 20 MG/ML IJ SOLN
10.0000 mg | Freq: Four times a day (QID) | INTRAMUSCULAR | Status: DC | PRN
  Administered 2021-01-12: 10 mg via INTRAVENOUS
  Filled 2021-01-09 (×2): qty 1

## 2021-01-09 MED ORDER — LABETALOL HCL 5 MG/ML IV SOLN
10.0000 mg | INTRAVENOUS | Status: DC | PRN
  Administered 2021-01-09: 10 mg via INTRAVENOUS
  Filled 2021-01-09: qty 4

## 2021-01-09 MED ORDER — POTASSIUM CHLORIDE 10 MEQ/100ML IV SOLN
10.0000 meq | INTRAVENOUS | Status: AC
  Administered 2021-01-09 (×4): 10 meq via INTRAVENOUS
  Filled 2021-01-09 (×4): qty 100

## 2021-01-09 NOTE — Progress Notes (Signed)
Patient Demographics:    [name], is a 70 y.o. female, DOB - October 05, 1950, GEZ:[id number]  Admit date - 01/08/2021   Admitting Physician Bernadette Hoit, DO  Outpatient Primary MD for the patient is [name], MD  LOS - 1   Chief Complaint  Patient presents with  . Fall        Subjective:    [name] today has no fevers, no emesis,  No chest pain,  -Patient complains of right elbow and right knee pain -Son at bedside, questions answered  Assessment  & Plan :    Principal Problem:   Right Distal femoral fracture/Rt Knee Fx/Periprosthetic Not Amenable to Operative Fixation Active Problems:   Right Distal Humeral Fracture/Rt Elbow Fracture-Not Amenable to Operative Fixation   Dementia without behavioral disturbance (HCC)   Essential hypertension   Hypokalemia   Hypomagnesemia   COPD (chronic obstructive pulmonary disease) (HCC)   Tobacco abuse   Anemia, unspecified   S/P hip replacement, 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 female with who is a reformed smoker with medical history significant for HTN, HLD, T2DM, dementia and obesity, COPD admitted on 01/08/2021 after falling at home with right distal humerus/elbow and right distal femur/knee fracture, not amenable to surgical fixation, requiring prolonged rehab  A/p 1)Rt distal femur/knee fracture----discussed with orthopedic surgeon Dr. Aline Brochure, who also discussed with orthopedic surgeon on-call at [facility]--- after reviewing imaging studies and further orthopedic consultation- -They advised rehab on conservative medical management -Apparently fracture location is Not amenable to operative fixation -Patient will have to be nonweightbearing  for up to 90 days -Will need rehab and pain control  2)Right distal humerus/elbow fracture--- as per orthopedic surgeon not amenable to operative fixation -Casting and conservative treatment with rehab advised -Not weightbearing on right upper extremity for 90 days  3)HTN--BP remains elevated most likely due to noncompliance with medications at home and also pain -Continue atenolol - Stop chlorthalidone due to persistent electrolyte abnormalities -Stop oral hydralazine -Stop verapamil due to concerns for bradycardia with concomitant use of atenolol -Add amlodipine 10 mg daily avoid ACEI/ARB/ARNI due to renal concerns -Okay to use IV hydralazine as needed elevated BP  4)DM2-recent A1c prior A1c was 8.7 reflecting uncontrolled diabetes with hyperglycemia PTA -At home patient was supposed to be taking [place] but had not been compliant lately Use Novolog/Humalog Sliding scale insulin with Accu-Cheks/Fingersticks as ordered  5)AKI----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   -Creatinine is down to 2.4 from 2.86 on admission -Prior baseline was around 1.4 renally adjust medications, avoid nephrotoxic agents / dehydration  / hypotension -Stop chlorthalidone due to persistent electrolyte derangement  6)Hypokalemia/hyponatremia/hypomagnesemia--stop chlorthalidone,  -Replace and recheck electrolytes   7)Dementia with some cognitive and memory deficits--currently on Aricept -May use lorazepam as needed agitation  8)Recurrent falls---PTA pt lived alone and did very poorly, patient has significant limitations with mobility related ADLs- this patient needs to continue to be monitored in the hospital until a SNF bed is obtained as she is Not safe to go home with her current physcical  limitations - right knee and right elbow fractures, patient is nonweightbearing at this time due to acute fractures - status post fall at home, No safe discharge plan at this time*  -9) tobacco abuse----smoking  cessation advised, may use nicotine patch  Disposition/Need for in-Hospital Stay- patient unable to be discharged at this time due to --- dehydration/electrolyte abnormalities with AKI requiring IV fluids, right knee and right elbow fractures, patient is nonweightbearing, status post fall at home, No safe discharge plan at this time*  Status is: Inpatient  Remains inpatient appropriate because:Please see disposition above   Disposition: The patient is from: Home              Anticipated d/c is to: SNF              Anticipated d/c date is: 2 days              Patient currently is not medically stable to d/c. Barriers: Not Clinically Stable-   Code Status : -  Code Status: Full Code   Family Communication:    (patient is alert, awake and coherent)  Discussed with son at bedside  Consults  :  ortho  DVT Prophylaxis  :   - SCDs SCDs Start: 01/08/21 2051    Lab Results  Component Value Date   PLT 275 01/09/2021    Inpatient Medications  Scheduled Meds: . amLODipine  10 mg Oral Daily  . atenolol  50 mg Oral QHS  . donepezil  5 mg Oral Daily  . insulin aspart  0-9 Units Subcutaneous Q4H  . methocarbamol  750 mg Oral TID  . nicotine  21 mg Transdermal Daily  . polyethylene glycol  17 g Oral Daily  . potassium chloride  40 mEq Oral Q3H  . rosuvastatin  5 mg Oral Daily  . senna-docusate  2 tablet Oral QHS  . sertraline  50 mg Oral Daily   Continuous Infusions: . 0.9 % NaCl with KCl 20 mEq / L    . potassium chloride 10 mEq (01/09/21 1115)   PRN Meds:.acetaminophen, hydrALAZINE, LORazepam, morphine injection, oxyCODONE    Anti-infectives (From admission, onward)   None        Objective:   Vitals:   01/09/21 0515 01/09/21 0546 01/09/21 0940 01/09/21 1127  BP: (!) 160/56 (!) 156/70 (!) 202/79 (!) 184/79  Pulse: (!) 55 (!) 59 (!) 58 (!) 55  Resp:  18    Temp:  98.8 F (37.1 C)    TempSrc:      SpO2:  97% 100% 100%  Weight:      Height:        Wt Readings  from Last 3 Encounters:  01/08/21 85.3 kg  12/13/20 85.3 kg  12/15/19 95.7 kg     Intake/Output Summary (Last 24 hours) at 01/09/2021 1205 Last data filed at 01/09/2021 0900 Gross per 24 hour  Intake 0 ml  Output --  Net 0 ml     Physical Exam  Gen:- Awake Alert,  In no apparent distress  HEENT:- [place].AT, No sclera icterus Neck-Supple Neck,No JVD,.  Lungs-  CTAB , fair symmetrical air movement] CV- S1, S2 normal, regular  Abd-  +ve B.Sounds, Abd Soft, No tenderness,    Extremity/Skin:- No  edema, pedal pulses present  Psych-affect is flat, some baseline cognitive and memory deficits,   Neuro-generalized weakness, no new focal deficits, no tremors MSK-right elbow and right knee fractures as above with braces/sling   Data Review:  Micro Results Recent Results (from the past 240 hour(s))  Resp Panel by RT-PCR (Flu A&B, Covid) Nasopharyngeal Swab     Status: None   Collection Time: 01/08/21  5:32 PM   Specimen: Nasopharyngeal Swab; Nasopharyngeal(NP) swabs in vial transport medium  Result Value Ref Range Status   SARS Coronavirus 2 by RT PCR NEGATIVE NEGATIVE Final    Comment: (NOTE) SARS-CoV-2 target nucleic acids are NOT DETECTED.  The SARS-CoV-2 RNA is generally detectable in upper respiratory specimens during the acute phase of infection. The lowest concentration of SARS-CoV-2 viral copies this assay can detect is 138 copies/mL. A negative result does not preclude SARS-Cov-2 infection and should not be used as the sole basis for treatment or other patient management decisions. A negative result may occur with  improper specimen collection/handling, submission of specimen other than nasopharyngeal swab, presence of viral mutation(s) within the areas targeted by this assay, and inadequate number of viral copies(<138 copies/mL). A negative result must be combined with clinical observations, patient history, and epidemiological information. The expected result is  Negative.  Fact Sheet for Patients:  EntrepreneurPulse.com.au  Fact Sheet for Healthcare Providers:  IncredibleEmployment.be  This test is no t yet approved or cleared by the [place] FDA and  has been authorized for detection and/or diagnosis of SARS-CoV-2 by FDA under an Emergency Use Authorization (EUA). This EUA will remain  in effect (meaning this test can be used) for the duration of the COVID-19 declaration under Section 564(b)(1) of the Act, 21 U.S.C.section 360bbb-3(b)(1), unless the authorization is terminated  or revoked sooner.       Influenza A by PCR NEGATIVE NEGATIVE Final   Influenza B by PCR NEGATIVE NEGATIVE Final    Comment: (NOTE) The Xpert Xpress SARS-CoV-2/FLU/RSV plus assay is intended as an aid in the diagnosis of influenza from Nasopharyngeal swab specimens and should not be used as a sole basis for treatment. Nasal washings and aspirates are unacceptable for Xpert Xpress SARS-CoV-2/FLU/RSV testing.  Fact Sheet for Patients: EntrepreneurPulse.com.au  Fact Sheet for Healthcare Providers: IncredibleEmployment.be  This test is not yet approved or cleared by the [place] FDA and has been authorized for detection and/or diagnosis of SARS-CoV-2 by FDA under an Emergency Use Authorization (EUA). This EUA will remain in effect (meaning this test can be used) for the duration of the COVID-19 declaration under Section 564(b)(1) of the Act, 21 U.S.C. section 360bbb-3(b)(1), unless the authorization is terminated or revoked.  Performed at [facility], [address] [place], [place] [address]     Radiology Reports DG Chest 2 View  Result Date: 01/08/2021 CLINICAL DATA:  Fall today.  Right elbow fracture. EXAM: CHEST - 2 VIEW COMPARISON:  12/31/2017 FINDINGS: Stable mild cardiomegaly. Aortic atherosclerotic calcification noted. Both lungs are clear. IMPRESSION: Stable mild  cardiomegaly. No active lung disease. Electronically Signed   By: Marlaine Hind M.D.   On: 01/08/2021 17:04   DG Pelvis 1-2 Views  Result Date: 01/08/2021 CLINICAL DATA:  Pain after fall. EXAM: PELVIS - 1-2 VIEW COMPARISON:  None. FINDINGS: The patient is status post left hip replacement. Visualized hardware is in good position. No acute fractures are identified. Vascular calcifications are noted. IMPRESSION: No acute abnormalities. Electronically Signed   By: Dorise Bullion III M.D   On: 01/08/2021 17:01   DG Shoulder Right  Result Date: 01/08/2021 CLINICAL DATA:  Fall outside today.  Right upper extremity pain. EXAM: RIGHT SHOULDER - 2+ VIEW COMPARISON:  None. FINDINGS: There is no evidence of  fracture or dislocation. Mild acromioclavicular and glenohumeral degenerative change. Subcortical cystic change in the lateral humeral head suggests underlying rotator cuff arthropathy. Small subacromial spur. Calcifications projecting over the inferior glenoid. IMPRESSION: 1. No acute fracture or subluxation of the right shoulder. 2. Mild acromioclavicular and glenohumeral degenerative change. Small subacromial spur. 3. Subcortical cystic change in the lateral humeral head suggests underlying rotator cuff arthropathy. Electronically Signed   By: Keith Rake M.D.   On: 01/08/2021 16:55   DG Elbow Complete Right  Result Date: 01/08/2021 CLINICAL DATA:  Pain after fall. EXAM: RIGHT ELBOW - COMPLETE 3+ VIEW COMPARISON:  None. FINDINGS: There is a supracondylar fracture through the distal humerus. A joint effusion is identified. No other abnormalities. IMPRESSION: Supracondylar distal humeral fracture with a joint effusion. Electronically Signed   By: Dorise Bullion III M.D   On: 01/08/2021 16:54   DG Wrist Complete Right  Result Date: 01/08/2021 CLINICAL DATA:  Pain after fall EXAM: RIGHT WRIST - COMPLETE 3+ VIEW COMPARISON:  None. FINDINGS: There is no evidence of fracture or dislocation. There is no  evidence of arthropathy or other focal bone abnormality. Soft tissues are unremarkable. IMPRESSION: Negative. Electronically Signed   By: Dorise Bullion III M.D   On: 01/08/2021 16:55   CT Head Wo Contrast  Result Date: 01/08/2021 CLINICAL DATA:  Fall outside today. EXAM: CT HEAD WITHOUT CONTRAST TECHNIQUE: Contiguous axial images were obtained from the base of the skull through the vertex without intravenous contrast. COMPARISON:  Head CT 08/28/2019 FINDINGS: Brain: No intracranial hemorrhage, mass effect, or midline shift. No hydrocephalus. The basilar cisterns are patent. Stable degree of chronic small vessel ischemia from prior. No evidence of territorial infarct or acute ischemia. No extra-axial or intracranial fluid collection. Vascular: Atherosclerosis of skullbase vasculature without hyperdense vessel or abnormal calcification. Skull: No fracture or focal lesion. Sinuses/Orbits: Chronic opacification of bilateral mastoid air cells. No acute findings. Bilateral cataract resection. Other: None. IMPRESSION: 1. No acute intracranial abnormality. No skull fracture. 2. Stable degree of chronic small vessel ischemia. 3. Chronic opacification of bilateral mastoid air cells. Electronically Signed   By: Keith Rake M.D.   On: 01/08/2021 17:05   CT Cervical Spine Wo Contrast  Result Date: 01/08/2021 CLINICAL DATA:  Fall outside today. EXAM: CT CERVICAL SPINE WITHOUT CONTRAST TECHNIQUE: Multidetector CT imaging of the cervical spine was performed without intravenous contrast. Multiplanar CT image reconstructions were also generated. COMPARISON:  None. FINDINGS: Alignment: Straightening of normal lordosis. Trace degenerative anterolisthesis of C3 on C4 and retrolisthesis of C5 on C6. No traumatic subluxation. Skull base and vertebrae: No acute fracture. Vertebral body heights are maintained. The dens and skull base are intact. Soft tissues and spinal canal: No prevertebral fluid or swelling. No visible  canal hematoma. Disc levels: Degenerative disc disease C4-C5, C5-C6, and C6-C7 with disc space narrowing and endplate spurring. There is multilevel facet hypertrophy. Facet ankylosis at C2-C3 on the left. Upper chest: Mild emphysema.  No acute findings. Other: Carotid calcifications. IMPRESSION: Multilevel degenerative change in the cervical spine without acute fracture or subluxation. Electronically Signed   By: Keith Rake M.D.   On: 01/08/2021 17:08   DG Knee Complete 4 Views Left  Result Date: 01/08/2021 CLINICAL DATA:  Pain after fall EXAM: LEFT KNEE - COMPLETE 4+ VIEW COMPARISON:  None. FINDINGS: The patient is status post left knee replacement. Hardware is intact with no evidence of failure. No fractures are identified. No joint effusion. Vascular calcifications noted. IMPRESSION: Left knee replacement without  evidence of hardware failure. No fractures or effusions. Electronically Signed   By: Dorise Bullion III M.D   On: 01/08/2021 17:00   DG Knee Complete 4 Views Right  Result Date: 01/08/2021 CLINICAL DATA:  Pain after fall EXAM: RIGHT KNEE - COMPLETE 4+ VIEW COMPARISON:  None FINDINGS: The patient is status post right knee replacement. There is a comminuted fracture involving the lateral and medial aspects of the distal femur. A fat fluid level seen on the lateral view consistent with a hemarthrosis. The proximal fibula and proximal tibia are intact. IMPRESSION: Comminuted fracture of the distal lateral and medial femur. Fracture lines extend to the femoral component of the knee replacement. Hemarthrosis consistent with the distal femoral fracture. Electronically Signed   By: Dorise Bullion III M.D   On: 01/08/2021 16:59   DG HIP UNILAT WITH PELVIS 2-3 VIEWS LEFT  Result Date: 12/13/2020 [facility] Radiology Report Dictated by Dr. Aline Brochure Chief complaint 5-year follow-up on revision acetabulum total hip device secondary to postop dislocation Images: 3 views of the hip  1 is stable AP pelvis Acetabular cup is in good position with screw fixation there is no sign of loosening the stem is press-fit stem seems to be in good position without any loosening or change in position Impression stable left total hip status post revision of the acetabulum     CBC Recent Labs  Lab 01/08/21 1834 01/09/21 0656  WBC 9.0 10.7*  HGB 10.8* 10.3*  HCT 32.2* 32.0*  PLT 269 275  MCV 92.3 93.6  MCH 30.9 30.1  MCHC 33.5 32.2  RDW 13.0 13.2  LYMPHSABS 1.2  --   MONOABS 0.7  --   EOSABS 0.1  --   BASOSABS 0.0  --     Chemistries  Recent Labs  Lab 01/08/21 1834 01/09/21 0656  NA 134* 136  K 2.3* 2.6*  CL 101 103  CO2 23 21*  GLUCOSE 121* 136*  BUN 34* 32*  CREATININE 2.86* 2.42*  CALCIUM 8.1* 8.2*  MG 1.3* 1.5*  AST  --  13*  ALT  --  12  ALKPHOS  --  69  BILITOT  --  1.7*   ------------------------------------------------------------------------------------------------------------------ No results for input(s): CHOL, HDL, LDLCALC, TRIG, CHOLHDL, LDLDIRECT in the last 72 hours.  Lab Results  Component Value Date   HGBA1C 6.3 (H) 01/08/2021   ------------------------------------------------------------------------------------------------------------------ No results for input(s): TSH, T4TOTAL, T3FREE, THYROIDAB in the last 72 hours.  Invalid input(s): FREET3 ------------------------------------------------------------------------------------------------------------------ No results for input(s): VITAMINB12, FOLATE, FERRITIN, TIBC, IRON, RETICCTPCT in the last 72 hours.  Coagulation profile Recent Labs  Lab 01/09/21 0656  INR 1.0    No results for input(s): DDIMER in the last 72 hours.  Cardiac Enzymes No results for input(s): CKMB, TROPONINI, MYOGLOBIN in the last 168 hours.  Invalid input(s): CK ------------------------------------------------------------------------------------------------------------------ No results found for:  BNP   Roxan Hockey M.D on 01/09/2021 at 12:05 PM  Go to [email or web address] - for contact info  [facility] - Office  [phone]

## 2021-01-09 NOTE — Progress Notes (Signed)
Inserted 14 french foley. Maintained sterile field. This nurse, charge nurse Mardene Celeste) and nurse tech Lattie Haw) was present during placement. Clear yellow urine returned. Patient tolerated well

## 2021-01-09 NOTE — Consult Note (Signed)
NEW PROBLEM//OFFICE VISIT  Summary assessment and plan:   FRX RIGHT ELBOW -SPLINT CAST   FRX PERIPROSTHETIC/ DISTAL FEMUR NON DISPLACED// SPLINT: 4 WEEKS NWB  Chief Complaint  Patient presents with  . Fall    69 YO FEMALE PROGRESSIVE DEMENTIA  FELL IN THE YARD TRYING TO GET IN THE CAR   2 FALLS TOTALLY CONFUSED   MILD PAIN WITH REST BETTER WITH SPLINTING    Review of Systems  Unable to perform ROS: Dementia     Past Medical History:  Diagnosis Date  . Cancer ([place])    ovarian  . Carpal tunnel syndrome    Nocturnal and positional, bilateral  . COPD (chronic obstructive pulmonary disease) ([place])   . Degenerative joint disease   . Diabetes mellitus    Insulin-dependent diabetes  . Hypercholesteremia   . Hypertension     Past Surgical History:  Procedure Laterality Date  . ABDOMINAL HYSTERECTOMY     BSO  . ABDOMINAL SURGERY     for bleeding after hysterectomy  . CATARACT EXTRACTION W/PHACO Left 07/19/2018   Procedure: CATARACT EXTRACTION PHACO AND INTRAOCULAR LENS PLACEMENT (IOC) LEFT CDE:3.26;  Surgeon: Baruch Goldmann, MD;  Location: [facility];  Service: Ophthalmology;  Laterality: Left;  . CATARACT EXTRACTION W/PHACO Right 08/23/2018   Procedure: CATARACT EXTRACTION PHACO AND INTRAOCULAR LENS PLACEMENT RIGHT EYE;  Surgeon: Baruch Goldmann, MD;  Location: [facility];  Service: Ophthalmology;  Laterality: Right;  right  . CHOLECYSTECTOMY    . COLONOSCOPY  2007   Dr. Gala Romney: internal hemorrhoids, few scattered sigmoid diverticula  . COLONOSCOPY N/A 04/09/2014   IPJ:ASNKNLZ diverticulosis. Redundant colon. Single colonic polyp-removed as described above.  . ESOPHAGOGASTRODUODENOSCOPY  2008   Dr. Gala Romney: erosive esophagitis, patulous EG junction  . ESOPHAGOGASTRODUODENOSCOPY N/A 04/09/2014   JQB:HALPFXT reflux esophagitis. Schatzki's ring-status post dilation as described above. Hiatal hernia. Gastricerosions-status post gastric biopsy  . HIP CLOSED REDUCTION Left 01/15/2016    Procedure: CLOSED REDUCTION LEFT HIP PROSTHESIS;  Surgeon: Carole Civil, MD;  Location: [facility];  Service: Orthopedics;  Laterality: Left;  . left hip replacement  12/08/2015  . MALONEY DILATION N/A 04/09/2014   Procedure: Venia Minks DILATION;  Surgeon: Daneil Dolin, MD;  Location: [facility];  Service: Endoscopy;  Laterality: N/A;  . REPLACEMENT TOTAL KNEE     bilateral  . SAVORY DILATION N/A 04/09/2014   Procedure: SAVORY DILATION;  Surgeon: Daneil Dolin, MD;  [facility]: [facility];  Service: Endoscopy;  Laterality: N/A;  . TOENAIL EXCISION     R great toenail; artificial nail  . TOTAL HIP ARTHROPLASTY Left 12/08/2015   Procedure: TOTAL HIP ARTHROPLASTY;  Surgeon: Carole Civil, MD;  Location: [facility];  Service: Orthopedics;  Laterality: Left;  . TOTAL HIP REVISION Left 01/26/2016   Procedure: LEFT TOTAL HIP REVISION;  Surgeon: Carole Civil, MD;  Location: [facility];  Service: Orthopedics;  Laterality: Left;    Family History  Problem Relation Age of Onset  . Stroke Mother   . Diabetes Mother   . Diabetes Father   . Cancer Brother        Leukemia and bone cancer  . Colon cancer Neg Hx   . Heart disease Neg Hx    Social History   Tobacco Use  . Smoking status: Former Smoker    Packs/day: 1.00    Years: 54.00    Pack years: 54.00    Types: Cigarettes    Quit date: 01/17/2016    Years since quitting:  4.9  . Smokeless tobacco: Never Used  . Tobacco comment: Smoked [phone] up to 1 pack per day; she did stop  9 months during pregnancy  Vaping Use  . Vaping Use: Never used  Substance Use Topics  . Alcohol use: No    Alcohol/week: 0.0 standard drinks  . Drug use: No    Allergies  Allergen Reactions  . Gabapentin Other (See Comments)    suicidal thoughts  . Ibuprofen Other (See Comments)    Kidney problems per caregiver    Current Meds  Medication Sig  . aspirin EC 81 MG tablet Take 81 mg by mouth daily.  . chlorthalidone (HYGROTON) 25 MG tablet TAKE 1  TABLET BY MOUTH  DAILY (Patient taking differently: Take 25 mg by mouth daily.)  . donepezil (ARICEPT) 5 MG tablet Take 5 mg by mouth daily.  . hydrALAZINE (APRESOLINE) 25 MG tablet Take 25 mg by mouth 3 (three) times daily.  . insulin degludec (TRESIBA) 100 UNIT/ML FlexTouch Pen Inject 18 Units into the skin at bedtime.  . rosuvastatin (CRESTOR) 5 MG tablet Take 5 mg by mouth daily.  . sertraline (ZOLOFT) 50 MG tablet Take 50 mg by mouth daily.  . verapamil (CALAN-SR) 240 MG CR tablet Take 240 mg by mouth daily.     BP (!) 202/79 (BP Location: Left Arm)   Pulse (!) 58   Temp 98.8 F (37.1 C)   Resp 18   Ht 5\' 5"  (1.651 m)   Wt 85.3 kg   SpO2 100%   BMI 31.28 kg/m   Physical Exam  General appearance: Well-developed well-nourished no gross deformities  Cardiovascular normal pulse and perfusion normal color without edema  Neurologically o sensation loss or deficits or pathologic reflexes  Psychological: Awake alert and DISORIENTED PLACE AND TIME mood and affect normal  Skin no lacerations or ulcerations no nodularity no palpable masses, no erythema or nodularity  Musculoskeletal:   RT ELBOW: IN SPLINT; NEUROVASCULAR INTACT   RT LOWER EXTREM: NEUROVASCULAR INTACT    MEDICAL DECISION MAKING  A. FRX PERI-PROSTHETIC, DISTAL FEMUR /ABOVE TKA     FRX RT ELBOW [place] FEMUR   B. DATA ANALYSED:  IMAGING: Interpretation of images:  IMPACTED DISTAL FEMUR FRX ABOVE RT TKA   TRANSV SUPRA CONDYLAR ELBOW FRX     Meds ordered this encounter  Medications  . HYDROcodone-acetaminophen (NORCO/VICODIN) 5-325 MG per tablet 1 tablet  . potassium chloride SA (KLOR-CON) CR tablet 40 mEq  . sodium chloride 0.9 % bolus 1,000 mL  . acetaminophen (TYLENOL) tablet 650 mg  . morphine 2 MG/ML injection 2 mg  . magnesium sulfate IVPB 2 g 50 mL  . potassium chloride 10 mEq in 100 mL IVPB  . 0.9 %  sodium chloride infusion  . hydrALAZINE (APRESOLINE) tablet 25 mg  . rosuvastatin (CRESTOR)  tablet 5 mg  . verapamil (CALAN-SR) CR tablet 240 mg  . donepezil (ARICEPT) tablet 5 mg  . sertraline (ZOLOFT) tablet 50 mg  . hydrALAZINE (APRESOLINE) injection 10 mg  . insulin aspart (novoLOG) injection 0-9 Units    Order Specific Question:   Correction coverage:    Answer:   Sensitive (thin, NPO, renal)    Order Specific Question:   CBG < 70:    Answer:   Implement Hypoglycemia Standing Orders and refer to Hypoglycemia Standing Orders sidebar report    Order Specific Question:   CBG 70 - 120:    Answer:   0 units    Order Specific Question:  CBG 121 - 150:    Answer:   1 unit    Order Specific Question:   CBG 151 - 200:    Answer:   2 units    Order Specific Question:   CBG 201 - 250:    Answer:   3 units    Order Specific Question:   CBG 251 - 300:    Answer:   5 units    Order Specific Question:   CBG 301 - 350:    Answer:   7 units    Order Specific Question:   CBG 351 - 400    Answer:   9 units    Order Specific Question:   CBG > 400    Answer:   call MD and obtain STAT lab verification  . nicotine (NICODERM CQ - dosed in mg/24 hours) patch 21 mg  . hydrALAZINE (APRESOLINE) injection 10 mg  . potassium chloride 10 mEq in 100 mL IVPB  . potassium chloride SA (KLOR-CON) CR tablet 40 mEq      Arther Abbott, MD  01/09/2021 10:45 AM

## 2021-01-09 NOTE — Progress Notes (Signed)
PT Cancellation Note  Patient Details Name: [name] MRN: [id number] DOB: 01/17/1951   Cancelled Treatment:    Reason Eval/Treat Not Completed: Medical issues which prohibited therapy.  Patient awaiting surgery, will wait for new orders after surgery from Ortho MD.  8:49 AM, 01/09/21 Lonell Grandchild, MPT Physical Therapist with [facility] [phone] office [phone] mobile phone

## 2021-01-10 DIAGNOSIS — N179 Acute kidney failure, unspecified: Secondary | ICD-10-CM | POA: Diagnosis not present

## 2021-01-10 DIAGNOSIS — S7291XA Unspecified fracture of right femur, initial encounter for closed fracture: Secondary | ICD-10-CM | POA: Diagnosis not present

## 2021-01-10 DIAGNOSIS — F0390 Unspecified dementia without behavioral disturbance: Secondary | ICD-10-CM | POA: Diagnosis not present

## 2021-01-10 DIAGNOSIS — W19XXXA Unspecified fall, initial encounter: Secondary | ICD-10-CM | POA: Diagnosis not present

## 2021-01-10 LAB — BASIC METABOLIC PANEL
Anion gap: 11 (ref 5–15)
BUN: 34 mg/dL — ABNORMAL HIGH (ref 8–23)
CO2: 20 mmol/L — ABNORMAL LOW (ref 22–32)
Calcium: 9 mg/dL (ref 8.9–10.3)
Chloride: 106 mmol/L (ref 98–111)
Creatinine, Ser: 2.56 mg/dL — ABNORMAL HIGH (ref 0.44–1.00)
GFR, Estimated: 20 mL/min — ABNORMAL LOW (ref >60)
Glucose, Bld: 129 mg/dL — ABNORMAL HIGH (ref 70–99)
Potassium: 3.9 mmol/L (ref 3.5–5.1)
Sodium: 137 mmol/L (ref 135–145)

## 2021-01-10 LAB — CBC
HCT: 33.4 % — ABNORMAL LOW (ref 36.0–46.0)
Hemoglobin: 10.8 g/dL — ABNORMAL LOW (ref 12.0–15.0)
MCH: 30.8 pg (ref 26.0–34.0)
MCHC: 32.3 g/dL (ref 30.0–36.0)
MCV: 95.2 fL (ref 80.0–100.0)
Platelets: 270 10*3/uL (ref 150–400)
RBC: 3.51 MIL/uL — ABNORMAL LOW (ref 3.87–5.11)
RDW: 13.3 % (ref 11.5–15.5)
WBC: 11.4 10*3/uL — ABNORMAL HIGH (ref 4.0–10.5)
nRBC: 0 % (ref 0.0–0.2)

## 2021-01-10 LAB — GLUCOSE, CAPILLARY
Glucose-Capillary: 105 mg/dL — ABNORMAL HIGH (ref 70–99)
Glucose-Capillary: 121 mg/dL — ABNORMAL HIGH (ref 70–99)
Glucose-Capillary: 135 mg/dL — ABNORMAL HIGH (ref 70–99)
Glucose-Capillary: 141 mg/dL — ABNORMAL HIGH (ref 70–99)
Glucose-Capillary: 163 mg/dL — ABNORMAL HIGH (ref 70–99)

## 2021-01-10 NOTE — Progress Notes (Signed)
OT Cancellation Note  Patient Details Name: [name] MRN: [id number] DOB: 1951/04/06   Cancelled Treatment:    Reason Eval/Treat Not Completed: Medical issues which prohibited therapy.   Patient awaiting surgery, will wait for new orders after surgery from Ortho MD.  Larey Seat OT, MOT  Larey Seat 01/10/2021, 12:34 PM

## 2021-01-10 NOTE — Progress Notes (Signed)
Patient Demographics:    [name], is a 70 y.o. female, DOB - 11/11/1950, TMH:[id number]  Admit date - 01/08/2021   Admitting Physician Bernadette Hoit, DO  Outpatient Primary MD for the patient is [name], MD  LOS - 2   Chief Complaint  Patient presents with  . Fall        Subjective:    [name] today has no fevers, no emesis,  No chest pain,  -Pain control appears to be improving -BP also improving  Assessment  & Plan :    Principal Problem:   Right Distal femoral fracture/Rt Knee Fx/Periprosthetic Not Amenable to Operative Fixation Active Problems:   Right Distal Humeral Fracture/Rt Elbow Fracture-Not Amenable to Operative Fixation   Dementia without behavioral disturbance (HCC)   Essential hypertension   Hypokalemia   Hypomagnesemia   COPD (chronic obstructive pulmonary disease) (HCC)   Tobacco abuse   Anemia, unspecified   S/P hip replacement, 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 female with who is a reformed smoker with medical history significant for HTN, HLD, T2DM, dementia and obesity, COPD admitted on 01/08/2021 after falling at home with right distal humerus/elbow and right distal femur/knee fracture, not amenable to surgical fixation, requiring prolonged rehab  A/p 1)Rt distal femur/knee fracture----discussed with orthopedic surgeon Dr. Aline Brochure, who also discussed with orthopedic surgeon on-call at [facility]--- after reviewing imaging studies and further orthopedic consultation- -They advised rehab on conservative medical management -Apparently fracture location is Not amenable to operative fixation -Patient will have to be nonweightbearing for up to 90 days -Will need  rehab and pain control -Awaiting PT /OT eval  2)Right distal humerus/elbow fracture--- as per orthopedic surgeon not amenable to operative fixation -Casting and conservative treatment with rehab advised -Not weightbearing on right upper extremity for 90 days --Awaiting PT /OT eval  3)HTN---BP improving after medication adjustments and with improved pain control -Continue atenolol - Stopped chlorthalidone due to persistent electrolyte abnormalities -Stopped oral hydralazine -Stop verapamil due to concerns for bradycardia with concomitant use of atenolol -Continue amlodipine 10 mg daily avoid ACEI/ARB/ARNI due to renal concerns -Okay to use IV hydralazine as needed elevated BP  4)DM2-recent A1c prior A1c was 8.7 reflecting uncontrolled diabetes with hyperglycemia PTA -At home patient was supposed to be taking [place] but had not been compliant lately Use Novolog/Humalog Sliding scale insulin with Accu-Cheks/Fingersticks as ordered  5)AKI----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   -Creatinine is down to 2.4 from 2.86 on admission -Prior baseline was around 1.4 renally adjust medications, avoid nephrotoxic agents / dehydration  / hypotension -Stop chlorthalidone due to persistent electrolyte derangement  6)Hypokalemia/hyponatremia/hypomagnesemia--stop chlorthalidone,  -Replaced     7)Dementia with some cognitive and memory deficits--currently on Aricept -May use lorazepam as needed agitation  8)Recurrent falls---PTA pt lived alone and did very poorly, patient has significant limitations with mobility related ADLs- this patient needs to continue to be monitored in the hospital until a SNF bed is obtained as she is Not safe to go home with her current physcical limitations - right  knee and right elbow fractures, patient is nonweightbearing at this time due to acute fractures - status post fall at home, No safe discharge plan at this time* --Awaiting PT /OT eval  -9) tobacco  abuse----smoking cessation advised, may use nicotine patch  Disposition/Need for in-Hospital Stay- patient unable to be discharged at this time due to --- dehydration/electrolyte abnormalities with AKI requiring IV fluids, right knee and right elbow fractures, patient is nonweightbearing, status post fall at home, No safe discharge plan at this time--Awaiting PT /OT eval  Status is: Inpatient  Remains inpatient appropriate because:Please see disposition above   Disposition: The patient is from: Home              Anticipated d/c is to: SNF              Anticipated d/c date is: 2 days              Patient currently is not medically stable to d/c. Barriers: Not Clinically Stable-   Code Status : -  Code Status: Full Code   Family Communication:    (patient is alert, awake and coherent)  Discussed with son    Consults  :  ortho  DVT Prophylaxis  :   - SCDs SCDs Start: 01/08/21 2051    Lab Results  Component Value Date   PLT 270 01/10/2021    Inpatient Medications  Scheduled Meds: . amLODipine  10 mg Oral Daily  . atenolol  50 mg Oral QHS  . Chlorhexidine Gluconate Cloth  6 each Topical Daily  . donepezil  5 mg Oral Daily  . insulin aspart  0-9 Units Subcutaneous Q4H  . methocarbamol  750 mg Oral TID  . nicotine  21 mg Transdermal Daily  . polyethylene glycol  17 g Oral Daily  . rosuvastatin  5 mg Oral Daily  . senna-docusate  2 tablet Oral QHS  . sertraline  50 mg Oral Daily   Continuous Infusions: . 0.9 % NaCl with KCl 20 mEq / L 75 mL/hr at 01/10/21 0623   PRN Meds:.acetaminophen, hydrALAZINE, LORazepam, morphine injection, oxyCODONE    Anti-infectives (From admission, onward)   None        Objective:   Vitals:   01/09/21 2028 01/10/21 0642 01/10/21 0916 01/10/21 1427  BP: (!) 153/65 (!) 192/90 (!) 141/114 (!) 158/63  Pulse: (!) 51 (!) 54 (!) 55 87  Resp: 18 (!) 21  16  Temp: 98.6 F (37 C) 97.7 F (36.5 C)  98.1 F (36.7 C)  TempSrc:  Oral  Oral   SpO2: 99% 95%  100%  Weight:      Height:        Wt Readings from Last 3 Encounters:  01/08/21 85.3 kg  12/13/20 85.3 kg  12/15/19 95.7 kg     Intake/Output Summary (Last 24 hours) at 01/10/2021 1950 Last data filed at 01/10/2021 1821 Gross per 24 hour  Intake 1832.59 ml  Output 300 ml  Net 1532.59 ml     Physical Exam  Gen:- Awake Alert,  In no apparent distress  HEENT:- [place].AT, No sclera icterus Neck-Supple Neck,No JVD,.  Lungs-  CTAB , fair symmetrical air movement] CV- S1, S2 normal, regular  Abd-  +ve B.Sounds, Abd Soft, No tenderness,    Extremity/Skin:- No  edema, pedal pulses present  Psych-affect is flat, some baseline cognitive and memory deficits,   Neuro-generalized weakness, no new focal deficits, no tremors MSK-right elbow and right knee fractures as above with  braces/sling   Data Review:   Micro Results Recent Results (from the past 240 hour(s))  Resp Panel by RT-PCR (Flu A&B, Covid) Nasopharyngeal Swab     Status: None   Collection Time: 01/08/21  5:32 PM   Specimen: Nasopharyngeal Swab; Nasopharyngeal(NP) swabs in vial transport medium  Result Value Ref Range Status   SARS Coronavirus 2 by RT PCR NEGATIVE NEGATIVE Final    Comment: (NOTE) SARS-CoV-2 target nucleic acids are NOT DETECTED.  The SARS-CoV-2 RNA is generally detectable in upper respiratory specimens during the acute phase of infection. The lowest concentration of SARS-CoV-2 viral copies this assay can detect is 138 copies/mL. A negative result does not preclude SARS-Cov-2 infection and should not be used as the sole basis for treatment or other patient management decisions. A negative result may occur with  improper specimen collection/handling, submission of specimen other than nasopharyngeal swab, presence of viral mutation(s) within the areas targeted by this assay, and inadequate number of viral copies(<138 copies/mL). A negative result must be combined with clinical  observations, patient history, and epidemiological information. The expected result is Negative.  Fact Sheet for Patients:  EntrepreneurPulse.com.au  Fact Sheet for Healthcare Providers:  IncredibleEmployment.be  This test is no t yet approved or cleared by the [place] FDA and  has been authorized for detection and/or diagnosis of SARS-CoV-2 by FDA under an Emergency Use Authorization (EUA). This EUA will remain  in effect (meaning this test can be used) for the duration of the COVID-19 declaration under Section 564(b)(1) of the Act, 21 U.S.C.section 360bbb-3(b)(1), unless the authorization is terminated  or revoked sooner.       Influenza A by PCR NEGATIVE NEGATIVE Final   Influenza B by PCR NEGATIVE NEGATIVE Final    Comment: (NOTE) The Xpert Xpress SARS-CoV-2/FLU/RSV plus assay is intended as an aid in the diagnosis of influenza from Nasopharyngeal swab specimens and should not be used as a sole basis for treatment. Nasal washings and aspirates are unacceptable for Xpert Xpress SARS-CoV-2/FLU/RSV testing.  Fact Sheet for Patients: EntrepreneurPulse.com.au  Fact Sheet for Healthcare Providers: IncredibleEmployment.be  This test is not yet approved or cleared by the [place] FDA and has been authorized for detection and/or diagnosis of SARS-CoV-2 by FDA under an Emergency Use Authorization (EUA). This EUA will remain in effect (meaning this test can be used) for the duration of the COVID-19 declaration under Section 564(b)(1) of the Act, 21 U.S.C. section 360bbb-3(b)(1), unless the authorization is terminated or revoked.  Performed at [facility], [address] [place], [place] [address]     Radiology Reports DG Chest 2 View  Result Date: 01/08/2021 CLINICAL DATA:  Fall today.  Right elbow fracture. EXAM: CHEST - 2 VIEW COMPARISON:  12/31/2017 FINDINGS: Stable mild cardiomegaly. Aortic  atherosclerotic calcification noted. Both lungs are clear. IMPRESSION: Stable mild cardiomegaly. No active lung disease. Electronically Signed   By: Marlaine Hind M.D.   On: 01/08/2021 17:04   DG Pelvis 1-2 Views  Result Date: 01/08/2021 CLINICAL DATA:  Pain after fall. EXAM: PELVIS - 1-2 VIEW COMPARISON:  None. FINDINGS: The patient is status post left hip replacement. Visualized hardware is in good position. No acute fractures are identified. Vascular calcifications are noted. IMPRESSION: No acute abnormalities. Electronically Signed   By: Dorise Bullion III M.D   On: 01/08/2021 17:01   DG Shoulder Right  Result Date: 01/08/2021 CLINICAL DATA:  Fall outside today.  Right upper extremity pain. EXAM: RIGHT SHOULDER - 2+ VIEW COMPARISON:  None. FINDINGS: There is no evidence of fracture or dislocation. Mild acromioclavicular and glenohumeral degenerative change. Subcortical cystic change in the lateral humeral head suggests underlying rotator cuff arthropathy. Small subacromial spur. Calcifications projecting over the inferior glenoid. IMPRESSION: 1. No acute fracture or subluxation of the right shoulder. 2. Mild acromioclavicular and glenohumeral degenerative change. Small subacromial spur. 3. Subcortical cystic change in the lateral humeral head suggests underlying rotator cuff arthropathy. Electronically Signed   By: Keith Rake M.D.   On: 01/08/2021 16:55   DG Elbow Complete Right  Result Date: 01/08/2021 CLINICAL DATA:  Pain after fall. EXAM: RIGHT ELBOW - COMPLETE 3+ VIEW COMPARISON:  None. FINDINGS: There is a supracondylar fracture through the distal humerus. A joint effusion is identified. No other abnormalities. IMPRESSION: Supracondylar distal humeral fracture with a joint effusion. Electronically Signed   By: Dorise Bullion III M.D   On: 01/08/2021 16:54   DG Wrist Complete Right  Result Date: 01/08/2021 CLINICAL DATA:  Pain after fall EXAM: RIGHT WRIST - COMPLETE 3+ VIEW COMPARISON:   None. FINDINGS: There is no evidence of fracture or dislocation. There is no evidence of arthropathy or other focal bone abnormality. Soft tissues are unremarkable. IMPRESSION: Negative. Electronically Signed   By: Dorise Bullion III M.D   On: 01/08/2021 16:55   CT Head Wo Contrast  Result Date: 01/08/2021 CLINICAL DATA:  Fall outside today. EXAM: CT HEAD WITHOUT CONTRAST TECHNIQUE: Contiguous axial images were obtained from the base of the skull through the vertex without intravenous contrast. COMPARISON:  Head CT 08/28/2019 FINDINGS: Brain: No intracranial hemorrhage, mass effect, or midline shift. No hydrocephalus. The basilar cisterns are patent. Stable degree of chronic small vessel ischemia from prior. No evidence of territorial infarct or acute ischemia. No extra-axial or intracranial fluid collection. Vascular: Atherosclerosis of skullbase vasculature without hyperdense vessel or abnormal calcification. Skull: No fracture or focal lesion. Sinuses/Orbits: Chronic opacification of bilateral mastoid air cells. No acute findings. Bilateral cataract resection. Other: None. IMPRESSION: 1. No acute intracranial abnormality. No skull fracture. 2. Stable degree of chronic small vessel ischemia. 3. Chronic opacification of bilateral mastoid air cells. Electronically Signed   By: Keith Rake M.D.   On: 01/08/2021 17:05   CT Cervical Spine Wo Contrast  Result Date: 01/08/2021 CLINICAL DATA:  Fall outside today. EXAM: CT CERVICAL SPINE WITHOUT CONTRAST TECHNIQUE: Multidetector CT imaging of the cervical spine was performed without intravenous contrast. Multiplanar CT image reconstructions were also generated. COMPARISON:  None. FINDINGS: Alignment: Straightening of normal lordosis. Trace degenerative anterolisthesis of C3 on C4 and retrolisthesis of C5 on C6. No traumatic subluxation. Skull base and vertebrae: No acute fracture. Vertebral body heights are maintained. The dens and skull base are intact.  Soft tissues and spinal canal: No prevertebral fluid or swelling. No visible canal hematoma. Disc levels: Degenerative disc disease C4-C5, C5-C6, and C6-C7 with disc space narrowing and endplate spurring. There is multilevel facet hypertrophy. Facet ankylosis at C2-C3 on the left. Upper chest: Mild emphysema.  No acute findings. Other: Carotid calcifications. IMPRESSION: Multilevel degenerative change in the cervical spine without acute fracture or subluxation. Electronically Signed   By: Keith Rake M.D.   On: 01/08/2021 17:08   DG Knee Complete 4 Views Left  Result Date: 01/08/2021 CLINICAL DATA:  Pain after fall EXAM: LEFT KNEE - COMPLETE 4+ VIEW COMPARISON:  None. FINDINGS: The patient is status post left knee replacement. Hardware is intact with no evidence of failure. No fractures are identified. No joint effusion. Vascular  calcifications noted. IMPRESSION: Left knee replacement without evidence of hardware failure. No fractures or effusions. Electronically Signed   By: Dorise Bullion III M.D   On: 01/08/2021 17:00   DG Knee Complete 4 Views Right  Result Date: 01/08/2021 CLINICAL DATA:  Pain after fall EXAM: RIGHT KNEE - COMPLETE 4+ VIEW COMPARISON:  None FINDINGS: The patient is status post right knee replacement. There is a comminuted fracture involving the lateral and medial aspects of the distal femur. A fat fluid level seen on the lateral view consistent with a hemarthrosis. The proximal fibula and proximal tibia are intact. IMPRESSION: Comminuted fracture of the distal lateral and medial femur. Fracture lines extend to the femoral component of the knee replacement. Hemarthrosis consistent with the distal femoral fracture. Electronically Signed   By: Dorise Bullion III M.D   On: 01/08/2021 16:59   DG HIP UNILAT WITH PELVIS 2-3 VIEWS LEFT  Result Date: 12/13/2020 [facility] Radiology Report Dictated by Dr. Aline Brochure Chief complaint 5-year follow-up on revision  acetabulum total hip device secondary to postop dislocation Images: 3 views of the hip 1 is stable AP pelvis Acetabular cup is in good position with screw fixation there is no sign of loosening the stem is press-fit stem seems to be in good position without any loosening or change in position Impression stable left total hip status post revision of the acetabulum     CBC Recent Labs  Lab 01/08/21 1834 01/09/21 0656 01/10/21 0615  WBC 9.0 10.7* 11.4*  HGB 10.8* 10.3* 10.8*  HCT 32.2* 32.0* 33.4*  PLT 269 275 270  MCV 92.3 93.6 95.2  MCH 30.9 30.1 30.8  MCHC 33.5 32.2 32.3  RDW 13.0 13.2 13.3  LYMPHSABS 1.2  --   --   MONOABS 0.7  --   --   EOSABS 0.1  --   --   BASOSABS 0.0  --   --     Chemistries  Recent Labs  Lab 01/08/21 1834 01/09/21 0656 01/10/21 0615  NA 134* 136 137  K 2.3* 2.6* 3.9  CL 101 103 106  CO2 23 21* 20*  GLUCOSE 121* 136* 129*  BUN 34* 32* 34*  CREATININE 2.86* 2.42* 2.56*  CALCIUM 8.1* 8.2* 9.0  MG 1.3* 1.5*  --   AST  --  13*  --   ALT  --  12  --   ALKPHOS  --  69  --   BILITOT  --  1.7*  --    ------------------------------------------------------------------------------------------------------------------ No results for input(s): CHOL, HDL, LDLCALC, TRIG, CHOLHDL, LDLDIRECT in the last 72 hours.  Lab Results  Component Value Date   HGBA1C 6.3 (H) 01/08/2021   ------------------------------------------------------------------------------------------------------------------ No results for input(s): TSH, T4TOTAL, T3FREE, THYROIDAB in the last 72 hours.  Invalid input(s): FREET3 ------------------------------------------------------------------------------------------------------------------ No results for input(s): VITAMINB12, FOLATE, FERRITIN, TIBC, IRON, RETICCTPCT in the last 72 hours.  Coagulation profile Recent Labs  Lab 01/09/21 0656  INR 1.0    No results for input(s): DDIMER in the last 72 hours.  Cardiac Enzymes No  results for input(s): CKMB, TROPONINI, MYOGLOBIN in the last 168 hours.  Invalid input(s): CK ------------------------------------------------------------------------------------------------------------------ No results found for: BNP   Roxan Hockey M.D on 01/10/2021 at 7:50 PM  Go to [email or web address] - for contact info  [facility] - Office  [phone]

## 2021-01-11 DIAGNOSIS — S7291XA Unspecified fracture of right femur, initial encounter for closed fracture: Secondary | ICD-10-CM | POA: Diagnosis not present

## 2021-01-11 DIAGNOSIS — F0390 Unspecified dementia without behavioral disturbance: Secondary | ICD-10-CM | POA: Diagnosis not present

## 2021-01-11 LAB — BASIC METABOLIC PANEL
Anion gap: 9 (ref 5–15)
BUN: 35 mg/dL — ABNORMAL HIGH (ref 8–23)
CO2: 19 mmol/L — ABNORMAL LOW (ref 22–32)
Calcium: 9 mg/dL (ref 8.9–10.3)
Chloride: 109 mmol/L (ref 98–111)
Creatinine, Ser: 2.45 mg/dL — ABNORMAL HIGH (ref 0.44–1.00)
GFR, Estimated: 21 mL/min — ABNORMAL LOW (ref >60)
Glucose, Bld: 117 mg/dL — ABNORMAL HIGH (ref 70–99)
Potassium: 3.8 mmol/L (ref 3.5–5.1)
Sodium: 137 mmol/L (ref 135–145)

## 2021-01-11 LAB — GLUCOSE, CAPILLARY
Glucose-Capillary: 127 mg/dL — ABNORMAL HIGH (ref 70–99)
Glucose-Capillary: 123 mg/dL — ABNORMAL HIGH (ref 70–99)
Glucose-Capillary: 121 mg/dL — ABNORMAL HIGH (ref 70–99)
Glucose-Capillary: 122 mg/dL — ABNORMAL HIGH (ref 70–99)
Glucose-Capillary: 139 mg/dL — ABNORMAL HIGH (ref 70–99)

## 2021-01-11 MED ORDER — ENOXAPARIN SODIUM 60 MG/0.6ML ~~LOC~~ SOLN
60.0000 mg | SUBCUTANEOUS | Status: DC
  Administered 2021-01-11: 60 mg via SUBCUTANEOUS
  Filled 2021-01-11: qty 0.6

## 2021-01-11 NOTE — Plan of Care (Signed)
  Problem: Acute Rehab OT Goals (only OT should resolve) Goal: Pt. Will Perform Eating Flowsheets (Taken 01/11/2021 1238) Pt Will Perform Eating:  with min assist  sitting  with supervision Goal: Pt. Will Perform Grooming Flowsheets (Taken 01/11/2021 1238) Pt Will Perform Grooming:  with supervision  with min assist  sitting Goal: Pt. Will Perform Upper Body Dressing Flowsheets (Taken 01/11/2021 1238) Pt Will Perform Upper Body Dressing:  with min assist  sitting Goal: Pt. Will Perform Lower Body Dressing Flowsheets (Taken 01/11/2021 1238) Pt Will Perform Lower Body Dressing:  with mod assist  sitting/lateral leans  bed level  with adaptive equipment Goal: Pt. Will Transfer To Toilet Flowsheets (Taken 01/11/2021 1238) Pt Will Transfer to Toilet:  with mod assist  stand pivot transfer  squat pivot transfer  with transfer board  bedside commode  grab bars  Deborah Dondero OT, MOT

## 2021-01-11 NOTE — TOC Progression Note (Addendum)
Transition of Care [facility]) - Progression Note    Patient Details  Name: [name] MRN: [id number] Date of Birth: 04/22/1951  Transition of Care [facility]) CM/SW Contact  Boneta Lucks, RN Phone Number: 01/11/2021, 2:36 PM  Clinical Narrative:   [facility] did make a bed offer and starting Insurance Authorization.  Patient is active with [facility] - TOC updated [place] with DC plan.   Expected Discharge Plan: Skilled Nursing Facility Barriers to Discharge: Continued Medical Work up  Expected Discharge Plan and Services Expected Discharge Plan: [place]

## 2021-01-11 NOTE — Evaluation (Signed)
Physical Therapy Evaluation Patient Details Name: [name] MRN: [id number] DOB: 1951/02/08 Today's Date: 01/11/2021   History of Present Illness  HPI: [name] is a 70 y.o. female with medical history significant for hypertension, hyperlipidemia, T2DM, dementia and obesity who presents to the emergency department after sustaining a fall at home prior to arrival.  History was obtained from ED physician and son at bedside.  Per son, patient lives alone, but son checks in on her regularly, he took away her car keys due to dementia, a neighbor saw her fall while trying to get into locked car and landed on her right side.  She complained of right arm, right knee and right hip pain.  She was unable to get up after the fall, EMS was activated and patient was sent to the ED for further evaluation and management.    Clinical Impression  Patient demonstrates slow labored movement for sitting up at bedside with RUE in sling and inability to move RLE due to c/o severe pain.  Patient unable to stand using hemi-walker due to LLE weakness and NWB on RLE, required Max assist to scoot to EOB and laterally at bedside.  Patient put back to bed requiring Max assist to reposition.  Patient will benefit from continued physical therapy in hospital and recommended venue below to increase strength, balance, endurance for safe ADLs and gait.     Follow Up Recommendations SNF    Equipment Recommendations  None recommended by PT;Other (comment) (to be determined)    Recommendations for Other Services       Precautions / Restrictions Precautions Precautions: Fall Type of Shoulder Precautions: NWB R LE and R UE Required Braces or Orthoses: Sling Restrictions Weight Bearing Restrictions: Yes RUE Weight Bearing: Non weight bearing RLE Weight Bearing: Non weight bearing      Mobility  Bed Mobility Overal bed mobility: Needs Assistance Bed Mobility: Supine to Sit;Sit to Supine     Supine to sit:  Max assist Sit to supine: Max assist   General bed mobility comments: slow labored movement with RUE in sling and unable to move RLE due to c/o severe pain    Transfers                    Ambulation/Gait                Stairs            Wheelchair Mobility    Modified Rankin (Stroke Patients Only)       Balance Overall balance assessment: Needs assistance Sitting-balance support: Feet supported;Single extremity supported Sitting balance-Leahy Scale: Fair Sitting balance - Comments: seated at EOB                                     Pertinent Vitals/Pain Pain Assessment: Faces Faces Pain Scale: Hurts whole lot Pain Location: RUE, RLE Pain Descriptors / Indicators: Grimacing;Guarding;Crying;Sore Pain Intervention(s): Limited activity within patient's tolerance;Monitored during session;Premedicated before session    Grants expects to be discharged to:: Private residence Living Arrangements: Alone Available Help at Discharge: Family;Available PRN/intermittently Type of Home: House Home Access: Level entry     Home Layout: One level Home Equipment: Walker - 2 wheels;Bedside commode Additional Comments: Pt is a poor historian; unable to recall elements of prior home living.    Prior Function Level of Independence: Needs assistance   Gait /  Transfers Assistance Needed: household ambulator using RW  ADL's / Homemaking Assistance Needed: assisted by family        Hand Dominance   Dominant Hand: Right    Extremity/Trunk Assessment   Upper Extremity Assessment Upper Extremity Assessment: Defer to OT evaluation    Lower Extremity Assessment Lower Extremity Assessment: Generalized weakness;RLE deficits/detail;LLE deficits/detail RLE: Unable to fully assess due to pain RLE Sensation: WNL RLE Coordination: WNL LLE Deficits / Details: grossly 3+/5 LLE: Unable to fully assess due to pain LLE Sensation:  WNL LLE Coordination: WNL    Cervical / Trunk Assessment Cervical / Trunk Assessment: Normal  Communication   Communication: No difficulties  Cognition Arousal/Alertness: Awake/alert Behavior During Therapy: WFL for tasks assessed/performed Overall Cognitive Status: History of cognitive impairments - at baseline                                        General Comments      Exercises     Assessment/Plan    PT Assessment Patient needs continued PT services  PT Problem List Decreased strength;Decreased activity tolerance;Decreased balance;Decreased mobility       PT Treatment Interventions DME instruction;Functional mobility training;Therapeutic activities;Therapeutic exercise;Wheelchair mobility training;Patient/family education;Balance training    PT Goals (Current goals can be found in the Care Plan section)  Acute Rehab PT Goals Patient Stated Goal: return home PT Goal Formulation: With patient Time For Goal Achievement: 01/25/21 Potential to Achieve Goals: Fair    Frequency Min 3X/week   Barriers to discharge        Co-evaluation PT/OT/SLP Co-Evaluation/Treatment: Yes Reason for Co-Treatment: Complexity of the patient's impairments (multi-system involvement);To address functional/ADL transfers;For patient/therapist safety PT goals addressed during session: Mobility/safety with mobility;Proper use of DME;Balance OT goals addressed during session: ADL's and self-care       AM-PAC PT "6 Clicks" Mobility  Outcome Measure Help needed turning from your back to your side while in a flat bed without using bedrails?: A Lot Help needed moving from lying on your back to sitting on the side of a flat bed without using bedrails?: A Lot Help needed moving to and from a bed to a chair (including a wheelchair)?: Total Help needed standing up from a chair using your arms (e.g., wheelchair or bedside chair)?: Total Help needed to walk in hospital room?:  Total Help needed climbing 3-5 steps with a railing? : Total 6 Click Score: 8    End of Session   Activity Tolerance: Patient tolerated treatment well;Patient limited by pain;Patient limited by fatigue Patient left: in bed;with call bell/phone within reach;with bed alarm set;with nursing/sitter in room Nurse Communication: Mobility status PT Visit Diagnosis: Unsteadiness on feet (R26.81);Other abnormalities of gait and mobility (R26.89);Muscle weakness (generalized) (M62.81)    Time: 2841-3244 PT Time Calculation (min) (ACUTE ONLY): 32 min   Charges:   PT Evaluation $PT Eval Moderate Complexity: 1 Mod PT Treatments $Therapeutic Activity: 23-37 mins        11:24 AM, 01/11/21 Lonell Grandchild, MPT Physical Therapist with [facility] [phone] office [phone] mobile phone

## 2021-01-11 NOTE — Progress Notes (Signed)
Patient was offered to be set up for breakfast this morning and patient declined twice, stating that she was not hungry. Later she was offered again by NT and patient disliked her breakfast with the exception of the banana, patient was able to feed herself with no difficulty using her left arm. Patient is also able to pick up cup of drink from the table using her left arm and bring it to her mouth without difficulty. Family was in room and made aware of patients abilities and progress.

## 2021-01-11 NOTE — Progress Notes (Addendum)
Patient Demographics:    [name], is a 70 y.o. female, DOB - [birth year], BPZ:[id number]  Admit date - 01/08/2021   Admitting Physician Bernadette Hoit, DO  Outpatient Primary MD for the patient is Hannah Beard, MD  LOS - 3   Chief Complaint  Patient presents with  . Fall        Subjective:    [name] today has no fevers, no emesis,  No chest pain,  -No new concerns, --PT/OT eval appreciated awaiting insurance approval for transfer to SNF rehab  Assessment  & Plan :    Principal Problem:   Right Distal femoral fracture/Rt Knee Fx/Periprosthetic Not Amenable to Operative Fixation Active Problems:   Right Distal Humeral Fracture/Rt Elbow Fracture-Not Amenable to Operative Fixation   Dementia without behavioral disturbance (HCC)   Essential hypertension   Hypokalemia   Hypomagnesemia   COPD (chronic obstructive pulmonary disease) (HCC)   Tobacco abuse   Anemia, unspecified   S/P hip replacement, 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 female with who is a reformed smoker with medical history significant for HTN, HLD, T2DM, dementia and obesity, COPD admitted on 01/08/2021 after falling at home with right distal humerus/elbow and right distal femur/knee fracture, not amenable to surgical fixation, requiring prolonged rehab  A/p 1)Rt distal femur/knee fracture----discussed with orthopedic surgeon Dr. Aline Brochure, who also discussed with orthopedic surgeon on-call at [facility]--- after reviewing imaging studies and further orthopedic consultation- -They advised rehab on conservative medical management -Apparently fracture location is Not amenable to operative fixation -Patient will have to be  nonweightbearing for up to 90 days -Will need rehab and pain control -PT/OT eval appreciated awaiting insurance approval for transfer to SNF rehab  2)Right distal humerus/elbow fracture--- as per orthopedic surgeon not amenable to operative fixation -Casting and conservative treatment with rehab advised -Not weightbearing on right upper extremity for 90 days -PT/OT eval appreciated awaiting insurance approval for transfer to SNF rehab  3)HTN---BP improving after medication adjustments and with improved pain control -Continue atenolol - Stopped chlorthalidone due to persistent electrolyte abnormalities -Stopped oral hydralazine -Stop verapamil due to concerns for bradycardia with concomitant use of atenolol -Continue amlodipine 10 mg daily avoid ACEI/ARB/ARNI due to renal concerns -Okay to use IV hydralazine as needed elevated BP  4)DM2-recent A1c prior A1c was 8.7 reflecting uncontrolled diabetes with hyperglycemia PTA -At home patient was supposed to be taking [place] but had not been compliant lately Use Novolog/Humalog Sliding scale insulin with Accu-Cheks/Fingersticks as ordered  5)AKI----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   -Creatinine is down to 2.4 from 2.86 on admission -Prior baseline was around 1.4 renally adjust medications, avoid nephrotoxic agents / dehydration  / hypotension -Stopped chlorthalidone due to persistent electrolyte derangement  6)Hypokalemia/hyponatremia/hypomagnesemia--stopped chlorthalidone,  -Replaced lytes   7)Dementia with some cognitive and memory deficits--currently on Aricept -May use lorazepam as needed agitation  8)Recurrent falls---PTA pt lived alone and did very poorly, patient has significant limitations with mobility related ADLs- this patient needs to continue to be monitored in the hospital until a SNF  bed is obtained as she is Not safe to go home with her current physcical limitations - right knee and right elbow fractures, patient is  nonweightbearing at this time due to acute fractures - status post fall at home, No safe discharge plan at this time* -PT/OT eval appreciated awaiting insurance approval for transfer to SNF rehab  -9) tobacco abuse----smoking cessation advised, may use nicotine patch  10) Foley catheter in situ--- patient has fractures of the right lower and right upper extremities--nonweightbearing for at least 90 days -Patient having difficulty with purewick/bedbound/turning in bed -Given nonweightbearing status with acute fractures continue Foley catheter for now -Foley placed 01/10/2021, consider removing Foley on 01/12/21 if pain control is better prior to DC to SNF -As fracture pain control improves consider discontinuation of Foley and using purewick or bedpan instead  11)Meals--patient is nonweightbearing on the right upper extremity patient needs total assistance with feeding  12)Chronic Anemia--- hemoglobin stable at this time -Patient  has had chronic anemia for long time  Disposition/Need for in-Hospital Stay- patient unable to be discharged at this time due to --- dehydration/electrolyte abnormalities with AKI requiring IV fluids, right knee and right elbow fractures, patient is nonweightbearing, status post fall at home, No safe discharge plan at this time-- awaiting insurance approval for transfer to SNF rehab Status is: Inpatient  Remains inpatient appropriate because:Please see disposition above   Disposition: The patient is from: Home              Anticipated d/c is to: SNF              Anticipated d/c date is: 1 day              Patient currently is not medically stable to d/c. Barriers: Not Clinically Stable-   Code Status : -  Code Status: Full Code   Family Communication:    (patient is alert, awake and coherent)  Discussed with son    Consults  :  ortho  DVT Prophylaxis  :   - SCDs SCDs Start: 01/08/21 2051    Lab Results  Component Value Date   PLT 270 01/10/2021     Inpatient Medications  Scheduled Meds: . amLODipine  10 mg Oral Daily  . atenolol  50 mg Oral QHS  . Chlorhexidine Gluconate Cloth  6 each Topical Daily  . donepezil  5 mg Oral Daily  . enoxaparin (LOVENOX) injection  60 mg Subcutaneous Q24H  . insulin aspart  0-9 Units Subcutaneous Q4H  . methocarbamol  750 mg Oral TID  . nicotine  21 mg Transdermal Daily  . polyethylene glycol  17 g Oral Daily  . rosuvastatin  5 mg Oral Daily  . senna-docusate  2 tablet Oral QHS  . sertraline  50 mg Oral Daily   Continuous Infusions: . 0.9 % NaCl with KCl 20 mEq / L 75 mL/hr at 01/10/21 0623   PRN Meds:.acetaminophen, hydrALAZINE, LORazepam, morphine injection, oxyCODONE  Anti-infectives (From admission, onward)   None        Objective:   Vitals:   01/10/21 1427 01/10/21 2106 01/11/21 0550 01/11/21 1329  BP: (!) 158/63 (!) 190/77 (!) 174/80 (!) 152/65  Pulse: 87 (!) 58 62 (!) 57  Resp: 16 19 18 16   Temp: 98.1 F (36.7 C) 98.6 F (37 C) 98.5 F (36.9 C) 98.9 F (37.2 C)  TempSrc: Oral Oral  Oral  SpO2: 100% 95% 100% 91%  Weight:      Height:  Wt Readings from Last 3 Encounters:  01/08/21 85.3 kg  12/13/20 85.3 kg  12/15/19 95.7 kg     Intake/Output Summary (Last 24 hours) at 01/11/2021 1849 Last data filed at 01/11/2021 1418 Gross per 24 hour  Intake 840 ml  Output 1000 ml  Net -160 ml     Physical Exam  Gen:- Awake Alert,  In no apparent distress  HEENT:- [place].AT, No sclera icterus Neck-Supple Neck,No JVD,.  Lungs-  CTAB , fair symmetrical air movement] CV- S1, S2 normal, regular  Abd-  +ve B.Sounds, Abd Soft, No tenderness,    Extremity/Skin:- No  edema, pedal pulses present  Psych-affect is flat, some baseline cognitive and memory deficits,   Neuro-generalized weakness, no new focal deficits, no tremors MSK-right elbow and right knee fractures as above with braces/sling   Data Review:   Micro Results Recent Results (from the past 240 hour(s))   Resp Panel by RT-PCR (Flu A&B, Covid) Nasopharyngeal Swab     Status: None   Collection Time: 01/08/21  5:32 PM   Specimen: Nasopharyngeal Swab; Nasopharyngeal(NP) swabs in vial transport medium  Result Value Ref Range Status   SARS Coronavirus 2 by RT PCR NEGATIVE NEGATIVE Final    Comment: (NOTE) SARS-CoV-2 target nucleic acids are NOT DETECTED.  The SARS-CoV-2 RNA is generally detectable in upper respiratory specimens during the acute phase of infection. The lowest concentration of SARS-CoV-2 viral copies this assay can detect is 138 copies/mL. A negative result does not preclude SARS-Cov-2 infection and should not be used as the sole basis for treatment or other patient management decisions. A negative result may occur with  improper specimen collection/handling, submission of specimen other than nasopharyngeal swab, presence of viral mutation(s) within the areas targeted by this assay, and inadequate number of viral copies(<138 copies/mL). A negative result must be combined with clinical observations, patient history, and epidemiological information. The expected result is Negative.  Fact Sheet for Patients:  EntrepreneurPulse.com.au  Fact Sheet for Healthcare Providers:  IncredibleEmployment.be  This test is no t yet approved or cleared by the [place] FDA and  has been authorized for detection and/or diagnosis of SARS-CoV-2 by FDA under an Emergency Use Authorization (EUA). This EUA will remain  in effect (meaning this test can be used) for the duration of the COVID-19 declaration under Section 564(b)(1) of the Act, 21 U.S.C.section 360bbb-3(b)(1), unless the authorization is terminated  or revoked sooner.       Influenza A by PCR NEGATIVE NEGATIVE Final   Influenza B by PCR NEGATIVE NEGATIVE Final    Comment: (NOTE) The Xpert Xpress SARS-CoV-2/FLU/RSV plus assay is intended as an aid in the diagnosis of influenza from  Nasopharyngeal swab specimens and should not be used as a sole basis for treatment. Nasal washings and aspirates are unacceptable for Xpert Xpress SARS-CoV-2/FLU/RSV testing.  Fact Sheet for Patients: EntrepreneurPulse.com.au  Fact Sheet for Healthcare Providers: IncredibleEmployment.be  This test is not yet approved or cleared by the [place] FDA and has been authorized for detection and/or diagnosis of SARS-CoV-2 by FDA under an Emergency Use Authorization (EUA). This EUA will remain in effect (meaning this test can be used) for the duration of the COVID-19 declaration under Section 564(b)(1) of the Act, 21 U.S.C. section 360bbb-3(b)(1), unless the authorization is terminated or revoked.  Performed at [facility], [address] [place], [place] [address]     Radiology Reports DG Chest 2 View  Result Date: 01/08/2021 CLINICAL DATA:  Fall today.  Right elbow  fracture. EXAM: CHEST - 2 VIEW COMPARISON:  12/31/2017 FINDINGS: Stable mild cardiomegaly. Aortic atherosclerotic calcification noted. Both lungs are clear. IMPRESSION: Stable mild cardiomegaly. No active lung disease. Electronically Signed   By: Marlaine Hind M.D.   On: 01/08/2021 17:04   DG Pelvis 1-2 Views  Result Date: 01/08/2021 CLINICAL DATA:  Pain after fall. EXAM: PELVIS - 1-2 VIEW COMPARISON:  None. FINDINGS: The patient is status post left hip replacement. Visualized hardware is in good position. No acute fractures are identified. Vascular calcifications are noted. IMPRESSION: No acute abnormalities. Electronically Signed   By: Dorise Bullion III M.D   On: 01/08/2021 17:01   DG Shoulder Right  Result Date: 01/08/2021 CLINICAL DATA:  Fall outside today.  Right upper extremity pain. EXAM: RIGHT SHOULDER - 2+ VIEW COMPARISON:  None. FINDINGS: There is no evidence of fracture or dislocation. Mild acromioclavicular and glenohumeral degenerative change. Subcortical cystic change in  the lateral humeral head suggests underlying rotator cuff arthropathy. Small subacromial spur. Calcifications projecting over the inferior glenoid. IMPRESSION: 1. No acute fracture or subluxation of the right shoulder. 2. Mild acromioclavicular and glenohumeral degenerative change. Small subacromial spur. 3. Subcortical cystic change in the lateral humeral head suggests underlying rotator cuff arthropathy. Electronically Signed   By: Keith Rake M.D.   On: 01/08/2021 16:55   DG Elbow Complete Right  Result Date: 01/08/2021 CLINICAL DATA:  Pain after fall. EXAM: RIGHT ELBOW - COMPLETE 3+ VIEW COMPARISON:  None. FINDINGS: There is a supracondylar fracture through the distal humerus. A joint effusion is identified. No other abnormalities. IMPRESSION: Supracondylar distal humeral fracture with a joint effusion. Electronically Signed   By: Dorise Bullion III M.D   On: 01/08/2021 16:54   DG Wrist Complete Right  Result Date: 01/08/2021 CLINICAL DATA:  Pain after fall EXAM: RIGHT WRIST - COMPLETE 3+ VIEW COMPARISON:  None. FINDINGS: There is no evidence of fracture or dislocation. There is no evidence of arthropathy or other focal bone abnormality. Soft tissues are unremarkable. IMPRESSION: Negative. Electronically Signed   By: Dorise Bullion III M.D   On: 01/08/2021 16:55   CT Head Wo Contrast  Result Date: 01/08/2021 CLINICAL DATA:  Fall outside today. EXAM: CT HEAD WITHOUT CONTRAST TECHNIQUE: Contiguous axial images were obtained from the base of the skull through the vertex without intravenous contrast. COMPARISON:  Head CT 08/28/2019 FINDINGS: Brain: No intracranial hemorrhage, mass effect, or midline shift. No hydrocephalus. The basilar cisterns are patent. Stable degree of chronic small vessel ischemia from prior. No evidence of territorial infarct or acute ischemia. No extra-axial or intracranial fluid collection. Vascular: Atherosclerosis of skullbase vasculature without hyperdense vessel or  abnormal calcification. Skull: No fracture or focal lesion. Sinuses/Orbits: Chronic opacification of bilateral mastoid air cells. No acute findings. Bilateral cataract resection. Other: None. IMPRESSION: 1. No acute intracranial abnormality. No skull fracture. 2. Stable degree of chronic small vessel ischemia. 3. Chronic opacification of bilateral mastoid air cells. Electronically Signed   By: Keith Rake M.D.   On: 01/08/2021 17:05   CT Cervical Spine Wo Contrast  Result Date: 01/08/2021 CLINICAL DATA:  Fall outside today. EXAM: CT CERVICAL SPINE WITHOUT CONTRAST TECHNIQUE: Multidetector CT imaging of the cervical spine was performed without intravenous contrast. Multiplanar CT image reconstructions were also generated. COMPARISON:  None. FINDINGS: Alignment: Straightening of normal lordosis. Trace degenerative anterolisthesis of C3 on C4 and retrolisthesis of C5 on C6. No traumatic subluxation. Skull base and vertebrae: No acute fracture. Vertebral body heights are maintained. The dens  and skull base are intact. Soft tissues and spinal canal: No prevertebral fluid or swelling. No visible canal hematoma. Disc levels: Degenerative disc disease C4-C5, C5-C6, and C6-C7 with disc space narrowing and endplate spurring. There is multilevel facet hypertrophy. Facet ankylosis at C2-C3 on the left. Upper chest: Mild emphysema.  No acute findings. Other: Carotid calcifications. IMPRESSION: Multilevel degenerative change in the cervical spine without acute fracture or subluxation. Electronically Signed   By: Keith Rake M.D.   On: 01/08/2021 17:08   DG Knee Complete 4 Views Left  Result Date: 01/08/2021 CLINICAL DATA:  Pain after fall EXAM: LEFT KNEE - COMPLETE 4+ VIEW COMPARISON:  None. FINDINGS: The patient is status post left knee replacement. Hardware is intact with no evidence of failure. No fractures are identified. No joint effusion. Vascular calcifications noted. IMPRESSION: Left knee replacement  without evidence of hardware failure. No fractures or effusions. Electronically Signed   By: Dorise Bullion III M.D   On: 01/08/2021 17:00   DG Knee Complete 4 Views Right  Result Date: 01/08/2021 CLINICAL DATA:  Pain after fall EXAM: RIGHT KNEE - COMPLETE 4+ VIEW COMPARISON:  None FINDINGS: The patient is status post right knee replacement. There is a comminuted fracture involving the lateral and medial aspects of the distal femur. A fat fluid level seen on the lateral view consistent with a hemarthrosis. The proximal fibula and proximal tibia are intact. IMPRESSION: Comminuted fracture of the distal lateral and medial femur. Fracture lines extend to the femoral component of the knee replacement. Hemarthrosis consistent with the distal femoral fracture. Electronically Signed   By: Dorise Bullion III M.D   On: 01/08/2021 16:59   DG HIP UNILAT WITH PELVIS 2-3 VIEWS LEFT  Result Date: 12/13/2020 [facility] Radiology Report Dictated by Dr. Aline Brochure Chief complaint 5-year follow-up on revision acetabulum total hip device secondary to postop dislocation Images: 3 views of the hip 1 is stable AP pelvis Acetabular cup is in good position with screw fixation there is no sign of loosening the stem is press-fit stem seems to be in good position without any loosening or change in position Impression stable left total hip status post revision of the acetabulum     CBC Recent Labs  Lab 01/08/21 1834 01/09/21 0656 01/10/21 0615  WBC 9.0 10.7* 11.4*  HGB 10.8* 10.3* 10.8*  HCT 32.2* 32.0* 33.4*  PLT 269 275 270  MCV 92.3 93.6 95.2  MCH 30.9 30.1 30.8  MCHC 33.5 32.2 32.3  RDW 13.0 13.2 13.3  LYMPHSABS 1.2  --   --   MONOABS 0.7  --   --   EOSABS 0.1  --   --   BASOSABS 0.0  --   --     Chemistries  Recent Labs  Lab 01/08/21 1834 01/09/21 0656 01/10/21 0615 01/11/21 0903  NA 134* 136 137 137  K 2.3* 2.6* 3.9 3.8  CL 101 103 106 109  CO2 23 21* 20* 19*  GLUCOSE 121* 136*  129* 117*  BUN 34* 32* 34* 35*  CREATININE 2.86* 2.42* 2.56* 2.45*  CALCIUM 8.1* 8.2* 9.0 9.0  MG 1.3* 1.5*  --   --   AST  --  13*  --   --   ALT  --  12  --   --   ALKPHOS  --  69  --   --   BILITOT  --  1.7*  --   --    ------------------------------------------------------------------------------------------------------------------ No results for input(s): CHOL,  HDL, LDLCALC, TRIG, CHOLHDL, LDLDIRECT in the last 72 hours.  Lab Results  Component Value Date   HGBA1C 6.3 (H) 01/08/2021   ------------------------------------------------------------------------------------------------------------------ No results for input(s): TSH, T4TOTAL, T3FREE, THYROIDAB in the last 72 hours.  Invalid input(s): FREET3 ------------------------------------------------------------------------------------------------------------------ No results for input(s): VITAMINB12, FOLATE, FERRITIN, TIBC, IRON, RETICCTPCT in the last 72 hours.  Coagulation profile Recent Labs  Lab 01/09/21 0656  INR 1.0    No results for input(s): DDIMER in the last 72 hours.  Cardiac Enzymes No results for input(s): CKMB, TROPONINI, MYOGLOBIN in the last 168 hours.  Invalid input(s): CK ------------------------------------------------------------------------------------------------------------------ No results found for: BNP   Roxan Hockey M.D on 01/11/2021 at 6:49 PM  Go to [email or web address] - for contact info  [facility] - Office  [phone]

## 2021-01-11 NOTE — Plan of Care (Signed)
  Problem: Acute Rehab PT Goals(only PT should resolve) Goal: Pt will Roll Supine to Side Outcome: Progressing Flowsheets (Taken 01/11/2021 1126) Pt will Roll Supine to Side: with mod assist Goal: Pt Will Go Supine/Side To Sit Outcome: Progressing Flowsheets (Taken 01/11/2021 1126) Pt will go Supine/Side to Sit: with moderate assist Goal: Pt Will Go Sit To Supine/Side Outcome: Progressing Flowsheets (Taken 01/11/2021 1126) Pt will go Sit to Supine/Side: with moderate assist Goal: Patient Will Perform Sitting Balance Outcome: Progressing Flowsheets (Taken 01/11/2021 1126) Patient will perform sitting balance:  with supervision  with min guard assist Goal: Patient Will Transfer Sit To/From Stand Outcome: Progressing Goal: Pt Will Transfer Bed To Chair/Chair To Bed Outcome: Progressing   11:27 AM, 01/11/21 Lonell Grandchild, MPT Physical Therapist with [facility] [phone] office [phone] mobile phone

## 2021-01-11 NOTE — Plan of Care (Signed)
  Problem: Clinical Measurements: Goal: Ability to maintain clinical measurements within normal limits will improve Outcome: Progressing Goal: Will remain free from infection Outcome: Progressing Goal: Diagnostic test results will improve Outcome: Progressing   Problem: Activity: Goal: Risk for activity intolerance will decrease Outcome: Progressing   Problem: Nutrition: Goal: Adequate nutrition will be maintained Outcome: Progressing   Problem: Pain Managment: Goal: General experience of comfort will improve Outcome: Progressing

## 2021-01-11 NOTE — Progress Notes (Signed)
Patient set up for lunch, meat was cut and patient was able to feed herself with no difficulties.

## 2021-01-11 NOTE — NC FL2 (Signed)
[place] MEDICAID FL2 LEVEL OF CARE SCREENING TOOL     IDENTIFICATION  Patient Name: [name] Birthdate: 1951/07/02 Sex: female Admission Date (Current Location): 01/08/2021  [facility] and [place] Number:  [employer] and Address:  [facility] [address] [place]      Provider Number: [id number]  Attending Physician Name and Address:  Roxan Hockey, MD  Relative Name and Phone Number:  Mallie Mussel - [phone]    Current Level of Care: Hospital Recommended Level of Care: [place] Prior Approval Number:    Date Approved/Denied:   PASRR Number: [id number]  Discharge Plan: SNF    Current Diagnoses: Patient Active Problem List   Diagnosis Date Noted  . COPD (chronic obstructive pulmonary disease) ([place]) 01/09/2021  . Right Distal femoral fracture/Rt Knee Fx/Periprosthetic Not Amenable to Operative Fixation 01/08/2021  . Right Distal Humeral Fracture/Rt Elbow Fracture-Not Amenable to Operative Fixation 01/08/2021  . AKI (acute kidney injury) ([place]) 01/08/2021  . Hypokalemia 01/08/2021  . Fall at home, initial encounter 01/08/2021  . Hyperlipidemia 01/08/2021  . Type 2 diabetes mellitus without complication ([place]) 51/10/5850  . Obesity (BMI 30.0-34.9) 01/08/2021  . Dementia without behavioral disturbance ([place]) 01/08/2021  . Hypomagnesemia 01/08/2021  . Posterior vitreous detachment, both eyes 07/19/2020  . Moderate nonproliferative diabetic retinopathy of both eyes without macular edema associated with type 2 diabetes mellitus ([place]) 07/19/2020  . 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 Essential hypertension 02/10/2016  . Former smoker 02/01/2016  . Anemia, unspecified 02/01/2016  . Osteoarthritis of left hip   . GERD (gastroesophageal reflux disease) 04/02/2013  . Chronic bronchitis with acute exacerbation ([place]) 04/02/2013  . History of ovarian cancer 04/02/2013  . Tobacco  abuse 04/02/2013  . Spinal stenosis, lumbar region, with neurogenic claudication 02/27/2013  . Iliotibial band tendonitis 02/27/2013  . DEGENERATIVE DISC DISEASE, LUMBOSACRAL SPINE 12/09/2009  . H N P-LUMBAR 09/29/2009  . KNEE, ARTHRITIS, DEGEN./OSTEO 09/23/2008  . SCIATICA 05/14/2008  . Insulin dependent diabetes mellitus ([place]) 05/12/2008    Orientation RESPIRATION BLADDER Height & Weight     Self  Normal Indwelling catheter Weight: 85.3 kg Height:  5\' 5"  (165.1 cm)  BEHAVIORAL SYMPTOMS/MOOD NEUROLOGICAL BOWEL NUTRITION STATUS      Continent Diet (See DC summary)  AMBULATORY STATUS COMMUNICATION OF NEEDS Skin   Extensive Assist Verbally Normal                       Personal Care Assistance Level of Assistance  Bathing,Feeding,Dressing Bathing Assistance: Maximum assistance Feeding assistance: Maximum assistance Dressing Assistance: Maximum assistance     Functional Limitations Info             SPECIAL CARE FACTORS FREQUENCY  PT (By licensed PT)     PT Frequency: 5 times a week              Contractures Contractures Info: Not present    Additional Factors Info  Code Status,Allergies Code Status Info: Full Allergies Info: Gabapentin, ibuprofen           Current Medications (01/11/2021):  This is the current hospital active medication list Current Facility-Administered Medications  Medication Dose Route Frequency Provider Last Rate Last Admin  . 0.9 % NaCl with KCl 20 mEq/ L  infusion   Intravenous Continuous Roxan Hockey, MD 75 mL/hr at 01/10/21 0623 New Bag at 01/10/21 7782  . acetaminophen (TYLENOL) tablet 650 mg  650 mg Oral Q6H  PRN Bernadette Hoit, DO   650 mg at 01/10/21 2119  . amLODipine (NORVASC) tablet 10 mg  10 mg Oral Daily Emokpae, Courage, MD   10 mg at 01/11/21 0805  . atenolol (TENORMIN) tablet 50 mg  50 mg Oral QHS Emokpae, Courage, MD   50 mg at 01/10/21 2119  . Chlorhexidine Gluconate Cloth 2 % PADS 6 each  6 each Topical Daily  Roxan Hockey, MD   6 each at 01/11/21 [phone]  . donepezil (ARICEPT) tablet 5 mg  5 mg Oral Daily Adefeso, Oladapo, DO   5 mg at 01/11/21 0806  . hydrALAZINE (APRESOLINE) injection 10 mg  10 mg Intravenous Q6H PRN Emokpae, Courage, MD      . insulin aspart (novoLOG) injection 0-9 Units  0-9 Units Subcutaneous Q4H Adefeso, Oladapo, DO   1 Units at 01/11/21 0804  . LORazepam (ATIVAN) injection 0.5 mg  0.5 mg Intravenous Q6H PRN Emokpae, Courage, MD      . methocarbamol (ROBAXIN) tablet 750 mg  750 mg Oral TID Roxan Hockey, MD   750 mg at 01/11/21 0805  . morphine 2 MG/ML injection 2 mg  2 mg Intravenous Q4H PRN Adefeso, Oladapo, DO   2 mg at 01/10/21 2549  . nicotine (NICODERM CQ - dosed in mg/24 hours) patch 21 mg  21 mg Transdermal Daily Adefeso, Oladapo, DO   21 mg at 01/11/21 0806  . oxyCODONE (Oxy IR/ROXICODONE) immediate release tablet 5 mg  5 mg Oral Q4H PRN Denton Brick, Courage, MD   5 mg at 01/11/21 1031  . polyethylene glycol (MIRALAX / GLYCOLAX) packet 17 g  17 g Oral Daily Denton Brick, Courage, MD   17 g at 01/11/21 0806  . rosuvastatin (CRESTOR) tablet 5 mg  5 mg Oral Daily Adefeso, Oladapo, DO   5 mg at 01/11/21 0806  . senna-docusate (Senokot-S) tablet 2 tablet  2 tablet Oral QHS Roxan Hockey, MD   2 tablet at 01/10/21 2126  . sertraline (ZOLOFT) tablet 50 mg  50 mg Oral Daily Adefeso, Oladapo, DO   50 mg at 01/11/21 8264     Discharge Medications: Please see discharge summary for a list of discharge medications.  Relevant Imaging Results:  Relevant Lab Results:   Additional Information SS# [id number]  Boneta Lucks, RN

## 2021-01-11 NOTE — Evaluation (Signed)
Occupational Therapy Evaluation Patient Details Name: [name] MRN: [id number] DOB: 10/11/1950 Today's Date: 01/11/2021    History of Present Illness HPI: [name] is a 70 y.o. female with medical history significant for hypertension, hyperlipidemia, T2DM, dementia and obesity who presents to the emergency department after sustaining a fall at home prior to arrival.  History was obtained from ED physician and son at bedside.  Per son, patient lives alone, but son checks in on her regularly, he took away her car keys due to dementia, a neighbor saw her fall while trying to get into locked car and landed on her right side.  She complained of right arm, right knee and right hip pain.  She was unable to get up after the fall, EMS was activated and patient was sent to the ED for further evaluation and management.   Clinical Impression   Pt agreeable to OT/PT co-evaluation this date. Pt presented with significant pain via grimacing and moaning. Pt tolerated donning of sling to R UE while supine in bed with total assist. Pt required max A for bed mobility. Unable to complete sit to stand due to pt inability to complete while maintaining precautions for NWB on R LE. Pt will benefit from continued OT in the hospital and recommended venue below to increase strength, balance, and endurance for safe ADL's.     Follow Up Recommendations  SNF;Supervision/Assistance - 24 hour    Equipment Recommendations  None recommended by OT           Precautions / Restrictions Precautions Precautions: Fall Type of Shoulder Precautions: NWB R LE and R UE Required Braces or Orthoses: Sling Restrictions Weight Bearing Restrictions: Yes RUE Weight Bearing: Non weight bearing RLE Weight Bearing: Non weight bearing      Mobility Bed Mobility Overal bed mobility: Needs Assistance Bed Mobility: Supine to Sit;Sit to Supine     Supine to sit: Max assist Sit to supine: Max assist   General bed  mobility comments: slow labored movement with RUE in sling and unable to move RLE due to c/o severe pain    Transfers                      Balance Overall balance assessment: Needs assistance Sitting-balance support: Feet supported;Single extremity supported Sitting balance-Leahy Scale: Fair Sitting balance - Comments: seated at EOB                                   ADL either performed or assessed with clinical judgement   ADL Overall ADL's : Needs assistance/impaired                     Lower Body Dressing: Total assistance;Bed level Lower Body Dressing Details (indicate cue type and reason): total assist to don socks at bed level.                     Vision Baseline Vision/History:  (Pt presents with difficulty recalling history.)                  Pertinent Vitals/Pain Pain Assessment: Faces Faces Pain Scale: Hurts whole lot Pain Location: RUE, RLE Pain Descriptors / Indicators: Grimacing;Guarding;Crying;Sore Pain Intervention(s): Limited activity within patient's tolerance;Monitored during session;Premedicated before session     Hand Dominance Right   Extremity/Trunk Assessment Upper Extremity Assessment Upper Extremity Assessment: RUE deficits/detail RUE Deficits /  Details: NWB status R UE due to humeral fracture.   Lower Extremity Assessment Lower Extremity Assessment: Defer to PT evaluation   Cervical / Trunk Assessment Cervical / Trunk Assessment: Normal   Communication Communication Communication: No difficulties   Cognition Arousal/Alertness: Awake/alert Behavior During Therapy: WFL for tasks assessed/performed Overall Cognitive Status: History of cognitive impairments - at baseline                                                      Home Living Family/patient expects to be discharged to:: Private residence Living Arrangements: Alone Available Help at Discharge: Family;Available  PRN/intermittently Type of Home: House Home Access: Level entry     Home Layout: One level               Home Equipment: Walker - 2 wheels;Bedside commode   Additional Comments: Pt is a poor historian; unable to recall elements of prior home living.      Prior Functioning/Environment Level of Independence: Needs assistance  Gait / Transfers Assistance Needed: household ambulator using RW ADL's / Homemaking Assistance Needed: assisted by family            OT Problem List: Decreased strength;Decreased range of motion;Decreased activity tolerance;Impaired balance (sitting and/or standing);Decreased coordination;Impaired UE functional use;Pain      OT Treatment/Interventions: Self-care/ADL training;Therapeutic exercise;Manual therapy;Therapeutic activities;Patient/family education    OT Goals(Current goals can be found in the care plan section) Acute Rehab OT Goals Patient Stated Goal: return home OT Goal Formulation: With patient Time For Goal Achievement: 01/25/21 Potential to Achieve Goals: Fair  OT Frequency: Min 2X/week               Co-evaluation PT/OT/SLP Co-Evaluation/Treatment: Yes Reason for Co-Treatment: Complexity of the patient's impairments (multi-system involvement);To address functional/ADL transfers;For patient/therapist safety PT goals addressed during session: Mobility/safety with mobility;Proper use of DME;Balance OT goals addressed during session: ADL's and self-care                       End of Session    Activity Tolerance: Patient limited by pain Patient left: in bed;with call bell/phone within reach;with bed alarm set  OT Visit Diagnosis: Unsteadiness on feet (R26.81);Repeated falls (R29.6);Muscle weakness (generalized) (M62.81);History of falling (Z91.81);Pain Pain - Right/Left: Right Pain - part of body: Arm;Leg                Time: 5615-3794 OT Time Calculation (min): 33 min Charges:  OT General Charges $OT Visit: 1  Visit OT Evaluation $OT Eval Moderate Complexity: 1 Mod  [place] Ganoe OT, MOT   Larey Seat 01/11/2021, 12:33 PM

## 2021-01-11 NOTE — TOC Initial Note (Signed)
Transition of Care [facility]) - Initial/Assessment Note    Patient Details  Name: [name] MRN: [id number] Date of Birth: 09-13-1951  Transition of Care [facility]) CM/SW Contact:    Boneta Lucks, RN Phone Number: 01/11/2021, 11:17 AM  Clinical Narrative:  Patient admitted with Right Distal femoral fracture and Right Knee fracture.  Patient lives home alone.   TOC spoke with her son, he is agreeable, patient will need SNF for 90 days and plan is to return home. Patient can not feed herself at present time. PT is recommending SNF.  First choice is [facility]. FL2 completed and sent out. TOC to follow.         Expected Discharge Plan: Skilled Nursing Facility Barriers to Discharge: Continued Medical Work up   Patient Goals and CMS Choice Patient states their goals for this hospitalization and ongoing recovery are:: to go to rehab. CMS Medicare.gov Compare Post Acute Care list provided to:: Patient Represenative (must comment) Choice offered to / list presented to : Adult Children  Expected Discharge Plan and Services Expected Discharge Plan: [place] arrangements for the past 2 months: Single Family Home                  Prior Living Arrangements/Services Living arrangements for the past 2 months: Single Family Home Lives with:: Self Patient language and need for interpreter reviewed:: Yes        Need for Family Participation in Patient Care: Yes (Comment) Care giver support system in place?: Yes (comment) Current home services: DME Criminal Activity/Legal Involvement Pertinent to Current Situation/Hospitalization: No - Comment as needed  Activities of Daily Living   Permission Sought/Granted      Permission granted to share info w Relationship: Son     Emotional Assessment    Orientation: : Oriented to Self Alcohol / Substance Use: Not Applicable Psych Involvement: No (comment)  Admission diagnosis:  Hypokalemia [E87.6] AKI (acute kidney  injury) ([place]) [N17.9] Right femoral fracture ([place]) [S72.91XA] Fall, initial encounter [W19.XXXA] Closed fracture of distal end of right femur, unspecified fracture morphology, initial encounter ([place]) [S72.401A] Closed nondisplaced supracondylar fracture of distal end of right femur with intracondylar extension, initial encounter [facility]) [O97.353G] Patient Active Problem List   Diagnosis Date Noted  . COPD (chronic obstructive pulmonary disease) ([place]) 01/09/2021  . Right Distal femoral fracture/Rt Knee Fx/Periprosthetic Not Amenable to Operative Fixation 01/08/2021  . Right Distal Humeral Fracture/Rt Elbow Fracture-Not Amenable to Operative Fixation 01/08/2021  . AKI (acute kidney injury) ([place]) 01/08/2021  . Hypokalemia 01/08/2021  . Fall at home, initial encounter 01/08/2021  . Hyperlipidemia 01/08/2021  . Type 2 diabetes mellitus without complication ([place]) 99/24/2683  . Obesity (BMI 30.0-34.9) 01/08/2021  . Dementia without behavioral disturbance ([place]) 01/08/2021  . Hypomagnesemia 01/08/2021  . Posterior vitreous detachment, both eyes 07/19/2020  . Moderate nonproliferative diabetic retinopathy of both eyes without macular edema associated with type 2 diabetes mellitus ([place]) 07/19/2020  . 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 Essential hypertension 02/10/2016  . Former smoker 02/01/2016  . Anemia, unspecified 02/01/2016  . Osteoarthritis of left hip   . GERD (gastroesophageal reflux disease) 04/02/2013  . Chronic bronchitis with acute exacerbation ([place]) 04/02/2013  . History of ovarian cancer 04/02/2013  . Tobacco abuse 04/02/2013  . Spinal stenosis, lumbar region, with neurogenic claudication 02/27/2013  . Iliotibial band tendonitis 02/27/2013  . DEGENERATIVE DISC DISEASE, LUMBOSACRAL SPINE 12/09/2009  . H N P-LUMBAR 09/29/2009  .  KNEE, ARTHRITIS, DEGEN./OSTEO 09/23/2008  . SCIATICA 05/14/2008  . Insulin dependent diabetes mellitus ([place]) 05/12/2008    PCP:  [name], MD Pharmacy:   [facility] [address], [place] - [place] [address] [place] [address] Phone: [phone] Fax: [phone]  [facility] [address] [place] - [place] [address] [place] [place] [address] Phone: [phone] Fax: [phone]  [place], [place] [address], [address] [place], [place] [address] Phone: [phone] Fax: [phone]  [facility] [phone] - [place], [address] [place] [address] Phone: [phone] Fax: [phone]   Readmission Risk Interventions Readmission Risk Prevention Plan 01/11/2021  Medication Screening Complete  Transportation Screening Complete  Some recent data might be hidden

## 2021-01-12 DIAGNOSIS — F0390 Unspecified dementia without behavioral disturbance: Secondary | ICD-10-CM | POA: Diagnosis not present

## 2021-01-12 DIAGNOSIS — J449 Chronic obstructive pulmonary disease, unspecified: Secondary | ICD-10-CM

## 2021-01-12 DIAGNOSIS — N179 Acute kidney failure, unspecified: Secondary | ICD-10-CM | POA: Diagnosis not present

## 2021-01-12 DIAGNOSIS — S7291XA Unspecified fracture of right femur, initial encounter for closed fracture: Secondary | ICD-10-CM | POA: Diagnosis not present

## 2021-01-12 DIAGNOSIS — S42391K Other fracture of shaft of right humerus, subsequent encounter for fracture with nonunion: Secondary | ICD-10-CM

## 2021-01-12 LAB — GLUCOSE, CAPILLARY
Glucose-Capillary: 118 mg/dL — ABNORMAL HIGH (ref 70–99)
Glucose-Capillary: 113 mg/dL — ABNORMAL HIGH (ref 70–99)
Glucose-Capillary: 120 mg/dL — ABNORMAL HIGH (ref 70–99)
Glucose-Capillary: 113 mg/dL — ABNORMAL HIGH (ref 70–99)
Glucose-Capillary: 114 mg/dL — ABNORMAL HIGH (ref 70–99)
Glucose-Capillary: 139 mg/dL — ABNORMAL HIGH (ref 70–99)

## 2021-01-12 LAB — CBC
HCT: 28.8 % — ABNORMAL LOW (ref 36.0–46.0)
Hemoglobin: 9.2 g/dL — ABNORMAL LOW (ref 12.0–15.0)
MCH: 30.8 pg (ref 26.0–34.0)
MCHC: 31.9 g/dL (ref 30.0–36.0)
MCV: 96.3 fL (ref 80.0–100.0)
Platelets: 208 10*3/uL (ref 150–400)
RBC: 2.99 MIL/uL — ABNORMAL LOW (ref 3.87–5.11)
RDW: 13.3 % (ref 11.5–15.5)
WBC: 7.7 10*3/uL (ref 4.0–10.5)
nRBC: 0 % (ref 0.0–0.2)

## 2021-01-12 MED ORDER — ENOXAPARIN SODIUM 30 MG/0.3ML ~~LOC~~ SOLN
30.0000 mg | SUBCUTANEOUS | Status: DC
  Administered 2021-01-12 – 2021-01-20 (×9): 30 mg via SUBCUTANEOUS
  Filled 2021-01-12 (×9): qty 0.3

## 2021-01-12 NOTE — Progress Notes (Signed)
Removed foley catheter.

## 2021-01-12 NOTE — Plan of Care (Signed)
  Problem: Education: Goal: Knowledge of General Education information will improve Description: Including pain rating scale, medication(s)/side effects and non-pharmacologic comfort measures Outcome: Progressing   Problem: Clinical Measurements: Goal: Ability to maintain clinical measurements within normal limits will improve Outcome: Progressing   

## 2021-01-12 NOTE — TOC Progression Note (Signed)
Transition of Care [facility]) - Progression Note    Patient Details  Name: [name] MRN: [id number] Date of Birth: [birth year]  Transition of Care [facility]) CM/SW Contact  Salome Arnt, [place] Phone Number: 01/12/2021, 3:40 PM  Clinical Narrative:  Per Marianna Fuss at [facility], [place] has denied SNF. LCSW notified MD and provided number for peer-to-peer [phone], option 3). Awaiting return call from [facility]. LCSW updated pt's daughter-in-law, [name]. TOC will continue to follow.      Expected Discharge Plan: Skilled Nursing Facility Barriers to Discharge: Insurance Authorization  Expected Discharge Plan and Services Expected Discharge Plan: [place]       Living arrangements for the past 2 months: Single Family Home                                       Social Determinants of Health (SDOH) Interventions    Readmission Risk Interventions Readmission Risk Prevention Plan 01/11/2021  Medication Screening Complete  Transportation Screening Complete  Some recent data might be hidden

## 2021-01-12 NOTE — Progress Notes (Signed)
PLAN  XRAYS IN THE OFFICE IN 4 WEEKS   NO WB ON RT LEG OR RIGHT ARM    Objective: Vital signs in last 24 hours: Temp:  [98 F (36.7 C)-98.9 F (37.2 C)] 98.5 F (36.9 C) (04/27 0553) Pulse Rate:  [57-60] 60 (04/27 0553) Resp:  [16-19] 19 (04/27 0553) BP: (135-193)/(55-79) 135/55 (04/27 0553) SpO2:  [91 %-94 %] 94 % (04/27 0553)  Intake/Output from previous day: 04/26 0701 - 04/27 0700 In: 840 [P.O.:840] Out: -  Intake/Output this shift: No intake/output data recorded.  Recent Labs    01/10/21 0615 01/12/21 0635  HGB 10.8* 9.2*   Recent Labs    01/10/21 0615 01/12/21 0635  WBC 11.4* 7.7  RBC 3.51* 2.99*  HCT 33.4* 28.8*  PLT 270 208   Recent Labs    01/10/21 0615 01/11/21 0903  NA 137 137  K 3.9 3.8  CL 106 109  CO2 20* 19*  BUN 34* 35*  CREATININE 2.56* 2.45*  GLUCOSE 129* 117*  CALCIUM 9.0 9.0   No results for input(s): LABPT, INR in the last 72 hours.  Arther Abbott 01/12/2021, 7:41 AM

## 2021-01-12 NOTE — Care Management Important Message (Signed)
Important Message  Patient Details  Name: [name] MRN: [id number] Date of Birth: [birth year]   Medicare Important Message Given:  Yes     [name] 01/12/2021, 2:29 PM

## 2021-01-12 NOTE — Progress Notes (Addendum)
Patient Demographics:    [name], is a 70 y.o. female, DOB - Sep 11, 1951, FWY:[id number]  Admit date - 01/08/2021   Admitting Physician Bernadette Hoit, DO  Outpatient Primary MD for the patient is [name], MD  LOS - 4   Chief Complaint  Patient presents with  . Fall        Subjective:    [name] and examined this morning no acute distress.. Patient failed multiple voiding trials, Foley cath had to be replaced overnight for continued urinary retention.  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 female with who is a reformed smoker with medical history significant for HTN, HLD, T2DM, dementia and obesity, COPD admitted on 01/08/2021 after falling at home with right distal humerus/elbow and right distal femur/knee fracture, not amenable to surgical fixation, requiring prolonged rehab ----------------------------------------------------------------------------------------------------------------------------------  A/p 1)Rt distal femur/knee fracture- -remains to  have limited mobility of the right arm and right leg due to fractures  ---discussed with orthopedic surgeon Dr. Aline Brochure, who also discussed with orthopedic surgeon on-call at [facility]--- after reviewing imaging studies and further orthopedic consultation- -They advised rehab on conservative medical management -Apparently fracture location is Not amenable to operative fixation -Patient will have to be nonweightbearing for up to 90 days -Will need rehab and pain control -PT/OT eval --commended SNF, insurance has denied the patient. I have reached out to [facility] for peer to peer review did not get an answer, left a message no call back yet [phone] option 3. Her policy # is [id number].    2)Right distal humerus/elbow fracture- -immobile right upper extremity, -- as per orthopedic surgeon not amenable to operative fixation -Casting and conservative treatment with rehab advised -Not weightbearing on right upper extremity for 90 days -PT/OT eval-pending SNF  3)HTN-- -BP stable  -BP improving after medication adjustments and with improved pain control -Continue atenolol - Stopped chlorthalidone due to persistent electrolyte abnormalities -Stopped oral hydralazine -Stop verapamil due to concerns for bradycardia with concomitant use of atenolol -Continue amlodipine 10 mg daily - Avoiding  ACEI/ARB/ARNI due to renal concerns -Okay to use IV hydralazine as needed elevated BP  4)DM2-recent A1c prior A1c was 8.7 reflecting uncontrolled diabetes with hyperglycemia PTA -At home patient was supposed to be taking [place] but had not been compliant lately -We will continue CBGs q. 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   -Creatinine  2.86 on admission >>> 2.45 today -Prior baseline was around 1.4 renally adjust medications, avoid nephrotoxic agents / dehydration  / hypotension -Stopped chlorthalidone due to persistent electrolyte  derangement  6)Hypokalemia/hyponatremia/hypomagnesemia--stopped chlorthalidone,  -Replaced lytes   7)Dementia with some cognitive and memory deficits--currently on Aricept -May use lorazepam as needed agitation  8)Recurrent falls---PTA pt lived alone and did very poorly, patient has significant limitations with mobility related ADLs- this patient needs to continue to be monitored in the hospital until a SNF bed is obtained as she is Not safe to go home with her current physcical limitations - right knee and right elbow fractures, patient is nonweightbearing at this time due to acute fractures - status post fall at home, No safe discharge plan at this time* -PT/OT eval recommending SNF rehab  -9) tobacco abuse----smoking cessation advised, may use nicotine patch  10) recurrent urinary retention-Foley catheter in situ--- patient has fractures of the right lower and right upper extremities--nonweightbearing for at least 90 days -Patient having difficulty with purewick/bedbound/turning in bed -Given nonweightbearing status with acute fractures continue Foley catheter for now -Foley placed 01/10/2021, consider removing Foley  if pain control is better prior to DC to SNF Documented per nursing staff patient continues to have urinary retention,  Foley catheter will remain in place -As fracture pain control improves consider discontinuation of Foley and using purewick or bedpan instead  11)Meals--patient is nonweightbearing on the right upper extremity patient needs total assistance with feeding  12)Chronic Anemia--- hemoglobin stable at this time -Patient  has had chronic anemia for long time  Disposition/Need for in-Hospital Stay- patient unable to be discharged at this time due to ---  Progressive debility due to right knee right elbow fractures,   patient is nonweightbearing, status post fall at home, No safe discharge plan at this time-- awaiting insurance approval for transfer to SNF  rehab  I have reached out to [facility] for peer to peer review did not get an answer, left a message no call back yet Called [phone] option 3. Her policy # is [id number].  Addendum:  Just got a call back from [employer] Dr. Floreen Comber stating she is total care - needs a prolonged care and they will not cover the, per PT OT note needs total care cannot follow instructions.... I have placed a reconsult note for PT/OT     Status is: Inpatient  Remains inpatient appropriate because:Please see disposition above   Disposition: The patient is from: Home              Anticipated d/c is to: SNF              Anticipated d/c date is: 1 day              Patient currently is not medically stable to d/c. Barriers: Insurance approval  Code Status : -  Code Status: Full Code   Family Communication:    (patient is alert, awake and coherent)  Discussed with son    Consults  :  ortho  DVT Prophylaxis  :   - SCDs enoxaparin (LOVENOX) injection 30 mg Start: 01/12/21 2100 SCDs Start: 01/08/21 2051    Lab Results  Component Value Date   PLT 208 01/12/2021    Inpatient Medications  Scheduled Meds: . amLODipine  10 mg Oral Daily  . atenolol  50 mg Oral QHS  . Chlorhexidine Gluconate Cloth  6 each Topical Daily  . donepezil  5 mg Oral Daily  . enoxaparin (LOVENOX)  injection  30 mg Subcutaneous Q24H  . insulin aspart  0-9 Units Subcutaneous Q4H  . methocarbamol  750 mg Oral TID  . nicotine  21 mg Transdermal Daily  . polyethylene glycol  17 g Oral Daily  . rosuvastatin  5 mg Oral Daily  . senna-docusate  2 tablet Oral QHS  . sertraline  50 mg Oral Daily   Continuous Infusions: . 0.9 % NaCl with KCl 20 mEq / L 75 mL/hr at 01/10/21 0623   PRN Meds:.acetaminophen, hydrALAZINE, LORazepam, morphine injection, oxyCODONE  Anti-infectives (From admission, onward)   None        Objective:   Vitals:   01/11/21 1329 01/11/21 2214 01/12/21 0553 01/12/21 1422  BP: (!) 152/65 (!) 193/79  (!) 135/55 (!) 164/70  Pulse: (!) 57 60 60 (!) 57  Resp: 16 18 19 15   Temp: 98.9 F (37.2 C) 98 F (36.7 C) 98.5 F (36.9 C) 98.9 F (37.2 C)  TempSrc: Oral Oral  Axillary  SpO2: 91% 94% 94% 98%  Weight:      Height:        Wt Readings from Last 3 Encounters:  01/08/21 85.3 kg  12/13/20 85.3 kg  12/15/19 95.7 kg     Intake/Output Summary (Last 24 hours) at 01/12/2021 1439 Last data filed at 01/12/2021 0900 Gross per 24 hour  Intake 120 ml  Output --  Net 120 ml       Physical Exam:   General:  Alert,  somewhat confused oriented x1 no acute distress cooperative  HEENT:  Normocephalic, PERRL, otherwise with in Normal limits   Neuro:  CNII-XII intact. , normal motor and sensation, reflexes intact   Lungs:   Clear to auscultation BL, Respirations unlabored, no wheezes / crackles  Cardio:    S1/S2, RRR, No murmure, No Rubs or Gallops   Abdomen:   Soft, non-tender, bowel sounds active all four quadrants,  no guarding or peritoneal signs.  Muscular skeletal:  right elbow and right knee fractures as above with braces/sling Limited exam - in bed, able to move all  extremities,   2+ pulses,  symmetric, No pitting edema  Skin:  Dry, warm to touch, negative for any Rashes,  Wounds: Please see nursing documentation           Data Review:   Micro Results Recent Results (from the past 240 hour(s))  Resp Panel by RT-PCR (Flu A&B, Covid) Nasopharyngeal Swab     Status: None   Collection Time: 01/08/21  5:32 PM   Specimen: Nasopharyngeal Swab; Nasopharyngeal(NP) swabs in vial transport medium  Result Value Ref Range Status   SARS Coronavirus 2 by RT PCR NEGATIVE NEGATIVE Final    Comment: (NOTE) SARS-CoV-2 target nucleic acids are NOT DETECTED.  The SARS-CoV-2 RNA is generally detectable in upper respiratory specimens during the acute phase of infection. The lowest concentration of SARS-CoV-2 viral copies this assay can detect is 138 copies/mL. A negative result does not  preclude SARS-Cov-2 infection and should not be used as the sole basis for treatment or other patient management decisions. A negative result may occur with  improper specimen collection/handling, submission of specimen other than nasopharyngeal swab, presence of viral mutation(s) within the areas targeted by this assay, and inadequate number of viral copies(<138 copies/mL). A negative result must be combined with clinical observations, patient history, and epidemiological information. The expected result is Negative.  Fact Sheet for Patients:  EntrepreneurPulse.com.au  Fact Sheet for Healthcare Providers:  IncredibleEmployment.be  This test  is no t yet approved or cleared by the [place] and  has been authorized for detection and/or diagnosis of SARS-CoV-2 by FDA under an Emergency Use Authorization (EUA). This EUA will remain  in effect (meaning this test can be used) for the duration of the COVID-19 declaration under Section 564(b)(1) of the Act, 21 U.S.C.section 360bbb-3(b)(1), unless the authorization is terminated  or revoked sooner.       Influenza A by PCR NEGATIVE NEGATIVE Final   Influenza B by PCR NEGATIVE NEGATIVE Final    Comment: (NOTE) The Xpert Xpress SARS-CoV-2/FLU/RSV plus assay is intended as an aid in the diagnosis of influenza from Nasopharyngeal swab specimens and should not be used as a sole basis for treatment. Nasal washings and aspirates are unacceptable for Xpert Xpress SARS-CoV-2/FLU/RSV testing.  Fact Sheet for Patients: EntrepreneurPulse.com.au  Fact Sheet for Healthcare Providers: IncredibleEmployment.be  This test is not yet approved or cleared by the [place] FDA and has been authorized for detection and/or diagnosis of SARS-CoV-2 by FDA under an Emergency Use Authorization (EUA). This EUA will remain in effect (meaning this test can be used) for the  duration of the COVID-19 declaration under Section 564(b)(1) of the Act, 21 U.S.C. section 360bbb-3(b)(1), unless the authorization is terminated or revoked.  Performed at [facility], [address] [place], [place] [address]     Radiology Reports DG Chest 2 View  Result Date: 01/08/2021 CLINICAL DATA:  Fall today.  Right elbow fracture. EXAM: CHEST - 2 VIEW COMPARISON:  12/31/2017 FINDINGS: Stable mild cardiomegaly. Aortic atherosclerotic calcification noted. Both lungs are clear. IMPRESSION: Stable mild cardiomegaly. No active lung disease. Electronically Signed   By: Marlaine Hind M.D.   On: 01/08/2021 17:04   DG Pelvis 1-2 Views  Result Date: 01/08/2021 CLINICAL DATA:  Pain after fall. EXAM: PELVIS - 1-2 VIEW COMPARISON:  None. FINDINGS: The patient is status post left hip replacement. Visualized hardware is in good position. No acute fractures are identified. Vascular calcifications are noted. IMPRESSION: No acute abnormalities. Electronically Signed   By: Dorise Bullion III M.D   On: 01/08/2021 17:01   DG Shoulder Right  Result Date: 01/08/2021 CLINICAL DATA:  Fall outside today.  Right upper extremity pain. EXAM: RIGHT SHOULDER - 2+ VIEW COMPARISON:  None. FINDINGS: There is no evidence of fracture or dislocation. Mild acromioclavicular and glenohumeral degenerative change. Subcortical cystic change in the lateral humeral head suggests underlying rotator cuff arthropathy. Small subacromial spur. Calcifications projecting over the inferior glenoid. IMPRESSION: 1. No acute fracture or subluxation of the right shoulder. 2. Mild acromioclavicular and glenohumeral degenerative change. Small subacromial spur. 3. Subcortical cystic change in the lateral humeral head suggests underlying rotator cuff arthropathy. Electronically Signed   By: Keith Rake M.D.   On: 01/08/2021 16:55   DG Elbow Complete Right  Result Date: 01/08/2021 CLINICAL DATA:  Pain after fall. EXAM: RIGHT ELBOW -  COMPLETE 3+ VIEW COMPARISON:  None. FINDINGS: There is a supracondylar fracture through the distal humerus. A joint effusion is identified. No other abnormalities. IMPRESSION: Supracondylar distal humeral fracture with a joint effusion. Electronically Signed   By: Dorise Bullion III M.D   On: 01/08/2021 16:54   DG Wrist Complete Right  Result Date: 01/08/2021 CLINICAL DATA:  Pain after fall EXAM: RIGHT WRIST - COMPLETE 3+ VIEW COMPARISON:  None. FINDINGS: There is no evidence of fracture or dislocation. There is no evidence of arthropathy or other focal bone abnormality. Soft tissues are unremarkable. IMPRESSION: Negative.  Electronically Signed   By: Dorise Bullion III M.D   On: 01/08/2021 16:55   CT Head Wo Contrast  Result Date: 01/08/2021 CLINICAL DATA:  Fall outside today. EXAM: CT HEAD WITHOUT CONTRAST TECHNIQUE: Contiguous axial images were obtained from the base of the skull through the vertex without intravenous contrast. COMPARISON:  Head CT 08/28/2019 FINDINGS: Brain: No intracranial hemorrhage, mass effect, or midline shift. No hydrocephalus. The basilar cisterns are patent. Stable degree of chronic small vessel ischemia from prior. No evidence of territorial infarct or acute ischemia. No extra-axial or intracranial fluid collection. Vascular: Atherosclerosis of skullbase vasculature without hyperdense vessel or abnormal calcification. Skull: No fracture or focal lesion. Sinuses/Orbits: Chronic opacification of bilateral mastoid air cells. No acute findings. Bilateral cataract resection. Other: None. IMPRESSION: 1. No acute intracranial abnormality. No skull fracture. 2. Stable degree of chronic small vessel ischemia. 3. Chronic opacification of bilateral mastoid air cells. Electronically Signed   By: Keith Rake M.D.   On: 01/08/2021 17:05   CT Cervical Spine Wo Contrast  Result Date: 01/08/2021 CLINICAL DATA:  Fall outside today. EXAM: CT CERVICAL SPINE WITHOUT CONTRAST TECHNIQUE:  Multidetector CT imaging of the cervical spine was performed without intravenous contrast. Multiplanar CT image reconstructions were also generated. COMPARISON:  None. FINDINGS: Alignment: Straightening of normal lordosis. Trace degenerative anterolisthesis of C3 on C4 and retrolisthesis of C5 on C6. No traumatic subluxation. Skull base and vertebrae: No acute fracture. Vertebral body heights are maintained. The dens and skull base are intact. Soft tissues and spinal canal: No prevertebral fluid or swelling. No visible canal hematoma. Disc levels: Degenerative disc disease C4-C5, C5-C6, and C6-C7 with disc space narrowing and endplate spurring. There is multilevel facet hypertrophy. Facet ankylosis at C2-C3 on the left. Upper chest: Mild emphysema.  No acute findings. Other: Carotid calcifications. IMPRESSION: Multilevel degenerative change in the cervical spine without acute fracture or subluxation. Electronically Signed   By: Keith Rake M.D.   On: 01/08/2021 17:08   DG Knee Complete 4 Views Left  Result Date: 01/08/2021 CLINICAL DATA:  Pain after fall EXAM: LEFT KNEE - COMPLETE 4+ VIEW COMPARISON:  None. FINDINGS: The patient is status post left knee replacement. Hardware is intact with no evidence of failure. No fractures are identified. No joint effusion. Vascular calcifications noted. IMPRESSION: Left knee replacement without evidence of hardware failure. No fractures or effusions. Electronically Signed   By: Dorise Bullion III M.D   On: 01/08/2021 17:00   DG Knee Complete 4 Views Right  Result Date: 01/08/2021 CLINICAL DATA:  Pain after fall EXAM: RIGHT KNEE - COMPLETE 4+ VIEW COMPARISON:  None FINDINGS: The patient is status post right knee replacement. There is a comminuted fracture involving the lateral and medial aspects of the distal femur. A fat fluid level seen on the lateral view consistent with a hemarthrosis. The proximal fibula and proximal tibia are intact. IMPRESSION: Comminuted  fracture of the distal lateral and medial femur. Fracture lines extend to the femoral component of the knee replacement. Hemarthrosis consistent with the distal femoral fracture. Electronically Signed   By: Dorise Bullion III M.D   On: 01/08/2021 16:59     CBC Recent Labs  Lab 01/08/21 1834 01/09/21 0656 01/10/21 0615 01/12/21 0635  WBC 9.0 10.7* 11.4* 7.7  HGB 10.8* 10.3* 10.8* 9.2*  HCT 32.2* 32.0* 33.4* 28.8*  PLT 269 275 270 208  MCV 92.3 93.6 95.2 96.3  MCH 30.9 30.1 30.8 30.8  MCHC 33.5 32.2 32.3 31.9  RDW  13.0 13.2 13.3 13.3  LYMPHSABS 1.2  --   --   --   MONOABS 0.7  --   --   --   EOSABS 0.1  --   --   --   BASOSABS 0.0  --   --   --     Chemistries  Recent Labs  Lab 01/08/21 1834 01/09/21 0656 01/10/21 0615 01/11/21 0903  NA 134* 136 137 137  K 2.3* 2.6* 3.9 3.8  CL 101 103 106 109  CO2 23 21* 20* 19*  GLUCOSE 121* 136* 129* 117*  BUN 34* 32* 34* 35*  CREATININE 2.86* 2.42* 2.56* 2.45*  CALCIUM 8.1* 8.2* 9.0 9.0  MG 1.3* 1.5*  --   --   AST  --  13*  --   --   ALT  --  12  --   --   ALKPHOS  --  69  --   --   BILITOT  --  1.7*  --   --    ------------------------------------------------------------------------------------------------------------------ No results for input(s): CHOL, HDL, LDLCALC, TRIG, CHOLHDL, LDLDIRECT in the last 72 hours.  Lab Results  Component Value Date   HGBA1C 6.3 (H) 01/08/2021   ------------------------------------------------------------------------------------------------------------------ No results for input(s): TSH, T4TOTAL, T3FREE, THYROIDAB in the last 72 hours.  Invalid input(s): FREET3 ------------------------------------------------------------------------------------------------------------------ No results for input(s): VITAMINB12, FOLATE, FERRITIN, TIBC, IRON, RETICCTPCT in the last 72 hours.  Coagulation profile Recent Labs  Lab 01/09/21 0656  INR 1.0    No results for input(s): DDIMER in the last 72  hours.  Cardiac Enzymes No results for input(s): CKMB, TROPONINI, MYOGLOBIN in the last 168 hours.  Invalid input(s): CK ------------------------------------------------------------------------------------------------------------------ No results found for: BNP   Deatra James M.D on 01/12/2021 at 2:39 PM  Go to [email or web address] - for contact info  [facility] - Office  [phone]

## 2021-01-13 DIAGNOSIS — S7291XA Unspecified fracture of right femur, initial encounter for closed fracture: Secondary | ICD-10-CM | POA: Diagnosis not present

## 2021-01-13 DIAGNOSIS — D649 Anemia, unspecified: Secondary | ICD-10-CM

## 2021-01-13 DIAGNOSIS — J449 Chronic obstructive pulmonary disease, unspecified: Secondary | ICD-10-CM | POA: Diagnosis not present

## 2021-01-13 DIAGNOSIS — N179 Acute kidney failure, unspecified: Secondary | ICD-10-CM | POA: Diagnosis not present

## 2021-01-13 LAB — BASIC METABOLIC PANEL
Anion gap: 7 (ref 5–15)
BUN: 35 mg/dL — ABNORMAL HIGH (ref 8–23)
CO2: 18 mmol/L — ABNORMAL LOW (ref 22–32)
Calcium: 8.6 mg/dL — ABNORMAL LOW (ref 8.9–10.3)
Chloride: 112 mmol/L — ABNORMAL HIGH (ref 98–111)
Creatinine, Ser: 2.28 mg/dL — ABNORMAL HIGH (ref 0.44–1.00)
GFR, Estimated: 23 mL/min — ABNORMAL LOW (ref >60)
Glucose, Bld: 105 mg/dL — ABNORMAL HIGH (ref 70–99)
Potassium: 4.2 mmol/L (ref 3.5–5.1)
Sodium: 137 mmol/L (ref 135–145)

## 2021-01-13 LAB — CBC
HCT: 30.1 % — ABNORMAL LOW (ref 36.0–46.0)
Hemoglobin: 9.4 g/dL — ABNORMAL LOW (ref 12.0–15.0)
MCH: 29.8 pg (ref 26.0–34.0)
MCHC: 31.2 g/dL (ref 30.0–36.0)
MCV: 95.6 fL (ref 80.0–100.0)
Platelets: 251 10*3/uL (ref 150–400)
RBC: 3.15 MIL/uL — ABNORMAL LOW (ref 3.87–5.11)
RDW: 13.4 % (ref 11.5–15.5)
WBC: 8 10*3/uL (ref 4.0–10.5)
nRBC: 0 % (ref 0.0–0.2)

## 2021-01-13 LAB — GLUCOSE, CAPILLARY
Glucose-Capillary: 110 mg/dL — ABNORMAL HIGH (ref 70–99)
Glucose-Capillary: 118 mg/dL — ABNORMAL HIGH (ref 70–99)
Glucose-Capillary: 135 mg/dL — ABNORMAL HIGH (ref 70–99)
Glucose-Capillary: 148 mg/dL — ABNORMAL HIGH (ref 70–99)
Glucose-Capillary: 91 mg/dL (ref 70–99)
Glucose-Capillary: 93 mg/dL (ref 70–99)

## 2021-01-13 MED ORDER — HYDRALAZINE HCL 20 MG/ML IJ SOLN
10.0000 mg | INTRAMUSCULAR | Status: DC | PRN
  Administered 2021-01-13 – 2021-01-21 (×7): 10 mg via INTRAVENOUS
  Filled 2021-01-13 (×7): qty 1

## 2021-01-13 NOTE — Progress Notes (Signed)
Requested a foley catheter from MD d/t frequent urine bed changes which are causing extreme pain for patient. Pt is A&O to self, follows commands, easily reoriented, and calm and cooperative. Pt states that she is having some pain. Bed alarm on. Call bell within reach. Will continue to monitor.

## 2021-01-13 NOTE — Progress Notes (Signed)
Patient Demographics:    [name], is a 70 y.o. female, DOB - 03-05-1951, TFT:[id number]  Admit date - 01/08/2021   Admitting Physician Bernadette Hoit, DO  Outpatient Primary MD for the patient is [name], MD  LOS - 5   Chief Complaint  Patient presents with  . Fall        Subjective:    [name] the patient was seen and examined this morning, comfortable laying in bed, alert oriented to self, following command instructions. According to nursing staff with repositioning patient had pain overnight.  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 female with who is a reformed smoker with medical history significant for HTN, HLD, T2DM, dementia and obesity, COPD admitted on 01/08/2021 after falling at home with right distal humerus/elbow and right distal femur/knee fracture, not amenable to surgical fixation, requiring prolonged rehab ----------------------------------------------------------------------------------------------------------------------------------  A/p 1)Rt distal femur/knee fracture- -mobility remains limited due to right arm and right leg fracture, pain with repositioning  Continue as needed  analgesics for optimal pain control   ---discussed with orthopedic surgeon Dr. Aline Brochure, who also discussed with orthopedic surgeon on-call at [facility]--- after reviewing imaging studies and further orthopedic consultation- -They advised rehab on conservative medical management -Apparently fracture location is Not amenable to operative fixation -Patient will have to be nonweightbearing for up to 90 days -Will need rehab and pain control -PT/OT eval --commended SNF, insurance has denied the patient. I have reached out to [facility] for peer to peer review  Dr. Floreen Comber [phone] option 3. Her policy # is [id number]. They believe the patient is total custodial and they do not cover long-term care   2)Right distal humerus/elbow fracture- -immobile right upper extremity, -- as per orthopedic surgeon not amenable to operative fixation -Casting and conservative treatment with rehab advised -Not weightbearing on right upper extremity for 90 days -PT/OT eval-pending SNF  3)HTN-- -eEevated blood pressure with pain, will utilize as needed hydralazine -continue to monitor continue current care   -BP improving after medication adjustments and with improved pain control -Continue atenolol - Stopped chlorthalidone due to persistent electrolyte abnormalities -Stopped oral hydralazine -Stop verapamil due to concerns for bradycardia with concomitant use of atenolol -Continue amlodipine 10 mg daily - Avoiding  ACEI/ARB/ARNI due to renal concerns   4)DM2-recent A1c prior A1c was 8.7 reflecting uncontrolled diabetes with hyperglycemia PTA -At home patient was supposed to be taking [place] but had not been compliant lately -We will continue CBGs q. 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   -  Creatinine 2.86 on admission >>> 2.45>> 2.28 today -Prior baseline was around 1.4 renally adjust medications, avoid nephrotoxic agents /  dehydration  / hypotension -Stopped chlorthalidone due to persistent electrolyte derangement  6)Hypokalemia/hyponatremia/hypomagnesemia--stopped chlorthalidone,  -Replaced lytes   7)Dementia with some cognitive and memory deficits--currently on Aricept -May use lorazepam as needed agitation  8)Recurrent falls---PTA pt lived alone and did very poorly, patient has significant limitations with mobility related ADLs- this patient needs to continue to be monitored in the hospital until a SNF bed is obtained as she is Not safe to go home with her current physcical limitations - right knee and right elbow fractures, patient is nonweightbearing at this time due to acute fractures - status post fall at home, No safe discharge plan at this time* -PT/OT eval recommending SNF rehab  -9) tobacco abuse----smoking cessation advised, may use nicotine patch  10) recurrent urinary retention-Foley catheter in situ--- patient has fractures of the right lower and right upper extremities--nonweightbearing for at least 90 days -Patient having difficulty with purewick/bedbound/turning in bed -Given nonweightbearing status with acute fractures continue Foley catheter for now -Foley placed 01/10/2021, removed 01/12/2021 - will continue to monitor closely for urinary retention  11)Meals--patient is nonweightbearing on the right upper extremity patient needs total assistance with feeding  12)Chronic Anemia--- hemoglobin stable at this time -Patient  has had chronic anemia for long time -H&H remained stable  Difficult placement:  Disposition/Need for in-Hospital Stay- patient unable to be discharged at this time due to ---  Progressive debility due to right knee right elbow fractures,   patient is nonweightbearing, status post fall at home, No safe discharge plan at this time-- awaiting insurance approval for transfer to SNF rehab  I have reached out to [facility] for peer to peer review  Called [phone] option 3.  Her policy # is [id number]. Got a call back from [employer] Dr. Floreen Comber stating she is total care - needs a prolonged care and they will not cover the, per PT OT note needs total care cannot follow instructions.... I have placed a reconsult note for PT/OT     Status is: Inpatient  Remains inpatient appropriate because:Please see disposition above   Disposition: The patient is from: Home              Anticipated d/c is to: SNF              Anticipated d/c date is: Whenever good approved for SNF              Patient currently stable for discharge Barriers: Insurance approval  Code Status : -  Code Status: Full Code   Family Communication:    (patient is alert, awake and coherent)  Discussed with son    Consults  :  ortho  DVT Prophylaxis  :   - SCDs enoxaparin (LOVENOX) injection 30 mg Start: 01/12/21 2100 SCDs Start: 01/08/21 2051    Lab Results  Component Value Date   PLT 251 01/13/2021    Inpatient Medications  Scheduled Meds: . amLODipine  10 mg Oral Daily  . atenolol  50 mg Oral QHS  . Chlorhexidine Gluconate Cloth  6 each Topical Daily  . donepezil  5 mg Oral Daily  . enoxaparin (LOVENOX) injection  30 mg Subcutaneous Q24H  . insulin aspart  0-9 Units Subcutaneous Q4H  . methocarbamol  750 mg Oral TID  . nicotine  21 mg Transdermal Daily  . polyethylene glycol  17 g Oral Daily  .  rosuvastatin  5 mg Oral Daily  . senna-docusate  2 tablet Oral QHS  . sertraline  50 mg Oral Daily   Continuous Infusions:  PRN Meds:.acetaminophen, hydrALAZINE, LORazepam, morphine injection, oxyCODONE  Anti-infectives (From admission, onward)   None        Objective:   Vitals:   01/12/21 2246 01/12/21 2343 01/13/21 0610 01/13/21 0900  BP: (!) 217/78 (!) 217/76 (!) 204/78 (!) 217/78  Pulse: (!) 59  62   Resp: 20  20   Temp: 98.6 F (37 C)  98.5 F (36.9 C)   TempSrc: Oral  Oral   SpO2: 91%  92%   Weight:      Height:        Wt Readings from Last 3 Encounters:   01/08/21 85.3 kg  12/13/20 85.3 kg  12/15/19 95.7 kg     Intake/Output Summary (Last 24 hours) at 01/13/2021 1224 Last data filed at 01/13/2021 0700 Gross per 24 hour  Intake 120 ml  Output 800 ml  Net -680 ml      Physical Exam:   General:  Alert, oriented,  to self, follows command, comfortable in bed  HEENT:  Normocephalic, PERRL, otherwise with in Normal limits   Neuro:  CNII-XII intact. , normal motor and sensation, reflexes intact   Lungs:   Clear to auscultation BL, Respirations unlabored, no wheezes / crackles  Cardio:    S1/S2, RRR, No murmure, No Rubs or Gallops   Abdomen:   Soft, non-tender, bowel sounds active all four quadrants,  no guarding or peritoneal signs.  Muscular skeletal:  right elbow and right knee fractures as above with braces/sling Limited exam - in bed, able to move left upper and lower extremities,2+ pulses,  symmetric, No pitting edema  Skin:  Dry, warm to touch, negative for any Rashes,  Wounds: Please see nursing documentation             Data Review:   Micro Results Recent Results (from the past 240 hour(s))  Resp Panel by RT-PCR (Flu A&B, Covid) Nasopharyngeal Swab     Status: None   Collection Time: 01/08/21  5:32 PM   Specimen: Nasopharyngeal Swab; Nasopharyngeal(NP) swabs in vial transport medium  Result Value Ref Range Status   SARS Coronavirus 2 by RT PCR NEGATIVE NEGATIVE Final    Comment: (NOTE) SARS-CoV-2 target nucleic acids are NOT DETECTED.  The SARS-CoV-2 RNA is generally detectable in upper respiratory specimens during the acute phase of infection. The lowest concentration of SARS-CoV-2 viral copies this assay can detect is 138 copies/mL. A negative result does not preclude SARS-Cov-2 infection and should not be used as the sole basis for treatment or other patient management decisions. A negative result may occur with  improper specimen collection/handling, submission of specimen other than nasopharyngeal swab,  presence of viral mutation(s) within the areas targeted by this assay, and inadequate number of viral copies(<138 copies/mL). A negative result must be combined with clinical observations, patient history, and epidemiological information. The expected result is Negative.  Fact Sheet for Patients:  EntrepreneurPulse.com.au  Fact Sheet for Healthcare Providers:  IncredibleEmployment.be  This test is no t yet approved or cleared by the [place] FDA and  has been authorized for detection and/or diagnosis of SARS-CoV-2 by FDA under an Emergency Use Authorization (EUA). This EUA will remain  in effect (meaning this test can be used) for the duration of the COVID-19 declaration under Section 564(b)(1) of the Act, 21 U.S.C.section 360bbb-3(b)(1), unless the authorization is  terminated  or revoked sooner.       Influenza A by PCR NEGATIVE NEGATIVE Final   Influenza B by PCR NEGATIVE NEGATIVE Final    Comment: (NOTE) The Xpert Xpress SARS-CoV-2/FLU/RSV plus assay is intended as an aid in the diagnosis of influenza from Nasopharyngeal swab specimens and should not be used as a sole basis for treatment. Nasal washings and aspirates are unacceptable for Xpert Xpress SARS-CoV-2/FLU/RSV testing.  Fact Sheet for Patients: EntrepreneurPulse.com.au  Fact Sheet for Healthcare Providers: IncredibleEmployment.be  This test is not yet approved or cleared by the [place] FDA and has been authorized for detection and/or diagnosis of SARS-CoV-2 by FDA under an Emergency Use Authorization (EUA). This EUA will remain in effect (meaning this test can be used) for the duration of the COVID-19 declaration under Section 564(b)(1) of the Act, 21 U.S.C. section 360bbb-3(b)(1), unless the authorization is terminated or revoked.  Performed at [facility], [address], [place], [place] [address]     Radiology Reports DG  Chest 2 View  Result Date: 01/08/2021 CLINICAL DATA:  Fall today.  Right elbow fracture. EXAM: CHEST - 2 VIEW COMPARISON:  12/31/2017 FINDINGS: Stable mild cardiomegaly. Aortic atherosclerotic calcification noted. Both lungs are clear. IMPRESSION: Stable mild cardiomegaly. No active lung disease. Electronically Signed   By: Marlaine Hind M.D.   On: 01/08/2021 17:04   DG Pelvis 1-2 Views  Result Date: 01/08/2021 CLINICAL DATA:  Pain after fall. EXAM: PELVIS - 1-2 VIEW COMPARISON:  None. FINDINGS: The patient is status post left hip replacement. Visualized hardware is in good position. No acute fractures are identified. Vascular calcifications are noted. IMPRESSION: No acute abnormalities. Electronically Signed   By: Dorise Bullion III M.D   On: 01/08/2021 17:01   DG Shoulder Right  Result Date: 01/08/2021 CLINICAL DATA:  Fall outside today.  Right upper extremity pain. EXAM: RIGHT SHOULDER - 2+ VIEW COMPARISON:  None. FINDINGS: There is no evidence of fracture or dislocation. Mild acromioclavicular and glenohumeral degenerative change. Subcortical cystic change in the lateral humeral head suggests underlying rotator cuff arthropathy. Small subacromial spur. Calcifications projecting over the inferior glenoid. IMPRESSION: 1. No acute fracture or subluxation of the right shoulder. 2. Mild acromioclavicular and glenohumeral degenerative change. Small subacromial spur. 3. Subcortical cystic change in the lateral humeral head suggests underlying rotator cuff arthropathy. Electronically Signed   By: Keith Rake M.D.   On: 01/08/2021 16:55   DG Elbow Complete Right  Result Date: 01/08/2021 CLINICAL DATA:  Pain after fall. EXAM: RIGHT ELBOW - COMPLETE 3+ VIEW COMPARISON:  None. FINDINGS: There is a supracondylar fracture through the distal humerus. A joint effusion is identified. No other abnormalities. IMPRESSION: Supracondylar distal humeral fracture with a joint effusion. Electronically Signed   By:  Dorise Bullion III M.D   On: 01/08/2021 16:54   DG Wrist Complete Right  Result Date: 01/08/2021 CLINICAL DATA:  Pain after fall EXAM: RIGHT WRIST - COMPLETE 3+ VIEW COMPARISON:  None. FINDINGS: There is no evidence of fracture or dislocation. There is no evidence of arthropathy or other focal bone abnormality. Soft tissues are unremarkable. IMPRESSION: Negative. Electronically Signed   By: Dorise Bullion III M.D   On: 01/08/2021 16:55   CT Head Wo Contrast  Result Date: 01/08/2021 CLINICAL DATA:  Fall outside today. EXAM: CT HEAD WITHOUT CONTRAST TECHNIQUE: Contiguous axial images were obtained from the base of the skull through the vertex without intravenous contrast. COMPARISON:  Head CT 08/28/2019 FINDINGS: Brain: No intracranial hemorrhage,  mass effect, or midline shift. No hydrocephalus. The basilar cisterns are patent. Stable degree of chronic small vessel ischemia from prior. No evidence of territorial infarct or acute ischemia. No extra-axial or intracranial fluid collection. Vascular: Atherosclerosis of skullbase vasculature without hyperdense vessel or abnormal calcification. Skull: No fracture or focal lesion. Sinuses/Orbits: Chronic opacification of bilateral mastoid air cells. No acute findings. Bilateral cataract resection. Other: None. IMPRESSION: 1. No acute intracranial abnormality. No skull fracture. 2. Stable degree of chronic small vessel ischemia. 3. Chronic opacification of bilateral mastoid air cells. Electronically Signed   By: Keith Rake M.D.   On: 01/08/2021 17:05   CT Cervical Spine Wo Contrast  Result Date: 01/08/2021 CLINICAL DATA:  Fall outside today. EXAM: CT CERVICAL SPINE WITHOUT CONTRAST TECHNIQUE: Multidetector CT imaging of the cervical spine was performed without intravenous contrast. Multiplanar CT image reconstructions were also generated. COMPARISON:  None. FINDINGS: Alignment: Straightening of normal lordosis. Trace degenerative anterolisthesis of C3 on  C4 and retrolisthesis of C5 on C6. No traumatic subluxation. Skull base and vertebrae: No acute fracture. Vertebral body heights are maintained. The dens and skull base are intact. Soft tissues and spinal canal: No prevertebral fluid or swelling. No visible canal hematoma. Disc levels: Degenerative disc disease C4-C5, C5-C6, and C6-C7 with disc space narrowing and endplate spurring. There is multilevel facet hypertrophy. Facet ankylosis at C2-C3 on the left. Upper chest: Mild emphysema.  No acute findings. Other: Carotid calcifications. IMPRESSION: Multilevel degenerative change in the cervical spine without acute fracture or subluxation. Electronically Signed   By: Keith Rake M.D.   On: 01/08/2021 17:08   DG Knee Complete 4 Views Left  Result Date: 01/08/2021 CLINICAL DATA:  Pain after fall EXAM: LEFT KNEE - COMPLETE 4+ VIEW COMPARISON:  None. FINDINGS: The patient is status post left knee replacement. Hardware is intact with no evidence of failure. No fractures are identified. No joint effusion. Vascular calcifications noted. IMPRESSION: Left knee replacement without evidence of hardware failure. No fractures or effusions. Electronically Signed   By: Dorise Bullion III M.D   On: 01/08/2021 17:00   DG Knee Complete 4 Views Right  Result Date: 01/08/2021 CLINICAL DATA:  Pain after fall EXAM: RIGHT KNEE - COMPLETE 4+ VIEW COMPARISON:  None FINDINGS: The patient is status post right knee replacement. There is a comminuted fracture involving the lateral and medial aspects of the distal femur. A fat fluid level seen on the lateral view consistent with a hemarthrosis. The proximal fibula and proximal tibia are intact. IMPRESSION: Comminuted fracture of the distal lateral and medial femur. Fracture lines extend to the femoral component of the knee replacement. Hemarthrosis consistent with the distal femoral fracture. Electronically Signed   By: Dorise Bullion III M.D   On: 01/08/2021 16:59      CBC Recent Labs  Lab 01/08/21 1834 01/09/21 8756 01/10/21 0615 01/12/21 0635 01/13/21 0831  WBC 9.0 10.7* 11.4* 7.7 8.0  HGB 10.8* 10.3* 10.8* 9.2* 9.4*  HCT 32.2* 32.0* 33.4* 28.8* 30.1*  PLT 269 275 270 208 251  MCV 92.3 93.6 95.2 96.3 95.6  MCH 30.9 30.1 30.8 30.8 29.8  MCHC 33.5 32.2 32.3 31.9 31.2  RDW 13.0 13.2 13.3 13.3 13.4  LYMPHSABS 1.2  --   --   --   --   MONOABS 0.7  --   --   --   --   EOSABS 0.1  --   --   --   --   BASOSABS 0.0  --   --   --   --  Chemistries  Recent Labs  Lab 01/08/21 1834 01/09/21 0656 01/10/21 0615 01/11/21 0903 01/13/21 0831  NA 134* 136 137 137 137  K 2.3* 2.6* 3.9 3.8 4.2  CL 101 103 106 109 112*  CO2 23 21* 20* 19* 18*  GLUCOSE 121* 136* 129* 117* 105*  BUN 34* 32* 34* 35* 35*  CREATININE 2.86* 2.42* 2.56* 2.45* 2.28*  CALCIUM 8.1* 8.2* 9.0 9.0 8.6*  MG 1.3* 1.5*  --   --   --   AST  --  13*  --   --   --   ALT  --  12  --   --   --   ALKPHOS  --  69  --   --   --   BILITOT  --  1.7*  --   --   --    ------------------------------------------------------------------------------------------------------------------ No results for input(s): CHOL, HDL, LDLCALC, TRIG, CHOLHDL, LDLDIRECT in the last 72 hours.  Lab Results  Component Value Date   HGBA1C 6.3 (H) 01/08/2021   ------------------------------------------------------------------------------------------------------------------ No results for input(s): TSH, T4TOTAL, T3FREE, THYROIDAB in the last 72 hours.  Invalid input(s): FREET3 ------------------------------------------------------------------------------------------------------------------ No results for input(s): VITAMINB12, FOLATE, FERRITIN, TIBC, IRON, RETICCTPCT in the last 72 hours.  Coagulation profile Recent Labs  Lab 01/09/21 0656  INR 1.0    No results for input(s): DDIMER in the last 72 hours.  Cardiac Enzymes No results for input(s): CKMB, TROPONINI, MYOGLOBIN in the last 168  hours.  Invalid input(s): CK ------------------------------------------------------------------------------------------------------------------ No results found for: BNP   Deatra James M.D on 01/13/2021 at 12:24 PM  Go to [email or web address] - for contact info  [facility] - Office  [phone]

## 2021-01-13 NOTE — Progress Notes (Signed)
Assumed pt care at 0700. Alert to self. VSS. Adequate pain control at this time. Right arm in sling. Right knee immobilizer in place. Per night nurse, pt is in a lot of pain with any attempt to reposition. Tolerating diet. Bed alarm on. Call bell within reach. Will continue to monitor.

## 2021-01-13 NOTE — Progress Notes (Signed)
Hydralazine 10mg  IV given for hypertension as order per MD. Pt resting comfortably in bed in no acute distress. Pt ate a little bit of dinner. Pt has poor appetite. Bed alarm on. Call bell within reach. Will continue to monitor.

## 2021-01-13 NOTE — Progress Notes (Signed)
Physical Therapy Treatment Patient Details Name: [name] MRN: [id number] DOB: 16-Apr-1951 Today's Date: 01/13/2021    History of Present Illness HPI: [name] is a 70 y.o. female with medical history significant for hypertension, hyperlipidemia, T2DM, dementia and obesity who presents to the emergency department after sustaining a fall at home prior to arrival.  History was obtained from ED physician and son at bedside.  Per son, patient lives alone, but son checks in on her regularly, he took away her car keys due to dementia, a neighbor saw her fall while trying to get into locked car and landed on her right side.  She complained of right arm, right knee and right hip pain.  She was unable to get up after the fall, EMS was activated and patient was sent to the ED for further evaluation and management.    PT Comments    Patient presents alert and cooperative and able to follow most directions with frequent repeated verbal/tacile cueing - distracted mostly due to c/o severe RUE pain.  Patient demonstrates improvement for using LUE and abdominals to help her self sit up at bedside, once seated demonstrates fair/good sitting balance without supporting self with LUE, tolerated sitting up for approximately 20 minutes while completing BLE ROM/stregnthening exercises and required active assistance with most exercises due to increased pain.  Patient unable to stand due generalized weakness and RUE/RLE pain.  Patient required Max assist to reposition when put back to bed.  Patient will benefit from continued physical therapy in hospital and recommended venue below to increase strength, balance, endurance for safe ADLs and gait.   Follow Up Recommendations  SNF     Equipment Recommendations  None recommended by PT;Other (comment)    Recommendations for Other Services       Precautions / Restrictions Precautions Precautions: Fall Type of Shoulder Precautions: NWB R LE and R  UE Required Braces or Orthoses: Sling Restrictions Weight Bearing Restrictions: Yes RUE Weight Bearing: Non weight bearing RLE Weight Bearing: Non weight bearing    Mobility  Bed Mobility Overal bed mobility: Needs Assistance Bed Mobility: Supine to Sit;Sit to Supine     Supine to sit: Mod assist;Max assist Sit to supine: Max assist   General bed mobility comments: patient able to use LUE help her self sit up at bedside    Transfers                    Ambulation/Gait                 Stairs             Wheelchair Mobility    Modified Rankin (Stroke Patients Only)       Balance Overall balance assessment: Needs assistance Sitting-balance support: Feet supported;No upper extremity supported Sitting balance-Leahy Scale: Fair Sitting balance - Comments: fair/good seated EOB                                    Cognition Arousal/Alertness: Awake/alert Behavior During Therapy: WFL for tasks assessed/performed Overall Cognitive Status: History of cognitive impairments - at baseline                                        Exercises General Exercises - Lower Extremity Ankle Circles/Pumps: Seated;AROM;Strengthening;10 reps;Both Long Arc Quad:  Seated;AAROM;Strengthening;Both;10 reps Hip Flexion/Marching: Seated;AAROM;Strengthening;Both;10 reps    General Comments        Pertinent Vitals/Pain Pain Assessment: Faces Faces Pain Scale: Hurts whole lot Pain Location: RUE, RLE Pain Descriptors / Indicators: Grimacing;Guarding;Sore Pain Intervention(s): Limited activity within patient's tolerance;Monitored during session;Premedicated before session;Repositioned    Home Living                      Prior Function            PT Goals (current goals can now be found in the care plan section) Acute Rehab PT Goals Patient Stated Goal: return home PT Goal Formulation: With patient Time For Goal Achievement:  01/25/21 Potential to Achieve Goals: Fair Progress towards PT goals: Progressing toward goals    Frequency    Min 3X/week      PT Plan Current plan remains appropriate    Co-evaluation              AM-PAC PT "6 Clicks" Mobility   Outcome Measure  Help needed turning from your back to your side while in a flat bed without using bedrails?: A Lot Help needed moving from lying on your back to sitting on the side of a flat bed without using bedrails?: A Lot Help needed moving to and from a bed to a chair (including a wheelchair)?: Total Help needed standing up from a chair using your arms (e.g., wheelchair or bedside chair)?: Total Help needed to walk in hospital room?: Total Help needed climbing 3-5 steps with a railing? : Total 6 Click Score: 8    End of Session   Activity Tolerance: Patient tolerated treatment well;Patient limited by fatigue;Patient limited by pain Patient left: in bed;with call bell/phone within reach;with bed alarm set Nurse Communication: Mobility status PT Visit Diagnosis: Unsteadiness on feet (R26.81);Other abnormalities of gait and mobility (R26.89);Muscle weakness (generalized) (M62.81)     Time: 4268-3419 PT Time Calculation (min) (ACUTE ONLY): 30 min  Charges:  $Therapeutic Exercise: 8-22 mins $Therapeutic Activity: 8-22 mins                     3:29 PM, 01/13/21 Lonell Grandchild, MPT Physical Therapist with [facility] [phone] office [phone] mobile phone

## 2021-01-13 NOTE — TOC Progression Note (Addendum)
Transition of Care [facility]) - Progression Note    Patient Details  Name: [name] MRN: [id number] Date of Birth: [birth year]  [facility]) CM/SW Contact  Boneta Lucks, RN Phone Number: 01/13/2021, 1:24 PM  Clinical Narrative:   Peer to peer completed, [facility] is still denying SNF. Per MD patient will follow commands. PT will assess again. Family is appealing. TOC discuss options for private pay for SNF and home with [facility] and private sitter with her son. He will appeal. TOC to follow.  Addendum: TOC called [place] - daughter in law, to confirm they will call today to ask for an expedited appeal. She confirmed they will call today.   Expected Discharge Plan: Skilled Nursing Facility Barriers to Discharge: Insurance Authorization  Expected Discharge Plan and Services Expected Discharge Plan: [place]      Living arrangements for the past 2 months: Single Family Home                  Readmission Risk Interventions Readmission Risk Prevention Plan 01/11/2021  Medication Screening Complete  Transportation Screening Complete  Some recent data might be hidden

## 2021-01-14 DIAGNOSIS — F0390 Unspecified dementia without behavioral disturbance: Secondary | ICD-10-CM | POA: Diagnosis not present

## 2021-01-14 DIAGNOSIS — N179 Acute kidney failure, unspecified: Secondary | ICD-10-CM | POA: Diagnosis not present

## 2021-01-14 DIAGNOSIS — S7291XA Unspecified fracture of right femur, initial encounter for closed fracture: Secondary | ICD-10-CM | POA: Diagnosis not present

## 2021-01-14 DIAGNOSIS — D649 Anemia, unspecified: Secondary | ICD-10-CM | POA: Diagnosis not present

## 2021-01-14 LAB — CBC
HCT: 35 % — ABNORMAL LOW (ref 36.0–46.0)
Hemoglobin: 11.1 g/dL — ABNORMAL LOW (ref 12.0–15.0)
MCH: 30.1 pg (ref 26.0–34.0)
MCHC: 31.7 g/dL (ref 30.0–36.0)
MCV: 94.9 fL (ref 80.0–100.0)
Platelets: 292 10*3/uL (ref 150–400)
RBC: 3.69 MIL/uL — ABNORMAL LOW (ref 3.87–5.11)
RDW: 13.2 % (ref 11.5–15.5)
WBC: 9.2 10*3/uL (ref 4.0–10.5)
nRBC: 0 % (ref 0.0–0.2)

## 2021-01-14 LAB — BASIC METABOLIC PANEL
Anion gap: 8 (ref 5–15)
BUN: 38 mg/dL — ABNORMAL HIGH (ref 8–23)
CO2: 20 mmol/L — ABNORMAL LOW (ref 22–32)
Calcium: 9.3 mg/dL (ref 8.9–10.3)
Chloride: 111 mmol/L (ref 98–111)
Creatinine, Ser: 2.19 mg/dL — ABNORMAL HIGH (ref 0.44–1.00)
GFR, Estimated: 24 mL/min — ABNORMAL LOW (ref >60)
Glucose, Bld: 98 mg/dL (ref 70–99)
Potassium: 4.3 mmol/L (ref 3.5–5.1)
Sodium: 139 mmol/L (ref 135–145)

## 2021-01-14 LAB — GLUCOSE, CAPILLARY
Glucose-Capillary: 81 mg/dL (ref 70–99)
Glucose-Capillary: 105 mg/dL — ABNORMAL HIGH (ref 70–99)
Glucose-Capillary: 118 mg/dL — ABNORMAL HIGH (ref 70–99)
Glucose-Capillary: 114 mg/dL — ABNORMAL HIGH (ref 70–99)
Glucose-Capillary: 128 mg/dL — ABNORMAL HIGH (ref 70–99)

## 2021-01-14 MED ORDER — HYDRALAZINE HCL 25 MG PO TABS: 25.0000 mg | ORAL_TABLET | Freq: Three times a day (TID) | ORAL | Status: DC

## 2021-01-14 MED ORDER — ATENOLOL 25 MG PO TABS
100.0000 mg | ORAL_TABLET | Freq: Two times a day (BID) | ORAL | Status: DC
  Administered 2021-01-14: 100 mg via ORAL
  Filled 2021-01-14: qty 4

## 2021-01-14 MED ORDER — ALPRAZOLAM 0.5 MG PO TABS
0.5000 mg | ORAL_TABLET | Freq: Two times a day (BID) | ORAL | Status: DC
  Administered 2021-01-14 – 2021-01-15 (×3): 0.5 mg via ORAL
  Filled 2021-01-14 (×5): qty 1

## 2021-01-14 MED ORDER — LABETALOL HCL 200 MG PO TABS
200.0000 mg | ORAL_TABLET | Freq: Two times a day (BID) | ORAL | Status: DC
  Administered 2021-01-14 – 2021-01-15 (×4): 200 mg via ORAL
  Filled 2021-01-14 (×4): qty 1

## 2021-01-14 MED ORDER — HYDRALAZINE HCL 25 MG PO TABS
50.0000 mg | ORAL_TABLET | Freq: Three times a day (TID) | ORAL | Status: DC
  Administered 2021-01-14 – 2021-01-15 (×6): 50 mg via ORAL
  Filled 2021-01-14 (×7): qty 2

## 2021-01-14 NOTE — Care Management Important Message (Signed)
Important Message  Patient Details  Name: [name] MRN: [id number] Date of Birth: Jul 19, 1951   Medicare Important Message Given:  Yes     [name] 01/14/2021, 12:39 PM

## 2021-01-14 NOTE — TOC Progression Note (Signed)
Transition of Care [facility]) - Progression Note    Patient Details  Name: [name] MRN: [id number] Date of Birth: 12-Jan-1951  Transition of Care [facility]) CM/SW Contact  Hannah Courts, RN Phone Number: 01/14/2021, 11:48 AM  Clinical Narrative:    Received notification that family attempted to file an expedited appeal with [employer] and were informed that process would take 30 days.  Called [employer] expedited appeal line [phone]) and confirmed that [facility] has 72 hours to respond to appeal once filed.  Requested to initiate expedited appeal, ref # [id number].  [employer] requests pertinent clinicals to be faxed to [phone], patient's CM [name] made aware of this request.     Expected Discharge Plan: Skilled Nursing Facility Barriers to Discharge: Insurance Authorization  Expected Discharge Plan and Services Expected Discharge Plan: [place]       Living arrangements for the past 2 months: Single Family Home                                       Social Determinants of Health (SDOH) Interventions    Readmission Risk Interventions Readmission Risk Prevention Plan 01/11/2021  Medication Screening Complete  Transportation Screening Complete  Some recent data might be hidden

## 2021-01-14 NOTE — Progress Notes (Signed)
Patient Demographics:    [name], is a 70 y.o. female, DOB - 1951-01-21, IEP:[id number]  Admit date - 01/08/2021   Admitting Physician Bernadette Hoit, DO  Outpatient Primary MD for the patient is [name], MD  LOS - 6   Chief Complaint  Patient presents with  . Fall        Subjective:    [name] was seen and examined this morning, laying in bed, grimacing from pain, as she is trying to reposition her arm.... Nursing staff present at bedside about to give her pain medication and her morning meds.... Reporting that her blood pressure might be high as she is intense, may have some anxiety along with pain.   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 female with who is a reformed smoker with medical history significant for HTN, HLD, T2DM, dementia and obesity, COPD admitted on 01/08/2021 after falling at home with right distal humerus/elbow and right distal femur/knee fracture, not amenable to surgical fixation, requiring prolonged rehab ----------------------------------------------------------------------------------------------------------------------------------  Assessment and plan :   1)Rt distal femur/knee fracture- -mobility remains limited due  to right arm and right leg fracture, pain with repositioning  Continue as needed analgesics for optimal pain control -We will titrate her analgesics up for better pain control   -discussed with orthopedic surgeon Dr. Aline Brochure, who also discussed with orthopedic surgeon on-call at [facility]--- after reviewing imaging studies and further orthopedic consultation- -They advised rehab on conservative medical management -Apparently fracture location is Not amenable to operative fixation -Patient will have to be nonweightbearing for up to 90 days -Will need rehab and pain control -PT/OT eval -recommended SNF, insurance has denied the patient. I have reached out to [facility] for peer to peer review  Dr. Floreen Comber [phone] option 3. Her policy # is [id number]. They believe the patient is total custodial and they do not cover long-term care  -PT continue to work with patient, she follows command, and instructions  2)Right distal humerus/elbow fracture- -immobile right upper extremity, -- as per orthopedic surgeon not amenable to operative fixation -Casting and conservative treatment with rehab advised -Not weightbearing on right upper extremity for 90 days -PT/OT eval-pending SNF  3)HTN-- -BP remained elevated, we will switch her atenolol to labetalol, and p.o. hydralazine --Continue amlodipine 10 mg daily (Initiating anxiolytics, Xanax and titrating pain medication for better pain control which may help with her blood pressure)  -BP improving after medication adjustments and with improved pain control  Stopped chlorthalidone due to persistent electrolyte abnormalities -Stop verapamil due to concerns for bradycardia with concomitant use of atenolol - Avoiding  ACEI/ARB/ARNI due to  renal concerns   4)DM2-recent A1c prior A1c was 8.7 reflecting uncontrolled diabetes with hyperglycemia PTA -At home patient was supposed to be taking [place] but had not been compliant lately -We will  continue CBGs q. 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   -Creatinine 2.86 on admission >>> 2.45>> 2.28 >>  2.19 today -Prior baseline was around 1.4 -Avoiding nephrotoxins, dehydration, hypotension -Stopped chlorthalidone due to persistent electrolyte derangement  6)Hypokalemia/hyponatremia/hypomagnesemia- -stopped chlorthalidone,  -Replaced lytes   7)Dementia with some cognitive and memory deficits- -currently on Aricept -May use lorazepam as needed agitation   8)Recurrent falls---PTA pt lived alone and did very poorly, patient has significant limitations with mobility related ADLs- this patient needs to continue to be monitored in the hospital until a SNF bed is obtained as she is Not safe to go home with her current physcical limitations - right knee and right elbow fractures, patient is nonweightbearing at this time due to acute fractures - status post fall at home, No safe discharge plan at this time* -PT/OT eval recommending SNF rehab  -9) tobacco abuse----smoking cessation advised, may use nicotine patch  10) recurrent urinary retention-Foley catheter in situ--- patient has fractures of the right lower and right upper extremities--nonweightbearing for at least 90 days -Patient having difficulty with purewick/bedbound/turning in bed -Given nonweightbearing status with acute fractures continue Foley catheter for now -Foley placed 01/10/2021, removed 01/12/2021 - will continue to monitor closely for urinary retention  11)Meals--patient is nonweightbearing on the right upper extremity patient needs total assistance with feeding  12)Chronic Anemia--- hemoglobin stable at this time -Patient  has had chronic anemia for long time -H&H remained stable  Difficult placement:  Disposition/Need for in-Hospital Stay- patient unable to be discharged at this time due to ---  Progressive debility due to right knee right elbow fractures,    patient is nonweightbearing, status post fall at home, No safe discharge plan at this time-- awaiting insurance approval for transfer to SNF rehab  I have reached out to [facility] for peer to peer review  Called [phone] option 3. Her policy # is [id number]. Got a call back from [employer] Dr. Floreen Comber stating she is total care - needs a prolonged care and they will not cover the, per PT OT note needs total care cannot follow instructions.... I have placed a reconsult note for PT/OT     Status is: Inpatient  Remains inpatient appropriate because:Please see disposition above   Disposition: The patient is from: Home              Anticipated d/c is to: SNF              Anticipated d/c date is: Whenever approved for SNF              Patient currently stable for discharge Barriers: Insurance approval  Code Status : -  Code Status: Full Code   Family Communication:    (patient is alert, awake and coherent)  Discussed with son    Consults  :  ortho  DVT Prophylaxis  :   - SCDs enoxaparin (LOVENOX) injection 30 mg Start: 01/12/21 2100 SCDs Start: 01/08/21 2051    Lab Results  Component Value Date   PLT 292 01/14/2021    Inpatient Medications  Scheduled Meds: . ALPRAZolam  0.5 mg Oral BID  . amLODipine  10 mg Oral Daily  . Chlorhexidine Gluconate Cloth  6 each Topical Daily  .  donepezil  5 mg Oral Daily  . enoxaparin (LOVENOX) injection  30 mg Subcutaneous Q24H  . hydrALAZINE  50 mg Oral Q8H  . insulin aspart  0-9 Units Subcutaneous Q4H  . labetalol  200 mg Oral BID  . methocarbamol  750 mg Oral TID  . nicotine  21 mg Transdermal Daily  . polyethylene glycol  17 g Oral Daily  . rosuvastatin  5 mg Oral Daily  . senna-docusate  2 tablet Oral QHS  . sertraline  50 mg Oral Daily   Continuous Infusions:  PRN Meds:.acetaminophen, hydrALAZINE, LORazepam, morphine injection, oxyCODONE  Anti-infectives (From admission, onward)   None        Objective:   Vitals:    01/13/21 1807 01/13/21 2130 01/14/21 0454 01/14/21 1437  BP: (!) 206/72 (!) 196/87 (!) 213/79 (!) 198/78  Pulse: (!) 58 61 (!) 59 62  Resp: 18 18 20 18   Temp: 98 F (36.7 C) 98.5 F (36.9 C) 97.8 F (36.6 C) 97.6 F (36.4 C)  TempSrc: Oral   Oral  SpO2: 96% 91% 100% 98%  Weight:      Height:        Wt Readings from Last 3 Encounters:  01/08/21 85.3 kg  12/13/20 85.3 kg  12/15/19 95.7 kg     Intake/Output Summary (Last 24 hours) at 01/14/2021 1440 Last data filed at 01/14/2021 1300 Gross per 24 hour  Intake 240 ml  Output 1900 ml  Net -1660 ml        Physical Exam:   General:  Alert, oriented, cooperative, in moderate distress grimacing from pain Uncomfortable in her position;   HEENT:  Normocephalic, PERRL, otherwise with in Normal limits   Neuro:  CNII-XII intact. , normal motor and sensation, reflexes intact   Lungs:   Clear to auscultation BL, Respirations unlabored, no wheezes / crackles  Cardio:    S1/S2, RRR, No murmure, No Rubs or Gallops   Abdomen:   Soft, non-tender, bowel sounds active all four quadrants,  no guarding or peritoneal signs.  Muscular skeletal:  right elbow and right knee fractures as above with braces/sling Limited exam - in bed, able to move all 4 extremities, Normal strength,  2+ pulses,  symmetric, No pitting edema  Skin:  Dry, warm to touch, negative for any Rashes,  Wounds: Please see nursing documentation                Data Review:   Micro Results Recent Results (from the past 240 hour(s))  Resp Panel by RT-PCR (Flu A&B, Covid) Nasopharyngeal Swab     Status: None   Collection Time: 01/08/21  5:32 PM   Specimen: Nasopharyngeal Swab; Nasopharyngeal(NP) swabs in vial transport medium  Result Value Ref Range Status   SARS Coronavirus 2 by RT PCR NEGATIVE NEGATIVE Final    Comment: (NOTE) SARS-CoV-2 target nucleic acids are NOT DETECTED.  The SARS-CoV-2 RNA is generally detectable in upper respiratory specimens during the  acute phase of infection. The lowest concentration of SARS-CoV-2 viral copies this assay can detect is 138 copies/mL. A negative result does not preclude SARS-Cov-2 infection and should not be used as the sole basis for treatment or other patient management decisions. A negative result may occur with  improper specimen collection/handling, submission of specimen other than nasopharyngeal swab, presence of viral mutation(s) within the areas targeted by this assay, and inadequate number of viral copies(<138 copies/mL). A negative result must be combined with clinical observations, patient history, and epidemiological information. The  expected result is Negative.  Fact Sheet for Patients:  EntrepreneurPulse.com.au  Fact Sheet for Healthcare Providers:  IncredibleEmployment.be  This test is no t yet approved or cleared by the [place] FDA and  has been authorized for detection and/or diagnosis of SARS-CoV-2 by FDA under an Emergency Use Authorization (EUA). This EUA will remain  in effect (meaning this test can be used) for the duration of the COVID-19 declaration under Section 564(b)(1) of the Act, 21 U.S.C.section 360bbb-3(b)(1), unless the authorization is terminated  or revoked sooner.       Influenza A by PCR NEGATIVE NEGATIVE Final   Influenza B by PCR NEGATIVE NEGATIVE Final    Comment: (NOTE) The Xpert Xpress SARS-CoV-2/FLU/RSV plus assay is intended as an aid in the diagnosis of influenza from Nasopharyngeal swab specimens and should not be used as a sole basis for treatment. Nasal washings and aspirates are unacceptable for Xpert Xpress SARS-CoV-2/FLU/RSV testing.  Fact Sheet for Patients: EntrepreneurPulse.com.au  Fact Sheet for Healthcare Providers: IncredibleEmployment.be  This test is not yet approved or cleared by the [place] FDA and has been authorized for detection and/or  diagnosis of SARS-CoV-2 by FDA under an Emergency Use Authorization (EUA). This EUA will remain in effect (meaning this test can be used) for the duration of the COVID-19 declaration under Section 564(b)(1) of the Act, 21 U.S.C. section 360bbb-3(b)(1), unless the authorization is terminated or revoked.  Performed at [facility], [address] [place], [place] [address]     Radiology Reports DG Chest 2 View  Result Date: 01/08/2021 CLINICAL DATA:  Fall today.  Right elbow fracture. EXAM: CHEST - 2 VIEW COMPARISON:  12/31/2017 FINDINGS: Stable mild cardiomegaly. Aortic atherosclerotic calcification noted. Both lungs are clear. IMPRESSION: Stable mild cardiomegaly. No active lung disease. Electronically Signed   By: Marlaine Hind M.D.   On: 01/08/2021 17:04   DG Pelvis 1-2 Views  Result Date: 01/08/2021 CLINICAL DATA:  Pain after fall. EXAM: PELVIS - 1-2 VIEW COMPARISON:  None. FINDINGS: The patient is status post left hip replacement. Visualized hardware is in good position. No acute fractures are identified. Vascular calcifications are noted. IMPRESSION: No acute abnormalities. Electronically Signed   By: Dorise Bullion III M.D   On: 01/08/2021 17:01   DG Shoulder Right  Result Date: 01/08/2021 CLINICAL DATA:  Fall outside today.  Right upper extremity pain. EXAM: RIGHT SHOULDER - 2+ VIEW COMPARISON:  None. FINDINGS: There is no evidence of fracture or dislocation. Mild acromioclavicular and glenohumeral degenerative change. Subcortical cystic change in the lateral humeral head suggests underlying rotator cuff arthropathy. Small subacromial spur. Calcifications projecting over the inferior glenoid. IMPRESSION: 1. No acute fracture or subluxation of the right shoulder. 2. Mild acromioclavicular and glenohumeral degenerative change. Small subacromial spur. 3. Subcortical cystic change in the lateral humeral head suggests underlying rotator cuff arthropathy. Electronically Signed   By: Keith Rake M.D.   On: 01/08/2021 16:55   DG Elbow Complete Right  Result Date: 01/08/2021 CLINICAL DATA:  Pain after fall. EXAM: RIGHT ELBOW - COMPLETE 3+ VIEW COMPARISON:  None. FINDINGS: There is a supracondylar fracture through the distal humerus. A joint effusion is identified. No other abnormalities. IMPRESSION: Supracondylar distal humeral fracture with a joint effusion. Electronically Signed   By: Dorise Bullion III M.D   On: 01/08/2021 16:54   DG Wrist Complete Right  Result Date: 01/08/2021 CLINICAL DATA:  Pain after fall EXAM: RIGHT WRIST - COMPLETE 3+ VIEW COMPARISON:  None. FINDINGS: There is no  evidence of fracture or dislocation. There is no evidence of arthropathy or other focal bone abnormality. Soft tissues are unremarkable. IMPRESSION: Negative. Electronically Signed   By: Dorise Bullion III M.D   On: 01/08/2021 16:55   CT Head Wo Contrast  Result Date: 01/08/2021 CLINICAL DATA:  Fall outside today. EXAM: CT HEAD WITHOUT CONTRAST TECHNIQUE: Contiguous axial images were obtained from the base of the skull through the vertex without intravenous contrast. COMPARISON:  Head CT 08/28/2019 FINDINGS: Brain: No intracranial hemorrhage, mass effect, or midline shift. No hydrocephalus. The basilar cisterns are patent. Stable degree of chronic small vessel ischemia from prior. No evidence of territorial infarct or acute ischemia. No extra-axial or intracranial fluid collection. Vascular: Atherosclerosis of skullbase vasculature without hyperdense vessel or abnormal calcification. Skull: No fracture or focal lesion. Sinuses/Orbits: Chronic opacification of bilateral mastoid air cells. No acute findings. Bilateral cataract resection. Other: None. IMPRESSION: 1. No acute intracranial abnormality. No skull fracture. 2. Stable degree of chronic small vessel ischemia. 3. Chronic opacification of bilateral mastoid air cells. Electronically Signed   By: Keith Rake M.D.   On: 01/08/2021 17:05   CT  Cervical Spine Wo Contrast  Result Date: 01/08/2021 CLINICAL DATA:  Fall outside today. EXAM: CT CERVICAL SPINE WITHOUT CONTRAST TECHNIQUE: Multidetector CT imaging of the cervical spine was performed without intravenous contrast. Multiplanar CT image reconstructions were also generated. COMPARISON:  None. FINDINGS: Alignment: Straightening of normal lordosis. Trace degenerative anterolisthesis of C3 on C4 and retrolisthesis of C5 on C6. No traumatic subluxation. Skull base and vertebrae: No acute fracture. Vertebral body heights are maintained. The dens and skull base are intact. Soft tissues and spinal canal: No prevertebral fluid or swelling. No visible canal hematoma. Disc levels: Degenerative disc disease C4-C5, C5-C6, and C6-C7 with disc space narrowing and endplate spurring. There is multilevel facet hypertrophy. Facet ankylosis at C2-C3 on the left. Upper chest: Mild emphysema.  No acute findings. Other: Carotid calcifications. IMPRESSION: Multilevel degenerative change in the cervical spine without acute fracture or subluxation. Electronically Signed   By: Keith Rake M.D.   On: 01/08/2021 17:08   DG Knee Complete 4 Views Left  Result Date: 01/08/2021 CLINICAL DATA:  Pain after fall EXAM: LEFT KNEE - COMPLETE 4+ VIEW COMPARISON:  None. FINDINGS: The patient is status post left knee replacement. Hardware is intact with no evidence of failure. No fractures are identified. No joint effusion. Vascular calcifications noted. IMPRESSION: Left knee replacement without evidence of hardware failure. No fractures or effusions. Electronically Signed   By: Dorise Bullion III M.D   On: 01/08/2021 17:00   DG Knee Complete 4 Views Right  Result Date: 01/08/2021 CLINICAL DATA:  Pain after fall EXAM: RIGHT KNEE - COMPLETE 4+ VIEW COMPARISON:  None FINDINGS: The patient is status post right knee replacement. There is a comminuted fracture involving the lateral and medial aspects of the distal femur. A fat  fluid level seen on the lateral view consistent with a hemarthrosis. The proximal fibula and proximal tibia are intact. IMPRESSION: Comminuted fracture of the distal lateral and medial femur. Fracture lines extend to the femoral component of the knee replacement. Hemarthrosis consistent with the distal femoral fracture. Electronically Signed   By: Dorise Bullion III M.D   On: 01/08/2021 16:59     CBC Recent Labs  Lab 01/08/21 1834 01/09/21 0960 01/10/21 0615 01/12/21 4540 01/13/21 0831 01/14/21 0519  WBC 9.0 10.7* 11.4* 7.7 8.0 9.2  HGB 10.8* 10.3* 10.8* 9.2* 9.4* 11.1*  HCT  32.2* 32.0* 33.4* 28.8* 30.1* 35.0*  PLT 269 275 270 208 251 292  MCV 92.3 93.6 95.2 96.3 95.6 94.9  MCH 30.9 30.1 30.8 30.8 29.8 30.1  MCHC 33.5 32.2 32.3 31.9 31.2 31.7  RDW 13.0 13.2 13.3 13.3 13.4 13.2  LYMPHSABS 1.2  --   --   --   --   --   MONOABS 0.7  --   --   --   --   --   EOSABS 0.1  --   --   --   --   --   BASOSABS 0.0  --   --   --   --   --     Chemistries  Recent Labs  Lab 01/08/21 1834 01/09/21 0656 01/10/21 0615 01/11/21 0903 01/13/21 0831 01/14/21 0519  NA 134* 136 137 137 137 139  K 2.3* 2.6* 3.9 3.8 4.2 4.3  CL 101 103 106 109 112* 111  CO2 23 21* 20* 19* 18* 20*  GLUCOSE 121* 136* 129* 117* 105* 98  BUN 34* 32* 34* 35* 35* 38*  CREATININE 2.86* 2.42* 2.56* 2.45* 2.28* 2.19*  CALCIUM 8.1* 8.2* 9.0 9.0 8.6* 9.3  MG 1.3* 1.5*  --   --   --   --   AST  --  13*  --   --   --   --   ALT  --  12  --   --   --   --   ALKPHOS  --  69  --   --   --   --   BILITOT  --  1.7*  --   --   --   --    ------------------------------------------------------------------------------------------------------------------ No results for input(s): CHOL, HDL, LDLCALC, TRIG, CHOLHDL, LDLDIRECT in the last 72 hours.  Lab Results  Component Value Date   HGBA1C 6.3 (H) 01/08/2021    ------------------------------------------------------------------------------------------------------------------ No results for input(s): TSH, T4TOTAL, T3FREE, THYROIDAB in the last 72 hours.  Invalid input(s): FREET3 ------------------------------------------------------------------------------------------------------------------ No results for input(s): VITAMINB12, FOLATE, FERRITIN, TIBC, IRON, RETICCTPCT in the last 72 hours.  Coagulation profile Recent Labs  Lab 01/09/21 0656  INR 1.0    No results for input(s): DDIMER in the last 72 hours.  Cardiac Enzymes No results for input(s): CKMB, TROPONINI, MYOGLOBIN in the last 168 hours.  Invalid input(s): CK ------------------------------------------------------------------------------------------------------------------ No results found for: BNP   Deatra James M.D on 01/14/2021 at 2:40 PM  Go to [email or web address] - for contact info  [facility] - Office  [phone]

## 2021-01-14 NOTE — Progress Notes (Signed)
Physical Therapy Treatment Patient Details Name: [name] MRN: [id number] DOB: [birth year] Today's Date: 01/14/2021    History of Present Illness HPI: [name] is a 70 y.o. female with medical history significant for hypertension, hyperlipidemia, T2DM, dementia and obesity who presents to the emergency department after sustaining a fall at home prior to arrival.  History was obtained from ED physician and son at bedside.  Per son, patient lives alone, but son checks in on her regularly, he took away her car keys due to dementia, a neighbor saw her fall while trying to get into locked car and landed on her right side.  She complained of right arm, right knee and right hip pain.  She was unable to get up after the fall, EMS was activated and patient was sent to the ED for further evaluation and management.    PT Comments    Patient demonstrates slow labored movement for sitting up at bedside with c/o severe pain in right knee, right elbow even after receiving pain medication.  Patient tolerated sitting up at bedside for approximately 10 minutes with poor carryover for attempting exercises mainly due to severe pain with apprehension to move.  Patient unable to attempt sliding board transfers due to pain and put back to bed with 2 person Max assist to reposition.  Patient will benefit from continued physical therapy in hospital and recommended venue below to increase strength, balance, endurance for safe ADLs and gait.    Follow Up Recommendations  SNF     Equipment Recommendations  None recommended by PT;Other (comment) (to be determined)    Recommendations for Other Services       Precautions / Restrictions Precautions Precautions: Fall Type of Shoulder Precautions: NWB R LE and R UE Required Braces or Orthoses: Sling Restrictions Weight Bearing Restrictions: Yes RUE Weight Bearing: Non weight bearing RLE Weight Bearing: Non weight bearing    Mobility  Bed  Mobility Overal bed mobility: Needs Assistance Bed Mobility: Supine to Sit;Sit to Supine     Supine to sit: Max assist Sit to supine: Max assist   General bed mobility comments: patient limited due to c/o severe pain right knee and shoulder    Transfers                    Ambulation/Gait                 Stairs             Wheelchair Mobility    Modified Rankin (Stroke Patients Only)       Balance Overall balance assessment: Needs assistance Sitting-balance support: Feet supported;Single extremity supported Sitting balance-Leahy Scale: Fair Sitting balance - Comments: fair/good seated EOB                                    Cognition Arousal/Alertness: Awake/alert Behavior During Therapy: WFL for tasks assessed/performed Overall Cognitive Status: History of cognitive impairments - at baseline                                        Exercises      General Comments        Pertinent Vitals/Pain Pain Assessment: Faces Faces Pain Scale: Hurts worst Pain Location: RUE, RLE Pain Descriptors / Indicators: Grimacing;Guarding;Sore;Crying Pain Intervention(s): Limited activity  within patient's tolerance;Monitored during session;Premedicated before session;Repositioned    Home Living                      Prior Function            PT Goals (current goals can now be found in the care plan section) Acute Rehab PT Goals Patient Stated Goal: return home PT Goal Formulation: With patient Time For Goal Achievement: 01/25/21 Potential to Achieve Goals: Fair Progress towards PT goals: Progressing toward goals    Frequency    Min 3X/week      PT Plan Current plan remains appropriate    Co-evaluation              AM-PAC PT "6 Clicks" Mobility   Outcome Measure  Help needed turning from your back to your side while in a flat bed without using bedrails?: A Lot Help needed moving from lying on  your back to sitting on the side of a flat bed without using bedrails?: A Lot Help needed moving to and from a bed to a chair (including a wheelchair)?: Total Help needed standing up from a chair using your arms (e.g., wheelchair or bedside chair)?: Total Help needed to walk in hospital room?: Total Help needed climbing 3-5 steps with a railing? : Total 6 Click Score: 8    End of Session   Activity Tolerance: Patient tolerated treatment well;Patient limited by pain;Patient limited by fatigue Patient left: in bed;with call bell/phone within reach;with bed alarm set Nurse Communication: Mobility status PT Visit Diagnosis: Unsteadiness on feet (R26.81);Other abnormalities of gait and mobility (R26.89);Muscle weakness (generalized) (M62.81)     Time: 3267-1245 PT Time Calculation (min) (ACUTE ONLY): 27 min  Charges:  $Therapeutic Activity: 23-37 mins                     3:01 PM, 01/14/21 Lonell Grandchild, MPT Physical Therapist with [facility] 336 [phone] office [phone] mobile phone

## 2021-01-15 DIAGNOSIS — S7291XA Unspecified fracture of right femur, initial encounter for closed fracture: Secondary | ICD-10-CM | POA: Diagnosis not present

## 2021-01-15 LAB — BASIC METABOLIC PANEL
Anion gap: 7 (ref 5–15)
BUN: 44 mg/dL — ABNORMAL HIGH (ref 8–23)
CO2: 20 mmol/L — ABNORMAL LOW (ref 22–32)
Calcium: 9 mg/dL (ref 8.9–10.3)
Chloride: 113 mmol/L — ABNORMAL HIGH (ref 98–111)
Creatinine, Ser: 2.44 mg/dL — ABNORMAL HIGH (ref 0.44–1.00)
GFR, Estimated: 21 mL/min — ABNORMAL LOW (ref >60)
Glucose, Bld: 122 mg/dL — ABNORMAL HIGH (ref 70–99)
Potassium: 4.2 mmol/L (ref 3.5–5.1)
Sodium: 140 mmol/L (ref 135–145)

## 2021-01-15 LAB — GLUCOSE, CAPILLARY
Glucose-Capillary: 111 mg/dL — ABNORMAL HIGH (ref 70–99)
Glucose-Capillary: 119 mg/dL — ABNORMAL HIGH (ref 70–99)
Glucose-Capillary: 123 mg/dL — ABNORMAL HIGH (ref 70–99)
Glucose-Capillary: 131 mg/dL — ABNORMAL HIGH (ref 70–99)
Glucose-Capillary: 194 mg/dL — ABNORMAL HIGH (ref 70–99)
Glucose-Capillary: 204 mg/dL — ABNORMAL HIGH (ref 70–99)

## 2021-01-15 MED ORDER — LACTATED RINGERS IV SOLN: INTRAVENOUS | Status: AC

## 2021-01-15 NOTE — TOC Progression Note (Signed)
Transition of Care [facility]) - Progression Note    Patient Details  Name: [name] MRN: [id number] Date of Birth: 1950-12-01  Transition of Care [facility]) CM/SW Contact  Elliot Gurney [place], [place] Phone Number:  [phone] 01/15/2021, 4:07 PM  Clinical Narrative:    Estill Batten expedited appeal line [phone]) and confirmed that Holland Falling has 72 hours to respond to appeal once filed.  Request still pending.  Fax number confirmed  [phone].  [address] LCSW Transition of Care [phone]   Expected Discharge Plan: Skilled Nursing Facility Barriers to Discharge: Insurance Authorization  Expected Discharge Plan and Services Expected Discharge Plan: [facility]       Living arrangements for the past 2 months: Single Family Home                                       Social Determinants of Health (SDOH) Interventions    Readmission Risk Interventions Readmission Risk Prevention Plan 01/11/2021  Medication Screening Complete  Transportation Screening Complete  Some recent data might be hidden

## 2021-01-15 NOTE — Progress Notes (Signed)
PROGRESS NOTE    [name]  XVQ:[id number] DOB: December 30, 1950 DOA: 01/08/2021 PCP: [name], MD   Brief Narrative:   70 y.o.femalewith who is a reformed smoker with medical history significant forHTN, HLD, T2DM, dementia and obesity, COPD admitted on 01/08/2021 after falling at home with right distal humerus/elbow and right distal femur/knee fracture, not amenable to surgical fixation, requiring prolonged rehab.   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   Right Distal Humeral Fracture/Rt Elbow Fracture-Not Amenable to Operative Fixation   AKI (acute kidney injury) ([place])   Hypokalemia   Fall at home, initial encounter   Hyperlipidemia   Type 2 diabetes mellitus without complication ([place])   Obesity (BMI 30.0-34.9)   Dementia without behavioral disturbance (H[facility])   Hypomagnesemia   COPD (chronic obstructive pulmonary disease) (HCC)   1)Rt distal femur/knee fracture- -mobility remains limited due to right arm and right leg fracture, pain with repositioning  Continue as needed analgesics for optimal pain control -discussed with orthopedic surgeon Dr. Aline Brochure, who also discussed with orthopedic surgeon on-call at [facility]--- after reviewing imaging studies and further orthopedic consultation- -They advised rehab on conservative medical management -Apparently fracture location is Not amenable to operative fixation -Patient will have to be nonweightbearing for up to 90 days -Will need rehab and pain control -PT/OT eval -recommended SNF, insurance has denied the patient. I have reached out to [facility] for peer to peer review  Dr. Floreen Comber [phone] option 3. Her policy # is [id number]. They believe the patient is total custodial and they  do not cover long-term care  -PT continue to work with patient, she follows command, and instructions  2)Right distal humerus/elbow fracture- -immobile right upper extremity, -- as per orthopedic surgeon not amenable to operative fixation -Casting and conservative treatment with rehab advised -Not weightbearing on right upper extremity for 90 days -PT/OT eval-pending SNF  3)HTN-- -BP remained elevated, we will switch her atenolol to labetalol, and p.o. hydralazine --Continue amlodipine 10 mg daily (Initiating anxiolytics, Xanax and titrating pain medication for better pain control which may help with her blood pressure)  -BP improving after medication adjustments and with improved pain control  Stopped chlorthalidone due to persistent electrolyte abnormalities -Stop verapamil due to concerns for bradycardia with concomitant use of atenolol - Avoiding  ACEI/ARB/ARNI due to renal concerns   4)DM2-recent A1c prior A1c was 8.7 reflecting uncontrolled diabetes with hyperglycemia PTA -At home patient was supposed to be taking [place] but had not been compliant lately -We will continue CBGs q. 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   -Creatinine 2.86 on admission improved to 2.44 -Prior baseline was around 1.4 -Avoiding nephrotoxins, dehydration, hypotension -Stopped chlorthalidone due to persistent electrolyte derangement  6)Hypokalemia/hyponatremia/hypomagnesemia- -stopped chlorthalidone,  -Replaced lytes   7)Dementia with some cognitive and memory deficits- -currently on Aricept -May use lorazepam as needed agitation   8)Recurrentfalls---PTA pt lived alone and did very poorly,patient has significant limitations with mobility related ADLs-this patient needs to continue to be monitored in the hospital until a SNF bed is obtained as she is Not safe to go home with her current physcical limitations - right knee  and right elbow fractures, patient is nonweightbearing at this time due to acute fractures -  status post fall at home, No safe discharge plan at this time* -PT/OT eval recommending SNF rehab  -9) tobacco abuse----smoking cessation advised, may use nicotine patch  10) recurrent urinary retention-Foley catheter in situ--- patient has fractures of the right lower and right upper extremities--nonweightbearing for at least 90 days -Patient having difficulty with purewick/bedbound/turning in bed -Given nonweightbearing status with acute fractures continue Foley catheter for now -Foley placed 01/10/2021, removed 01/12/2021 - will continue to monitor closely for urinary retention  11)Meals--patient is nonweightbearing on the right upper extremity patient needs total assistance with feeding  12)Chronic Anemia--- hemoglobin stable at this time -Patient  has had chronic anemia for long time -H&H remained stable  Difficult placement:  Disposition/Need for in-Hospital Stay- patient unable to be discharged at this time due to ---  Progressive debility due to right knee right elbow fractures,   patient is nonweightbearing, status post fall at home, No safe discharge plan at this time-- awaiting insurance approval for transfer to SNF rehab  I have reached out to [facility] for peer to peer review  Called [phone] option 3. Her policy # is [id number]. Got a call back from [employer] Dr. Floreen Comber stating she is total care - needs a prolonged care and they will not cover the, per PT OT note needs total care cannot follow instructions.... I have placed a reconsult note for PT/OT   DVT prophylaxis:Lovenox Code Status: Full Family Communication: Discussed with son on phone 4/30 Disposition Plan:  Status is: Inpatient  Remains inpatient appropriate because:Unsafe d/c plan, IV treatments appropriate due to intensity of illness or inability to take PO and Inpatient level of care appropriate due to  severity of illness   Dispo: The patient is from: Home              Anticipated d/c is to: SNF              Patient currently is medically stable to d/c.   Difficult to place patient Yes   Consultants:   Orthopedics  Procedures:   See below  Antimicrobials:   None   Subjective: Patient seen and evaluated today with no new acute complaints or concerns. No acute concerns or events noted overnight. She denies any significant pain complaints.  Objective: Vitals:   01/14/21 1437 01/14/21 2124 01/15/21 0503 01/15/21 1436  BP: (!) 198/78 (!) 158/70 (!) 174/64 (!) 124/56  Pulse: 62 (!) 56 (!) 53 (!) 57  Resp: 18 16  16   Temp: 97.6 F (36.4 C) 98.1 F (36.7 C) 97.7 F (36.5 C) 97.8 F (36.6 C)  TempSrc: Oral Oral Oral   SpO2: 98% 98% 95% 100%  Weight:      Height:        Intake/Output Summary (Last 24 hours) at 01/15/2021 1526 Last data filed at 01/15/2021 0930 Gross per 24 hour  Intake 240 ml  Output 650 ml  Net -410 ml   Filed Weights   01/08/21 1546  Weight: 85.3 kg    Examination:  General exam: Appears calm and comfortable  Respiratory system: Clear to auscultation. Respiratory effort normal. Cardiovascular system: S1 & S2 heard, RRR.  Gastrointestinal system: Abdomen is soft Central nervous system: Alert and awake Extremities: No edema, R elbow and knee fractures in sling/cast Skin: No significant lesions noted Psychiatry: Flat affect.    Data Reviewed: I have personally reviewed following labs and imaging studies  CBC: Recent Labs  Lab 01/08/21 1834 01/09/21 5732 01/10/21 0615 01/12/21 2025 01/13/21 0831  01/14/21 0519  WBC 9.0 10.7* 11.4* 7.7 8.0 9.2  NEUTROABS 7.0  --   --   --   --   --   HGB 10.8* 10.3* 10.8* 9.2* 9.4* 11.1*  HCT 32.2* 32.0* 33.4* 28.8* 30.1* 35.0*  MCV 92.3 93.6 95.2 96.3 95.6 94.9  PLT 269 275 270 208 251 295   Basic Metabolic Panel: Recent Labs  Lab 01/08/21 1834 01/09/21 0656 01/10/21 0615 01/11/21 0903  01/13/21 0831 01/14/21 0519 01/15/21 0604  NA 134* 136 137 137 137 139 140  K 2.3* 2.6* 3.9 3.8 4.2 4.3 4.2  CL 101 103 106 109 112* 111 113*  CO2 23 21* 20* 19* 18* 20* 20*  GLUCOSE 121* 136* 129* 117* 105* 98 122*  BUN 34* 32* 34* 35* 35* 38* 44*  CREATININE 2.86* 2.42* 2.56* 2.45* 2.28* 2.19* 2.44*  CALCIUM 8.1* 8.2* 9.0 9.0 8.6* 9.3 9.0  MG 1.3* 1.5*  --   --   --   --   --   PHOS  --  3.8  --   --   --   --   --    GFR: Estimated Creatinine Clearance: 23.1 mL/min (A) (by C-G formula based on SCr of 2.44 mg/dL (H)). Liver Function Tests: Recent Labs  Lab 01/09/21 0656  AST 13*  ALT 12  ALKPHOS 69  BILITOT 1.7*  PROT 5.4*  ALBUMIN 3.0*   No results for input(s): LIPASE, AMYLASE in the last 168 hours. No results for input(s): AMMONIA in the last 168 hours. Coagulation Profile: Recent Labs  Lab 01/09/21 0656  INR 1.0   Cardiac Enzymes: No results for input(s): CKTOTAL, CKMB, CKMBINDEX, TROPONINI in the last 168 hours. BNP (last 3 results) No results for input(s): PROBNP in the last 8760 hours. HbA1C: No results for input(s): HGBA1C in the last 72 hours. CBG: Recent Labs  Lab 01/14/21 1939 01/15/21 0028 01/15/21 0500 01/15/21 0733 01/15/21 1225  GLUCAP 128* 119* 111* 123* 131*   Lipid Profile: No results for input(s): CHOL, HDL, LDLCALC, TRIG, CHOLHDL, LDLDIRECT in the last 72 hours. Thyroid Function Tests: No results for input(s): TSH, T4TOTAL, FREET4, T3FREE, THYROIDAB in the last 72 hours. Anemia Panel: No results for input(s): VITAMINB12, FOLATE, FERRITIN, TIBC, IRON, RETICCTPCT in the last 72 hours. Sepsis Labs: No results for input(s): PROCALCITON, LATICACIDVEN in the last 168 hours.  Recent Results (from the past 240 hour(s))  Resp Panel by RT-PCR (Flu A&B, Covid) Nasopharyngeal Swab     Status: None   Collection Time: 01/08/21  5:32 PM   Specimen: Nasopharyngeal Swab; Nasopharyngeal(NP) swabs in vial transport medium  Result Value Ref Range  Status   SARS Coronavirus 2 by RT PCR NEGATIVE NEGATIVE Final    Comment: (NOTE) SARS-CoV-2 target nucleic acids are NOT DETECTED.  The SARS-CoV-2 RNA is generally detectable in upper respiratory specimens during the acute phase of infection. The lowest concentration of SARS-CoV-2 viral copies this assay can detect is 138 copies/mL. A negative result does not preclude SARS-Cov-2 infection and should not be used as the sole basis for treatment or other patient management decisions. A negative result may occur with  improper specimen collection/handling, submission of specimen other than nasopharyngeal swab, presence of viral mutation(s) within the areas targeted by this assay, and inadequate number of viral copies(<138 copies/mL). A negative result must be combined with clinical observations, patient history, and epidemiological information. The expected result is Negative.  Fact Sheet for Patients:  EntrepreneurPulse.com.au  Fact Sheet for Healthcare  Providers:  IncredibleEmployment.be  This test is no t yet approved or cleared by the [place] and  has been authorized for detection and/or diagnosis of SARS-CoV-2 by FDA under an Emergency Use Authorization (EUA). This EUA will remain  in effect (meaning this test can be used) for the duration of the COVID-19 declaration under Section 564(b)(1) of the Act, 21 U.S.C.section 360bbb-3(b)(1), unless the authorization is terminated  or revoked sooner.       Influenza A by PCR NEGATIVE NEGATIVE Final   Influenza B by PCR NEGATIVE NEGATIVE Final    Comment: (NOTE) The Xpert Xpress SARS-CoV-2/FLU/RSV plus assay is intended as an aid in the diagnosis of influenza from Nasopharyngeal swab specimens and should not be used as a sole basis for treatment. Nasal washings and aspirates are unacceptable for Xpert Xpress SARS-CoV-2/FLU/RSV testing.  Fact Sheet for  Patients: EntrepreneurPulse.com.au  Fact Sheet for Healthcare Providers: IncredibleEmployment.be  This test is not yet approved or cleared by the [place] FDA and has been authorized for detection and/or diagnosis of SARS-CoV-2 by FDA under an Emergency Use Authorization (EUA). This EUA will remain in effect (meaning this test can be used) for the duration of the COVID-19 declaration under Section 564(b)(1) of the Act, 21 U.S.C. section 360bbb-3(b)(1), unless the authorization is terminated or revoked.  Performed at [facility], [address], [place],  [address]          Radiology Studies: No results found.      Scheduled Meds: . ALPRAZolam  0.5 mg Oral BID  . amLODipine  10 mg Oral Daily  . Chlorhexidine Gluconate Cloth  6 each Topical Daily  . donepezil  5 mg Oral Daily  . enoxaparin (LOVENOX) injection  30 mg Subcutaneous Q24H  . hydrALAZINE  50 mg Oral Q8H  . insulin aspart  0-9 Units Subcutaneous Q4H  . labetalol  200 mg Oral BID  . methocarbamol  750 mg Oral TID  . nicotine  21 mg Transdermal Daily  . polyethylene glycol  17 g Oral Daily  . rosuvastatin  5 mg Oral Daily  . senna-docusate  2 tablet Oral QHS  . sertraline  50 mg Oral Daily    LOS: 7 days    Time spent: 35 minutes    Loura Pitt Darleen Crocker, DO [facility]  If 7PM-7AM, please contact night-coverage [email or web address] 01/15/2021, 3:26 PM

## 2021-01-16 DIAGNOSIS — S7291XA Unspecified fracture of right femur, initial encounter for closed fracture: Secondary | ICD-10-CM | POA: Diagnosis not present

## 2021-01-16 LAB — GLUCOSE, CAPILLARY
Glucose-Capillary: 110 mg/dL — ABNORMAL HIGH (ref 70–99)
Glucose-Capillary: 140 mg/dL — ABNORMAL HIGH (ref 70–99)
Glucose-Capillary: 143 mg/dL — ABNORMAL HIGH (ref 70–99)
Glucose-Capillary: 145 mg/dL — ABNORMAL HIGH (ref 70–99)
Glucose-Capillary: 157 mg/dL — ABNORMAL HIGH (ref 70–99)
Glucose-Capillary: 170 mg/dL — ABNORMAL HIGH (ref 70–99)
Glucose-Capillary: 175 mg/dL — ABNORMAL HIGH (ref 70–99)

## 2021-01-16 LAB — BASIC METABOLIC PANEL
Anion gap: 6 (ref 5–15)
BUN: 47 mg/dL — ABNORMAL HIGH (ref 8–23)
CO2: 21 mmol/L — ABNORMAL LOW (ref 22–32)
Calcium: 8.7 mg/dL — ABNORMAL LOW (ref 8.9–10.3)
Chloride: 112 mmol/L — ABNORMAL HIGH (ref 98–111)
Creatinine, Ser: 2.86 mg/dL — ABNORMAL HIGH (ref 0.44–1.00)
GFR, Estimated: 17 mL/min — ABNORMAL LOW (ref >60)
Glucose, Bld: 130 mg/dL — ABNORMAL HIGH (ref 70–99)
Potassium: 4.3 mmol/L (ref 3.5–5.1)
Sodium: 139 mmol/L (ref 135–145)

## 2021-01-16 MED ORDER — HYDROMORPHONE HCL 1 MG/ML IJ SOLN
0.5000 mg | INTRAMUSCULAR | Status: DC | PRN
  Administered 2021-01-16 – 2021-01-21 (×14): 0.5 mg via INTRAVENOUS
  Filled 2021-01-16 (×14): qty 0.5

## 2021-01-16 MED ORDER — METHOCARBAMOL 500 MG PO TABS
750.0000 mg | ORAL_TABLET | Freq: Three times a day (TID) | ORAL | Status: DC | PRN
  Administered 2021-01-18: 750 mg via ORAL
  Filled 2021-01-16: qty 2

## 2021-01-16 MED ORDER — LACTATED RINGERS IV SOLN: INTRAVENOUS | Status: DC

## 2021-01-16 MED ORDER — ALPRAZOLAM 0.5 MG PO TABS: 0.5000 mg | ORAL_TABLET | Freq: Two times a day (BID) | ORAL | Status: DC | PRN

## 2021-01-16 NOTE — Progress Notes (Signed)
PROGRESS NOTE    [name]  QPY:[id number] DOB: 1950/10/12 DOA: 01/08/2021 PCP: [name], MD   Brief Narrative:   70 y.o.femalewith who is a reformed smoker with medical history significant forHTN, HLD, T2DM, dementia and obesity, COPD admitted on 01/08/2021 after falling at home with right distal humerus/elbow and right distal femur/knee fracture, not amenable to surgical fixation, requiring prolonged rehab.  Her creatinine levels are now elevating and she will be started on some IV fluid.  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   Right Distal Humeral Fracture/Rt Elbow Fracture-Not Amenable to Operative Fixation   AKI (acute kidney injury) ([place])   Hypokalemia   Fall at home, initial encounter   Hyperlipidemia   Type 2 diabetes mellitus without complication ([place])   Obesity (BMI 30.0-34.9)   Dementia without behavioral disturbance (HCC)   Hypomagnesemia   COPD (chronic obstructive pulmonary disease) (HCC)   1)Rt distal femur/knee fracture- -mobility remains limited due to right arm and right leg fracture, pain with repositioning  Continue as needed analgesics for optimal pain control -discussed with orthopedic surgeon Dr. Aline Brochure, who also discussed with orthopedic surgeon on-call at [facility]--- after reviewing imaging studies and further orthopedic consultation- -They advised rehab on conservative medical management -Apparently fracture location is Not amenable to operative fixation -Patient will have to be nonweightbearing for up to 90 days -Will need rehab and pain control -PT/OT eval -recommendedSNF, insurance has denied the patient. I have reached out to [facility] for peer to peer review Dr. Floreen Comber [phone] option 3.  Her policy # is [id number]. They believe the patient is total custodial and they do not cover long-term care  -PT continue to work with patient, she follows command,and instructions  2)Right distal humerus/elbow fracture- -immobile right upper extremity, -- as per orthopedic surgeon not amenable to operative fixation -Casting and conservative treatment with rehab advised -Not weightbearing on right upper extremity for 90 days -PT/OT eval-pending SNF  3)HTN-- -BPremained elevated, we will switch her atenolol to labetalol,and p.o. hydralazine --Continue amlodipine 10 mg daily (Initiating anxiolytics, Xanax and titrating pain medication for better pain control which may help with her blood pressure)  -BP improving after medication adjustments and with improved pain control  Stopped chlorthalidone due to persistent electrolyte abnormalities -Stop verapamil due to concerns for bradycardia with concomitant use of atenolol - Avoiding ACEI/ARB/ARNI due to renal concerns   4)DM2-recent A1c prior A1c was 8.7 reflecting uncontrolled diabetes with hyperglycemia PTA -At home patient was supposed to be taking [place] but had not been compliant lately -We will continue CBGs q. 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 Question if this is the new baseline? -Avoiding nephrotoxins, dehydration, hypotension -Stopped chlorthalidone due to persistent electrolyte derangement -Started IVF 5/1  6)Dementia with some cognitive and memory deficits- -currently on Aricept -May use lorazepam as needed agitation   7)Recurrentfalls---PTA pt lived alone and did very poorly,patient has significant limitations with mobility related ADLs-this patient needs to continue to be monitored in the hospital until a SNF bed is obtained as she is Not  safe to go home with her current physcical limitations -right knee  and right elbow fractures, patient is nonweightbearing at this time due to acute fractures - status post fall at home, No safe discharge plan at this time* -PT/OT eval recommending SNF rehab  8) tobacco abuse----smoking cessation advised, may use nicotine patch  9) recurrent urinary retention-Foley catheter in situ--- patient has fractures of the right lower and right upper extremities--nonweightbearing for at least 90 days -Patient having difficulty with purewick/bedbound/turning in bed -Given nonweightbearing status with acute fractures continue Foley catheter for now -Foley placed 01/10/2021, removed 01/12/2021 -will continue to monitor closely for urinary retention  10)Meals--patient is nonweightbearing on the right upper extremity patient needs total assistance with feeding  11)Chronic Anemia--- hemoglobin stable at this time -Patient has had chronic anemia for long time -H&H remained stable  Difficult placement:  Disposition/Need for in-Hospital Stay- patient unable to be discharged at this time due to ---  Progressive debility due to right knee right elbow fractures,  patient is nonweightbearing, status post fall at home, No safe discharge plan at this time-- awaiting insurance approval for transfer to SNF rehab  I have reached out to [facility] for peer to peer review  Called [phone] option 3. Her policy # is [id number]. Got a call back from [employer] Dr. Floreen Comber stating she is total care - needs a prolonged care and they will not cover the, per PT OT note needs total care cannot follow instructions.... I have placed a reconsult note for PT/OT   DVT prophylaxis:Lovenox Code Status: Full Family Communication: Discussed with son on phone 5/1 Disposition Plan:  Status is: Inpatient  Remains inpatient appropriate because:Unsafe d/c plan, IV treatments appropriate due to intensity of illness or  inability to take PO and Inpatient level of care appropriate due to severity of illness   Dispo: The patient is from: Home  Anticipated d/c is to: SNF  Patient currently is medically stable to d/c.              Difficult to place patient Yes   Consultants:   Orthopedics  Procedures:   See below  Antimicrobials:   None  Subjective: Patient seen and evaluated today with some somnolence and lethargy noted this morning.  She is also continued to have some ongoing pain.  Objective: Vitals:   01/15/21 1436 01/15/21 2104 01/16/21 0505 01/16/21 0910  BP: (!) 124/56 104/61 (!) 99/57 138/62  Pulse: (!) 57 (!) 57 60 (!) 58  Resp: 16 16 16    Temp: 97.8 F (36.6 C) 98.3 F (36.8 C) (!) 97.4 F (36.3 C)   TempSrc:  Oral Oral   SpO2: 100% 100% 95% 94%  Weight:      Height:        Intake/Output Summary (Last 24 hours) at 01/16/2021 1406 Last data filed at 01/16/2021 0600 Gross per 24 hour  Intake 100 ml  Output 651 ml  Net -551 ml   Filed Weights   01/08/21 1546  Weight: 85.3 kg    Examination:  General exam: Appears calm and comfortable, mildly somnolent Respiratory system: Clear to auscultation. Respiratory effort normal. Cardiovascular system: S1 & S2 heard, RRR.  Gastrointestinal system: Abdomen is soft Central nervous system: Alert and awake Extremities: Right upper and lower extremity in sling/cast Skin: No significant lesions noted Psychiatry: Flat affect.    Data Reviewed: I have personally reviewed following labs and imaging studies  CBC: Recent Labs  Lab 01/10/21 0615 01/12/21 0635 01/13/21 0831 01/14/21 0519  WBC 11.4* 7.7 8.0 9.2  HGB 10.8* 9.2* 9.4*  11.1*  HCT 33.4* 28.8* 30.1* 35.0*  MCV 95.2 96.3 95.6 94.9  PLT 270 208 251 825   Basic Metabolic Panel: Recent Labs  Lab 01/11/21 0903 01/13/21 0831 01/14/21 0519 01/15/21 0604 01/16/21 0436  NA 137 137 139 140 139  K 3.8 4.2 4.3 4.2 4.3  CL 109 112* 111  113* 112*  CO2 19* 18* 20* 20* 21*  GLUCOSE 117* 105* 98 122* 130*  BUN 35* 35* 38* 44* 47*  CREATININE 2.45* 2.28* 2.19* 2.44* 2.86*  CALCIUM 9.0 8.6* 9.3 9.0 8.7*   GFR: Estimated Creatinine Clearance: 19.7 mL/min (A) (by C-G formula based on SCr of 2.86 mg/dL (H)). Liver Function Tests: No results for input(s): AST, ALT, ALKPHOS, BILITOT, PROT, ALBUMIN in the last 168 hours. No results for input(s): LIPASE, AMYLASE in the last 168 hours. No results for input(s): AMMONIA in the last 168 hours. Coagulation Profile: No results for input(s): INR, PROTIME in the last 168 hours. Cardiac Enzymes: No results for input(s): CKTOTAL, CKMB, CKMBINDEX, TROPONINI in the last 168 hours. BNP (last 3 results) No results for input(s): PROBNP in the last 8760 hours. HbA1C: No results for input(s): HGBA1C in the last 72 hours. CBG: Recent Labs  Lab 01/15/21 2012 01/16/21 0002 01/16/21 0422 01/16/21 0750 01/16/21 1154  GLUCAP 204* 175* 157* 110* 143*   Lipid Profile: No results for input(s): CHOL, HDL, LDLCALC, TRIG, CHOLHDL, LDLDIRECT in the last 72 hours. Thyroid Function Tests: No results for input(s): TSH, T4TOTAL, FREET4, T3FREE, THYROIDAB in the last 72 hours. Anemia Panel: No results for input(s): VITAMINB12, FOLATE, FERRITIN, TIBC, IRON, RETICCTPCT in the last 72 hours. Sepsis Labs: No results for input(s): PROCALCITON, LATICACIDVEN in the last 168 hours.  Recent Results (from the past 240 hour(s))  Resp Panel by RT-PCR (Flu A&B, Covid) Nasopharyngeal Swab     Status: None   Collection Time: 01/08/21  5:32 PM   Specimen: Nasopharyngeal Swab; Nasopharyngeal(NP) swabs in vial transport medium  Result Value Ref Range Status   SARS Coronavirus 2 by RT PCR NEGATIVE NEGATIVE Final    Comment: (NOTE) SARS-CoV-2 target nucleic acids are NOT DETECTED.  The SARS-CoV-2 RNA is generally detectable in upper respiratory specimens during the acute phase of infection. The  lowest concentration of SARS-CoV-2 viral copies this assay can detect is 138 copies/mL. A negative result does not preclude SARS-Cov-2 infection and should not be used as the sole basis for treatment or other patient management decisions. A negative result may occur with  improper specimen collection/handling, submission of specimen other than nasopharyngeal swab, presence of viral mutation(s) within the areas targeted by this assay, and inadequate number of viral copies(<138 copies/mL). A negative result must be combined with clinical observations, patient history, and epidemiological information. The expected result is Negative.  Fact Sheet for Patients:  EntrepreneurPulse.com.au  Fact Sheet for Healthcare Providers:  IncredibleEmployment.be  This test is no t yet approved or cleared by the [place] FDA and  has been authorized for detection and/or diagnosis of SARS-CoV-2 by FDA under an Emergency Use Authorization (EUA). This EUA will remain  in effect (meaning this test can be used) for the duration of the COVID-19 declaration under Section 564(b)(1) of the Act, 21 U.S.C.section 360bbb-3(b)(1), unless the authorization is terminated  or revoked sooner.       Influenza A by PCR NEGATIVE NEGATIVE Final   Influenza B by PCR NEGATIVE NEGATIVE Final    Comment: (NOTE) The Xpert Xpress SARS-CoV-2/FLU/RSV plus assay is intended as an  aid in the diagnosis of influenza from Nasopharyngeal swab specimens and should not be used as a sole basis for treatment. Nasal washings and aspirates are unacceptable for Xpert Xpress SARS-CoV-2/FLU/RSV testing.  Fact Sheet for Patients: EntrepreneurPulse.com.au  Fact Sheet for Healthcare Providers: IncredibleEmployment.be  This test is not yet approved or cleared by the [place] FDA and has been authorized for detection and/or diagnosis of SARS-CoV-2 by FDA under  an Emergency Use Authorization (EUA). This EUA will remain in effect (meaning this test can be used) for the duration of the COVID-19 declaration under Section 564(b)(1) of the Act, 21 U.S.C. section 360bbb-3(b)(1), unless the authorization is terminated or revoked.  Performed at [facility], [address], [place], [place] [address]          Radiology Studies: No results found.      Scheduled Meds: . Chlorhexidine Gluconate Cloth  6 each Topical Daily  . donepezil  5 mg Oral Daily  . enoxaparin (LOVENOX) injection  30 mg Subcutaneous Q24H  . insulin aspart  0-9 Units Subcutaneous Q4H  . nicotine  21 mg Transdermal Daily  . polyethylene glycol  17 g Oral Daily  . rosuvastatin  5 mg Oral Daily  . senna-docusate  2 tablet Oral QHS   Continuous Infusions: . lactated ringers 75 mL/hr at 01/16/21 0928     LOS: 8 days    Time spent: 35 minutes    Jaydy Fitzhenry Darleen Crocker, DO [facility]  If 7PM-7AM, please contact night-coverage [email or web address] 01/16/2021, 2:06 PM

## 2021-01-17 ENCOUNTER — Encounter (INDEPENDENT_AMBULATORY_CARE_PROVIDER_SITE_OTHER): Payer: Medicare Other | Admitting: Ophthalmology

## 2021-01-17 DIAGNOSIS — S7291XA Unspecified fracture of right femur, initial encounter for closed fracture: Secondary | ICD-10-CM | POA: Diagnosis not present

## 2021-01-17 LAB — GLUCOSE, CAPILLARY
Glucose-Capillary: 109 mg/dL — ABNORMAL HIGH (ref 70–99)
Glucose-Capillary: 109 mg/dL — ABNORMAL HIGH (ref 70–99)
Glucose-Capillary: 110 mg/dL — ABNORMAL HIGH (ref 70–99)
Glucose-Capillary: 112 mg/dL — ABNORMAL HIGH (ref 70–99)
Glucose-Capillary: 124 mg/dL — ABNORMAL HIGH (ref 70–99)
Glucose-Capillary: 159 mg/dL — ABNORMAL HIGH (ref 70–99)

## 2021-01-17 LAB — URINALYSIS, ROUTINE W REFLEX MICROSCOPIC
Bilirubin Urine: NEGATIVE
Glucose, UA: NEGATIVE mg/dL
Hgb urine dipstick: NEGATIVE
Ketones, ur: NEGATIVE mg/dL
Nitrite: POSITIVE — AB
Protein, ur: >=300 mg/dL — AB
Specific Gravity, Urine: 1.012 (ref 1.005–1.030)
WBC, UA: >50 WBC/hpf — ABNORMAL HIGH (ref 0–5)
pH: 7 (ref 5.0–8.0)

## 2021-01-17 LAB — BASIC METABOLIC PANEL
Anion gap: 8 (ref 5–15)
BUN: 48 mg/dL — ABNORMAL HIGH (ref 8–23)
CO2: 18 mmol/L — ABNORMAL LOW (ref 22–32)
Calcium: 8.7 mg/dL — ABNORMAL LOW (ref 8.9–10.3)
Chloride: 112 mmol/L — ABNORMAL HIGH (ref 98–111)
Creatinine, Ser: 2.64 mg/dL — ABNORMAL HIGH (ref 0.44–1.00)
GFR, Estimated: 19 mL/min — ABNORMAL LOW (ref >60)
Glucose, Bld: 119 mg/dL — ABNORMAL HIGH (ref 70–99)
Potassium: 4.5 mmol/L (ref 3.5–5.1)
Sodium: 138 mmol/L (ref 135–145)

## 2021-01-17 LAB — MAGNESIUM: Magnesium: 1.9 mg/dL (ref 1.7–2.4)

## 2021-01-17 NOTE — Progress Notes (Signed)
Occupational Therapy Treatment Patient Details Name: [name] MRN: [id number] DOB: 08-08-1951 Today's Date: 01/17/2021    History of present illness HPI: [name] is a 70 y.o. female with medical history significant for hypertension, hyperlipidemia, T2DM, dementia and obesity who presents to the emergency department after sustaining a fall at home prior to arrival.  History was obtained from ED physician and son at bedside.  Per son, patient lives alone, but son checks in on her regularly, he took away her car keys due to dementia, a neighbor saw her fall while trying to get into locked car and landed on her right side.  She complained of right arm, right knee and right hip pain.  She was unable to get up after the fall, EMS was activated and patient was sent to the ED for further evaluation and management.   OT comments  Pt continues to be limited by R UE and R LE pain. Pt requires Max A for supine to sit and sit to supine bed mobility. Pt able to sit at EOB with fair static sitting balance. Pt tolerated x5 PROM for L shoulder flexion and abduction with OT. Pt able to sit at EOB for ~5 to 10 minutes. Pt will benefit from continued OT in the hospital setting to improve deficit areas.    Follow Up Recommendations  SNF;Supervision/Assistance - 24 hour    Equipment Recommendations  None recommended by OT          Precautions / Restrictions Precautions Precautions: Fall Type of Shoulder Precautions: NWB R LE and R UE Restrictions Weight Bearing Restrictions: Yes RUE Weight Bearing: Non weight bearing RLE Weight Bearing: Non weight bearing       Mobility Bed Mobility Overal bed mobility: Needs Assistance Bed Mobility: Supine to Sit;Sit to Supine     Supine to sit: Max assist Sit to supine: Max assist   General bed mobility comments: patient limited due to c/o severe pain right knee and shoulder                          Balance Overall balance  assessment: Needs assistance Sitting-balance support: Feet supported;Single extremity supported Sitting balance-Leahy Scale: Fair Sitting balance - Comments: fair/good seated EOB                                   ADL either performed or assessed with clinical judgement   ADL Overall ADL's : Needs assistance/impaired                                                               Cognition Arousal/Alertness: Awake/alert Behavior During Therapy: WFL for tasks assessed/performed Overall Cognitive Status: History of cognitive impairments - at baseline                                          Exercises Exercises: General Upper Extremity General Exercises - Upper Extremity Shoulder Flexion: PROM;5 reps;Seated;Left (EOB) Shoulder ABduction: PROM;5 reps;Seated;Left (EOB)                Pertinent Vitals/ Pain  Faces Pain Scale: Hurts worst Pain Location: RUE, RLE Pain Descriptors / Indicators: Grimacing;Guarding;Sore;Crying Pain Intervention(s): Limited activity within patient's tolerance                                                          Frequency  Min 2X/week        Progress Toward Goals  OT Goals(current goals can now be found in the care plan section)  Progress towards OT goals: Progressing toward goals  Acute Rehab OT Goals Patient Stated Goal: return home OT Goal Formulation: With patient Time For Goal Achievement: 01/25/21 Potential to Achieve Goals: Fair ADL Goals Pt Will Perform Eating: with min assist;sitting;with supervision Pt Will Perform Grooming: with supervision;with min assist;sitting Pt Will Perform Upper Body Dressing: with min assist;sitting Pt Will Perform Lower Body Dressing: with mod assist;sitting/lateral leans;bed level;with adaptive equipment Pt Will Transfer to Toilet: with mod assist;stand pivot transfer;squat pivot transfer;with transfer  board;bedside commode;grab bars  Plan Discharge plan remains appropriate    Co-evaluation    PT/OT/SLP Co-Evaluation/Treatment: Yes Reason for Co-Treatment: Complexity of the patient's impairments (multi-system involvement)   OT goals addressed during session: Strengthening/ROM;ADL's and self-care                          End of Session Equipment Utilized During Treatment:  (R UE sling repositioned during session. Pillow placed under R elbow to assist with mild abductiona and increased elbow flexion to near 90*.)  OT Visit Diagnosis: Unsteadiness on feet (R26.81);Repeated falls (R29.6);Muscle weakness (generalized) (M62.81);History of falling (Z91.81);Pain Pain - Right/Left: Right Pain - part of body: Arm;Leg   Activity Tolerance Patient limited by pain   Patient Left in bed;with call bell/phone within reach;with bed alarm set             Time: 3299-2426 OT Time Calculation (min): 27 min  Charges: OT General Charges $OT Visit: 1 Visit OT Treatments $Self Care/Home Management : 8-22 mins $Therapeutic Exercise: 8-22 mins  Lakita Sahlin OT, MOT    Larey Seat 01/17/2021, 10:16 AM

## 2021-01-17 NOTE — TOC Progression Note (Signed)
Transition of Care [facility]) - Progression Note    Patient Details  Name: [name] MRN: [id number] Date of Birth: Sep 30, 1950  Transition [facility]) CM/SW Contact  Salome Arnt, [place] Phone Number: 01/17/2021, 2:20 PM  Clinical Narrative:  Received voicemail from [employer] stating appeal was denied but would be going to second level appeal process. LCSW updated MD, [facility], and pt's son and daughter-in-law. Son reports they are unable to pay privately, so he may have to take leave of absence from work if appeal is ultimately denied. Per MD, pt is not medically stable for d/c at this time. Will continue to follow.     Expected Discharge Plan: Skilled Nursing Facility Barriers to Discharge: Insurance Authorization  Expected Discharge Plan and Services Expected Discharge Plan: [place]       Living arrangements for the past 2 months: Single Family Home                                       Social Determinants of Health (SDOH) Interventions    Readmission Risk Interventions Readmission Risk Prevention Plan 01/11/2021  Medication Screening Complete  Transportation Screening Complete  Some recent data might be hidden

## 2021-01-17 NOTE — Progress Notes (Signed)
PROGRESS NOTE    [name]  MLY:[id number] DOB: 06/30/1951 DOA: 01/08/2021 PCP: [name], MD   Brief Narrative:   70 y.o.femalewith who is a reformed smoker with medical history significant forHTN, HLD, T2DM, dementia and obesity, COPD admitted on 01/08/2021 after falling at home with right distal humerus/elbow and right distal femur/knee fracture, not amenable to surgical fixation, requiring prolonged rehab.  Her creatinine levels are now elevating and she will be started on some IV fluid.  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   Right Distal Humeral Fracture/Rt Elbow Fracture-Not Amenable to Operative Fixation   AKI (acute kidney injury) ([place])   Hypokalemia   Fall at home, initial encounter   Hyperlipidemia   Type 2 diabetes mellitus without complication ([place])   Obesity (BMI 30.0-34.9)   Dementia without behavioral disturbance (HCC)   Hypomagnesemia   COPD (chronic obstructive pulmonary disease) (HCC)   1)Rt distal femur/knee fracture- -mobility remains limited due to right arm and right leg fracture, pain with repositioning  Continue as needed analgesics for optimal pain control -discussed with orthopedic surgeon Dr. Aline Brochure, who also discussed with orthopedic surgeon on-call at [facility]--- after reviewing imaging studies and further orthopedic consultation- -They advised rehab on conservative medical management -Apparently fracture location is Not amenable to operative fixation -Patient will have to be nonweightbearing for up to 90 days -Will need rehab and pain control -PT/OT eval -recommendedSNF, insurance has denied the patient. I have reached out to [facility] for peer to peer review Dr. Floreen Comber [phone] option 3.  Her policy # is [id number]. They believe the patient is total custodial and they do not cover long-term care  -PT continue to work with patient, she follows command,and instructions  2)Right distal humerus/elbow fracture- -immobile right upper extremity, -- as per orthopedic surgeon not amenable to operative fixation -Casting and conservative treatment with rehab advised -Not weightbearing on right upper extremity for 90 days -PT/OT eval-pending SNF  3)HTN-- -BPremained elevated, we will switch her atenolol to labetalol,and p.o. hydralazine --Continue amlodipine 10 mg daily (Initiating anxiolytics, Xanax and titrating pain medication for better pain control which may help with her blood pressure)  -BP improving after medication adjustments and with improved pain control  Stopped chlorthalidone due to persistent electrolyte abnormalities -Stop verapamil due to concerns for bradycardia with concomitant use of atenolol - Avoiding ACEI/ARB/ARNI due to renal concerns   4)DM2-recent A1c prior A1c was 8.7 reflecting uncontrolled diabetes with hyperglycemia PTA -At home patient was supposed to be taking [place] but had not been compliant lately -We will continue CBGs q. 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 Question if this is the new baseline? -Avoiding nephrotoxins, dehydration, hypotension -Stopped chlorthalidone due to persistent electrolyte derangement -Started IVF 5/1  6)Dementia with some cognitive and memory deficits- -currently on Aricept -May use lorazepam as needed agitation -Continues to have some ongoing lethargy and confusion with poor oral intake, continue IV fluid through today and await further decision from insurance company   7)Recurrentfalls---PTA pt lived alone and did very poorly,patient has  significant limitations with mobility related ADLs-this patient needs to continue to be monitored  in the hospital until a SNF bed is obtained as she is Not safe to go home with her current physcical limitations -right knee and right elbow fractures, patient is nonweightbearing at this time due to acute fractures - status post fall at home, No safe discharge plan at this time* -PT/OT eval recommending SNF rehab  8) tobacco abuse----smoking cessation advised, may use nicotine patch  9) recurrent urinary retention-Foley catheter in situ--- patient has fractures of the right lower and right upper extremities--nonweightbearing for at least 90 days -Patient having difficulty with purewick/bedbound/turning in bed -Given nonweightbearing status with acute fractures continue Foley catheter for now -Foley placed 01/10/2021, removed 01/12/2021 -will continue to monitor closely for urinary retention  10)Meals--patient is nonweightbearing on the right upper extremity patient needs total assistance with feeding  11)Chronic Anemia--- hemoglobin stable at this time -Patient has had chronic anemia for long time -H&H remained stable  Difficult placement:  Disposition/Need for in-Hospital Stay- patient unable to be discharged at this time due to ---  Progressive debility due to right knee right elbow fractures,  patient is nonweightbearing, status post fall at home, No safe discharge plan at this time-- awaiting insurance approval for transfer to SNF rehab  I have reached out to [facility] for peer to peer review  Called [phone] option 3. Her policy # is [id number]. Got a call back from [employer] Dr. Floreen Comber stating she is total care - needs a prolonged care and they will not cover the, per PT OT note needs total care cannot follow instructions.... I have placed a reconsult note for PT/OT   DVT prophylaxis:Lovenox Code Status:Full Family Communication:Discussed with son on phone  5/1 Disposition Plan: Status is: Inpatient  Remains inpatient appropriate because:Unsafe d/c plan, IV treatments appropriate due to intensity of illness or inability to take PO and Inpatient level of care appropriate due to severity of illness   Dispo: The patient is from:Home Anticipated d/c is to:SNF Patient currently is medically stable to d/c. Difficult to place patient Yes   Consultants:  Orthopedics  Procedures:  See below  Antimicrobials:  None  Subjective: Patient seen and evaluated today with ongoing lethargy and poor oral intake. States that her pain level is stable.  Objective: Vitals:   01/16/21 0910 01/16/21 1413 01/16/21 2133 01/17/21 0501  BP: 138/62 139/60 111/74 (!) 129/91  Pulse: (!) 58 (!) 59 64 63  Resp:  16 16 16   Temp:  97.8 F (36.6 C) 98.4 F (36.9 C) 97.9 F (36.6 C)  TempSrc:  Oral Oral Oral  SpO2: 94% 100% 97% 100%  Weight:      Height:        Intake/Output Summary (Last 24 hours) at 01/17/2021 1145 Last data filed at 01/17/2021 0854 Gross per 24 hour  Intake 649.05 ml  Output 300 ml  Net 349.05 ml   Filed Weights   01/08/21 1546  Weight: 85.3 kg    Examination:  General exam: Appears calm and comfortable, lethargic Respiratory system: Clear to auscultation. Respiratory effort normal. Cardiovascular system: S1 & S2 heard, RRR.  Gastrointestinal system: Abdomen is soft Central nervous system: Alert and awake Extremities: No edema, R Upper and lower extremity immobilizers Skin: No significant lesions noted Psychiatry: Flat affect.    Data Reviewed: I have personally reviewed following labs and imaging studies  CBC: Recent Labs  Lab 01/12/21 0635 01/13/21 0831 01/14/21 0519  WBC 7.7 8.0 9.2  HGB 9.2* 9.4* 11.1*  HCT 28.8* 30.1* 35.0*  MCV 96.3 95.6  94.9  PLT 208 251 834   Basic Metabolic Panel: Recent Labs  Lab 01/13/21 0831 01/14/21 0519 01/15/21 0604  01/16/21 0436 01/17/21 0632  NA 137 139 140 139 138  K 4.2 4.3 4.2 4.3 4.5  CL 112* 111 113* 112* 112*  CO2 18* 20* 20* 21* 18*  GLUCOSE 105* 98 122* 130* 119*  BUN 35* 38* 44* 47* 48*  CREATININE 2.28* 2.19* 2.44* 2.86* 2.64*  CALCIUM 8.6* 9.3 9.0 8.7* 8.7*  MG  --   --   --   --  1.9   GFR: Estimated Creatinine Clearance: 21.4 mL/min (A) (by C-G formula based on SCr of 2.64 mg/dL (H)). Liver Function Tests: No results for input(s): AST, ALT, ALKPHOS, BILITOT, PROT, ALBUMIN in the last 168 hours. No results for input(s): LIPASE, AMYLASE in the last 168 hours. No results for input(s): AMMONIA in the last 168 hours. Coagulation Profile: No results for input(s): INR, PROTIME in the last 168 hours. Cardiac Enzymes: No results for input(s): CKTOTAL, CKMB, CKMBINDEX, TROPONINI in the last 168 hours. BNP (last 3 results) No results for input(s): PROBNP in the last 8760 hours. HbA1C: No results for input(s): HGBA1C in the last 72 hours. CBG: Recent Labs  Lab 01/16/21 2131 01/17/21 0019 01/17/21 0415 01/17/21 0733 01/17/21 1120  GLUCAP 145* 112* 110* 124* 109*   Lipid Profile: No results for input(s): CHOL, HDL, LDLCALC, TRIG, CHOLHDL, LDLDIRECT in the last 72 hours. Thyroid Function Tests: No results for input(s): TSH, T4TOTAL, FREET4, T3FREE, THYROIDAB in the last 72 hours. Anemia Panel: No results for input(s): VITAMINB12, FOLATE, FERRITIN, TIBC, IRON, RETICCTPCT in the last 72 hours. Sepsis Labs: No results for input(s): PROCALCITON, LATICACIDVEN in the last 168 hours.  Recent Results (from the past 240 hour(s))  Resp Panel by RT-PCR (Flu A&B, Covid) Nasopharyngeal Swab     Status: None   Collection Time: 01/08/21  5:32 PM   Specimen: Nasopharyngeal Swab; Nasopharyngeal(NP) swabs in vial transport medium  Result Value Ref Range Status   SARS Coronavirus 2 by RT PCR NEGATIVE NEGATIVE Final    Comment: (NOTE) SARS-CoV-2 target nucleic acids are NOT DETECTED.  The  SARS-CoV-2 RNA is generally detectable in upper respiratory specimens during the acute phase of infection. The lowest concentration of SARS-CoV-2 viral copies this assay can detect is 138 copies/mL. A negative result does not preclude SARS-Cov-2 infection and should not be used as the sole basis for treatment or other patient management decisions. A negative result may occur with  improper specimen collection/handling, submission of specimen other than nasopharyngeal swab, presence of viral mutation(s) within the areas targeted by this assay, and inadequate number of viral copies(<138 copies/mL). A negative result must be combined with clinical observations, patient history, and epidemiological information. The expected result is Negative.  Fact Sheet for Patients:  EntrepreneurPulse.com.au  Fact Sheet for Healthcare Providers:  IncredibleEmployment.be  This test is no t yet approved or cleared by the [place] FDA and  has been authorized for detection and/or diagnosis of SARS-CoV-2 by FDA under an Emergency Use Authorization (EUA). This EUA will remain  in effect (meaning this test can be used) for the duration of the COVID-19 declaration under Section 564(b)(1) of the Act, 21 U.S.C.section 360bbb-3(b)(1), unless the authorization is terminated  or revoked sooner.       Influenza A by PCR NEGATIVE NEGATIVE Final   Influenza B by PCR NEGATIVE NEGATIVE Final    Comment: (NOTE) The Xpert Xpress SARS-CoV-2/FLU/RSV plus assay is intended  as an aid in the diagnosis of influenza from Nasopharyngeal swab specimens and should not be used as a sole basis for treatment. Nasal washings and aspirates are unacceptable for Xpert Xpress SARS-CoV-2/FLU/RSV testing.  Fact Sheet for Patients: EntrepreneurPulse.com.au  Fact Sheet for Healthcare Providers: IncredibleEmployment.be  This test is not yet approved or  cleared by the [place] FDA and has been authorized for detection and/or diagnosis of SARS-CoV-2 by FDA under an Emergency Use Authorization (EUA). This EUA will remain in effect (meaning this test can be used) for the duration of the COVID-19 declaration under Section 564(b)(1) of the Act, 21 U.S.C. section 360bbb-3(b)(1), unless the authorization is terminated or revoked.  Performed at [facility], [address] [place], [place] [address]          Radiology Studies: No results found.      Scheduled Meds: . Chlorhexidine Gluconate Cloth  6 each Topical Daily  . donepezil  5 mg Oral Daily  . enoxaparin (LOVENOX) injection  30 mg Subcutaneous Q24H  . insulin aspart  0-9 Units Subcutaneous Q4H  . nicotine  21 mg Transdermal Daily  . polyethylene glycol  17 g Oral Daily  . rosuvastatin  5 mg Oral Daily  . senna-docusate  2 tablet Oral QHS   Continuous Infusions: . lactated ringers 75 mL/hr at 01/16/21 0928     LOS: 9 days    Time spent: 35 minutes    Saquan Furtick Darleen Crocker, DO [facility]  If 7PM-7AM, please contact night-coverage [email or web address] 01/17/2021, 11:45 AM

## 2021-01-17 NOTE — Progress Notes (Signed)
Physical Therapy Treatment Patient Details Name: [name] MRN: [id number] DOB: [birth year] Today's Date: 01/17/2021    History of Present Illness HPI: [name] is a 70 y.o. female with medical history significant for hypertension, hyperlipidemia, T2DM, dementia and obesity who presents to the emergency department after sustaining a fall at home prior to arrival.  History was obtained from ED physician and son at bedside.  Per son, patient lives alone, but son checks in on her regularly, he took away her car keys due to dementia, a neighbor saw her fall while trying to get into locked car and landed on her right side.  She complained of right arm, right knee and right hip pain.  She was unable to get up after the fall, EMS was activated and patient was sent to the ED for further evaluation and management.    PT Comments    Patient apprehensive due to fear of increasing RUE/LE pain, but cooperative with encouragement.  Patient demonstrates slow labored movement for sitting up at bedside with c/o severe pain RUE/RLE, tolerated sitting up at bedside for approximately 20 minutes while receiving active assisted ROM exercises to LLE, unable to tolerated movement of RLE or attempt transfers due to c/o severe pain.  Patient required 2 person assist to reposition when put back to bed.  Patient will benefit from continued physical therapy in hospital and recommended venue below to increase strength, balance, endurance for safe ADLs and gait.    Follow Up Recommendations  SNF     Equipment Recommendations  None recommended by PT;Other (comment)    Recommendations for Other Services       Precautions / Restrictions Precautions Precautions: Fall Type of Shoulder Precautions: NWB R LE and R UE Required Braces or Orthoses: Sling Restrictions Weight Bearing Restrictions: Yes RUE Weight Bearing: Non weight bearing RLE Weight Bearing: Non weight bearing    Mobility  Bed Mobility Overal  bed mobility: Needs Assistance Bed Mobility: Supine to Sit;Sit to Supine       Sit to supine: Max assist   General bed mobility comments: patient limited due to c/o severe pain right knee and shoulder    Transfers                    Ambulation/Gait                 Stairs             Wheelchair Mobility    Modified Rankin (Stroke Patients Only)       Balance Overall balance assessment: Needs assistance Sitting-balance support: Feet supported;Single extremity supported Sitting balance-Leahy Scale: Fair Sitting balance - Comments: fair/good seated EOB                                    Cognition Arousal/Alertness: Awake/alert Behavior During Therapy: WFL for tasks assessed/performed Overall Cognitive Status: History of cognitive impairments - at baseline                                        Exercises General Exercises - Lower Extremity Long Arc Quad: Seated;AAROM;Strengthening;Both;10 reps Hip Flexion/Marching: Seated;AAROM;Strengthening;10 reps;Left    General Comments        Pertinent Vitals/Pain Pain Assessment: Faces Faces Pain Scale: Hurts worst Pain Location: RUE, RLE Pain Descriptors /  Indicators: Grimacing;Guarding;Sore;Crying Pain Intervention(s): Limited activity within patient's tolerance;Monitored during session;Repositioned    Home Living                      Prior Function            PT Goals (current goals can now be found in the care plan section) Acute Rehab PT Goals Patient Stated Goal: return home PT Goal Formulation: With patient Time For Goal Achievement: 01/25/21 Potential to Achieve Goals: Fair Progress towards PT goals: Progressing toward goals    Frequency    Min 3X/week      PT Plan Current plan remains appropriate    Co-evaluation PT/OT/SLP Co-Evaluation/Treatment: Yes Reason for Co-Treatment: Complexity of the patient's impairments (multi-system  involvement);To address functional/ADL transfers PT goals addressed during session: Mobility/safety with mobility;Proper use of DME;Balance;Strengthening/ROM        AM-PAC PT "6 Clicks" Mobility   Outcome Measure  Help needed turning from your back to your side while in a flat bed without using bedrails?: A Lot Help needed moving from lying on your back to sitting on the side of a flat bed without using bedrails?: A Lot Help needed moving to and from a bed to a chair (including a wheelchair)?: Total Help needed standing up from a chair using your arms (e.g., wheelchair or bedside chair)?: Total Help needed to walk in hospital room?: Total Help needed climbing 3-5 steps with a railing? : Total 6 Click Score: 8    End of Session   Activity Tolerance: Patient tolerated treatment well;Patient limited by fatigue;Patient limited by pain Patient left: in bed;with call bell/phone within reach;with bed alarm set Nurse Communication: Mobility status PT Visit Diagnosis: Unsteadiness on feet (R26.81);Other abnormalities of gait and mobility (R26.89);Muscle weakness (generalized) (M62.81)     Time: 6384-5364 PT Time Calculation (min) (ACUTE ONLY): 28 min  Charges:  $Therapeutic Exercise: 8-22 mins $Therapeutic Activity: 8-22 mins                     3:32 PM, 01/17/21 Lonell Grandchild, MPT Physical Therapist with [facility] [phone] office [phone] mobile phone

## 2021-01-18 ENCOUNTER — Encounter (HOSPITAL_COMMUNITY): Payer: Self-pay | Admitting: Internal Medicine

## 2021-01-18 LAB — BASIC METABOLIC PANEL
Anion gap: 8 (ref 5–15)
BUN: 50 mg/dL — ABNORMAL HIGH (ref 8–23)
CO2: 20 mmol/L — ABNORMAL LOW (ref 22–32)
Calcium: 9 mg/dL (ref 8.9–10.3)
Chloride: 111 mmol/L (ref 98–111)
Creatinine, Ser: 2.61 mg/dL — ABNORMAL HIGH (ref 0.44–1.00)
GFR, Estimated: 19 mL/min — ABNORMAL LOW (ref >60)
Glucose, Bld: 114 mg/dL — ABNORMAL HIGH (ref 70–99)
Potassium: 4.4 mmol/L (ref 3.5–5.1)
Sodium: 139 mmol/L (ref 135–145)

## 2021-01-18 LAB — MAGNESIUM: Magnesium: 1.7 mg/dL (ref 1.7–2.4)

## 2021-01-18 LAB — GLUCOSE, CAPILLARY
Glucose-Capillary: 109 mg/dL — ABNORMAL HIGH (ref 70–99)
Glucose-Capillary: 111 mg/dL — ABNORMAL HIGH (ref 70–99)
Glucose-Capillary: 115 mg/dL — ABNORMAL HIGH (ref 70–99)
Glucose-Capillary: 117 mg/dL — ABNORMAL HIGH (ref 70–99)
Glucose-Capillary: 119 mg/dL — ABNORMAL HIGH (ref 70–99)
Glucose-Capillary: 151 mg/dL — ABNORMAL HIGH (ref 70–99)
Glucose-Capillary: 188 mg/dL — ABNORMAL HIGH (ref 70–99)

## 2021-01-18 LAB — AMMONIA: Ammonia: 36 umol/L — ABNORMAL HIGH (ref 9–35)

## 2021-01-18 MED ORDER — DEXTROSE IN LACTATED RINGERS 5 % IV SOLN: INTRAVENOUS | Status: DC

## 2021-01-18 MED ORDER — SODIUM CHLORIDE 0.9 % IV SOLN
1.0000 g | INTRAVENOUS | Status: DC
  Administered 2021-01-18 – 2021-01-20 (×3): 1 g via INTRAVENOUS
  Filled 2021-01-18 (×3): qty 10

## 2021-01-18 MED ORDER — ADULT MULTIVITAMIN W/MINERALS CH
1.0000 | ORAL_TABLET | Freq: Every day | ORAL | Status: DC
  Administered 2021-01-18 – 2021-01-21 (×4): 1 via ORAL
  Filled 2021-01-18 (×4): qty 1

## 2021-01-18 MED ORDER — ENSURE ENLIVE PO LIQD
237.0000 mL | Freq: Two times a day (BID) | ORAL | Status: DC
  Administered 2021-01-19 – 2021-01-21 (×2): 237 mL via ORAL

## 2021-01-18 NOTE — Progress Notes (Addendum)
Initial Nutrition Assessment  DOCUMENTATION CODES:      INTERVENTION:  Recommend liberalize diet to least restrictive given her poor intake  Magic cup with lunch and dinner  Include finger foods each meal  Ensure Enlive po BID, each supplement provides 350 kcal and 20 grams of protein   Request current weight if possible- talked with nursing. Bed weight not working.   NUTRITION DIAGNOSIS:   Increased nutrient needs related to wound healing (bone healing) as evidenced by estimated needs.   GOAL:  Patient will meet greater than or equal to 90% of their needs (if feasible given pt dementia and if congruent with pt goals of care)  MONITOR:  PO intake,Supplement acceptance,Labs,Weight trends,Skin  REASON FOR ASSESSMENT:    Poor po intake    ASSESSMENT: Patient is a 70 yo female with hx of dementia, DM 2, HTN, HLD, COPD. Patient fell at home and fractured right distal femoral (right knee), right distal humeral fracture right elbow. No surgery planned. PT recommending SNF at discharge.    Patient has not been eating well for a while per family. Food preferences obtained from daughter and have been communicated to nutrition staff. Patient likes chicken tenders, chicken salad, fresh fruit, orange sherbet, PBJ and tea, juice or water to drink.    Talked with NT who has been attempted to feed pt the past 2 days. Meal intake 0%. Pt son was able to get her to take a few bites on Sunday. LOS day 10. Patient at high risk malnutrition due to her prolonged poor intake. Stage 2 pressure injury to right buttock.   Patient is more awake to today than during RD visit yesterday. Offered to provide a beverage for her but she refused multiple times. Recommend discussion with family regarding patient wishes for alternate nutrition source if she continues to refuse po's.  Significant weight loss the past month per family. Patient weighed 95.7 kg- 3/29/21and 103.4 kg- 05/19/19. A current weight would be  helpful to clarify her malnutrition and to assess significance of weight loss the past month.   Medications: insulin, aricept, senokot-s.   IVF-Lactated Ringers @ 75 ml/hr  Labs:  BMP Latest Ref Rng & Units 01/18/2021 01/17/2021 01/16/2021  Glucose 70 - 99 mg/dL 114(H) 119(H) 130(H)  BUN 8 - 23 mg/dL 50(H) 48(H) 47(H)  Creatinine 0.44 - 1.00 mg/dL 2.61(H) 2.64(H) 2.86(H)  Sodium 135 - 145 mmol/L 139 138 139  Potassium 3.5 - 5.1 mmol/L 4.4 4.5 4.3  Chloride 98 - 111 mmol/L 111 112(H) 112(H)  CO2 22 - 32 mmol/L 20(L) 18(L) 21(L)  Calcium 8.9 - 10.3 mg/dL 9.0 8.7(L) 8.7(L)     NUTRITION - FOCUSED PHYSICAL EXAM: Nutrition-Focused physical exam completed. Findings edema may be masking upper arm fat depletion, moderate clavicle, acromion/ deltoid muscle depletion, and mild upper and lower extremity edema.     Diet Order:   Diet Order            Diet regular Room service appropriate? Yes; Fluid consistency: Thin  Diet effective now                 EDUCATION NEEDS:  Not appropriate for education at this time  Skin:  Skin Assessment: Skin Integrity Issues: Skin Integrity Issues:: Stage II Stage II: right buttock  Last BM:  5/3  Height:   Ht Readings from Last 1 Encounters:  01/08/21 5\' 5"  (1.651 m)    Weight:   Wt Readings from Last 1 Encounters:  01/08/21 85.3 kg  Ideal Body Weight:   57 kg  BMI:  Body mass index is 31.28 kg/m.  Estimated Nutritional Needs:   Kcal:  1900-2100  Protein:  95-103 gr  Fluid:  1.9-2.1 liters daily   Colman Cater MS,RD,CSG,LDN Contact: [email or web address]

## 2021-01-18 NOTE — Progress Notes (Signed)
PROGRESS NOTE    [name]  ZHY:[id number] DOB: 07-30-1951 DOA: 01/08/2021 PCP: [name], MD   Brief Narrative:   70 y.o.femalewith who is a reformed smoker with medical history significant forHTN, HLD, T2DM, dementia and obesity, COPD admitted on 01/08/2021 after falling at home with right distal humerus/elbow and right distal femur/knee fracture, not amenable to surgical fixation, requiring prolonged rehab.Her creatinine levels are now elevating and she will be started on some IV fluid.  She is noted to have signs of UTI and has been started on Rocephin 5/3.  She continues to have ongoing confusion.  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   Right Distal Humeral Fracture/Rt Elbow Fracture-Not Amenable to Operative Fixation   AKI (acute kidney injury) ([place])   Hypokalemia   Fall at home, initial encounter   Hyperlipidemia   Type 2 diabetes mellitus without complication ([place])   Obesity (BMI 30.0-34.9)   Dementia without behavioral disturbance (HCC)   Hypomagnesemia   COPD (chronic obstructive pulmonary disease) (HCC)   Rt distal femur/knee fracture- -mobility remains limited due to right arm and right leg fracture, pain with repositioning  Continue as needed analgesics for optimal pain control -discussed with orthopedic surgeon Dr. Aline Brochure, who also discussed with orthopedic surgeon on-call at [facility]--- after reviewing imaging studies and further orthopedic consultation- -They advised rehab on conservative medical management -Apparently fracture location is Not amenable to operative fixation -Patient will have to be nonweightbearing for up to 90 days -Will need rehab and pain control -PT/OT eval -recommendedSNF, insurance has  denied the patient. I have reached out to [facility] for peer to peer review Dr. Floreen Comber [phone] option 3. Her policy # is [id number]. They believe the patient is total custodial and they do not cover long-term care  -PT continue to work with patient, she follows command,and instructions  Right distal humerus/elbow fracture- -immobile right upper extremity, -- as per orthopedic surgeon not amenable to operative fixation -Casting and conservative treatment with rehab advised -Not weightbearing on right upper extremity for 90 days -PT/OT eval-pending SNF  HTN-- -BPremained elevated, we will switch her atenolol to labetalol,and p.o. hydralazine --Continue amlodipine 10 mg daily (Initiating anxiolytics, Xanax and titrating pain medication for better pain control which may help with her blood pressure)  -BP improving after medication adjustments and with improved pain control  Stopped chlorthalidone due to persistent electrolyte abnormalities -Stop verapamil due to concerns for bradycardia with concomitant use of atenolol - Avoiding ACEI/ARB/ARNI due to renal concerns   DM2-recent A1c prior A1c was 8.7 reflecting uncontrolled diabetes with hyperglycemia PTA -At home patient was supposed to be taking [place] but had not been compliant lately -We will continue CBGs q. 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 Question if this is the new baseline? -Avoiding nephrotoxins, dehydration, hypotension -Stopped chlorthalidone due to persistent electrolyte derangement -Started IVF 5/1  Dementia with some cognitive and memory deficits- -currently on Aricept -May use lorazepam as needed agitation -Continues to have some ongoing lethargy and confusion with poor oral intake, continue IV fluid through today and await further  decision from insurance company   Recurrentfalls---PTA  pt lived alone and did very poorly,patient has significant limitations with mobility related ADLs-this patient needs to continue to be monitored in the hospital until a SNF bed is obtained as she is Not safe to go home with her current physcical limitations -right knee and right elbow fractures, patient is nonweightbearing at this time due to acute fractures - status post fall at home, No safe discharge plan at this time* -PT/OT eval recommending SNF rehab  tobacco abuse----smoking cessation advised, may use nicotine patch  recurrent urinary retention-Foley catheter in situ--- patient has fractures of the right lower and right upper extremities--nonweightbearing for at least 90 days -Patient having difficulty with purewick/bedbound/turning in bed -Given nonweightbearing status with acute fractures continue Foley catheter for now -Foley placed 01/10/2021, removed 01/12/2021 -will continue to monitor closely for urinary retention -Now with UTI and urine cultures pending; started on Rocephin empirically 5/3  Meals--patient is nonweightbearing on the right upper extremity patient needs total assistance with feeding  Chronic Anemia--- hemoglobin stable at this time -Patient has had chronic anemia for long time -H&H remained stable  Difficult placement:  Disposition/Need for in-Hospital Stay- patient unable to be discharged at this time due to ---  Progressive debility due to right knee right elbow fractures,  patient is nonweightbearing, status post fall at home, No safe discharge plan at this time-- awaiting insurance approval for transfer to SNF rehab  I have reached out to [facility] for peer to peer review  Called [phone] option 3. Her policy # is [id number]. Got a call back from [employer] Dr. Floreen Comber stating she is total care - needs a prolonged care and they will not cover the, per PT OT note needs total care cannot  follow instructions.... I have placed a reconsult note for PT/OT   DVT prophylaxis:Lovenox Code Status:Full Family Communication:Discussed with son on phone5/3 Disposition Plan: Status is: Inpatient  Remains inpatient appropriate because:Unsafe d/c plan, IV treatments appropriate due to intensity of illness or inability to take PO and Inpatient level of care appropriate due to severity of illness   Dispo: The patient is from:Home Anticipated d/c is to:SNF Patient currently is medically stable to d/c. Difficult to place patient Yes   Consultants:  Orthopedics  Procedures:  See below  Antimicrobials:  Anti-infectives (From admission, onward)   Start     Dose/Rate Route Frequency Ordered Stop   01/18/21 0800  cefTRIAXone (ROCEPHIN) 1 g in sodium chloride 0.9 % 100 mL IVPB        1 g 200 mL/hr over 30 Minutes Intravenous Every 24 hours 01/18/21 0703         Subjective: Patient seen and evaluated today, she continues to remain quite confused and does not eat very well.  Objective: Vitals:   01/16/21 2133 01/17/21 0501 01/17/21 1336 01/17/21 2133  BP: 111/74 (!) 129/91 (!) 158/67 (!) 178/80  Pulse: 64 63 64 (!) 59  Resp: 16 16 20 15   Temp: 98.4 F (36.9 C) 97.9 F (36.6 C) 97.8 F (36.6 C) 98.2 F (36.8 C)  TempSrc: Oral Oral Oral Oral  SpO2: 97% 100% 100% 100%  Weight:      Height:        Intake/Output Summary (Last 24 hours) at 01/18/2021 1211 Last data filed at 01/18/2021 0401 Gross per 24 hour  Intake --  Output 900 ml  Net -900 ml   Filed Weights   01/08/21 1546  Weight: 85.3 kg    Examination:  General exam: Appears calm and comfortable,  confused Respiratory system: Clear to auscultation. Respiratory effort normal. Cardiovascular system: S1 & S2 heard, RRR.  Gastrointestinal system: Abdomen is soft Central nervous system: Alert and awake Extremities: Right upper and lower extremities in  immobilizer Skin: No significant lesions noted Psychiatry: Flat affect.    Data Reviewed: I have personally reviewed following labs and imaging studies  CBC: Recent Labs  Lab 01/12/21 0635 01/13/21 0831 01/14/21 0519  WBC 7.7 8.0 9.2  HGB 9.2* 9.4* 11.1*  HCT 28.8* 30.1* 35.0*  MCV 96.3 95.6 94.9  PLT 208 251 638   Basic Metabolic Panel: Recent Labs  Lab 01/14/21 0519 01/15/21 0604 01/16/21 0436 01/17/21 0632 01/18/21 0636  NA 139 140 139 138 139  K 4.3 4.2 4.3 4.5 4.4  CL 111 113* 112* 112* 111  CO2 20* 20* 21* 18* 20*  GLUCOSE 98 122* 130* 119* 114*  BUN 38* 44* 47* 48* 50*  CREATININE 2.19* 2.44* 2.86* 2.64* 2.61*  CALCIUM 9.3 9.0 8.7* 8.7* 9.0  MG  --   --   --  1.9 1.7   GFR: Estimated Creatinine Clearance: 21.6 mL/min (A) (by C-G formula based on SCr of 2.61 mg/dL (H)). Liver Function Tests: No results for input(s): AST, ALT, ALKPHOS, BILITOT, PROT, ALBUMIN in the last 168 hours. No results for input(s): LIPASE, AMYLASE in the last 168 hours. No results for input(s): AMMONIA in the last 168 hours. Coagulation Profile: No results for input(s): INR, PROTIME in the last 168 hours. Cardiac Enzymes: No results for input(s): CKTOTAL, CKMB, CKMBINDEX, TROPONINI in the last 168 hours. BNP (last 3 results) No results for input(s): PROBNP in the last 8760 hours. HbA1C: No results for input(s): HGBA1C in the last 72 hours. CBG: Recent Labs  Lab 01/17/21 2131 01/18/21 0034 01/18/21 0449 01/18/21 0725 01/18/21 1102  GLUCAP 159* 119* 109* 111* 115*   Lipid Profile: No results for input(s): CHOL, HDL, LDLCALC, TRIG, CHOLHDL, LDLDIRECT in the last 72 hours. Thyroid Function Tests: No results for input(s): TSH, T4TOTAL, FREET4, T3FREE, THYROIDAB in the last 72 hours. Anemia Panel: No results for input(s): VITAMINB12, FOLATE, FERRITIN, TIBC, IRON, RETICCTPCT in the last 72 hours. Sepsis Labs: No results for input(s): PROCALCITON, LATICACIDVEN in the last 168  hours.  Recent Results (from the past 240 hour(s))  Resp Panel by RT-PCR (Flu A&B, Covid) Nasopharyngeal Swab     Status: None   Collection Time: 01/08/21  5:32 PM   Specimen: Nasopharyngeal Swab; Nasopharyngeal(NP) swabs in vial transport medium  Result Value Ref Range Status   SARS Coronavirus 2 by RT PCR NEGATIVE NEGATIVE Final    Comment: (NOTE) SARS-CoV-2 target nucleic acids are NOT DETECTED.  The SARS-CoV-2 RNA is generally detectable in upper respiratory specimens during the acute phase of infection. The lowest concentration of SARS-CoV-2 viral copies this assay can detect is 138 copies/mL. A negative result does not preclude SARS-Cov-2 infection and should not be used as the sole basis for treatment or other patient management decisions. A negative result may occur with  improper specimen collection/handling, submission of specimen other than nasopharyngeal swab, presence of viral mutation(s) within the areas targeted by this assay, and inadequate number of viral copies(<138 copies/mL). A negative result must be combined with clinical observations, patient history, and epidemiological information. The expected result is Negative.  Fact Sheet for Patients:  EntrepreneurPulse.com.au  Fact Sheet for Healthcare Providers:  IncredibleEmployment.be  This test is no t yet approved or cleared by the [place] and  has been authorized  for detection and/or diagnosis of SARS-CoV-2 by FDA under an Emergency Use Authorization (EUA). This EUA will remain  in effect (meaning this test can be used) for the duration of the COVID-19 declaration under Section 564(b)(1) of the Act, 21 U.S.C.section 360bbb-3(b)(1), unless the authorization is terminated  or revoked sooner.       Influenza A by PCR NEGATIVE NEGATIVE Final   Influenza B by PCR NEGATIVE NEGATIVE Final    Comment: (NOTE) The Xpert Xpress SARS-CoV-2/FLU/RSV plus assay is intended  as an aid in the diagnosis of influenza from Nasopharyngeal swab specimens and should not be used as a sole basis for treatment. Nasal washings and aspirates are unacceptable for Xpert Xpress SARS-CoV-2/FLU/RSV testing.  Fact Sheet for Patients: EntrepreneurPulse.com.au  Fact Sheet for Healthcare Providers: IncredibleEmployment.be  This test is not yet approved or cleared by the [place] FDA and has been authorized for detection and/or diagnosis of SARS-CoV-2 by FDA under an Emergency Use Authorization (EUA). This EUA will remain in effect (meaning this test can be used) for the duration of the COVID-19 declaration under Section 564(b)(1) of the Act, 21 U.S.C. section 360bbb-3(b)(1), unless the authorization is terminated or revoked.  Performed at [facility], [address], [place], [place] [address]          Radiology Studies: No results found.      Scheduled Meds: . Chlorhexidine Gluconate Cloth  6 each Topical Daily  . donepezil  5 mg Oral Daily  . enoxaparin (LOVENOX) injection  30 mg Subcutaneous Q24H  . insulin aspart  0-9 Units Subcutaneous Q4H  . nicotine  21 mg Transdermal Daily  . polyethylene glycol  17 g Oral Daily  . rosuvastatin  5 mg Oral Daily  . senna-docusate  2 tablet Oral QHS   Continuous Infusions: . cefTRIAXone (ROCEPHIN)  IV 1 g (01/18/21 0941)  . lactated ringers 75 mL/hr at 01/18/21 0524     LOS: 10 days    Time spent: 35 minutes    Jaterrius Ricketson D Manuella Ghazi, DO [facility]  If 7PM-7AM, please contact night-coverage [email or web address] 01/18/2021, 12:11 PM

## 2021-01-19 ENCOUNTER — Inpatient Hospital Stay (HOSPITAL_COMMUNITY): Payer: Medicare HMO

## 2021-01-19 LAB — BASIC METABOLIC PANEL
Anion gap: 7 (ref 5–15)
BUN: 48 mg/dL — ABNORMAL HIGH (ref 8–23)
CO2: 21 mmol/L — ABNORMAL LOW (ref 22–32)
Calcium: 9.2 mg/dL (ref 8.9–10.3)
Chloride: 112 mmol/L — ABNORMAL HIGH (ref 98–111)
Creatinine, Ser: 2.28 mg/dL — ABNORMAL HIGH (ref 0.44–1.00)
GFR, Estimated: 23 mL/min — ABNORMAL LOW (ref >60)
Glucose, Bld: 132 mg/dL — ABNORMAL HIGH (ref 70–99)
Potassium: 4 mmol/L (ref 3.5–5.1)
Sodium: 140 mmol/L (ref 135–145)

## 2021-01-19 LAB — GLUCOSE, CAPILLARY
Glucose-Capillary: 107 mg/dL — ABNORMAL HIGH (ref 70–99)
Glucose-Capillary: 116 mg/dL — ABNORMAL HIGH (ref 70–99)
Glucose-Capillary: 117 mg/dL — ABNORMAL HIGH (ref 70–99)
Glucose-Capillary: 145 mg/dL — ABNORMAL HIGH (ref 70–99)
Glucose-Capillary: 151 mg/dL — ABNORMAL HIGH (ref 70–99)
Glucose-Capillary: 237 mg/dL — ABNORMAL HIGH (ref 70–99)

## 2021-01-19 LAB — URINE CULTURE

## 2021-01-19 LAB — CBC
HCT: 31.5 % — ABNORMAL LOW (ref 36.0–46.0)
Hemoglobin: 10 g/dL — ABNORMAL LOW (ref 12.0–15.0)
MCH: 30.4 pg (ref 26.0–34.0)
MCHC: 31.7 g/dL (ref 30.0–36.0)
MCV: 95.7 fL (ref 80.0–100.0)
Platelets: 308 10*3/uL (ref 150–400)
RBC: 3.29 MIL/uL — ABNORMAL LOW (ref 3.87–5.11)
RDW: 13.2 % (ref 11.5–15.5)
WBC: 9.4 10*3/uL (ref 4.0–10.5)
nRBC: 0 % (ref 0.0–0.2)

## 2021-01-19 LAB — MAGNESIUM: Magnesium: 1.6 mg/dL — ABNORMAL LOW (ref 1.7–2.4)

## 2021-01-19 MED ORDER — LACTULOSE 10 GM/15ML PO SOLN
10.0000 g | Freq: Two times a day (BID) | ORAL | Status: DC
  Administered 2021-01-19 – 2021-01-20 (×2): 10 g via ORAL
  Filled 2021-01-19 (×2): qty 30

## 2021-01-19 MED ORDER — MAGNESIUM SULFATE 2 GM/50ML IV SOLN
2.0000 g | Freq: Once | INTRAVENOUS | Status: AC
  Administered 2021-01-19: 2 g via INTRAVENOUS
  Filled 2021-01-19: qty 50

## 2021-01-19 MED ORDER — LACTULOSE 10 GM/15ML PO SOLN: 10.0000 g | Freq: Every day | ORAL | Status: DC

## 2021-01-19 NOTE — TOC Progression Note (Addendum)
Transition of Care [facility]) - Progression Note    Patient Details  Name: [name] MRN: [id number] Date of Birth: [birth year]  Transition of [facility]) CM/SW Contact  Salome Arnt, [place] Phone Number: 01/19/2021, 1:18 PM  Clinical Narrative: LCSW spoke with [employer] this morning and notes weren't clear in their system about status of appeal. LCSW noted insurance different in chart today and discussed with Kerri at [facility] who verified that pt's [employer] termed on 5/1 and pt now has [facility] Medicare. TOC will start Navi authorization when appropriate. Pt not medically stable and continues to have poor oral intake. LCSW updated pt's son, [name]. He was not aware that pt changed insurance. [name] reports he applied for Medicaid today for pt. Hannah Marker, NP with [facility] and [place] [phone]) called LCSW and states she has been following pt at home for outpatient palliative. She reports pt was DNR at home and does not believe pt would want feeding tube based on conversations they have had in the weeks prior to admission. [name] plans to call son to discuss further. MD notified. TOC will continue to follow.     Update: Pt's son called LCSW and states he has spoken with [name]. He confirms pt wants to be DNR and she would not want NG or feeding tube. However, he indicates he has brought food for pt and she will eat for him. He is aware to let RN know how much pt is eating at meals when he is here. MD updated.     Expected Discharge Plan: [place] Barriers to Discharge: Continued Medical Work up  Expected Discharge Plan and Services Expected Discharge Plan: [place] arrangements for the past 2 months: Single Family Home                                       Social Determinants of Health (SDOH) Interventions    Readmission Risk Interventions Readmission Risk Prevention Plan 01/11/2021  Medication Screening  Complete  Transportation Screening Complete  Some recent data might be hidden

## 2021-01-19 NOTE — Progress Notes (Signed)
Nutrition Brief Note  RD consulted: Calorie Count. Start with dinner this evening.   Talked with nursing and nutrition services.  Envelop placed on door. Will gather meal intake data 48 hr.  Results to follow completion.  Colman Cater MS,RD,CSG,LDN Contact: [email or web address]

## 2021-01-19 NOTE — Progress Notes (Signed)
PROGRESS NOTE    [name]  WSF:[id number] DOB: 1950-09-24 DOA: 01/08/2021 PCP: Hannah Beard, MD   Brief Narrative:   70 y.o.femalewith who is a reformed smoker with medical history significant forHTN, HLD, T2DM, dementia and obesity, COPD admitted on 01/08/2021 after falling at home with right distal humerus/elbow and right distal femur/knee fracture, not amenable to surgical fixation, requiring prolonged rehab.Her creatinine levels are now elevating and she will be started on some IV fluid.  She is noted to have signs of UTI and has been started on Rocephin 5/3.  She continues to have ongoing confusion.  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   Right Distal Humeral Fracture/Rt Elbow Fracture-Not Amenable to Operative Fixation   AKI (acute kidney injury) ([place])   Hypokalemia   Fall at home, initial encounter   Hyperlipidemia   Type 2 diabetes mellitus without complication ([place])   Obesity (BMI 30.0-34.9)   Dementia without behavioral disturbance (HCC)   Hypomagnesemia   COPD (chronic obstructive pulmonary disease) (HCC)   Dementia with some cognitive and memory deficits- with acute encephalopathy ongoing -currently on Aricept -May use lorazepam as needed agitation -Continues to have some ongoing lethargy and confusion with poor oral intake, continue IV fluid through today; pt and family do not want feeding tube -CT head on 5/4 unremarkable -Start lactulose for elevated ammonia 5/4 -Start calorie count 5/4 due to concerns of inadequate intake  Rt distal femur/knee fracture- -mobility remains limited due to right arm and right leg fracture, pain with repositioning  Continue as needed analgesics for optimal pain control -discussed with  orthopedic surgeon Dr. Aline Brochure, who also discussed with orthopedic surgeon on-call at [facility]--- after reviewing imaging studies and further orthopedic consultation- -They advised rehab on conservative medical management -Apparently fracture location is Not amenable to operative fixation -Patient will have to be nonweightbearing for up to 90 days -Will need rehab and pain control vs outpatient palliative  Right distal humerus/elbow fracture- -immobile right upper extremity, -- as per orthopedic surgeon not amenable to operative fixation -Casting and conservative treatment with rehab advised -Not weightbearing on right upper extremity for 90 days -PT/OT eval-pending SNF   HTN-- -BPremained elevated, we will switch her atenolol to labetalol,and p.o. hydralazine --Continue amlodipine 10 mg daily (Initiating anxiolytics, Xanax and titrating pain medication for better pain control which may help with her blood pressure)  -BP improving after medication adjustments and with improved pain control  Stopped chlorthalidone due to persistent electrolyte abnormalities -Stop verapamil due to concerns for bradycardia with concomitant use of atenolol - Avoiding ACEI/ARB/ARNI due to renal concerns   DM2-recent A1c prior A1c was 8.7 reflecting uncontrolled diabetes with hyperglycemia PTA -At home patient was supposed to be taking [place] but had not been compliant lately -We will continue CBGs q. 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 Question if this is the new baseline? -Avoiding nephrotoxins, dehydration, hypotension -Stopped chlorthalidone due to persistent electrolyte derangement -Started IVF 5/1  Recurrentfalls---PTA pt lived alone and did very poorly,patient has significant limitations with mobility related ADLs-this  patient needs to continue to be monitored in the hospital until a  SNF bed is obtained as she is Not safe to go home with her current physcical limitations -right knee and right elbow fractures, patient is nonweightbearing at this time due to acute fractures - status post fall at home, No safe discharge plan at this time* -PT/OT eval recommending SNF rehab, but questionable if she will be able to go  tobacco abuse----smoking cessation advised, may use nicotine patch  recurrent urinary retention-Foley catheter in situ--- patient has fractures of the right lower and right upper extremities--nonweightbearing for at least 90 days -Patient having difficulty with purewick/bedbound/turning in bed -Given nonweightbearing status with acute fractures continue Foley catheter for now -Foley placed 01/10/2021, removed 01/12/2021 -will continue to monitor closely for urinary retention -Now with UTI and urine cultures pending; started on Rocephin empirically 5/3  Meals--patient is nonweightbearing on the right upper extremity patient needs total assistance with feeding  Chronic Anemia--- hemoglobin stable at this time -Patient has had chronic anemia for long time -H&H remained stable   DVT prophylaxis:Lovenox Code Status:DNR Family Communication:Discussed with son on phone5/4 Disposition Plan: Status is: Inpatient  Remains inpatient appropriate because:Unsafe d/c plan, IV treatments appropriate due to intensity of illness or inability to take PO and Inpatient level of care appropriate due to severity of illness   Dispo: The patient is from:Home Anticipated d/c is to:SNF vs home hospice Patient currently is medically stable to d/c. Difficult to place patient Yes   Consultants:  Orthopedics  Procedures:  See below  Antimicrobials:  Anti-infectives (From admission, onward)   Start     Dose/Rate Route Frequency Ordered Stop    01/18/21 0800  cefTRIAXone (ROCEPHIN) 1 g in sodium chloride 0.9 % 100 mL IVPB        1 g 200 mL/hr over 30 Minutes Intravenous Every 24 hours 01/18/21 0703        Subjective: Patient seen and evaluated today with ongoing confusion noted; no acute overnight events noted. She continues not to eat very much.  Objective: Vitals:   01/18/21 1944 01/18/21 2019 01/19/21 0417 01/19/21 1348  BP:  (!) 144/93 (!) 144/108 134/70  Pulse:  63 65 (!) 55  Resp:  20 20 15   Temp:  (!) 97.5 F (36.4 C) 98 F (36.7 C) 98.9 F (37.2 C)  TempSrc:      SpO2: 99% 99% 97% 100%  Weight:      Height:        Intake/Output Summary (Last 24 hours) at 01/19/2021 1405 Last data filed at 01/19/2021 1129 Gross per 24 hour  Intake 162.54 ml  Output 800 ml  Net -637.46 ml   Filed Weights   01/08/21 1546  Weight: 85.3 kg    Examination:  General exam: Appears calm and comfortable, confused/disoriented Respiratory system: Clear to auscultation. Respiratory effort normal. Cardiovascular system: S1 & S2 heard, RRR.  Gastrointestinal system: Abdomen is soft Central nervous system: Somnolent, but arousable Extremities: R UE and LE immobilized Skin: No significant lesions noted Psychiatry: Flat affect.    Data Reviewed: I have personally reviewed following labs and imaging studies  CBC: Recent Labs  Lab 01/13/21 0831 01/14/21 0519 01/19/21 0628  WBC 8.0 9.2 9.4  HGB 9.4* 11.1* 10.0*  HCT 30.1* 35.0* 31.5*  MCV 95.6 94.9 95.7  PLT 251 292 115   Basic Metabolic Panel: Recent Labs  Lab 01/15/21 0604 01/16/21 0436 01/17/21 0632 01/18/21 0636 01/19/21 0628  NA 140 139 138 139 140  K 4.2 4.3 4.5 4.4 4.0  CL 113* 112* 112*  111 112*  CO2 20* 21* 18* 20* 21*  GLUCOSE 122* 130* 119* 114* 132*  BUN 44* 47* 48* 50* 48*  CREATININE 2.44* 2.86* 2.64* 2.61* 2.28*  CALCIUM 9.0 8.7* 8.7* 9.0 9.2  MG  --   --  1.9 1.7 1.6*   GFR: Estimated Creatinine Clearance: 24.8 mL/min (A) (by C-G formula  based on SCr of 2.28 mg/dL (H)). Liver Function Tests: No results for input(s): AST, ALT, ALKPHOS, BILITOT, PROT, ALBUMIN in the last 168 hours. No results for input(s): LIPASE, AMYLASE in the last 168 hours. Recent Labs  Lab 01/18/21 1313  AMMONIA 36*   Coagulation Profile: No results for input(s): INR, PROTIME in the last 168 hours. Cardiac Enzymes: No results for input(s): CKTOTAL, CKMB, CKMBINDEX, TROPONINI in the last 168 hours. BNP (last 3 results) No results for input(s): PROBNP in the last 8760 hours. HbA1C: No results for input(s): HGBA1C in the last 72 hours. CBG: Recent Labs  Lab 01/18/21 2018 01/18/21 2352 01/19/21 0419 01/19/21 0730 01/19/21 1125  GLUCAP 151* 117* 145* 117* 116*   Lipid Profile: No results for input(s): CHOL, HDL, LDLCALC, TRIG, CHOLHDL, LDLDIRECT in the last 72 hours. Thyroid Function Tests: No results for input(s): TSH, T4TOTAL, FREET4, T3FREE, THYROIDAB in the last 72 hours. Anemia Panel: No results for input(s): VITAMINB12, FOLATE, FERRITIN, TIBC, IRON, RETICCTPCT in the last 72 hours. Sepsis Labs: No results for input(s): PROCALCITON, LATICACIDVEN in the last 168 hours.  Recent Results (from the past 240 hour(s))  Culture, Urine     Status: Abnormal   Collection Time: 01/17/21  6:04 PM   Specimen: Urine, Catheterized  Result Value Ref Range Status   Specimen Description   Final    URINE, CATHETERIZED Performed at [facility], [address] [place], [place] [address]    Special Requests   Final    NONE Performed at [facility], [address], [place], [place] [address]    Culture MULTIPLE SPECIES PRESENT, SUGGEST RECOLLECTION (A)  Final   Report Status 01/19/2021 FINAL  Final         Radiology Studies: CT HEAD WO CONTRAST  Result Date: 01/19/2021 CLINICAL DATA:  Mental status change, unknown cause. Additional history provided: Sudden onset altered mental status, recent injury to arm. EXAM: CT HEAD WITHOUT CONTRAST  TECHNIQUE: Contiguous axial images were obtained from the base of the skull through the vertex without intravenous contrast. COMPARISON:  Prior head CT examinations 01/08/2021. FINDINGS: Brain: Mild motion degradation. Mild cerebral atrophy. Moderate patchy and ill-defined hypoattenuation within the cerebral white matter, nonspecific but compatible with chronic small vessel ischemic disease. There is no acute intracranial hemorrhage. No demarcated cortical infarct. No extra-axial fluid collection. No evidence of intracranial mass. No midline shift. Vascular: No hyperdense vessel.  Atherosclerotic calcifications. Skull: Normal. Negative for fracture or focal lesion. Sinuses/Orbits: Visualized orbits show no acute finding. Mild bilateral ethmoid sinus mucosal thickening. Small right sphenoid sinus mucous retention cyst. Other: Large bilateral mastoid effusions. IMPRESSION: Mildly motion degraded examination. No evidence of acute intracranial abnormality. Mild cerebral atrophy and moderate cerebral white matter chronic small vessel ischemic disease, stable as compared to the head CT of 01/08/2021. Electronically Signed   By: Kellie Simmering DO   On: 01/19/2021 13:54        Scheduled Meds: . Chlorhexidine Gluconate Cloth  6 each Topical Daily  . donepezil  5 mg Oral Daily  . enoxaparin (LOVENOX) injection  30 mg Subcutaneous Q24H  . feeding supplement  237 mL Oral  BID BM  . insulin aspart  0-9 Units Subcutaneous Q4H  . lactulose  10 g Oral BID  . multivitamin with minerals  1 tablet Oral Daily  . nicotine  21 mg Transdermal Daily  . polyethylene glycol  17 g Oral Daily  . rosuvastatin  5 mg Oral Daily  . senna-docusate  2 tablet Oral QHS   Continuous Infusions: . cefTRIAXone (ROCEPHIN)  IV 1 g (01/19/21 0835)  . dextrose 5% lactated ringers 75 mL/hr at 01/18/21 1757     LOS: 11 days    Time spent: 35 minutes    Carston Riedl D Manuella Ghazi, DO [facility]  If 7PM-7AM, please contact  night-coverage [email or web address] 01/19/2021, 2:05 PM

## 2021-01-20 LAB — BASIC METABOLIC PANEL
Anion gap: 8 (ref 5–15)
BUN: 49 mg/dL — ABNORMAL HIGH (ref 8–23)
CO2: 23 mmol/L (ref 22–32)
Calcium: 9.3 mg/dL (ref 8.9–10.3)
Chloride: 109 mmol/L (ref 98–111)
Creatinine, Ser: 2.37 mg/dL — ABNORMAL HIGH (ref 0.44–1.00)
GFR, Estimated: 22 mL/min — ABNORMAL LOW (ref >60)
Glucose, Bld: 103 mg/dL — ABNORMAL HIGH (ref 70–99)
Potassium: 4.6 mmol/L (ref 3.5–5.1)
Sodium: 140 mmol/L (ref 135–145)

## 2021-01-20 LAB — GLUCOSE, CAPILLARY
Glucose-Capillary: 113 mg/dL — ABNORMAL HIGH (ref 70–99)
Glucose-Capillary: 113 mg/dL — ABNORMAL HIGH (ref 70–99)
Glucose-Capillary: 121 mg/dL — ABNORMAL HIGH (ref 70–99)
Glucose-Capillary: 149 mg/dL — ABNORMAL HIGH (ref 70–99)
Glucose-Capillary: 174 mg/dL — ABNORMAL HIGH (ref 70–99)
Glucose-Capillary: 60 mg/dL — ABNORMAL LOW (ref 70–99)
Glucose-Capillary: 67 mg/dL — ABNORMAL LOW (ref 70–99)
Glucose-Capillary: 84 mg/dL (ref 70–99)

## 2021-01-20 LAB — MAGNESIUM: Magnesium: 2 mg/dL (ref 1.7–2.4)

## 2021-01-20 LAB — AMMONIA: Ammonia: 10 umol/L (ref 9–35)

## 2021-01-20 MED ORDER — INSULIN ASPART 100 UNIT/ML IJ SOLN
0.0000 [IU] | Freq: Three times a day (TID) | INTRAMUSCULAR | Status: DC
  Administered 2021-01-20 – 2021-01-21 (×2): 1 [IU] via SUBCUTANEOUS

## 2021-01-20 MED ORDER — INSULIN ASPART 100 UNIT/ML IJ SOLN: 0.0000 [IU] | Freq: Every day | INTRAMUSCULAR | Status: DC

## 2021-01-20 NOTE — Progress Notes (Signed)
PROGRESS NOTE    [name]  ERX:[id number] DOB: 09-04-1951 DOA: 01/08/2021 PCP: [name], MD   Brief Narrative:   70 y.o.femalewith who is a reformed smoker with medical history significant forHTN, HLD, T2DM, dementia and obesity, COPD admitted on 01/08/2021 after falling at home with right distal humerus/elbow and right distal femur/knee fracture, not amenable to surgical fixation, requiring prolonged rehab.Her creatinine levels are now elevating and she will be started on some IV fluid.She is noted to have signs of UTI and has been started on Rocephin 5/3. She continues to have ongoing confusion.  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   Right Distal Humeral Fracture/Rt Elbow Fracture-Not Amenable to Operative Fixation   AKI (acute kidney injury) ([place])   Hypokalemia   Fall at home, initial encounter   Hyperlipidemia   Type 2 diabetes mellitus without complication ([place])   Obesity (BMI 30.0-34.9)   Dementia without behavioral disturbance (HCC)   Hypomagnesemia   COPD (chronic obstructive pulmonary disease) (HCC)   Dementia with some cognitive and memory deficits- with acute encephalopathy, now improving -currently on Aricept -May use lorazepam as needed agitation -Continues to have some ongoing lethargy and confusion with poor oral intake, continue IV fluid through today; pt and family do not want feeding tube -CT head on 5/4 unremarkable -Start lactulose for elevated ammonia 5/4, now resolved -Started calorie count 5/4 due to concerns of inadequate intake  Rt distal femur/knee fracture- -mobility remains limited due to right arm and right leg fracture, pain with repositioning  Continue as needed analgesics for optimal pain  control -discussed with orthopedic surgeon Dr. Aline Brochure, who also discussed with orthopedic surgeon on-call at [facility]--- after reviewing imaging studies and further orthopedic consultation- -They advised rehab on conservative medical management -Apparently fracture location is Not amenable to operative fixation -Patient will have to be nonweightbearing for up to 90 days -Will need rehab and pain control vs outpatient palliative  Right distal humerus/elbow fracture- -immobile right upper extremity, -- as per orthopedic surgeon not amenable to operative fixation -Casting and conservative treatment with rehab advised -Not weightbearing on right upper extremity for 90 days -PT/OT eval-pending SNF   HTN-- -BPremained elevated, we will switch her atenolol to labetalol,and p.o. hydralazine --Continue amlodipine 10 mg daily (Initiating anxiolytics, Xanax and titrating pain medication for better pain control which may help with her blood pressure)  -BP improving after medication adjustments and with improved pain control  Stopped chlorthalidone due to persistent electrolyte abnormalities -Stop verapamil due to concerns for bradycardia with concomitant use of atenolol - Avoiding ACEI/ARB/ARNI due to renal concerns   DM2-recent A1c prior A1c was 8.7 reflecting uncontrolled diabetes with hyperglycemia PTA -At home patient was supposed to be taking [place] but had not been compliant lately -We will continue CBGs q. 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 Question if this is the new baseline? -Avoiding nephrotoxins, dehydration, hypotension -Stopped chlorthalidone due to persistent electrolyte derangement -Started IVF 5/1  Recurrentfalls---PTA pt lived alone and did very poorly,patient has significant limitations with  mobility related ADLs-this patient needs to continue to be monitored in the hospital until a  SNF bed is obtained as she is Not safe to go home with her current physcical limitations -right knee and right elbow fractures, patient is nonweightbearing at this time due to acute fractures - status post fall at home, No safe discharge plan at this time* -PT/OT eval recommending SNF rehab, but questionable if she will be able to go  tobacco abuse----smoking cessation advised, may use nicotine patch  recurrent urinary retention-Foley catheter in situ--- patient has fractures of the right lower and right upper extremities--nonweightbearing for at least 90 days -Patient having difficulty with purewick/bedbound/turning in bed -Given nonweightbearing status with acute fractures continue Foley catheter for now -Foley placed 01/10/2021, removed 01/12/2021 -will continue to monitor closely for urinary retention -Now with UTI and urine cultures pending; started on Rocephin empirically 5/3-5/5  Meals--patient is nonweightbearing on the right upper extremity patient needs total assistance with feeding  Chronic Anemia--- hemoglobin stable at this time -Patient has had chronic anemia for long time -H&H remained stable   DVT prophylaxis:Lovenox Code Status:DNR Family Communication:Discussed with son on phone5/4 Disposition Plan: Status is: Inpatient  Remains inpatient appropriate because:Unsafe d/c plan, IV treatments appropriate due to intensity of illness or inability to take PO and Inpatient level of care appropriate due to severity of illness   Dispo: The patient is from:Home Anticipated d/c is to:SNF Patient currently is medically stable to d/c. Difficult to place patient Yes   Consultants:  Orthopedics  Procedures:  See below  Antimicrobials:  Anti-infectives (From admission, onward)   Start     Dose/Rate Route Frequency  Ordered Stop   01/18/21 0800  cefTRIAXone (ROCEPHIN) 1 g in sodium chloride 0.9 % 100 mL IVPB  Status:  Discontinued        1 g 200 mL/hr over 30 Minutes Intravenous Every 24 hours 01/18/21 0703 01/20/21 1100       Subjective: Patient seen and evaluated today with no new acute complaints or concerns. No acute concerns or events noted overnight. She is more awake and alert this am.  Objective: Vitals:   01/19/21 1348 01/19/21 2134 01/20/21 0420 01/20/21 0422  BP: 134/70 (!) 195/78 130/75 (!) 175/83  Pulse: (!) 55 62 69 66  Resp: 15 18 20    Temp: 98.9 F (37.2 C) 98.5 F (36.9 C) 98 F (36.7 C)   TempSrc:  Oral    SpO2: 100% 100% 92% 100%  Weight:      Height:        Intake/Output Summary (Last 24 hours) at 01/20/2021 1101 Last data filed at 01/19/2021 2200 Gross per 24 hour  Intake 1603.83 ml  Output 1000 ml  Net 603.83 ml   Filed Weights   01/08/21 1546  Weight: 85.3 kg    Examination:  General exam: Appears calm and comfortable, slightly confused Respiratory system: Clear to auscultation. Respiratory effort normal. Cardiovascular system: S1 & S2 heard, RRR.  Gastrointestinal system: Abdomen is soft Central nervous system: Alert and awake Extremities: No edema; RUE out of sling; RLE immobilized Skin: No significant lesions noted Psychiatry: Appears borderline manic    Data Reviewed: I have personally reviewed following labs and imaging studies  CBC: Recent Labs  Lab 01/14/21 0519 01/19/21 0628  WBC 9.2 9.4  HGB 11.1* 10.0*  HCT 35.0* 31.5*  MCV 94.9 95.7  PLT 292 194   Basic Metabolic Panel: Recent Labs  Lab 01/16/21 0436 01/17/21 0632 01/18/21 0636 01/19/21 0628 01/20/21 0711  NA 139 138 139 140 140  K 4.3 4.5 4.4 4.0 4.6  CL 112* 112* 111 112* 109  CO2 21* 18* 20* 21* 23  GLUCOSE 130* 119* 114* 132* 103*  BUN 47* 48* 50* 48* 49*  CREATININE 2.86* 2.64* 2.61* 2.28* 2.37*  CALCIUM 8.7* 8.7* 9.0 9.2 9.3  MG  --  1.9 1.7 1.6* 2.0    GFR: Estimated Creatinine Clearance: 23.8 mL/min (A) (by C-G formula based on SCr of 2.37 mg/dL (H)). Liver Function Tests: No results for input(s): AST, ALT, ALKPHOS, BILITOT, PROT, ALBUMIN in the last 168 hours. No results for input(s): LIPASE, AMYLASE in the last 168 hours. Recent Labs  Lab 01/18/21 1313 01/20/21 0852  AMMONIA 36* 10   Coagulation Profile: No results for input(s): INR, PROTIME in the last 168 hours. Cardiac Enzymes: No results for input(s): CKTOTAL, CKMB, CKMBINDEX, TROPONINI in the last 168 hours. BNP (last 3 results) No results for input(s): PROBNP in the last 8760 hours. HbA1C: No results for input(s): HGBA1C in the last 72 hours. CBG: Recent Labs  Lab 01/19/21 2354 01/20/21 0415 01/20/21 0425 01/20/21 0511 01/20/21 0735  GLUCAP 151* 60* 67* 84 113*   Lipid Profile: No results for input(s): CHOL, HDL, LDLCALC, TRIG, CHOLHDL, LDLDIRECT in the last 72 hours. Thyroid Function Tests: No results for input(s): TSH, T4TOTAL, FREET4, T3FREE, THYROIDAB in the last 72 hours. Anemia Panel: No results for input(s): VITAMINB12, FOLATE, FERRITIN, TIBC, IRON, RETICCTPCT in the last 72 hours. Sepsis Labs: No results for input(s): PROCALCITON, LATICACIDVEN in the last 168 hours.  Recent Results (from the past 240 hour(s))  Culture, Urine     Status: Abnormal   Collection Time: 01/17/21  6:04 PM   Specimen: Urine, Catheterized  Result Value Ref Range Status   Specimen Description   Final    URINE, CATHETERIZED Performed at [facility], [address], [place], [place] [address]    Special Requests   Final    NONE Performed at [facility], [address], [place], [place] [address]    Culture MULTIPLE SPECIES PRESENT, SUGGEST RECOLLECTION (A)  Final   Report Status 01/19/2021 FINAL  Final         Radiology Studies: CT HEAD WO CONTRAST  Result Date: 01/19/2021 CLINICAL DATA:  Mental status change, unknown cause. Additional history provided: Sudden  onset altered mental status, recent injury to arm. EXAM: CT HEAD WITHOUT CONTRAST TECHNIQUE: Contiguous axial images were obtained from the base of the skull through the vertex without intravenous contrast. COMPARISON:  Prior head CT examinations 01/08/2021. FINDINGS: Brain: Mild motion degradation. Mild cerebral atrophy. Moderate patchy and ill-defined hypoattenuation within the cerebral white matter, nonspecific but compatible with chronic small vessel ischemic disease. There is no acute intracranial hemorrhage. No demarcated cortical infarct. No extra-axial fluid collection. No evidence of intracranial mass. No midline shift. Vascular: No hyperdense vessel.  Atherosclerotic calcifications. Skull: Normal. Negative for fracture or focal lesion. Sinuses/Orbits: Visualized orbits show no acute finding. Mild bilateral ethmoid sinus mucosal thickening. Small right sphenoid sinus mucous retention cyst. Other: Large bilateral mastoid effusions. IMPRESSION: Mildly motion degraded examination. No evidence of acute intracranial abnormality. Mild cerebral atrophy and moderate cerebral white matter chronic small vessel ischemic disease, stable as compared to the head CT of 01/08/2021. Electronically Signed   By: Kellie Simmering DO   On: 01/19/2021 13:54        Scheduled Meds: . Chlorhexidine Gluconate Cloth  6 each Topical Daily  . donepezil  5 mg Oral Daily  . enoxaparin (LOVENOX) injection  30 mg Subcutaneous Q24H  . feeding  supplement  237 mL Oral BID BM  . insulin aspart  0-9 Units Subcutaneous Q4H  . multivitamin with minerals  1 tablet Oral Daily  . nicotine  21 mg Transdermal Daily  . polyethylene glycol  17 g Oral Daily  . rosuvastatin  5 mg Oral Daily  . senna-docusate  2 tablet Oral QHS    LOS: 12 days    Time spent: 35 minutes    Lavone Barrientes Darleen Crocker, DO [facility]  If 7PM-7AM, please contact night-coverage [email or web address] 01/20/2021, 11:01 AM

## 2021-01-20 NOTE — Progress Notes (Signed)
Inpatient Diabetes Program Recommendations  AACE/ADA: New Consensus Statement on Inpatient Glycemic Control (2015)  Target Ranges:  Prepandial:   less than 140 mg/dL      Peak postprandial:   less than 180 mg/dL (1-2 hours)      Critically ill patients:  140 - 180 mg/dL   Lab Results  Component Value Date   GLUCAP 113 (H) 01/20/2021   HGBA1C 6.3 (H) 01/08/2021    Review of Glycemic Control Results for CHARLIE, CHAR (MRN [id number]) as of 01/20/2021 13:33  Ref. Range 01/19/2021 11:25 01/19/2021 16:45 01/19/2021 21:27 01/19/2021 23:54 01/20/2021 04:15 01/20/2021 04:25 01/20/2021 05:11 01/20/2021 07:35 01/20/2021 11:39  Glucose-Capillary Latest Ref Range: 70 - 99 mg/dL 116 (H) 107 (H) 237 (H) 151 (H) 60 (L) 67 (L) 84 113 (H) 113 (H)   Diabetes history: DM 2 Outpatient Diabetes medications:  Tresiba 18 units q HS Current orders for Inpatient glycemic control:  Novolog sensitive q 4 hours Inpatient Diabetes Program Recommendations:    Consider reducing Novolog correction to tid with meals and HS (mild low this AM).   Thanks  Adah Perl, RN, BC-ADM Inpatient Diabetes Coordinator Pager [phone] (8a-5p)

## 2021-01-20 NOTE — Progress Notes (Signed)
Calorie Count Note  48 hour calorie count ordered.  Case discussed with RN, who reports pt is eating minimally.   Per chart review, pt and family would not desire artificial means of nutrition/ hydration .  Diet: regular Supplements: Magic cup BID with meals, each supplement provides 290 kcal and 9 grams of protein; Ensure Enlive po BID, each supplement provides 350 kcal and 20 grams of protein  Day 1 Breakfast: 194 kcals, 8 grams protein Lunch: Not attempted yet Dinner: 219 kcals, 3 grams protein  Total intake: 413 kcal (22% of minimum estimated needs)  11 grams protein (12% of minimum estimated needs)  Nutrition Dx: Increased nutrient needs related to wound healing (bone healing) as evidenced by estimated needs; ongoing  Goal: Patient will meet greater than or equal to 90% of their needs (if feasible given pt dementia and if congruent with pt goals of care); unmet  Intervention:   -Continue 48 hour calorie count -Continue Magic cup BID with meals, each supplement provides 290 kcal and 9 grams of protein -Continue Ensure Enlive po BID, each supplement provides 350 kcal and 20 grams of protein  Loistine Chance, RD, LDN, [place] Registered Dietitian II Certified Diabetes Care and Education Specialist Please refer to [facility] for RD and/or RD on-call/weekend/after hours pager

## 2021-01-20 NOTE — Progress Notes (Signed)
Physical Therapy Treatment Patient Details Name: [name] MRN: [id number] DOB: 07-31-1951 Today's Date: 01/20/2021    History of Present Illness HPI: [name] is a 70 y.o. female with medical history significant for hypertension, hyperlipidemia, T2DM, dementia and obesity who presents to the emergency department after sustaining a fall at home prior to arrival.  History was obtained from ED physician and son at bedside.  Per son, patient lives alone, but son checks in on her regularly, he took away her car keys due to dementia, a neighbor saw her fall while trying to get into locked car and landed on her right side.  She complained of right arm, right knee and right hip pain.  She was unable to get up after the fall, EMS was activated and patient was sent to the ED for further evaluation and management.    PT Comments    Patient presents alert, agreeable and cooperative for therapy.  Patient demonstrates fair/good return for using LUE to help pull self to sitting using bed rail, fair/good sitting balance while completing BLE exercises seated at bed at bedside, required active assistance with most exercises due to weakness and c/o increasing pain right knee, and fair/good return for scooting forward/laterally at bedside using LUE/LLE.  Patient demonstrates fair/good carryover for using LUE and LLE to help reposition self when put back to bed.  Patient will benefit from continued physical therapy in hospital and recommended venue below to increase strength, balance, endurance for safe ADLs and gait.   Follow Up Recommendations  SNF     Equipment Recommendations  None recommended by PT;Other (comment) (to be determined)    Recommendations for Other Services       Precautions / Restrictions Precautions Precautions: Fall Type of Shoulder Precautions: NWB R LE and R UE Required Braces or Orthoses: Sling Restrictions Weight Bearing Restrictions: Yes RUE Weight Bearing: Non  weight bearing RLE Weight Bearing: Non weight bearing    Mobility  Bed Mobility Overal bed mobility: Needs Assistance Bed Mobility: Supine to Sit;Sit to Supine     Supine to sit: Mod assist;Max assist Sit to supine: Max assist   General bed mobility comments: increased time, labored movement, increased pain RUE/LE    Transfers                    Ambulation/Gait                 Stairs             Wheelchair Mobility    Modified Rankin (Stroke Patients Only)       Balance Overall balance assessment: Needs assistance Sitting-balance support: Feet supported;Single extremity supported Sitting balance-Leahy Scale: Fair Sitting balance - Comments: fair/good seated EOB                                    Cognition Arousal/Alertness: Awake/alert Behavior During Therapy: WFL for tasks assessed/performed Overall Cognitive Status: History of cognitive impairments - at baseline                                        Exercises General Exercises - Lower Extremity Long Arc Quad: Seated;AAROM;Strengthening;10 reps;Left Hip Flexion/Marching: Seated;AAROM;Strengthening;Both;10 reps Toe Raises: Seated;AROM;Strengthening;Both;10 reps Heel Raises: Seated;AROM;Strengthening;Both;10 reps    General Comments  Pertinent Vitals/Pain Pain Assessment: Faces Faces Pain Scale: Hurts even more Pain Location: RUE, RLE Pain Descriptors / Indicators: Grimacing;Guarding;Sore Pain Intervention(s): Limited activity within patient's tolerance;Monitored during session;Repositioned    Home Living                      Prior Function            PT Goals (current goals can now be found in the care plan section) Acute Rehab PT Goals Patient Stated Goal: return home PT Goal Formulation: With patient Time For Goal Achievement: 01/25/21 Potential to Achieve Goals: Good Progress towards PT goals: Progressing toward goals     Frequency    Min 3X/week      PT Plan Current plan remains appropriate    Co-evaluation              AM-PAC PT "6 Clicks" Mobility   Outcome Measure  Help needed turning from your back to your side while in a flat bed without using bedrails?: A Lot Help needed moving from lying on your back to sitting on the side of a flat bed without using bedrails?: A Lot Help needed moving to and from a bed to a chair (including a wheelchair)?: Total Help needed standing up from a chair using your arms (e.g., wheelchair or bedside chair)?: Total Help needed to walk in hospital room?: Total Help needed climbing 3-5 steps with a railing? : Total 6 Click Score: 8    End of Session   Activity Tolerance: Patient tolerated treatment well;Patient limited by fatigue;Patient limited by pain Patient left: in bed;with call bell/phone within reach;with bed alarm set Nurse Communication: Mobility status PT Visit Diagnosis: Unsteadiness on feet (R26.81);Other abnormalities of gait and mobility (R26.89);Muscle weakness (generalized) (M62.81)     Time: 0370-4888 PT Time Calculation (min) (ACUTE ONLY): 22 min  Charges:  $Therapeutic Exercise: 8-22 mins $Therapeutic Activity: 8-22 mins                     10:16 AM, 01/20/21 Lonell Grandchild, MPT Physical Therapist with [facility] [phone] office [phone] mobile phone

## 2021-01-21 ENCOUNTER — Inpatient Hospital Stay
Admission: RE | Admit: 2021-01-21 | Discharge: 2021-02-09 | Disposition: A | Discharge status: Nursing Home (Includes ICF) | Payer: Medicare Other | Source: Ambulatory Visit | Attending: Internal Medicine | Admitting: Internal Medicine

## 2021-01-21 DIAGNOSIS — Z9181 History of falling: Secondary | ICD-10-CM | POA: Diagnosis not present

## 2021-01-21 DIAGNOSIS — E1122 Type 2 diabetes mellitus with diabetic chronic kidney disease: Secondary | ICD-10-CM | POA: Diagnosis not present

## 2021-01-21 DIAGNOSIS — Z96642 Presence of left artificial hip joint: Secondary | ICD-10-CM | POA: Diagnosis not present

## 2021-01-21 DIAGNOSIS — R2681 Unsteadiness on feet: Secondary | ICD-10-CM | POA: Diagnosis not present

## 2021-01-21 DIAGNOSIS — R262 Difficulty in walking, not elsewhere classified: Secondary | ICD-10-CM | POA: Diagnosis not present

## 2021-01-21 DIAGNOSIS — E876 Hypokalemia: Secondary | ICD-10-CM | POA: Diagnosis not present

## 2021-01-21 DIAGNOSIS — I1310 Hypertensive heart and chronic kidney disease without heart failure, with stage 1 through stage 4 chronic kidney disease, or unspecified chronic kidney disease: Secondary | ICD-10-CM | POA: Diagnosis not present

## 2021-01-21 DIAGNOSIS — M545 Low back pain, unspecified: Secondary | ICD-10-CM | POA: Diagnosis not present

## 2021-01-21 DIAGNOSIS — F17290 Nicotine dependence, other tobacco product, uncomplicated: Secondary | ICD-10-CM | POA: Diagnosis not present

## 2021-01-21 DIAGNOSIS — E1169 Type 2 diabetes mellitus with other specified complication: Secondary | ICD-10-CM | POA: Diagnosis not present

## 2021-01-21 DIAGNOSIS — N184 Chronic kidney disease, stage 4 (severe): Secondary | ICD-10-CM | POA: Diagnosis not present

## 2021-01-21 DIAGNOSIS — S7291XG Unspecified fracture of right femur, subsequent encounter for closed fracture with delayed healing: Secondary | ICD-10-CM | POA: Diagnosis not present

## 2021-01-21 DIAGNOSIS — S42391K Other fracture of shaft of right humerus, subsequent encounter for fracture with nonunion: Secondary | ICD-10-CM | POA: Diagnosis not present

## 2021-01-21 DIAGNOSIS — M179 Osteoarthritis of knee, unspecified: Secondary | ICD-10-CM | POA: Diagnosis not present

## 2021-01-21 DIAGNOSIS — D649 Anemia, unspecified: Secondary | ICD-10-CM | POA: Diagnosis not present

## 2021-01-21 DIAGNOSIS — I700 Atherosclerosis of aorta: Secondary | ICD-10-CM | POA: Diagnosis not present

## 2021-01-21 DIAGNOSIS — S7291XA Unspecified fracture of right femur, initial encounter for closed fracture: Secondary | ICD-10-CM | POA: Diagnosis not present

## 2021-01-21 DIAGNOSIS — M9711XD Periprosthetic fracture around internal prosthetic right knee joint, subsequent encounter: Secondary | ICD-10-CM | POA: Diagnosis not present

## 2021-01-21 DIAGNOSIS — N179 Acute kidney failure, unspecified: Secondary | ICD-10-CM | POA: Diagnosis not present

## 2021-01-21 DIAGNOSIS — M5430 Sciatica, unspecified side: Secondary | ICD-10-CM | POA: Diagnosis not present

## 2021-01-21 DIAGNOSIS — S72301D Unspecified fracture of shaft of right femur, subsequent encounter for closed fracture with routine healing: Secondary | ICD-10-CM | POA: Diagnosis not present

## 2021-01-21 DIAGNOSIS — K219 Gastro-esophageal reflux disease without esophagitis: Secondary | ICD-10-CM | POA: Diagnosis not present

## 2021-01-21 DIAGNOSIS — S42401D Unspecified fracture of lower end of right humerus, subsequent encounter for fracture with routine healing: Secondary | ICD-10-CM | POA: Diagnosis not present

## 2021-01-21 DIAGNOSIS — M48061 Spinal stenosis, lumbar region without neurogenic claudication: Secondary | ICD-10-CM | POA: Diagnosis not present

## 2021-01-21 DIAGNOSIS — I10 Essential (primary) hypertension: Secondary | ICD-10-CM | POA: Diagnosis not present

## 2021-01-21 DIAGNOSIS — M6281 Muscle weakness (generalized): Secondary | ICD-10-CM | POA: Diagnosis not present

## 2021-01-21 DIAGNOSIS — E785 Hyperlipidemia, unspecified: Secondary | ICD-10-CM | POA: Diagnosis not present

## 2021-01-21 DIAGNOSIS — J449 Chronic obstructive pulmonary disease, unspecified: Secondary | ICD-10-CM | POA: Diagnosis not present

## 2021-01-21 DIAGNOSIS — Z87891 Personal history of nicotine dependence: Secondary | ICD-10-CM | POA: Diagnosis not present

## 2021-01-21 DIAGNOSIS — D631 Anemia in chronic kidney disease: Secondary | ICD-10-CM | POA: Diagnosis not present

## 2021-01-21 DIAGNOSIS — E119 Type 2 diabetes mellitus without complications: Secondary | ICD-10-CM | POA: Diagnosis not present

## 2021-01-21 LAB — BASIC METABOLIC PANEL
Anion gap: 6 (ref 5–15)
BUN: 44 mg/dL — ABNORMAL HIGH (ref 8–23)
CO2: 24 mmol/L (ref 22–32)
Calcium: 9.3 mg/dL (ref 8.9–10.3)
Chloride: 111 mmol/L (ref 98–111)
Creatinine, Ser: 2.2 mg/dL — ABNORMAL HIGH (ref 0.44–1.00)
GFR, Estimated: 24 mL/min — ABNORMAL LOW (ref >60)
Glucose, Bld: 130 mg/dL — ABNORMAL HIGH (ref 70–99)
Potassium: 3.8 mmol/L (ref 3.5–5.1)
Sodium: 141 mmol/L (ref 135–145)

## 2021-01-21 LAB — CBC
HCT: 31.9 % — ABNORMAL LOW (ref 36.0–46.0)
Hemoglobin: 10 g/dL — ABNORMAL LOW (ref 12.0–15.0)
MCH: 30.2 pg (ref 26.0–34.0)
MCHC: 31.3 g/dL (ref 30.0–36.0)
MCV: 96.4 fL (ref 80.0–100.0)
Platelets: 335 10*3/uL (ref 150–400)
RBC: 3.31 MIL/uL — ABNORMAL LOW (ref 3.87–5.11)
RDW: 13.2 % (ref 11.5–15.5)
WBC: 9.6 10*3/uL (ref 4.0–10.5)
nRBC: 0 % (ref 0.0–0.2)

## 2021-01-21 LAB — GLUCOSE, CAPILLARY
Glucose-Capillary: 124 mg/dL — ABNORMAL HIGH (ref 70–99)
Glucose-Capillary: 125 mg/dL — ABNORMAL HIGH (ref 70–99)
Glucose-Capillary: 110 mg/dL — ABNORMAL HIGH (ref 70–99)

## 2021-01-21 LAB — MAGNESIUM: Magnesium: 2 mg/dL (ref 1.7–2.4)

## 2021-01-21 LAB — AMMONIA: Ammonia: 22 umol/L (ref 9–35)

## 2021-01-21 MED ORDER — METHOCARBAMOL 500 MG PO TABS: 750.0000 mg | ORAL_TABLET | Freq: Three times a day (TID) | ORAL | 0 refills | Status: DC | PRN

## 2021-01-21 MED ORDER — ALPRAZOLAM 0.5 MG PO TABS: 0.5000 mg | ORAL_TABLET | Freq: Two times a day (BID) | ORAL | 0 refills | Status: DC | PRN

## 2021-01-21 MED ORDER — INSULIN DEGLUDEC 100 UNIT/ML ~~LOC~~ SOPN: 9.0000 [IU] | PEN_INJECTOR | Freq: Every day | SUBCUTANEOUS | 0 refills | Status: DC

## 2021-01-21 MED ORDER — ENSURE ENLIVE PO LIQD: 237.0000 mL | Freq: Two times a day (BID) | ORAL | 12 refills | Status: DC

## 2021-01-21 MED ORDER — ADULT MULTIVITAMIN W/MINERALS CH: 1.0000 | ORAL_TABLET | Freq: Every day | ORAL | 0 refills | Status: DC

## 2021-01-21 MED ORDER — OXYCODONE HCL 5 MG PO TABS: 5.0000 mg | ORAL_TABLET | Freq: Three times a day (TID) | ORAL | 0 refills | Status: DC | PRN

## 2021-01-21 NOTE — Discharge Summary (Signed)
Physician Discharge Summary  [name] OJJ:[id number] DOB: 12-10-1950 DOA: 01/08/2021  PCP: [name], MD  Admit date: 01/08/2021  Discharge date: 01/21/2021  Admitted From:Home  Disposition:  SNF  Recommendations for Outpatient Follow-up:  1. Follow up with PCP in 1-2 weeks 2. Continue on medications as previous 3. Continue on pain management for chronic fractures that are inoperable 4. Continue on Ensure supplementation to ensure adequate caloric intake 5. Stage IV CKD, recommend outpatient nephrology evaluation 6. Continue NWB status for 3 more weeks and follow up with orthopedics  Home Health: Noted  Equipment/Devices: None  Discharge Condition:Stable  CODE STATUS: DNR  Diet recommendation: Heart Healthy  Brief/Interim Summary: 70 y.o.femalewith who is a reformed smoker with medical history significant forHTN, HLD, T2DM, dementia and obesity, COPD admitted on 01/08/2021 after falling at home with right distal humerus/elbow and right distal femur/knee fracture, not amenable to surgical fixation, requiring prolonged rehab.  She did not have poor oral intake and was noted to have some element of dehydration as well as AKI that has improved considerably with IV infusion, however her creatinine levels remain above 2.  She appears to have stage IV CKD and will require outpatient nephrology evaluation.  She was noted to have episodes of acute encephalopathy likely metabolic in the setting of UTI which was treated with IV Rocephin.  Urine cultures with multiple species otherwise noted and she has finished a 3-day course.  She has had very inadequate oral intake and was placed on a calorie count during the course of this hospitalization. Family did not want feeding tube for her.  She is to remain nonweightbearing for the next 3 weeks and then follow-up with orthopedics in the outpatient setting.  She continues have some persistent encephalopathy in the setting of progressive  dementia.  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  Discharge Instructions  Discharge Instructions    Diet - low sodium heart healthy   Complete by: As directed    Discharge wound care:   Complete by: As directed    Daily dressing changes.   Increase activity slowly   Complete by: As directed      Allergies as of 01/21/2021      Reactions   Gabapentin Other (See Comments)   suicidal thoughts   Ibuprofen Other (See Comments)   Kidney problems per caregiver      Medication List    STOP taking these medications   chlorthalidone 25 MG tablet Commonly known as: HYGROTON     TAKE these medications   ALPRAZolam 0.5 MG tablet Commonly known as: XANAX Take 1 tablet (0.5 mg total) by mouth 2 (two) times daily as needed for anxiety.   aspirin EC 81 MG tablet Take 81 mg by mouth daily.  atenolol 25 MG tablet Commonly known as: TENORMIN Take 1 tablet (25 mg total) by mouth daily.   donepezil 5 MG tablet Commonly known as: ARICEPT Take 5 mg by mouth daily.   feeding supplement Liqd Take 237 mLs by mouth 2 (two) times daily between meals.   hydrALAZINE 25 MG tablet Commonly known as:  APRESOLINE Take 25 mg by mouth 3 (three) times daily.   ibuprofen 800 MG tablet Commonly known as: ADVIL Take 1 tablet (800 mg total) by mouth every 8 (eight) hours as needed.   insulin degludec 100 UNIT/ML FlexTouch Pen Commonly known as: TRESIBA Inject 9 Units into the skin at bedtime. What changed: how much to take   methocarbamol 500 MG tablet Commonly known as: ROBAXIN Take 1.5 tablets (750 mg total) by mouth every 8 (eight) hours as needed for muscle spasms.   multivitamin with minerals Tabs tablet Take 1 tablet by mouth daily. Start taking on: Jan 22, 2021   oxyCODONE 5 MG immediate release tablet Commonly known as: Roxicodone Take 1 tablet (5 mg total) by mouth every 8 (eight) hours as needed for breakthrough pain.   rosuvastatin 5 MG tablet Commonly known as: CRESTOR Take 5 mg by mouth daily.   sertraline 50 MG tablet Commonly known as: ZOLOFT Take 50 mg by mouth daily.   verapamil 240 MG CR tablet Commonly known as: CALAN-SR Take 240 mg by mouth daily.            Discharge Care Instructions  (From admission, onward)         Start     Ordered   01/21/21 0000  Discharge wound care:       Comments: Daily dressing changes.   01/21/21 1051          Contact information for follow-up providers    [name], MD. Schedule an appointment as soon as possible for a visit in 1 week(s).   Specialty: Family Medicine Contact information: [address] [place] [address] [phone]            Contact information for after-discharge care    [facility] .   Service: Skilled Nursing Contact information: [address] [place] [place] [address] [phone]                 Allergies  Allergen Reactions  . Gabapentin Other (See Comments)    suicidal thoughts  . Ibuprofen Other (See Comments)    Kidney problems per caregiver     Consultations:  Orthopedics   Procedures/Studies: DG Chest 2 View  Result Date: 01/08/2021 CLINICAL DATA:  Fall today.  Right elbow fracture. EXAM: CHEST - 2 VIEW COMPARISON:  12/31/2017 FINDINGS: Stable mild cardiomegaly. Aortic atherosclerotic calcification noted. Both lungs are clear. IMPRESSION: Stable mild cardiomegaly. No active lung disease. Electronically Signed   By: Marlaine Hind M.D.   On: 01/08/2021 17:04   DG Pelvis 1-2 Views  Result Date: 01/08/2021 CLINICAL DATA:  Pain after fall. EXAM: PELVIS - 1-2 VIEW COMPARISON:  None. FINDINGS: The patient is status post left hip replacement. Visualized hardware is in good position. No acute fractures are identified. Vascular calcifications are noted. IMPRESSION: No acute abnormalities. Electronically Signed   By: Dorise Bullion III M.D   On: 01/08/2021 17:01   DG Shoulder Right  Result Date: 01/08/2021 CLINICAL DATA:  Fall outside today.  Right upper extremity pain. EXAM: RIGHT SHOULDER - 2+ VIEW COMPARISON:  None. FINDINGS: There is no evidence of fracture or dislocation. Mild acromioclavicular and glenohumeral degenerative change. Subcortical cystic change in the lateral humeral head suggests underlying rotator cuff arthropathy. Small subacromial spur. Calcifications projecting over the inferior glenoid. IMPRESSION: 1. No acute fracture or subluxation of the right shoulder. 2. Mild acromioclavicular and glenohumeral degenerative change. Small subacromial spur. 3. Subcortical cystic change in the lateral humeral head suggests underlying rotator cuff arthropathy. Electronically Signed   By: Keith Rake M.D.   On: 01/08/2021 16:55   DG Elbow Complete Right  Result Date: 01/08/2021 CLINICAL DATA:  Pain after fall. EXAM: RIGHT ELBOW - COMPLETE 3+ VIEW COMPARISON:  None. FINDINGS: There is a supracondylar fracture through the distal humerus. A joint effusion is identified. No other abnormalities. IMPRESSION: Supracondylar distal  humeral fracture with a joint effusion. Electronically Signed   By: Dorise Bullion III M.D   On: 01/08/2021 16:54   DG Wrist Complete Right  Result Date: 01/08/2021 CLINICAL DATA:  Pain after fall EXAM: RIGHT WRIST - COMPLETE 3+ VIEW COMPARISON:  None. FINDINGS: There is no evidence of fracture or dislocation. There is no evidence of arthropathy or other focal bone abnormality. Soft tissues are unremarkable. IMPRESSION: Negative. Electronically Signed   By: Dorise Bullion III M.D   On: 01/08/2021 16:55   CT HEAD WO CONTRAST  Result Date: 01/19/2021 CLINICAL DATA:  Mental status change, unknown cause. Additional history provided: Sudden onset altered mental status, recent injury to arm. EXAM: CT HEAD WITHOUT CONTRAST TECHNIQUE: Contiguous axial images were obtained from the base of the skull through the vertex without intravenous contrast. COMPARISON:  Prior head CT examinations 01/08/2021. FINDINGS: Brain: Mild motion degradation. Mild cerebral atrophy. Moderate patchy and ill-defined hypoattenuation within the cerebral white matter, nonspecific but compatible with chronic small vessel ischemic disease. There is no acute intracranial hemorrhage. No demarcated cortical infarct. No extra-axial fluid collection. No evidence of intracranial mass. No midline shift. Vascular: No hyperdense vessel.  Atherosclerotic calcifications. Skull: Normal. Negative for fracture or focal lesion. Sinuses/Orbits: Visualized orbits show no acute finding. Mild bilateral ethmoid sinus mucosal thickening. Small right sphenoid sinus mucous retention cyst. Other: Large bilateral mastoid effusions. IMPRESSION: Mildly motion degraded examination. No evidence of acute intracranial abnormality. Mild cerebral atrophy and moderate cerebral white matter chronic small vessel ischemic disease, stable as compared to the head CT of 01/08/2021. Electronically Signed   By: Kellie Simmering DO   On: 01/19/2021 13:54   CT Head Wo Contrast  Result  Date: 01/08/2021 CLINICAL DATA:  Fall outside today. EXAM: CT HEAD WITHOUT CONTRAST TECHNIQUE: Contiguous axial images were obtained from the base of the skull through the vertex without intravenous contrast. COMPARISON:  Head CT 08/28/2019 FINDINGS: Brain: No intracranial hemorrhage, mass effect, or midline shift. No hydrocephalus. The basilar cisterns are patent. Stable degree of chronic small vessel ischemia from prior. No evidence of territorial infarct or acute ischemia. No extra-axial or intracranial fluid collection. Vascular: Atherosclerosis of skullbase vasculature without hyperdense vessel or abnormal calcification. Skull: No fracture or focal lesion. Sinuses/Orbits: Chronic opacification of bilateral mastoid air cells. No acute findings. Bilateral cataract resection. Other: None. IMPRESSION: 1. No acute intracranial abnormality. No skull fracture. 2. Stable degree of chronic small vessel ischemia. 3. Chronic opacification of bilateral mastoid air cells. Electronically Signed   By: Keith Rake M.D.   On: 01/08/2021 17:05   CT Cervical Spine Wo Contrast  Result Date: 01/08/2021 CLINICAL DATA:  Fall outside today. EXAM: CT CERVICAL SPINE WITHOUT CONTRAST TECHNIQUE:  Multidetector CT imaging of the cervical spine was performed without intravenous contrast. Multiplanar CT image reconstructions were also generated. COMPARISON:  None. FINDINGS: Alignment: Straightening of normal lordosis. Trace degenerative anterolisthesis of C3 on C4 and retrolisthesis of C5 on C6. No traumatic subluxation. Skull base and vertebrae: No acute fracture. Vertebral body heights are maintained. The dens and skull base are intact. Soft tissues and spinal canal: No prevertebral fluid or swelling. No visible canal hematoma. Disc levels: Degenerative disc disease C4-C5, C5-C6, and C6-C7 with disc space narrowing and endplate spurring. There is multilevel facet hypertrophy. Facet ankylosis at C2-C3 on the left. Upper chest: Mild  emphysema.  No acute findings. Other: Carotid calcifications. IMPRESSION: Multilevel degenerative change in the cervical spine without acute fracture or subluxation. Electronically Signed   By: Keith Rake M.D.   On: 01/08/2021 17:08   DG Knee Complete 4 Views Left  Result Date: 01/08/2021 CLINICAL DATA:  Pain after fall EXAM: LEFT KNEE - COMPLETE 4+ VIEW COMPARISON:  None. FINDINGS: The patient is status post left knee replacement. Hardware is intact with no evidence of failure. No fractures are identified. No joint effusion. Vascular calcifications noted. IMPRESSION: Left knee replacement without evidence of hardware failure. No fractures or effusions. Electronically Signed   By: Dorise Bullion III M.D   On: 01/08/2021 17:00   DG Knee Complete 4 Views Right  Result Date: 01/08/2021 CLINICAL DATA:  Pain after fall EXAM: RIGHT KNEE - COMPLETE 4+ VIEW COMPARISON:  None FINDINGS: The patient is status post right knee replacement. There is a comminuted fracture involving the lateral and medial aspects of the distal femur. A fat fluid level seen on the lateral view consistent with a hemarthrosis. The proximal fibula and proximal tibia are intact. IMPRESSION: Comminuted fracture of the distal lateral and medial femur. Fracture lines extend to the femoral component of the knee replacement. Hemarthrosis consistent with the distal femoral fracture. Electronically Signed   By: Dorise Bullion III M.D   On: 01/08/2021 16:59      Discharge Exam: Vitals:   01/20/21 2052 01/21/21 0500  BP: (!) 159/87 (!) 193/112  Pulse: 69 69  Resp: 19 18  Temp: 98 F (36.7 C) 97.7 F (36.5 C)  SpO2: 98% 98%   Vitals:   01/20/21 1426 01/20/21 1530 01/20/21 2052 01/21/21 0500  BP: (!) 182/106 (!) 157/100 (!) 159/87 (!) 193/112  Pulse: 66 67 69 69  Resp: 16  19 18   Temp: 98.6 F (37 C)  98 F (36.7 C) 97.7 F (36.5 C)  TempSrc:    Oral  SpO2: 100%  98% 98%  Weight:      Height:        General: Pt is  alert, awake, not in acute distress, pleasantly confused Cardiovascular: RRR, S1/S2 +, no rubs, no gallops Respiratory: CTA bilaterally, no wheezing, no rhonchi Abdominal: Soft, NT, ND, bowel sounds + Extremities: Right upper and lower extremity is immobilized.    The results of significant diagnostics from this hospitalization (including imaging, microbiology, ancillary and laboratory) are listed below for reference.     Microbiology: Recent Results (from the past 240 hour(s))  Culture, Urine     Status: Abnormal   Collection Time: 01/17/21  6:04 PM   Specimen: Urine, Catheterized  Result Value Ref Range Status   Specimen Description   Final    URINE, CATHETERIZED Performed at [facility], [address], [place], [place] [address]    Special Requests   Final  NONE Performed at [facility], [address] [place], [place] [address]    Culture MULTIPLE SPECIES PRESENT, SUGGEST RECOLLECTION (A)  Final   Report Status 01/19/2021 FINAL  Final     Labs: BNP (last 3 results) No results for input(s): BNP in the last 8760 hours. Basic Metabolic Panel: Recent Labs  Lab 01/17/21 0632 01/18/21 0636 01/19/21 0628 01/20/21 0711 01/21/21 0627  NA 138 139 140 140 141  K 4.5 4.4 4.0 4.6 3.8  CL 112* 111 112* 109 111  CO2 18* 20* 21* 23 24  GLUCOSE 119* 114* 132* 103* 130*  BUN 48* 50* 48* 49* 44*  CREATININE 2.64* 2.61* 2.28* 2.37* 2.20*  CALCIUM 8.7* 9.0 9.2 9.3 9.3  MG 1.9 1.7 1.6* 2.0 2.0   Liver Function Tests: No results for input(s): AST, ALT, ALKPHOS, BILITOT, PROT, ALBUMIN in the last 168 hours. No results for input(s): LIPASE, AMYLASE in the last 168 hours. Recent Labs  Lab 01/18/21 1313 01/20/21 0852 01/21/21 0627  AMMONIA 36* 10 22   CBC: Recent Labs  Lab 01/19/21 0628 01/21/21 0627  WBC 9.4 9.6  HGB 10.0* 10.0*  HCT 31.5* 31.9*  MCV 95.7 96.4  PLT 308 335   Cardiac Enzymes: No results for input(s): CKTOTAL, CKMB, CKMBINDEX, TROPONINI in the last  168 hours. BNP: Invalid input(s): POCBNP CBG: Recent Labs  Lab 01/20/21 1616 01/20/21 2055 01/20/21 2356 01/21/21 0358 01/21/21 0736  GLUCAP 121* 174* 149* 124* 125*   D-Dimer No results for input(s): DDIMER in the last 72 hours. Hgb A1c No results for input(s): HGBA1C in the last 72 hours. Lipid Profile No results for input(s): CHOL, HDL, LDLCALC, TRIG, CHOLHDL, LDLDIRECT in the last 72 hours. Thyroid function studies No results for input(s): TSH, T4TOTAL, T3FREE, THYROIDAB in the last 72 hours.  Invalid input(s): FREET3 Anemia work up No results for input(s): VITAMINB12, FOLATE, FERRITIN, TIBC, IRON, RETICCTPCT in the last 72 hours. Urinalysis    Component Value Date/Time   COLORURINE YELLOW 01/17/2021 1804   APPEARANCEUR CLOUDY (A) 01/17/2021 1804   LABSPEC 1.012 01/17/2021 1804   PHURINE 7.0 01/17/2021 1804   GLUCOSEU NEGATIVE 01/17/2021 1804   HGBUR NEGATIVE 01/17/2021 1804   BILIRUBINUR NEGATIVE 01/17/2021 1804   KETONESUR NEGATIVE 01/17/2021 1804   PROTEINUR >=300 (A) 01/17/2021 1804   UROBILINOGEN 0.2 04/02/2009 1811   NITRITE POSITIVE (A) 01/17/2021 1804   LEUKOCYTESUR LARGE (A) 01/17/2021 1804   Sepsis Labs Invalid input(s): PROCALCITONIN,  WBC,  LACTICIDVEN Microbiology Recent Results (from the past 240 hour(s))  Culture, Urine     Status: Abnormal   Collection Time: 01/17/21  6:04 PM   Specimen: Urine, Catheterized  Result Value Ref Range Status   Specimen Description   Final    URINE, CATHETERIZED Performed at [facility], [address], [place], [place] [address]    Special Requests   Final    NONE Performed at [facility], [address] [place], [place] [address]    Culture MULTIPLE SPECIES PRESENT, [place] (A)  Final   Report Status 01/19/2021 FINAL  Final     Time coordinating discharge: 35 minutes  SIGNED:   Rodena Goldmann, DO [facility] 01/21/2021, 11:52 AM  If 7PM-7AM, please contact  night-coverage [email or web address]

## 2021-01-21 NOTE — TOC Transition Note (Signed)
Transition of Care [facility]) - CM/SW Discharge Note   Patient Details  Name: [name] MRN: [id number] Date of Birth: [birth year]  [facility] CM/SW Contact:  Shade Flood, LCSW Phone Number: 01/21/2021, 11:05 AM   Clinical Narrative:     Pt stable for dc per MD. Received insurance authorization for SNF rehab. Updated Kerri at [facility] and they can accept pt today. Updated pt's son who remains in agreement with the dc plan.   DC clinical will be sent electronically. RN to call report.   There are no other TOC needs for dc.  Final next level of care: Skilled Nursing Facility Barriers to Discharge: Barriers Resolved   Patient Goals and CMS Choice Patient states their goals for this hospitalization and ongoing recovery are:: to go to rehab. CMS Medicare.gov Compare Post Acute Care list provided to:: Patient Represenative (must comment) Choice offered to / list presented to : Adult Children  Discharge Placement              Patient [facility] bed at: [facility] Patient to be transferred to facility by: W/C Name of family member notified: [name] Patient and family notified of of transfer: 01/21/21  Discharge Plan and Services                                     Social Determinants of Health (SDOH) Interventions     Readmission Risk Interventions Readmission Risk Prevention Plan 01/11/2021  Medication Screening Complete  Transportation Screening Complete  Some recent data might be hidden

## 2021-01-21 NOTE — Progress Notes (Signed)
Report called and given to Deetta Perla LPN, [facility]. All questions were answered and no further questions at this time. Patient in stable condition and in no acute distress at time of discharge. Patient will be transported by [facility] Staff.

## 2021-01-21 NOTE — Progress Notes (Signed)
Calorie Count Note  48 hour calorie count ordered.  Diet: regular Supplements: Magic cup BID with meals, each supplement provides 290 kcal and 9 grams of protein; Ensure Enlive po BID, each supplement provides 350 kcal and 20 grams of protein  Per chart review, pt and family would not desire artificial means of nutrition/ hydration.   Per CSW notes, plan to d/c to [facility] today.   No meal tickets found in envelope. Used meal percentages to calculate needs  5/5 Breakfast: 62 kcals, 3 grams protein Lunch: 0 kcals, 0 grams protein Dinner: 186 kcals, 7 grams protein  Supplements: 1 Ensure Enlive (350 kcals, 20 grams protein)  Total intake: 598 kcal (31% of minimum estimated needs)  30 grams protein (32% of minimum estimated needs)  5/5 Breakfast: 600 kcals, 28 grams protein Lunch: 0 kcals, 0 grams protein Dinner: 273 kcals, 9 grams protein Supplements:  1 Ensure Enlive (350 kcals, 20 grams protein)  Total intake: 1223 kcal (64% of minimum estimated needs)  57 grams protein (60% of minimum estimated needs)  Average Total intake: 911 kcal (48% of minimum estimated needs)  44 grams protein (46% of minimum estimated needs)  Nutrition Dx: Increased nutrient needsrelated to wound healing (bone healing)as evidenced by estimated needs; ongoing  Goal: Patient will meet greater than or equal to 90% of their needs (if feasible given pt dementia andif congruent withpt goals of care); unmet  Intervention:   -D/c calorie count -Continue Magic cup BID with meals, each supplement provides 290 kcal and 9 grams of protein -Continue Ensure Enlive po BID, each supplement provides 350 kcal and 20 grams of protein  Loistine Chance, RD, LDN, [place] Registered Dietitian II Certified Diabetes Care and Education Specialist Please refer to [facility] for RD and/or RD on-call/weekend/after hours pager

## 2021-01-21 NOTE — Care Management Important Message (Signed)
Important Message  Patient Details  Name: [name] MRN: [id number] Date of Birth: [birth year]   Medicare Important Message Given:  Yes     [name] 01/21/2021, 11:35 AM

## 2021-01-24 ENCOUNTER — Encounter: Payer: Self-pay | Admitting: Adult Health

## 2021-01-24 ENCOUNTER — Non-Acute Institutional Stay (SKILLED_NURSING_FACILITY): Payer: Medicare Other | Admitting: Adult Health

## 2021-01-24 ENCOUNTER — Encounter (INDEPENDENT_AMBULATORY_CARE_PROVIDER_SITE_OTHER): Payer: Medicare Other | Admitting: Ophthalmology

## 2021-01-24 DIAGNOSIS — N184 Chronic kidney disease, stage 4 (severe): Secondary | ICD-10-CM

## 2021-01-24 DIAGNOSIS — I700 Atherosclerosis of aorta: Secondary | ICD-10-CM

## 2021-01-24 DIAGNOSIS — E669 Obesity, unspecified: Secondary | ICD-10-CM

## 2021-01-24 DIAGNOSIS — E1169 Type 2 diabetes mellitus with other specified complication: Secondary | ICD-10-CM

## 2021-01-24 DIAGNOSIS — S42391K Other fracture of shaft of right humerus, subsequent encounter for fracture with nonunion: Secondary | ICD-10-CM

## 2021-01-24 DIAGNOSIS — S7291XA Unspecified fracture of right femur, initial encounter for closed fracture: Secondary | ICD-10-CM | POA: Diagnosis not present

## 2021-01-24 DIAGNOSIS — I129 Hypertensive chronic kidney disease with stage 1 through stage 4 chronic kidney disease, or unspecified chronic kidney disease: Secondary | ICD-10-CM

## 2021-01-24 DIAGNOSIS — Z87891 Personal history of nicotine dependence: Secondary | ICD-10-CM | POA: Diagnosis not present

## 2021-01-24 DIAGNOSIS — J449 Chronic obstructive pulmonary disease, unspecified: Secondary | ICD-10-CM | POA: Diagnosis not present

## 2021-01-24 DIAGNOSIS — I1310 Hypertensive heart and chronic kidney disease without heart failure, with stage 1 through stage 4 chronic kidney disease, or unspecified chronic kidney disease: Secondary | ICD-10-CM | POA: Diagnosis not present

## 2021-01-24 DIAGNOSIS — E1122 Type 2 diabetes mellitus with diabetic chronic kidney disease: Secondary | ICD-10-CM | POA: Diagnosis not present

## 2021-01-24 DIAGNOSIS — F419 Anxiety disorder, unspecified: Secondary | ICD-10-CM

## 2021-01-24 DIAGNOSIS — E785 Hyperlipidemia, unspecified: Secondary | ICD-10-CM

## 2021-01-24 DIAGNOSIS — D631 Anemia in chronic kidney disease: Secondary | ICD-10-CM

## 2021-01-24 DIAGNOSIS — F0150 Vascular dementia without behavioral disturbance: Secondary | ICD-10-CM

## 2021-01-24 NOTE — Progress Notes (Signed)
Location:  [facility] Room Number: 134 Place of Service:  SNF (31)   CODE STATUS: dnr  Allergies  Allergen Reactions  . Gabapentin Other (See Comments)    suicidal thoughts  . Ibuprofen Other (See Comments)    Kidney problems per caregiver    Chief Complaint  Patient presents with  . Hospitalization Follow-up    HPI:  She is a 70 year old woman who has been hospitalized from 01-08-21 through 01-21-21. Her medical history includes: COPD; dementia; obesity; history of smoking. She had a fall at home suffered a right distal humeralelbow fracture and right distal femur/knee fracture, not amenable to surgical fixation. Her intake was poor found with acute on chronic stage 4 CKD. She would benefit from outpatient nephrology. Her uti was treated with 3 days of rocephin. Her po intake was poor in the hospital her family has decided upon no feeding tube. She will need to be nonweight bearing for total of 3 weeks; then follow up. She does have progressive dementia. She is here for short term rehab. She does have significant right elbow pain; and right knee pain. 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 Cancer ([place])    ovarian  . Carpal tunnel syndrome    Nocturnal and positional, bilateral  . COPD (chronic obstructive pulmonary disease) ([place])   . Degenerative joint disease   . Diabetes mellitus    Insulin-dependent diabetes  . Hypercholesteremia   . Hypertension     Past Surgical History:  Procedure Laterality Date  . ABDOMINAL HYSTERECTOMY     BSO  . ABDOMINAL SURGERY     for bleeding after hysterectomy  . CATARACT EXTRACTION W/PHACO Left 07/19/2018    Procedure: CATARACT EXTRACTION PHACO AND INTRAOCULAR LENS PLACEMENT (IOC) LEFT CDE:3.26;  Surgeon: Baruch Goldmann, MD;  Location: [facility];  Service: Ophthalmology;  Laterality: Left;  . CATARACT EXTRACTION W/PHACO Right 08/23/2018   Procedure: CATARACT EXTRACTION PHACO AND INTRAOCULAR LENS PLACEMENT RIGHT EYE;  Surgeon: Baruch Goldmann, MD;  Location: [facility];  Service: Ophthalmology;  Laterality: Right;  right  . CHOLECYSTECTOMY    . COLONOSCOPY  2007   Dr. Gala Romney: internal hemorrhoids, few scattered sigmoid diverticula  . COLONOSCOPY N/A 04/09/2014   JSE:[id number] diverticulosis. Redundant colon. Single colonic polyp-removed as described above.  . ESOPHAGOGASTRODUODENOSCOPY  2008   Dr. Gala Romney: erosive esophagitis, patulous EG junction  . ESOPHAGOGASTRODUODENOSCOPY N/A 04/09/2014   [id number] reflux esophagitis. Schatzki's ring-status post dilation as described above. Hiatal hernia. Gastricerosions-status post gastric biopsy  . HIP CLOSED REDUCTION Left 01/15/2016   Procedure: CLOSED REDUCTION LEFT HIP PROSTHESIS;  Surgeon: Carole Civil, MD;  Location: [facility];  Service: Orthopedics;  Laterality: Left;  . left hip replacement  12/08/2015  . MALONEY DILATION N/A 04/09/2014   Procedure: Venia Minks DILATION;  Surgeon: Daneil Dolin, MD;  Location: [facility];  Service: Endoscopy;  Laterality: N/A;  . REPLACEMENT TOTAL KNEE     bilateral  . SAVORY DILATION N/A 04/09/2014   Procedure: SAVORY DILATION;  Surgeon: Daneil Dolin, MD;  Location: [facility];  Service: Endoscopy;  Laterality: N/A;  . TOENAIL EXCISION     R great toenail; artificial nail  .  TOTAL HIP ARTHROPLASTY Left 12/08/2015   Procedure: TOTAL HIP ARTHROPLASTY;  Surgeon: Carole Civil, MD;  Location: [facility];  Service: Orthopedics;  Laterality: Left;  . TOTAL HIP REVISION Left 01/26/2016   Procedure: LEFT TOTAL HIP REVISION;  Surgeon: Carole Civil, MD;  Location: [facility];  Service: Orthopedics;  Laterality: Left;    Social  History   Socioeconomic History  . Marital status: Widowed    Spouse name: Not on file  . Number of children: Not on file  . Years of education: Not on file  . Highest education level: Not on file  Occupational History  . Not on file  Tobacco Use  . Smoking status: Former Smoker    Packs/day: 1.00    Years: 54.00    Pack years: 54.00    Types: Cigarettes    Quit date: 01/17/2016    Years since quitting: 5.0  . Smokeless tobacco: Never Used  . Tobacco comment: Smoked [phone] up to 1 pack per day; she did stop  9 months during pregnancy  Vaping Use  . Vaping Use: Never used  Substance and Sexual Activity  . Alcohol use: No    Alcohol/week: 0.0 standard drinks  . Drug use: No  . Sexual activity: Never    Birth control/protection: Surgical  Other Topics Concern  . Not on file  Social History Narrative  . Not on file   Social Determinants of Health   Financial Resource Strain: Not on file  Food Insecurity: Not on file  Transportation Needs: Not on file  Physical Activity: Not on file  Stress: Not on file  Social Connections: Not on file  Intimate Partner Violence: Not on file   Family History  Problem Relation Age of Onset  . Stroke Mother   . Diabetes Mother   . Diabetes Father   . Cancer Brother        Leukemia and bone cancer  . Colon cancer Neg Hx   . Heart disease Neg Hx       VITAL SIGNS BP 118/88   Pulse 64   Temp 98.2 F (36.8 C)   Resp 18   Ht 5\' 7"  (1.702 m)   Wt 192 lb 9.6 oz (87.4 kg)   SpO2 95%   BMI 30.17 kg/m   Outpatient Encounter Medications as of 01/24/2021  Medication Sig  . ALPRAZolam (XANAX) 0.5 MG tablet Take 1 tablet (0.5 mg total) by mouth 2 (two) times daily as needed for anxiety.  Marland Kitchen aspirin EC 81 MG tablet Take 81 mg by mouth daily.  Marland Kitchen atenolol (TENORMIN) 25 MG tablet Take 1 tablet (25 mg total) by mouth daily.  Marland Kitchen donepezil (ARICEPT) 5 MG tablet Take 5 mg by mouth daily.  . feeding supplement (ENSURE ENLIVE / ENSURE PLUS)  LIQD Take 237 mLs by mouth 2 (two) times daily between meals.  . hydrALAZINE (APRESOLINE) 25 MG tablet Take 25 mg by mouth 3 (three) times daily.  . insulin degludec (TRESIBA) 100 UNIT/ML FlexTouch Pen Inject 9 Units into the skin at bedtime.  . methocarbamol (ROBAXIN) 500 MG tablet Take 1.5 tablets (750 mg total) by mouth every 8 (eight) hours as needed for muscle spasms.  . Multiple Vitamin (MULTIVITAMIN WITH MINERALS) TABS tablet Take 1 tablet by mouth daily.  Marland Kitchen oxyCODONE (ROXICODONE) 5 MG immediate release tablet Take 1 tablet (5 mg total) by mouth every 8 (eight) hours as needed for breakthrough pain.  . rosuvastatin (CRESTOR) 5 MG tablet Take 5 mg  by mouth daily.  . sertraline (ZOLOFT) 50 MG tablet Take 50 mg by mouth daily.  . verapamil (CALAN-SR) 240 MG CR tablet Take 240 mg by mouth daily.    No facility-administered encounter medications on file as of 01/24/2021.     SIGNIFICANT DIAGNOSTIC EXAMS  TODAY  01-08-21: chest x-ray: Stable mild cardiomegaly. No active lung disease.   01-08-21: right shoulder x-ray:  1. No acute fracture or subluxation of the right shoulder. 2. Mild acromioclavicular and glenohumeral degenerative change. Small subacromial spur. 3. Subcortical cystic change in the lateral humeral head suggests underlying rotator cuff arthropathy.  01-08-21: right elbow x-ray: Supracondylar distal humeral fracture with a joint effusion   01-08-21: right wrist x-ray: There is no evidence of fracture or dislocation. There is no evidence of arthropathy or other focal bone abnormality. Soft tissues are unremarkable  01-08-21: right knee x-ray: Comminuted fracture of the distal lateral and medial femur. Fracture lines extend to the femoral component of the knee replacement. Hemarthrosis consistent with the distal femoral fracture.   LABS REVIEWED TODAY;   01-08-21: wbc 9.0; hgb 10.8; hct 32.2; mcv 92.3 plt 269; glucose 121; bun 34; creat 2.86; k+ 2.3; na++ 134; ca 8.1; GFR 17;  mag 1.3; hgb a1c 6.3 01-09-21: mag 1.4 01-10-21: wbc 11.4; hgb 10.8; hct 33.4; mcv 95.2 plt 279; glucose 129; bun 34; creat 2.56; k+ 3.9; na++ 137; ca 9.0 GFR 20 01-14-21: wbc 9.2; hgb 11.2; hct 35.0; mcv 94.9 plt 292; glucose 98; bun 38; creat 2.19; k+ 4.3; na++ 139; ca 9.3 GFR 24 01-17-21: glucose 119; bun 48; creat 2.64; k+ 4.2; na++ 138; ca 8.7; GFR 19; mag 1.9 01-21-21: wbc 9.6; hgb 10.0; hct 31.9; mcv 96.4 plt 335; glucose 130; bun 44; creat 2.20; k+ 3.8; na++ 141; ca 9.3 GFR 24; mag 2.0 ammonia 22   Review of Systems  Constitutional: Negative for malaise/fatigue.  Respiratory: Negative for cough and shortness of breath.   Cardiovascular: Negative for chest pain, palpitations and leg swelling.  Gastrointestinal: Negative for abdominal pain, constipation and heartburn.  Musculoskeletal: Positive for joint pain. Negative for back pain and myalgias.       Right arm and knee pain   Skin: Negative.   Neurological: Negative for dizziness.  Psychiatric/Behavioral: The patient is not nervous/anxious.     Physical Exam Constitutional:      General: She is not in acute distress.    Appearance: She is well-developed. She is obese. She is not diaphoretic.  Neck:     Thyroid: No thyromegaly.  Cardiovascular:     Rate and Rhythm: Normal rate and regular rhythm.     Pulses: Normal pulses.     Heart sounds: Normal heart sounds.  Pulmonary:     Effort: Pulmonary effort is normal. No respiratory distress.     Breath sounds: Normal breath sounds.  Abdominal:     General: Bowel sounds are normal. There is no distension.     Palpations: Abdomen is soft.     Tenderness: There is no abdominal tenderness.  Musculoskeletal:     Cervical back: Neck supple.     Right lower leg: No edema.     Left lower leg: No edema.     Comments: Right arm in ace Right knee splint in place Is able to move all extremities   Lymphadenopathy:     Cervical: No cervical adenopathy.  Skin:    General: Skin is warm and  dry.  Neurological:     Mental Status:  She is alert and oriented to person, place, and time.     Comments: Lethargic   Psychiatric:        Mood and Affect: Mood normal.       ASSESSMENT/ PLAN:  TODAY  1. 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   2. 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 Chronic obstructive pulmonary disease unspecified COPD type: is stable will monitor   5. Hyperlipidemia associated with type 2 diabetes mellitus: is stable will continue crestor 5 mg daily   6. Vascular dementia without behavioral disturbance: is stable will continue aricept 5 mg daily   7. Obesity BMI (30.0-34.9) is 30.17 will monitor   8. Anemia due to stage 4 chronic kidney disease: hgb 10.0 will monitor   9. Anxiety: is stable will continue xanax 0.5 mg twice daily as needed through 02-03-21  10. Aortic atherosclerosis (ct 04-17-18) will monitor   11. CKD stage 4 due to diabetes type 2: stable bun 44; creat 2.20 GFR 24 will setup nephrology consult  Time spent with patient: 45 minutes: coordination of care; medications; therapy goals of care.      Ok Edwards  NP [facility]  Contact [phone] Monday through Friday 8am- 5pm  After hours call [phone]

## 2021-01-26 ENCOUNTER — Non-Acute Institutional Stay (SKILLED_NURSING_FACILITY): Payer: Medicare Other | Admitting: Internal Medicine

## 2021-01-26 ENCOUNTER — Encounter: Payer: Self-pay | Admitting: Internal Medicine

## 2021-01-26 DIAGNOSIS — D631 Anemia in chronic kidney disease: Secondary | ICD-10-CM | POA: Diagnosis not present

## 2021-01-26 DIAGNOSIS — E1122 Type 2 diabetes mellitus with diabetic chronic kidney disease: Secondary | ICD-10-CM

## 2021-01-26 DIAGNOSIS — N179 Acute kidney failure, unspecified: Secondary | ICD-10-CM | POA: Diagnosis not present

## 2021-01-26 DIAGNOSIS — S7291XG Unspecified fracture of right femur, subsequent encounter for closed fracture with delayed healing: Secondary | ICD-10-CM

## 2021-01-26 DIAGNOSIS — N184 Chronic kidney disease, stage 4 (severe): Secondary | ICD-10-CM

## 2021-01-26 DIAGNOSIS — S42391K Other fracture of shaft of right humerus, subsequent encounter for fracture with nonunion: Secondary | ICD-10-CM

## 2021-01-26 NOTE — Assessment & Plan Note (Addendum)
Current A1c 6.3% indicating excellent control.  Discordance between glucoses & A1c may be expected

## 2021-01-26 NOTE — Progress Notes (Signed)
NURSING HOME LOCATION:  [facility] ROOM NUMBER:  [id number]  CODE STATUS:  DNR  PCP:  [name] MD  This is a comprehensive admission note to this SNFperformed on this date less than 30 days from date of admission. Included are preadmission medical/surgical history; reconciled medication list; family history; social history and comprehensive review of systems.  Corrections and additions to the records were documented. Comprehensive physical exam was also performed. Additionally a clinical summary was entered for each active diagnosis pertinent to this admission in the Problem List to enhance continuity of care.  HPI: Patient was hospitalized 4/23 - 01/21/2021 after falling at home and sustaining a right distal humerus/elbow and right distal femur/knee fracture.  These were not amenable to surgical fixation.   Course was complicated by poor oral intake, clinical dehydration, and AKI.  AKI improved with IV rehydration, but her creatinine remained above 2.  She appears to have stage IV CKD for which post hospitalization Nephrology evaluation was recommended. Also the patient did have acute encephalopathy felt to be metabolic in etiology in the setting of possible UTI for which she received IV Rocephin.  The culture actually revealed multiple species rather than a dominant organism. During hospitalization she was placed on a caloric count because of the poor oral intake.  Patient's family did not want tube feeding implemented. She was to remain nonweightbearing for the next 3 weeks and then follow-up with the Orthopedist as an outpatient. She continues to have persistent encephalopathy in the context of progressive dementia.  Past medical and surgical history: Includes essential hypertension, history of chronic anemia, history of tobacco abuse, diabetes with CKD, dyslipidemia, history of ovarian cancer, and COPD. Surgical procedures include hip replacement in 2017 with subsequent  revision the same year; TAH; esophageal dilation, and cholecystectomy.  Social history: Nondrinker; over 50-pack-year history of smoking.  Family history: Reviewed; strong family history of diabetes.   Review of systems: Clinical neurocognitive deficits made validity of responses questionable. Date given as "February?,  [birth year]".  She cannot name the POTUS and cannot tell me why she had been in the hospital.  In fact she had no answer but simply laughed. She did not realize that she had actually fallen. When asked how she was doing the response was "pretty good".  When I specifically asked about pain , her response was "a little bit, mostly in my arm and back".  Physical exam:  Pertinent or positive findings:She is obese. She lies in bed with minimal spontaneous movement. When she attempts to change position,she flinches, describing pain in the right upper extremity . Complete dentures are present.  Breath sounds are decreased but she has expiratory rhonchorous breath sounds.  Heart sounds are markedly distant, heard best in the epigastrium.  Abdomen is protuberant.  Pedal pulses are palpable.  Right lower extremity is in a brace.  Right upper extremity is in a sling.  There is a well-healed operative scar visible over the left knee.  Right knee cannot be visualized because of the brace.  General appearance: no acute distress, increased work of breathing is present.   Lymphatic: No lymphadenopathy about the head, neck, axilla. Eyes: No conjunctival inflammation or lid edema is present. There is no scleral icterus. Ears:  External ear exam shows no significant lesions or deformities.   Nose:  External nasal examination shows no deformity or inflammation. Nasal mucosa are pink and moist without lesions, exudates Oral exam: Lips and gums are healthy appearing. Neck:  No  thyromegaly, masses, tenderness noted.    Heart:  No gallop, murmur, click, rub.  Lungs:  without wheezes, rales, rubs. Abdomen: Bowel  sounds are normal.  Abdomen is soft and nontender with no organomegaly, hernias, masses. GU: Deferred  Extremities:  No cyanosis, clubbing, edema. Neurologic exam:  Balance, Rhomberg, finger to nose testing could not be completed due to clinical state Skin: Warm & dry w/o tenting. No significant lesions or rash.  See clinical summary under each active problem in the Problem List with associated updated therapeutic plan

## 2021-01-26 NOTE — Assessment & Plan Note (Addendum)
PT/OT at SNF as tolerated with [facility] F/U as scheduled

## 2021-01-26 NOTE — Patient Instructions (Signed)
See assessment and plan under each diagnosis in the problem list and acutely for this visit 

## 2021-01-26 NOTE — Assessment & Plan Note (Addendum)
Current H/H 10/31.9.  No bleeding dyscrasias reported at the SNF. Major component probably CKD Stage 4

## 2021-01-26 NOTE — Assessment & Plan Note (Addendum)
Current creatinine 2.20/GFR 24 compatible with CKD Stage 4 No nephrotoxic meds on active list

## 2021-01-27 ENCOUNTER — Encounter: Payer: Self-pay | Admitting: Adult Health

## 2021-01-27 ENCOUNTER — Non-Acute Institutional Stay (SKILLED_NURSING_FACILITY): Payer: Medicare Other | Admitting: Adult Health

## 2021-01-27 ENCOUNTER — Other Ambulatory Visit: Payer: Self-pay | Admitting: Adult Health

## 2021-01-27 DIAGNOSIS — S42391K Other fracture of shaft of right humerus, subsequent encounter for fracture with nonunion: Secondary | ICD-10-CM

## 2021-01-27 MED ORDER — OXYCODONE HCL 5 MG PO TABS: 5.0000 mg | ORAL_TABLET | Freq: Four times a day (QID) | ORAL | 0 refills | Status: AC | PRN

## 2021-01-27 NOTE — Progress Notes (Signed)
Location:  [facility] Room Number: 793-J Place of Service:  SNF (31)   CODE STATUS: DNR  Allergies  Allergen Reactions  . Gabapentin Other (See Comments)    suicidal thoughts  . Ibuprofen Other (See Comments)    Kidney problems per caregiver    Chief Complaint  Patient presents with  . 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 Her oxycodone was stopped yesterday. She continues to have significant pain.the pain is interfering in her ability to participate in therapy. The staff would like for her pain medication to be reinstated.   Past Medical History:  Diagnosis Date  . Cancer ([place])    ovarian  . Carpal tunnel syndrome    Nocturnal and positional, bilateral  . COPD (chronic obstructive pulmonary disease) ([place])   . Degenerative joint disease   . Diabetes mellitus    Insulin-dependent diabetes  . Hypercholesteremia   . Hypertension     Past Surgical History:  Procedure Laterality Date  . ABDOMINAL HYSTERECTOMY     BSO  . ABDOMINAL SURGERY     for bleeding after hysterectomy  . CATARACT EXTRACTION W/PHACO Left 07/19/2018   Procedure: CATARACT EXTRACTION PHACO AND INTRAOCULAR LENS PLACEMENT (IOC) LEFT CDE:3.26;  Surgeon: Baruch Goldmann, MD;  Location: [facility];  Service: Ophthalmology;  Laterality: Left;  . CATARACT EXTRACTION W/PHACO Right 08/23/2018   Procedure: CATARACT EXTRACTION PHACO AND INTRAOCULAR LENS PLACEMENT RIGHT EYE;  Surgeon: Baruch Goldmann, MD;  Location: [facility];  Service: Ophthalmology;  Laterality: Right;  right  . CHOLECYSTECTOMY    . COLONOSCOPY  2007   Dr. Gala Romney: internal hemorrhoids, few scattered sigmoid  diverticula  . COLONOSCOPY N/A 04/09/2014   [id number] diverticulosis. Redundant colon. Single colonic polyp-removed as described above.  . ESOPHAGOGASTRODUODENOSCOPY  2008   Dr. Gala Romney: erosive esophagitis, patulous EG junction  . ESOPHAGOGASTRODUODENOSCOPY N/A 04/09/2014   [id number]:[id number] reflux esophagitis. Schatzki's ring-status post dilation as described above. Hiatal hernia. Gastricerosions-status post gastric biopsy  . HIP CLOSED REDUCTION Left 01/15/2016   Procedure: CLOSED REDUCTION LEFT HIP PROSTHESIS;  Surgeon: Carole Civil, MD;  Location: [facility];  Service: Orthopedics;  Laterality: Left;  . left hip replacement  12/08/2015  . MALONEY DILATION N/A 04/09/2014   Procedure: Venia Minks DILATION;  Surgeon: Daneil Dolin, MD;  Location: [facility];  Service: Endoscopy;  Laterality: N/A;  . REPLACEMENT TOTAL KNEE     bilateral  . SAVORY DILATION N/A 04/09/2014   Procedure: SAVORY DILATION;  Surgeon: Daneil Dolin, MD;  [facility]: [facility];  Service: Endoscopy;  Laterality: N/A;  . TOENAIL EXCISION     R great toenail; artificial nail  . TOTAL HIP ARTHROPLASTY Left 12/08/2015   Procedure: TOTAL HIP ARTHROPLASTY;  Surgeon: Carole Civil, MD;  Location: [facility];  Service: Orthopedics;  Laterality: Left;  . TOTAL HIP REVISION Left 01/26/2016   Procedure: LEFT TOTAL HIP REVISION;  Surgeon: Carole Civil, MD;  Location: [facility];  Service: Orthopedics;  Laterality: Left;    Social History   Socioeconomic History  . Marital status: Widowed    Spouse name: Not on file  . Number of children: Not on file  .  Years of education: Not on file  . Highest education level: Not on file  Occupational History  . Not on file  Tobacco Use  . Smoking status: Former Smoker    Packs/day: 1.00    Years: 54.00    Pack years: 54.00    Types: Cigarettes    Quit date: 01/17/2016    Years since quitting: 5.0  . Smokeless tobacco: Never Used  . Tobacco comment: Smoked [phone] up to 1 pack per  day; she did stop  9 months during pregnancy  Vaping Use  . Vaping Use: Never used  Substance and Sexual Activity  . Alcohol use: No    Alcohol/week: 0.0 standard drinks  . Drug use: No  . Sexual activity: Never    Birth control/protection: Surgical  Other Topics Concern  . Not on file  Social History Narrative  . Not on file   Social Determinants of Health   Financial Resource Strain: Not on file  Food Insecurity: Not on file  Transportation Needs: Not on file  Physical Activity: Not on file  Stress: Not on file  Social Connections: Not on file  Intimate Partner Violence: Not on file   Family History  Problem Relation Age of Onset  . Stroke Mother   . Diabetes Mother   . Diabetes Father   . Cancer Brother        Leukemia and bone cancer  . Colon cancer Neg Hx   . Heart disease Neg Hx       VITAL SIGNS BP 130/82   Pulse (!) 56   Temp 98.2 F (36.8 C)   Resp 20   Ht 5\' 7"  (1.702 m)   Wt 192 lb 9.6 oz (87.4 kg)   SpO2 99%   BMI 30.17 kg/m   Outpatient Encounter Medications as of 01/27/2021  Medication Sig  . ALPRAZolam (XANAX) 0.5 MG tablet Take 1 tablet (0.5 mg total) by mouth 2 (two) times daily as needed for anxiety.  Marland Kitchen aspirin EC 81 MG tablet Take 81 mg by mouth daily.  Roseanne Kaufman [place]-Castor Oil (VENELEX) OINT Apply 1 applicator topically 3 (three) times daily. Apply to bilateral buttocks every shift & PRN for excoriations  . donepezil (ARICEPT) 5 MG tablet Take 5 mg by mouth daily.  . feeding supplement (ENSURE ENLIVE / ENSURE PLUS) LIQD Take 237 mLs by mouth 2 (two) times daily between meals.  . hydrALAZINE (APRESOLINE) 25 MG tablet Take 25 mg by mouth 3 (three) times daily.  . insulin degludec (TRESIBA) 100 UNIT/ML FlexTouch Pen Inject 9 Units into the skin at bedtime.  . methocarbamol (ROBAXIN) 500 MG tablet Take 500 mg by mouth as needed for muscle spasms (Every 8 hours).  . Multiple Vitamins-Minerals (THEREMS-M) TABS Take 400 mcg by mouth daily.  .  nicotine (NICODERM CQ - DOSED IN MG/24 HOURS) 21 mg/24hr patch Place 21 mg onto the skin daily.  . NON FORMULARY Diet: NAS,Consistent Carbohydrate  . rosuvastatin (CRESTOR) 5 MG tablet Take 5 mg by mouth daily.  . sertraline (ZOLOFT) 50 MG tablet Take 50 mg by mouth daily.  . verapamil (CALAN-SR) 240 MG CR tablet Take 240 mg by mouth daily.   . [DISCONTINUED] methocarbamol (ROBAXIN) 500 MG tablet Take 1.5 tablets (750 mg total) by mouth every 8 (eight) hours as needed for muscle spasms. (Patient taking differently: Take 500 mg by mouth every 8 (eight) hours as needed for muscle spasms.)  . atenolol (TENORMIN) 25 MG tablet Take 1 tablet (25 mg  total) by mouth daily.  . [DISCONTINUED] Multiple Vitamin (MULTIVITAMIN WITH MINERALS) TABS tablet Take 1 tablet by mouth daily.  . [DISCONTINUED] oxyCODONE (ROXICODONE) 5 MG immediate release tablet Take 1 tablet (5 mg total) by mouth every 8 (eight) hours as needed for breakthrough pain.   No facility-administered encounter medications on file as of 01/27/2021.     SIGNIFICANT DIAGNOSTIC EXAMS  PREVIOUS   01-08-21: chest x-ray: Stable mild cardiomegaly. No active lung disease.   01-08-21: right shoulder x-ray:  1. No acute fracture or subluxation of the right shoulder. 2. Mild acromioclavicular and glenohumeral degenerative change. Small subacromial spur. 3. Subcortical cystic change in the lateral humeral head suggests underlying rotator cuff arthropathy.  01-08-21: right elbow x-ray: Supracondylar distal humeral fracture with a joint effusion   01-08-21: right wrist x-ray: There is no evidence of fracture or dislocation. There is no evidence of arthropathy or other focal bone abnormality. Soft tissues are unremarkable  01-08-21: right knee x-ray: Comminuted fracture of the distal lateral and medial femur. Fracture lines extend to the femoral component of the knee replacement. Hemarthrosis consistent with the distal femoral fracture.  NO NEW EXAMS.     LABS REVIEWED PREVIOUS   01-08-21: wbc 9.0; hgb 10.8; hct 32.2; mcv 92.3 plt 269; glucose 121; bun 34; creat 2.86; k+ 2.3; na++ 134; ca 8.1; GFR 17; mag 1.3; hgb a1c 6.3 01-09-21: mag 1.4 01-10-21: wbc 11.4; hgb 10.8; hct 33.4; mcv 95.2 plt 279; glucose 129; bun 34; creat 2.56; k+ 3.9; na++ 137; ca 9.0 GFR 20 01-14-21: wbc 9.2; hgb 11.2; hct 35.0; mcv 94.9 plt 292; glucose 98; bun 38; creat 2.19; k+ 4.3; na++ 139; ca 9.3 GFR 24 01-17-21: glucose 119; bun 48; creat 2.64; k+ 4.2; na++ 138; ca 8.7; GFR 19; mag 1.9 01-21-21: wbc 9.6; hgb 10.0; hct 31.9; mcv 96.4 plt 335; glucose 130; bun 44; creat 2.20; k+ 3.8; na++ 141; ca 9.3 GFR 24; mag 2.0 ammonia 22  NO NEW LABS.   Review of Systems  Constitutional: Negative for malaise/fatigue.  Respiratory: Negative for cough and shortness of breath.   Cardiovascular: Negative for chest pain, palpitations and leg swelling.  Gastrointestinal: Negative for abdominal pain, constipation and heartburn.  Musculoskeletal: Positive for joint pain. Negative for back pain and myalgias.       Right lower extremity pain  Right upper extremity pain   Skin: Negative.   Neurological: Negative for dizziness.  Psychiatric/Behavioral: The patient is not nervous/anxious.      Physical Exam Constitutional:      General: She is not in acute distress.    Appearance: She is well-developed. She is obese. She is not diaphoretic.  Neck:     Thyroid: No thyromegaly.  Cardiovascular:     Rate and Rhythm: Normal rate and regular rhythm.     Pulses: Normal pulses.     Heart sounds: Normal heart sounds.  Pulmonary:     Effort: Pulmonary effort is normal. No respiratory distress.     Breath sounds: Normal breath sounds.  Abdominal:     General: Bowel sounds are normal. There is no distension.     Palpations: Abdomen is soft.     Tenderness: There is no abdominal tenderness.  Musculoskeletal:     Cervical back: Neck supple.     Right lower leg: No edema.     Left lower  leg: No edema.     Comments:  Right arm in ace Right knee splint in place Is able to move  all extremities    Lymphadenopathy:     Cervical: No cervical adenopathy.  Skin:    General: Skin is warm and dry.  Neurological:     Mental Status: She is alert and oriented to person, place, and time.  Psychiatric:        Mood and Affect: Mood normal.      ASSESSMENT/ PLAN:  TODAY  1. 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 Will monitor her status.    Ok Edwards NP [facility]  Contact [phone] Monday through Friday 8am- 5pm  After hours call [phone]

## 2021-01-28 NOTE — Assessment & Plan Note (Signed)
RUE in sling  Ortho F/U as scheduled

## 2021-02-03 ENCOUNTER — Non-Acute Institutional Stay (SKILLED_NURSING_FACILITY): Payer: Medicare Other | Admitting: Adult Health

## 2021-02-03 ENCOUNTER — Encounter: Payer: Self-pay | Admitting: Adult Health

## 2021-02-03 DIAGNOSIS — S42391K Other fracture of shaft of right humerus, subsequent encounter for fracture with nonunion: Secondary | ICD-10-CM

## 2021-02-03 DIAGNOSIS — S7291XG Unspecified fracture of right femur, subsequent encounter for closed fracture with delayed healing: Secondary | ICD-10-CM

## 2021-02-03 DIAGNOSIS — I1310 Hypertensive heart and chronic kidney disease without heart failure, with stage 1 through stage 4 chronic kidney disease, or unspecified chronic kidney disease: Secondary | ICD-10-CM | POA: Diagnosis not present

## 2021-02-03 DIAGNOSIS — N184 Chronic kidney disease, stage 4 (severe): Secondary | ICD-10-CM | POA: Diagnosis not present

## 2021-02-03 DIAGNOSIS — I700 Atherosclerosis of aorta: Secondary | ICD-10-CM | POA: Diagnosis not present

## 2021-02-03 NOTE — Progress Notes (Signed)
Location:  [place] Room Number: 630 Place of Service:  SNF (31)   CODE STATUS: dnr  Allergies  Allergen Reactions  . Gabapentin Other (See Comments)    suicidal thoughts  . Ibuprofen Other (See Comments)    Kidney problems per caregiver    Chief Complaint  Patient presents with  . Acute Visit    Care plan meeting.     HPI:  We have come together for her care plan meeting. family present    No BIMS mood 0/30. Has had episodes of crying present. There have been no falls. She is extensive assist to dependent with her adls. She is a [employer] fast stream[employer] for transfers. She is incontinent of bladder and bowel. Her cbg readings are stable. She is NWB on right upper and lower extremities. Right leg in immobilizer and right arm in sling. Therapy she is dependent with bathing; transfers bed mobility; unable to ambulate    Weight poor appetite; gets ensure twice daily fair intake; lost to 190 pounds . 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 Cancer ([place])    ovarian  . Carpal tunnel syndrome    Nocturnal and positional, bilateral  . COPD (chronic obstructive pulmonary disease) ([place])   . Degenerative joint disease   . Diabetes mellitus    Insulin-dependent diabetes  . Hypercholesteremia   . Hypertension     Past Surgical History:  Procedure Laterality Date  . ABDOMINAL HYSTERECTOMY     BSO  . ABDOMINAL SURGERY     for bleeding after hysterectomy  . CATARACT EXTRACTION W/PHACO Left 07/19/2018   Procedure: CATARACT EXTRACTION PHACO AND INTRAOCULAR LENS  PLACEMENT (IOC) LEFT CDE:3.26;  Surgeon: Baruch Goldmann, MD;  Location: [facility];  Service: Ophthalmology;  Laterality: Left;  . CATARACT EXTRACTION W/PHACO Right 08/23/2018   Procedure: CATARACT EXTRACTION PHACO AND INTRAOCULAR LENS PLACEMENT RIGHT EYE;  Surgeon: Baruch Goldmann, MD;  Location: [facility];  Service: Ophthalmology;  Laterality: Right;  right  . CHOLECYSTECTOMY    . COLONOSCOPY  2007   Dr. Gala Romney: internal hemorrhoids, few scattered sigmoid diverticula  . COLONOSCOPY N/A 04/09/2014   [id number] diverticulosis. Redundant colon. Single colonic polyp-removed as described above.  . ESOPHAGOGASTRODUODENOSCOPY  2008   Dr. Gala Romney: erosive esophagitis, patulous EG junction  . ESOPHAGOGASTRODUODENOSCOPY N/A 04/09/2014   TDD:UKGURKY reflux esophagitis. Schatzki's ring-status post dilation as described above. Hiatal hernia. Gastricerosions-status post gastric biopsy  . HIP CLOSED REDUCTION Left 01/15/2016   Procedure: CLOSED REDUCTION LEFT HIP PROSTHESIS;  Surgeon: Carole Civil, MD;  Location: [facility];  Service: Orthopedics;  Laterality: Left;  . left hip replacement  12/08/2015  . MALONEY DILATION N/A 04/09/2014   Procedure: Venia Minks DILATION;  Surgeon: Daneil Dolin, MD;  [facility];  Service: Endoscopy;  Laterality: N/A;  . REPLACEMENT TOTAL KNEE     bilateral  . SAVORY DILATION N/A 04/09/2014   Procedure: SAVORY DILATION;  Surgeon: Daneil Dolin, MD;  [facility]: [facility];  Service: Endoscopy;  Laterality: N/A;  . TOENAIL EXCISION     R great toenail; artificial nail  .  TOTAL HIP ARTHROPLASTY Left 12/08/2015   Procedure: TOTAL HIP ARTHROPLASTY;  Surgeon: Carole Civil, MD;  Location: [facility];  Service: Orthopedics;  Laterality: Left;  . TOTAL HIP REVISION Left 01/26/2016   Procedure: LEFT TOTAL HIP REVISION;  Surgeon: Carole Civil, MD;  Location: [facility];  Service: Orthopedics;  Laterality: Left;    Social History   Socioeconomic History  . Marital status:  Widowed    Spouse name: Not on file  . Number of children: Not on file  . Years of education: Not on file  . Highest education level: Not on file  Occupational History  . Not on file  Tobacco Use  . Smoking status: Former Smoker    Packs/day: 1.00    Years: 54.00    Pack years: 54.00    Types: Cigarettes    Quit date: 01/17/2016    Years since quitting: 5.0  . Smokeless tobacco: Never Used  . Tobacco comment: Smoked [phone] up to 1 pack per day; she did stop  9 months during pregnancy  Vaping Use  . Vaping Use: Never used  Substance and Sexual Activity  . Alcohol use: No    Alcohol/week: 0.0 standard drinks  . Drug use: No  . Sexual activity: Never    Birth control/protection: Surgical  Other Topics Concern  . Not on file  Social History Narrative  . Not on file   Social Determinants of Health   Financial Resource Strain: Not on file  Food Insecurity: Not on file  Transportation Needs: Not on file  Physical Activity: Not on file  Stress: Not on file  Social Connections: Not on file  Intimate Partner Violence: Not on file   Family History  Problem Relation Age of Onset  . Stroke Mother   . Diabetes Mother   . Diabetes Father   . Cancer Brother        Leukemia and bone cancer  . Colon cancer Neg Hx   . Heart disease Neg Hx       VITAL SIGNS BP 137/83   Pulse 66   Temp (!) 97.3 F (36.3 C)   Ht 5\' 7"  (1.702 m)   Wt 192 lb 9.6 oz (87.4 kg)   BMI 30.17 kg/m   Outpatient Encounter Medications as of 02/03/2021  Medication Sig  . ALPRAZolam (XANAX) 0.5 MG tablet Take 1 tablet (0.5 mg total) by mouth 2 (two) times daily as needed for anxiety.  Marland Kitchen aspirin EC 81 MG tablet Take 81 mg by mouth daily.  Marland Kitchen atenolol (TENORMIN) 25 MG tablet Take 1 tablet (25 mg total) by mouth daily.  Roseanne Kaufman [place]-Castor Oil (VENELEX) OINT Apply 1 applicator topically 3 (three) times daily. Apply to bilateral buttocks every shift & PRN for excoriations  . donepezil (ARICEPT) 5 MG  tablet Take 5 mg by mouth daily.  . feeding supplement (ENSURE ENLIVE / ENSURE PLUS) LIQD Take 237 mLs by mouth 2 (two) times daily between meals.  . hydrALAZINE (APRESOLINE) 25 MG tablet Take 25 mg by mouth 3 (three) times daily.  . insulin degludec (TRESIBA) 100 UNIT/ML FlexTouch Pen Inject 9 Units into the skin at bedtime.  . methocarbamol (ROBAXIN) 500 MG tablet Take 500 mg by mouth as needed for muscle spasms (Every 8 hours).  . Multiple Vitamins-Minerals (THEREMS-M) TABS Take 400 mcg by mouth daily.  . nicotine (NICODERM CQ - DOSED IN MG/24 HOURS) 21 mg/24hr patch Place 21 mg onto the skin daily.  . NON FORMULARY Diet:  NAS,Consistent Carbohydrate  . oxyCODONE (OXY IR/ROXICODONE) 5 MG immediate release tablet Take 1 tablet (5 mg total) by mouth every 6 (six) hours as needed for up to 7 days for severe pain.  . rosuvastatin (CRESTOR) 5 MG tablet Take 5 mg by mouth daily.  . sertraline (ZOLOFT) 50 MG tablet Take 50 mg by mouth daily.  . verapamil (CALAN-SR) 240 MG CR tablet Take 240 mg by mouth daily.    No facility-administered encounter medications on file as of 02/03/2021.     SIGNIFICANT DIAGNOSTIC EXAMS  PREVIOUS   01-08-21: chest x-ray: Stable mild cardiomegaly. No active lung disease.   01-08-21: right shoulder x-ray:  1. No acute fracture or subluxation of the right shoulder. 2. Mild acromioclavicular and glenohumeral degenerative change. Small subacromial spur. 3. Subcortical cystic change in the lateral humeral head suggests underlying rotator cuff arthropathy.  01-08-21: right elbow x-ray: Supracondylar distal humeral fracture with a joint effusion   01-08-21: right wrist x-ray: There is no evidence of fracture or dislocation. There is no evidence of arthropathy or other focal bone abnormality. Soft tissues are unremarkable  01-08-21: right knee x-ray: Comminuted fracture of the distal lateral and medial femur. Fracture lines extend to the femoral component of the knee  replacement. Hemarthrosis consistent with the distal femoral fracture.  NO NEW EXAMS.    LABS REVIEWED PREVIOUS   01-08-21: wbc 9.0; hgb 10.8; hct 32.2; mcv 92.3 plt 269; glucose 121; bun 34; creat 2.86; k+ 2.3; na++ 134; ca 8.1; GFR 17; mag 1.3; hgb a1c 6.3 01-09-21: mag 1.4 01-10-21: wbc 11.4; hgb 10.8; hct 33.4; mcv 95.2 plt 279; glucose 129; bun 34; creat 2.56; k+ 3.9; na++ 137; ca 9.0 GFR 20 01-14-21: wbc 9.2; hgb 11.2; hct 35.0; mcv 94.9 plt 292; glucose 98; bun 38; creat 2.19; k+ 4.3; na++ 139; ca 9.3 GFR 24 01-17-21: glucose 119; bun 48; creat 2.64; k+ 4.2; na++ 138; ca 8.7; GFR 19; mag 1.9 01-21-21: wbc 9.6; hgb 10.0; hct 31.9; mcv 96.4 plt 335; glucose 130; bun 44; creat 2.20; k+ 3.8; na++ 141; ca 9.3 GFR 24; mag 2.0 ammonia 22  NO NEW LABS.   Review of Systems  Constitutional: Negative for malaise/fatigue.  Respiratory: Negative for cough and shortness of breath.   Cardiovascular: Negative for chest pain, palpitations and leg swelling.  Gastrointestinal: Negative for abdominal pain, constipation and heartburn.  Musculoskeletal: Positive for joint pain. Negative for back pain and myalgias.       Right upper and lower extremity pain   Skin: Negative.   Neurological: Negative for dizziness.  Psychiatric/Behavioral: The patient is not nervous/anxious.    .   Physical Exam Constitutional:      General: She is not in acute distress.    Appearance: She is well-developed. She is obese. She is not diaphoretic.  Neck:     Thyroid: No thyromegaly.  Cardiovascular:     Rate and Rhythm: Normal rate and regular rhythm.     Pulses: Normal pulses.     Heart sounds: Normal heart sounds.  Pulmonary:     Effort: Pulmonary effort is normal. No respiratory distress.     Breath sounds: Normal breath sounds.  Abdominal:     General: Bowel sounds are normal. There is no distension.     Palpations: Abdomen is soft.     Tenderness: There is no abdominal tenderness.  Musculoskeletal:      Cervical back: Neck supple.     Right lower leg: No edema.  Left lower leg: No edema.     Comments:  Right arm in ace/sling  Right knee splint in place Is able to move all extremities     Lymphadenopathy:     Cervical: No cervical adenopathy.  Skin:    General: Skin is warm and dry.  Neurological:     Mental Status: She is alert. Mental status is at baseline.  Psychiatric:        Mood and Affect: Mood normal.        ASSESSMENT/ PLAN:  TODAY  1. Other closed fracture of shaft of  right humerus with nonunion subsequent encounter 2. Closed fracture of right femur with delayed healing with unspecified fracture morphology unspecified portion of femur subsequent encounter 3. Aortic atherosclerosis 4. 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  Goal of care is for long term placement.   Time spent with patient: 40 minutes: coordination of therapy; goals of care; medications.    Ok Edwards NP [facility]  Contact [phone] Monday through Friday 8am- 5pm  After hours call [phone]

## 2021-02-04 ENCOUNTER — Non-Acute Institutional Stay (SKILLED_NURSING_FACILITY): Payer: Medicare Other | Admitting: Adult Health

## 2021-02-04 ENCOUNTER — Encounter: Payer: Self-pay | Admitting: Adult Health

## 2021-02-04 DIAGNOSIS — S42391K Other fracture of shaft of right humerus, subsequent encounter for fracture with nonunion: Secondary | ICD-10-CM

## 2021-02-04 DIAGNOSIS — S7291XG Unspecified fracture of right femur, subsequent encounter for closed fracture with delayed healing: Secondary | ICD-10-CM

## 2021-02-04 NOTE — Progress Notes (Signed)
Location:  [place] Room Number: 841-L Place of Service:  SNF (31)   CODE STATUS: DNR  Allergies  Allergen Reactions  . Gabapentin Other (See Comments)    suicidal thoughts  . Ibuprofen Other (See Comments)    Kidney problems per caregiver    Chief Complaint  Patient presents with  . Acute Visit    Pain    HPI:  Therapy is concerned about her pain management. She does have significant pain with movement/therapy. The pain is interfering with her quality of life and her ability to participate in therapy. They are requesting that I review her pain management.  It is time to come off narcotic pain medications. She is unable to describe the type of pain that she is having.   Past Medical History:  Diagnosis Date  . Cancer ([place])    ovarian  . Carpal tunnel syndrome    Nocturnal and positional, bilateral  . COPD (chronic obstructive pulmonary disease) ([place])   . Degenerative joint disease   . Diabetes mellitus    Insulin-dependent diabetes  . Hypercholesteremia   . Hypertension     Past Surgical History:  Procedure Laterality Date  . ABDOMINAL HYSTERECTOMY     BSO  . ABDOMINAL SURGERY     for bleeding after hysterectomy  . CATARACT EXTRACTION W/PHACO Left 07/19/2018   Procedure: CATARACT EXTRACTION PHACO AND INTRAOCULAR LENS PLACEMENT (IOC) LEFT CDE:3.26;  Surgeon: Baruch Goldmann, MD;  Location: [facility];  Service: Ophthalmology;  Laterality: Left;  . CATARACT EXTRACTION W/PHACO Right 08/23/2018   Procedure: CATARACT EXTRACTION PHACO AND INTRAOCULAR LENS PLACEMENT RIGHT EYE;  Surgeon: Baruch Goldmann, MD;  Location: [facility];  Service: Ophthalmology;  Laterality: Right;  right  . CHOLECYSTECTOMY    . COLONOSCOPY  2007   Dr. Gala Romney: internal hemorrhoids, few scattered sigmoid diverticula  . COLONOSCOPY N/A 04/09/2014   KGM:WNUUVOZ diverticulosis. Redundant colon. Single colonic polyp-removed as described above.  . ESOPHAGOGASTRODUODENOSCOPY  2008   Dr.  Gala Romney: erosive esophagitis, patulous EG junction  . ESOPHAGOGASTRODUODENOSCOPY N/A 04/09/2014   DGU:[id number] reflux esophagitis. Schatzki's ring-status post dilation as described above. Hiatal hernia. Gastricerosions-status post gastric biopsy  . HIP CLOSED REDUCTION Left 01/15/2016   Procedure: CLOSED REDUCTION LEFT HIP PROSTHESIS;  Surgeon: Carole Civil, MD;  Location: [facility];  Service: Orthopedics;  Laterality: Left;  . left hip replacement  12/08/2015  . MALONEY DILATION N/A 04/09/2014   Procedure: Venia Minks DILATION;  Surgeon: Daneil Dolin, MD;  Location: [facility];  Service: Endoscopy;  Laterality: N/A;  . REPLACEMENT TOTAL KNEE     bilateral  . SAVORY DILATION N/A 04/09/2014   Procedure: SAVORY DILATION;  Surgeon: Daneil Dolin, MD;  Location: [facility];  Service: Endoscopy;  Laterality: N/A;  . TOENAIL EXCISION     R great toenail; artificial nail  . TOTAL HIP ARTHROPLASTY Left 12/08/2015   Procedure: TOTAL HIP ARTHROPLASTY;  Surgeon: Carole Civil, MD;  Location: [facility];  Service: Orthopedics;  Laterality: Left;  . TOTAL HIP REVISION Left 01/26/2016   Procedure: LEFT TOTAL HIP REVISION;  Surgeon: Carole Civil, MD;  Location: [facility];  Service: Orthopedics;  Laterality: Left;    Social History   Socioeconomic History  . Marital status: Widowed    Spouse name: Not on file  . Number of children: Not on file  . Years of education: Not on file  . Highest education level: Not on file  Occupational History  . Not on  file  Tobacco Use  . Smoking status: Former Smoker    Packs/day: 1.00    Years: 54.00    Pack years: 54.00    Types: Cigarettes    Quit date: 01/17/2016    Years since quitting: 5.0  . Smokeless tobacco: Never Used  . Tobacco comment: Smoked [phone] up to 1 pack per day; she did stop  9 months during pregnancy  Vaping Use  . Vaping Use: Never used  Substance and Sexual Activity  . Alcohol use: No    Alcohol/week: 0.0 standard drinks  . Drug  use: No  . Sexual activity: Never    Birth control/protection: Surgical  Other Topics Concern  . Not on file  Social History Narrative  . Not on file   Social Determinants of Health   Financial Resource Strain: Not on file  Food Insecurity: Not on file  Transportation Needs: Not on file  Physical Activity: Not on file  Stress: Not on file  Social Connections: Not on file  Intimate Partner Violence: Not on file   Family History  Problem Relation Age of Onset  . Stroke Mother   . Diabetes Mother   . Diabetes Father   . Cancer Brother        Leukemia and bone cancer  . Colon cancer Neg Hx   . Heart disease Neg Hx       VITAL SIGNS BP (!) 178/80   Pulse (!) 56   Temp (!) 97.1 F (36.2 C)   Resp 20   Ht 5\' 7"  (1.702 m)   Wt 192 lb 9.6 oz (87.4 kg)   SpO2 93%   BMI 30.17 kg/m   Outpatient Encounter Medications as of 02/04/2021  Medication Sig  . aspirin EC 81 MG tablet Take 81 mg by mouth daily.  Roseanne Kaufman Peru-Castor Oil (VENELEX) OINT Apply 1 applicator topically 3 (three) times daily. Apply to bilateral buttocks every shift & PRN for excoriations  . donepezil (ARICEPT) 5 MG tablet Take 5 mg by mouth daily.  . feeding supplement (ENSURE ENLIVE / ENSURE PLUS) LIQD Take 237 mLs by mouth 2 (two) times daily between meals.  . hydrALAZINE (APRESOLINE) 25 MG tablet Take 25 mg by mouth 3 (three) times daily.  . insulin degludec (TRESIBA) 100 UNIT/ML FlexTouch Pen Inject 9 Units into the skin at bedtime.  . methocarbamol (ROBAXIN) 500 MG tablet Take 500 mg by mouth as needed for muscle spasms (Every 8 hours).  . Multiple Vitamins-Minerals (THEREMS-M) TABS Take 400 mcg by mouth daily.  . nicotine (NICODERM CQ - DOSED IN MG/24 HOURS) 21 mg/24hr patch Place 21 mg onto the skin daily.  . NON FORMULARY Diet: NAS,Consistent Carbohydrate  . rosuvastatin (CRESTOR) 5 MG tablet Take 5 mg by mouth daily.  . sertraline (ZOLOFT) 50 MG tablet Take 50 mg by mouth daily.  . verapamil  (CALAN-SR) 240 MG CR tablet Take 240 mg by mouth daily.   Marland Kitchen ALPRAZolam (XANAX) 0.5 MG tablet Take 1 tablet (0.5 mg total) by mouth 2 (two) times daily as needed for anxiety.  Marland Kitchen atenolol (TENORMIN) 25 MG tablet Take 1 tablet (25 mg total) by mouth daily.   No facility-administered encounter medications on file as of 02/04/2021.     SIGNIFICANT DIAGNOSTIC EXAMS   PREVIOUS   01-08-21: chest x-ray: Stable mild cardiomegaly. No active lung disease.   01-08-21: right shoulder x-ray:  1. No acute fracture or subluxation of the right shoulder. 2. Mild acromioclavicular and glenohumeral degenerative change.  Small subacromial spur. 3. Subcortical cystic change in the lateral humeral head suggests underlying rotator cuff arthropathy.  01-08-21: right elbow x-ray: Supracondylar distal humeral fracture with a joint effusion   01-08-21: right wrist x-ray: There is no evidence of fracture or dislocation. There is no evidence of arthropathy or other focal bone abnormality. Soft tissues are unremarkable  01-08-21: right knee x-ray: Comminuted fracture of the distal lateral and medial femur. Fracture lines extend to the femoral component of the knee replacement. Hemarthrosis consistent with the distal femoral fracture.  NO NEW EXAMS.    LABS REVIEWED PREVIOUS   01-08-21: wbc 9.0; hgb 10.8; hct 32.2; mcv 92.3 plt 269; glucose 121; bun 34; creat 2.86; k+ 2.3; na++ 134; ca 8.1; GFR 17; mag 1.3; hgb a1c 6.3 01-09-21: mag 1.4 01-10-21: wbc 11.4; hgb 10.8; hct 33.4; mcv 95.2 plt 279; glucose 129; bun 34; creat 2.56; k+ 3.9; na++ 137; ca 9.0 GFR 20 01-14-21: wbc 9.2; hgb 11.2; hct 35.0; mcv 94.9 plt 292; glucose 98; bun 38; creat 2.19; k+ 4.3; na++ 139; ca 9.3 GFR 24 01-17-21: glucose 119; bun 48; creat 2.64; k+ 4.2; na++ 138; ca 8.7; GFR 19; mag 1.9 01-21-21: wbc 9.6; hgb 10.0; hct 31.9; mcv 96.4 plt 335; glucose 130; bun 44; creat 2.20; k+ 3.8; na++ 141; ca 9.3 GFR 24; mag 2.0 ammonia 22  NO NEW LABS.   Review of  Systems  Constitutional: Negative for malaise/fatigue.  Respiratory: Negative for cough and shortness of breath.   Cardiovascular: Negative for chest pain, palpitations and leg swelling.  Gastrointestinal: Negative for abdominal pain, constipation and heartburn.  Musculoskeletal: Positive for joint pain. Negative for back pain and myalgias.       Right upper and lower extremity pain   Skin: Negative.   Neurological: Negative for dizziness.  Psychiatric/Behavioral: The patient is not nervous/anxious.     Physical Exam Constitutional:      General: She is not in acute distress.    Appearance: She is well-developed. She is not diaphoretic.  Neck:     Thyroid: No thyromegaly.  Cardiovascular:     Rate and Rhythm: Normal rate and regular rhythm.     Pulses: Normal pulses.     Heart sounds: Normal heart sounds.  Pulmonary:     Effort: Pulmonary effort is normal. No respiratory distress.     Breath sounds: Normal breath sounds.  Abdominal:     General: Bowel sounds are normal. There is no distension.     Palpations: Abdomen is soft.     Tenderness: There is no abdominal tenderness.  Musculoskeletal:     Cervical back: Neck supple.     Right lower leg: No edema.     Left lower leg: No edema.     Comments: Right arm in ace/sling  Right knee splint in place Is able to move all extremities      Lymphadenopathy:     Cervical: No cervical adenopathy.  Skin:    General: Skin is warm and dry.  Neurological:     Mental Status: She is alert. Mental status is at baseline.  Psychiatric:        Mood and Affect: Mood normal.       ASSESSMENT/ PLAN:  TODAY  1. 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 Closed fracture of right femur with delayed healing unspecified fracture morphology unspecified portion of femur subsequent encounter  Will stop xanax due to nonuse Will begin tylenol cr 650  mg every 6 hours Will begin robaxin 500 mg daily  prior to therapy.  Will monitor her status.    Ok Edwards NP [facility]  Contact [phone] Monday through Friday 8am- 5pm  After hours call [phone]

## 2021-02-09 ENCOUNTER — Other Ambulatory Visit: Payer: Self-pay

## 2021-02-09 ENCOUNTER — Inpatient Hospital Stay (HOSPITAL_COMMUNITY)
Admission: EM | Admit: 2021-02-09 | Discharge: 2021-02-10 | DRG: 951 | Disposition: A | Discharge status: Hospice | Payer: Medicare Other | Source: Skilled Nursing Facility | Attending: Internal Medicine | Admitting: Internal Medicine

## 2021-02-09 ENCOUNTER — Encounter: Payer: Self-pay | Admitting: Adult Health

## 2021-02-09 ENCOUNTER — Encounter (HOSPITAL_COMMUNITY): Payer: Self-pay | Admitting: Internal Medicine

## 2021-02-09 ENCOUNTER — Non-Acute Institutional Stay (SKILLED_NURSING_FACILITY): Payer: Medicare Other | Admitting: Adult Health

## 2021-02-09 ENCOUNTER — Emergency Department (HOSPITAL_COMMUNITY): Payer: Medicare Other

## 2021-02-09 DIAGNOSIS — Z833 Family history of diabetes mellitus: Secondary | ICD-10-CM

## 2021-02-09 DIAGNOSIS — N184 Chronic kidney disease, stage 4 (severe): Secondary | ICD-10-CM | POA: Diagnosis present

## 2021-02-09 DIAGNOSIS — Z96642 Presence of left artificial hip joint: Secondary | ICD-10-CM | POA: Diagnosis not present

## 2021-02-09 DIAGNOSIS — Z8543 Personal history of malignant neoplasm of ovary: Secondary | ICD-10-CM | POA: Diagnosis not present

## 2021-02-09 DIAGNOSIS — E7800 Pure hypercholesterolemia, unspecified: Secondary | ICD-10-CM | POA: Diagnosis not present

## 2021-02-09 DIAGNOSIS — Z79899 Other long term (current) drug therapy: Secondary | ICD-10-CM

## 2021-02-09 DIAGNOSIS — Z743 Need for continuous supervision: Secondary | ICD-10-CM | POA: Diagnosis not present

## 2021-02-09 DIAGNOSIS — Z6830 Body mass index (BMI) 30.0-30.9, adult: Secondary | ICD-10-CM | POA: Diagnosis not present

## 2021-02-09 DIAGNOSIS — Z9842 Cataract extraction status, left eye: Secondary | ICD-10-CM

## 2021-02-09 DIAGNOSIS — Z7189 Other specified counseling: Secondary | ICD-10-CM

## 2021-02-09 DIAGNOSIS — I613 Nontraumatic intracerebral hemorrhage in brain stem: Principal | ICD-10-CM | POA: Diagnosis present

## 2021-02-09 DIAGNOSIS — I10 Essential (primary) hypertension: Secondary | ICD-10-CM | POA: Diagnosis not present

## 2021-02-09 DIAGNOSIS — F0390 Unspecified dementia without behavioral disturbance: Secondary | ICD-10-CM | POA: Diagnosis present

## 2021-02-09 DIAGNOSIS — Z515 Encounter for palliative care: Secondary | ICD-10-CM | POA: Diagnosis not present

## 2021-02-09 DIAGNOSIS — G8104 Flaccid hemiplegia affecting left nondominant side: Secondary | ICD-10-CM

## 2021-02-09 DIAGNOSIS — W1830XD Fall on same level, unspecified, subsequent encounter: Secondary | ICD-10-CM | POA: Diagnosis not present

## 2021-02-09 DIAGNOSIS — I129 Hypertensive chronic kidney disease with stage 1 through stage 4 chronic kidney disease, or unspecified chronic kidney disease: Secondary | ICD-10-CM | POA: Diagnosis present

## 2021-02-09 DIAGNOSIS — Z20822 Contact with and (suspected) exposure to covid-19: Secondary | ICD-10-CM | POA: Diagnosis present

## 2021-02-09 DIAGNOSIS — Z66 Do not resuscitate: Secondary | ICD-10-CM | POA: Diagnosis present

## 2021-02-09 DIAGNOSIS — R5381 Other malaise: Secondary | ICD-10-CM | POA: Diagnosis not present

## 2021-02-09 DIAGNOSIS — R279 Unspecified lack of coordination: Secondary | ICD-10-CM | POA: Diagnosis not present

## 2021-02-09 DIAGNOSIS — Z90710 Acquired absence of both cervix and uterus: Secondary | ICD-10-CM | POA: Diagnosis not present

## 2021-02-09 DIAGNOSIS — E1122 Type 2 diabetes mellitus with diabetic chronic kidney disease: Secondary | ICD-10-CM | POA: Diagnosis not present

## 2021-02-09 DIAGNOSIS — Z96653 Presence of artificial knee joint, bilateral: Secondary | ICD-10-CM | POA: Diagnosis present

## 2021-02-09 DIAGNOSIS — Z7982 Long term (current) use of aspirin: Secondary | ICD-10-CM

## 2021-02-09 DIAGNOSIS — Z87891 Personal history of nicotine dependence: Secondary | ICD-10-CM

## 2021-02-09 DIAGNOSIS — R4781 Slurred speech: Secondary | ICD-10-CM | POA: Diagnosis not present

## 2021-02-09 DIAGNOSIS — Z961 Presence of intraocular lens: Secondary | ICD-10-CM | POA: Diagnosis not present

## 2021-02-09 DIAGNOSIS — E669 Obesity, unspecified: Secondary | ICD-10-CM | POA: Diagnosis present

## 2021-02-09 DIAGNOSIS — J449 Chronic obstructive pulmonary disease, unspecified: Secondary | ICD-10-CM | POA: Diagnosis present

## 2021-02-09 DIAGNOSIS — E785 Hyperlipidemia, unspecified: Secondary | ICD-10-CM | POA: Diagnosis present

## 2021-02-09 DIAGNOSIS — Z7401 Bed confinement status: Secondary | ICD-10-CM | POA: Diagnosis not present

## 2021-02-09 DIAGNOSIS — Z888 Allergy status to other drugs, medicaments and biological substances status: Secondary | ICD-10-CM

## 2021-02-09 DIAGNOSIS — Z794 Long term (current) use of insulin: Secondary | ICD-10-CM

## 2021-02-09 DIAGNOSIS — I169 Hypertensive crisis, unspecified: Secondary | ICD-10-CM | POA: Diagnosis not present

## 2021-02-09 DIAGNOSIS — R29810 Facial weakness: Secondary | ICD-10-CM | POA: Diagnosis not present

## 2021-02-09 DIAGNOSIS — I4519 Other right bundle-branch block: Secondary | ICD-10-CM | POA: Diagnosis not present

## 2021-02-09 DIAGNOSIS — Z9841 Cataract extraction status, right eye: Secondary | ICD-10-CM

## 2021-02-09 DIAGNOSIS — S42401A Unspecified fracture of lower end of right humerus, initial encounter for closed fracture: Secondary | ICD-10-CM | POA: Diagnosis present

## 2021-02-09 DIAGNOSIS — W1830XA Fall on same level, unspecified, initial encounter: Secondary | ICD-10-CM | POA: Diagnosis present

## 2021-02-09 DIAGNOSIS — R0902 Hypoxemia: Secondary | ICD-10-CM | POA: Diagnosis not present

## 2021-02-09 DIAGNOSIS — R6889 Other general symptoms and signs: Secondary | ICD-10-CM | POA: Diagnosis not present

## 2021-02-09 DIAGNOSIS — R29818 Other symptoms and signs involving the nervous system: Secondary | ICD-10-CM | POA: Diagnosis not present

## 2021-02-09 DIAGNOSIS — Z823 Family history of stroke: Secondary | ICD-10-CM

## 2021-02-09 DIAGNOSIS — I69154 Hemiplegia and hemiparesis following nontraumatic intracerebral hemorrhage affecting left non-dominant side: Secondary | ICD-10-CM

## 2021-02-09 DIAGNOSIS — Z9049 Acquired absence of other specified parts of digestive tract: Secondary | ICD-10-CM

## 2021-02-09 DIAGNOSIS — S42401D Unspecified fracture of lower end of right humerus, subsequent encounter for fracture with routine healing: Secondary | ICD-10-CM

## 2021-02-09 LAB — CBG MONITORING, ED: Glucose-Capillary: 83 mg/dL (ref 70–99)

## 2021-02-09 LAB — CBC
HCT: 32.1 % — ABNORMAL LOW (ref 36.0–46.0)
Hemoglobin: 10.2 g/dL — ABNORMAL LOW (ref 12.0–15.0)
MCH: 29.8 pg (ref 26.0–34.0)
MCHC: 31.8 g/dL (ref 30.0–36.0)
MCV: 93.9 fL (ref 80.0–100.0)
Platelets: 450 10*3/uL — ABNORMAL HIGH (ref 150–400)
RBC: 3.42 MIL/uL — ABNORMAL LOW (ref 3.87–5.11)
RDW: 13 % (ref 11.5–15.5)
WBC: 9.6 10*3/uL (ref 4.0–10.5)
nRBC: 0 % (ref 0.0–0.2)

## 2021-02-09 LAB — PROTIME-INR
INR: 1 (ref 0.8–1.2)
Prothrombin Time: 13.1 seconds (ref 11.4–15.2)

## 2021-02-09 LAB — ETHANOL: Alcohol, Ethyl (B): <10 mg/dL (ref <10)

## 2021-02-09 LAB — DIFFERENTIAL
Abs Immature Granulocytes: 0.04 10*3/uL (ref 0.00–0.07)
Basophils Absolute: 0.1 10*3/uL (ref 0.0–0.1)
Basophils Relative: 1 %
Eosinophils Absolute: 0.1 10*3/uL (ref 0.0–0.5)
Eosinophils Relative: 1 %
Immature Granulocytes: 0 %
Lymphocytes Relative: 12 %
Lymphs Abs: 1.2 10*3/uL (ref 0.7–4.0)
Monocytes Absolute: 0.7 10*3/uL (ref 0.1–1.0)
Monocytes Relative: 7 %
Neutro Abs: 7.6 10*3/uL (ref 1.7–7.7)
Neutrophils Relative %: 79 %

## 2021-02-09 LAB — COMPREHENSIVE METABOLIC PANEL
ALT: 21 U/L (ref 0–44)
AST: 19 U/L (ref 15–41)
Albumin: 2.9 g/dL — ABNORMAL LOW (ref 3.5–5.0)
Alkaline Phosphatase: 204 U/L — ABNORMAL HIGH (ref 38–126)
Anion gap: 10 (ref 5–15)
BUN: 48 mg/dL — ABNORMAL HIGH (ref 8–23)
CO2: 25 mmol/L (ref 22–32)
Calcium: 9.3 mg/dL (ref 8.9–10.3)
Chloride: 100 mmol/L (ref 98–111)
Creatinine, Ser: 2.37 mg/dL — ABNORMAL HIGH (ref 0.44–1.00)
GFR, Estimated: 22 mL/min — ABNORMAL LOW (ref >60)
Glucose, Bld: 86 mg/dL (ref 70–99)
Potassium: 3.7 mmol/L (ref 3.5–5.1)
Sodium: 135 mmol/L (ref 135–145)
Total Bilirubin: 0.8 mg/dL (ref 0.3–1.2)
Total Protein: 6.3 g/dL — ABNORMAL LOW (ref 6.5–8.1)

## 2021-02-09 LAB — RESP PANEL BY RT-PCR (FLU A&B, COVID) ARPGX2
Influenza A by PCR: NEGATIVE
Influenza B by PCR: NEGATIVE
SARS Coronavirus 2 by RT PCR: NEGATIVE

## 2021-02-09 LAB — APTT: aPTT: 25 seconds (ref 24–36)

## 2021-02-09 MED ORDER — ONDANSETRON HCL 4 MG/2ML IJ SOLN: 4.0000 mg | Freq: Four times a day (QID) | INTRAMUSCULAR | Status: DC | PRN

## 2021-02-09 MED ORDER — HYDROMORPHONE BOLUS VIA INFUSION: 0.2000 mg | INTRAVENOUS | Status: DC | PRN

## 2021-02-09 MED ORDER — DEXTROMETHORPHAN POLISTIREX ER 30 MG/5ML PO SUER
30.0000 mg | Freq: Two times a day (BID) | ORAL | Status: DC | PRN
  Administered 2021-02-09: 30 mg via ORAL
  Filled 2021-02-09: qty 5

## 2021-02-09 MED ORDER — HALOPERIDOL 0.5 MG PO TABS: 0.5000 mg | ORAL_TABLET | ORAL | Status: DC | PRN

## 2021-02-09 MED ORDER — HALOPERIDOL LACTATE 2 MG/ML PO CONC: 0.5000 mg | ORAL | Status: DC | PRN

## 2021-02-09 MED ORDER — ACETAMINOPHEN 325 MG PO TABS: 650.0000 mg | ORAL_TABLET | Freq: Four times a day (QID) | ORAL | Status: DC | PRN

## 2021-02-09 MED ORDER — ONDANSETRON 4 MG PO TBDP: 4.0000 mg | ORAL_TABLET | Freq: Four times a day (QID) | ORAL | Status: DC | PRN

## 2021-02-09 MED ORDER — SODIUM CHLORIDE 0.9 % IV SOLN
0.5000 mg/h | INTRAVENOUS | Status: DC
  Administered 2021-02-09: 0.5 mg/h via INTRAVENOUS
  Filled 2021-02-09: qty 5

## 2021-02-09 MED ORDER — HALOPERIDOL LACTATE 5 MG/ML IJ SOLN: 0.5000 mg | INTRAMUSCULAR | Status: DC | PRN

## 2021-02-09 MED ORDER — GLYCOPYRROLATE 1 MG PO TABS: 1.0000 mg | ORAL_TABLET | ORAL | Status: DC | PRN

## 2021-02-09 MED ORDER — CLEVIDIPINE BUTYRATE 0.5 MG/ML IV EMUL
0.0000 mg/h | INTRAVENOUS | Status: DC
  Administered 2021-02-09: 1 mg/h via INTRAVENOUS
  Administered 2021-02-09: 6 mg/h via INTRAVENOUS
  Filled 2021-02-09: qty 50

## 2021-02-09 MED ORDER — LORAZEPAM 2 MG/ML IJ SOLN: 1.0000 mg | Freq: Four times a day (QID) | INTRAMUSCULAR | Status: DC | PRN

## 2021-02-09 MED ORDER — POLYVINYL ALCOHOL 1.4 % OP SOLN: 1.0000 [drp] | Freq: Four times a day (QID) | OPHTHALMIC | Status: DC | PRN

## 2021-02-09 MED ORDER — ACETAMINOPHEN 650 MG RE SUPP: 650.0000 mg | Freq: Four times a day (QID) | RECTAL | Status: DC | PRN

## 2021-02-09 MED ORDER — LABETALOL HCL 5 MG/ML IV SOLN
10.0000 mg | Freq: Once | INTRAVENOUS | Status: AC
  Administered 2021-02-09: 10 mg via INTRAVENOUS
  Filled 2021-02-09: qty 4

## 2021-02-09 MED ORDER — GLYCOPYRROLATE 0.2 MG/ML IJ SOLN
0.2000 mg | INTRAMUSCULAR | Status: DC | PRN
  Administered 2021-02-09 – 2021-02-10 (×2): 0.2 mg via INTRAVENOUS
  Filled 2021-02-09: qty 1

## 2021-02-09 MED ORDER — GLYCOPYRROLATE 0.2 MG/ML IJ SOLN
0.2000 mg | INTRAMUSCULAR | Status: DC | PRN
  Filled 2021-02-09: qty 1

## 2021-02-09 MED ORDER — BIOTENE DRY MOUTH MT LIQD: 15.0000 mL | OROMUCOSAL | Status: DC | PRN

## 2021-02-09 NOTE — Consult Note (Addendum)
Consultation Note Date: 02/09/2021   Patient Name: [name]  DOB: 06-Oct-1950  MRN: [id number]  Age / Sex: 70 y.o., female  PCP: [name], MD Referring Physician: Barton Dubois, MD  Reason for Consultation: Establishing goals of care and Hospice Evaluation  HPI/Patient Profile: 70 y.o. female  with past medical history of hypertension, hyperlipidemia, T2DM, dementia and obesity admitted on 02/09/2021 with stroke.   Clinical Assessment and Goals of Care: [name] is lying quietly in bed with her daughter-in-law and granddaughter at bedside.  She will make an somewhat keep eye contact, but cough the entire time I am there, and is clearly unable to manage her secretions.  I ask if she is comfortable, and after some thought she denies issues.  Family at bedside share that they are suctioning her frequently.  Daughter-in-law states that her husband, [name] son, [name], will be arriving at the hospital around 9 PM tonight.  She states repeatedly that they want to keep [name] comfortable.  We talked about symptom management and positioning.  Family talks about finding a balance between keeping [name] comfortable, but allowing her to interact with her son when he arrives tonight around 74 PM.  They share that her daughter visited from [place] last week.  Conference with attending, bedside nursing staff, transition of care team related to patient condition, needs, goals of care, end-of-life orders. End-of-life order set implemented.   HCPOA    NEXT OF KIN -son, [name].    SUMMARY OF RECOMMENDATIONS   Full comfort care We will discuss residential hospice with family tomorrow if appropriate In hospital death would not be surprising  Code Status/Advance Care Planning:  DNR  Symptom Management:   End-of-life order set implemented  Palliative Prophylaxis:    Frequent Pain Assessment and Turn Reposition  Additional Recommendations (Limitations, Scope, Preferences):  Full Comfort Care  Psycho-social/Spiritual:   Desire for further Chaplaincy support:no  Additional  Recommendations: Caregiving  Support/Resources and Education on Hospice  Prognosis:   < 2 weeks, days to weeks anticipated  Discharge Planning: To be determined, if stabilizes, recommend transition to residential hospice      Primary Diagnoses: Present on Admission: . 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 The following aspects are pertinent.  Past Medical History:  Diagnosis Date  . Cancer ([place])    ovarian  . Carpal tunnel syndrome    Nocturnal and positional, bilateral  . COPD (chronic obstructive pulmonary disease) ([place])   . Degenerative joint disease   . Diabetes mellitus    Insulin-dependent diabetes  . Hypercholesteremia   . Hypertension    Social History   Socioeconomic History  . Marital status: Widowed    Spouse name: Not on file  . Number of children: Not on file  . Years of education: Not on file  . Highest education level: Not on file  Occupational History  . Not on file  Tobacco Use  .  Smoking status: Former Smoker    Packs/day: 1.00    Years: 54.00    Pack years: 54.00    Types: Cigarettes    Quit date: 01/17/2016    Years since quitting: 5.0  . Smokeless tobacco: Never Used  . Tobacco comment: Smoked [phone] up to 1 pack per day; she did stop  9 months during pregnancy  Vaping Use  . Vaping Use: Never used  Substance and Sexual Activity  . Alcohol use: No    Alcohol/week: 0.0 standard drinks  . Drug use: No  . Sexual activity: Never    Birth control/protection: Surgical  Other Topics Concern  . Not on file  Social History Narrative  . Not on file   Social Determinants of Health   Financial Resource Strain: Not on file  Food  Insecurity: Not on file  Transportation Needs: Not on file  Physical Activity: Not on file  Stress: Not on file  Social Connections: Not on file   Family History  Problem Relation Age of Onset  . Stroke Mother   . Diabetes Mother   . Diabetes Father   . Cancer Brother        Leukemia and bone cancer  . Colon cancer Neg Hx   . Heart disease Neg Hx    Scheduled Meds: Continuous Infusions: . HYDROmorphone 0.5 mg/hr (02/09/21 1509)   PRN Meds:.acetaminophen **OR** acetaminophen, antiseptic oral rinse, glycopyrrolate **OR** glycopyrrolate **OR** glycopyrrolate, haloperidol **OR** [DISCONTINUED] haloperidol **OR** haloperidol lactate, HYDROmorphone, ondansetron **OR** ondansetron (ZOFRAN) IV, polyvinyl alcohol Medications Prior to Admission:  Prior to Admission medications   Medication Sig Start Date End Date Taking? Authorizing Provider  ALPRAZolam Duanne Moron) 0.5 MG tablet Take 1 tablet (0.5 mg total) by mouth 2 (two) times daily as needed for anxiety. 01/21/21   Manuella Ghazi, Pratik D, DO  aspirin EC 81 MG tablet Take 81 mg by mouth daily.    [provider]  atenolol (TENORMIN) 25 MG tablet Take 1 tablet (25 mg total) by mouth daily. 06/17/18 04/07/19  Arnoldo Lenis, MD  Balsam Peru-Castor Oil [facility]) OINT Apply 1 applicator topically 3 (three) times daily. Apply to bilateral buttocks every shift & PRN for excoriations    [provider]  donepezil (ARICEPT) 5 MG tablet Take 5 mg by mouth daily. 11/12/20   [provider]  feeding supplement (ENSURE ENLIVE / ENSURE PLUS) LIQD Take 237 mLs by mouth 2 (two) times daily between meals. 01/21/21   Manuella Ghazi, Pratik D, DO  hydrALAZINE (APRESOLINE) 25 MG tablet Take 25 mg by mouth 3 (three) times daily.    [provider]  insulin degludec (TRESIBA) 100 UNIT/ML FlexTouch Pen Inject 9 Units into the skin at bedtime. 01/21/21   Manuella Ghazi, Pratik D, DO  methocarbamol (ROBAXIN) 500 MG tablet Take 500 mg by mouth as needed for muscle  spasms (Every 8 hours).    [provider]  Multiple Vitamins-Minerals (THEREMS-M) TABS Take 400 mcg by mouth daily.    [provider]  nicotine (NICODERM CQ - DOSED IN MG/24 HOURS) 21 mg/24hr patch Place 21 mg onto the skin daily.    [provider]  NON FORMULARY Diet: NAS,Consistent Carbohydrate    [provider]  rosuvastatin (CRESTOR) 5 MG tablet Take 5 mg by mouth daily. 10/10/18   [provider]  sertraline (ZOLOFT) 50 MG tablet Take 50 mg by mouth daily. 11/12/20   [provider]  verapamil (CALAN-SR) 240 MG CR tablet Take 240 mg by mouth daily.  01/03/16   [provider]   Allergies  Allergen Reactions  . Gabapentin Other (See Comments)    suicidal thoughts  . Ibuprofen Other (See Comments)    Kidney problems per caregiver   Review of Systems  Unable to perform ROS: Mental status change    Physical Exam  Vital Signs: BP (!) 154/63   Pulse (!) 57   Temp (!) 97.5 F (36.4 C) (Oral)   Resp (!) 22   SpO2 93%  Pain Scale: PAINAD   Pain Score: 0-No pain   SpO2: SpO2: 93 % O2 Device:SpO2: 93 % O2 Flow Rate: .O2 Flow Rate (L/min): 0 L/min  IO: Intake/output summary:   Intake/Output Summary (Last 24 hours) at 02/09/2021 1539 Last data filed at 02/09/2021 1339 Gross per 24 hour  Intake 19.37 ml  Output --  Net 19.37 ml    LBM:   Baseline Weight:   Most recent weight:       Palliative Assessment/Data:   Flowsheet Rows   Flowsheet Row Most Recent Value  Intake Tab   Referral Department Hospitalist  Unit at Time of Referral Med/Surg Unit  Palliative Care Primary Diagnosis Neurology  Date Notified 02/09/21  Palliative Care Type New Palliative care  Reason for referral Clarify Goals of Care, End of Life Care Assistance  Date of Admission 02/09/21  Date first seen by Palliative Care 02/09/21  # of days Palliative referral response time 0 Day(s)  # of days IP prior to Palliative referral 0   Clinical Assessment   Palliative Performance Scale Score 20%  Pain Max last 24 hours Not able to report  Pain Min Last 24 hours Not able to report  Dyspnea Max Last 24 Hours Not able to report  Dyspnea Min Last 24 hours Not able to report  Psychosocial & Spiritual Assessment   Palliative Care Outcomes       Time In: 1500  Time Out: 1550 Time Total: 50 minutes  Greater than 50%  of this time was spent counseling and coordinating care related to the above assessment and plan.  Signed by: Drue Novel, NP   Please contact Palliative Medicine Team phone at [phone] for questions and concerns.  For individual provider: See Shea Evans

## 2021-02-09 NOTE — H&P (Signed)
History and Physical    [name] WPY:[id number] DOB: [birth year] DOA: 02/09/2021  PCP: [name], MD   Patient coming from: [facility]   I have personally briefly reviewed patient's old medical records in [place]  Chief Complaint: Left-sided weakness and slurred speech  HPI: [name] is a 70 y.o. female with medical history significant of history of COPD, hypertension, hyperlipidemia, dementia, insulin-dependent diabetes and history of ovarian cancer; who presented from [facility] (where  patient was admitted after experiencing mechanical fall with multiple fractures on the right side of her body) 2 acute left-sided weakness and slurred speech.  Patient was last seen normal around AM this morning.  There was no complaints of chest pain, palpitations, fever, chills, abdominal pain, dysuria, hematuria, melena, hematochezia or any other complaints.  ED Course: EKG demonstrating sinus rhythm; CT head positive for acute pontine hemorrhagic stroke.  Patient found to be in hypertensive crisis.  Case discussed with neurology who recommended transfer to ICU and [facility] if invasive and aggressive therapy wanted; discussed with family member who felt that at this moment based on further decline in patient's status will prefer to keep patient at [facility] and focus on full comfort care only.   Review of Systems: As per HPI otherwise all other systems reviewed and are negative.   Past Medical History:  Diagnosis Date  . Cancer ([place])    ovarian  . Carpal tunnel syndrome    Nocturnal and positional, bilateral  . COPD (chronic obstructive pulmonary disease) ([place])   . Degenerative joint disease   . Diabetes mellitus    Insulin-dependent diabetes  . Hypercholesteremia   . Hypertension     Past Surgical History:  Procedure Laterality Date  . ABDOMINAL HYSTERECTOMY     BSO  . ABDOMINAL SURGERY     for bleeding after hysterectomy  . CATARACT EXTRACTION W/PHACO  Left 07/19/2018   Procedure: CATARACT EXTRACTION PHACO AND INTRAOCULAR LENS PLACEMENT (IOC) LEFT CDE:3.26;  Surgeon: Baruch Goldmann, MD;  Location: [facility] ORS;  Service: Ophthalmology;  Laterality: Left;  . CATARACT EXTRACTION W/PHACO Right 08/23/2018   Procedure: CATARACT EXTRACTION PHACO AND INTRAOCULAR LENS PLACEMENT RIGHT EYE;  Surgeon: Baruch Goldmann, MD;  Location: [facility] ORS;  Service: Ophthalmology;  Laterality: Right;  right  . CHOLECYSTECTOMY    . COLONOSCOPY  2007   Dr. Gala Romney: internal hemorrhoids, few scattered sigmoid diverticula  . COLONOSCOPY N/A 04/09/2014   [id number]:[id number] diverticulosis. Redundant colon. Single colonic polyp-removed as described above.  . ESOPHAGOGASTRODUODENOSCOPY  2008   Dr. Gala Romney: erosive esophagitis, patulous EG junction  . ESOPHAGOGASTRODUODENOSCOPY N/A 04/09/2014   [id number] reflux esophagitis. Schatzki's ring-status post dilation as described above. Hiatal hernia. Gastricerosions-status post gastric biopsy  . HIP CLOSED REDUCTION Left 01/15/2016   Procedure: CLOSED REDUCTION LEFT HIP PROSTHESIS;  Surgeon: Carole Civil, MD;  Location: [facility] ORS;  Service: Orthopedics;  Laterality: Left;  . left hip replacement  12/08/2015  . MALONEY DILATION N/A 04/09/2014   Procedure: Venia Minks DILATION;  Surgeon: Daneil Dolin, MD;  Location: [facility] ENDO SUITE;  Service: Endoscopy;  Laterality: N/A;  . REPLACEMENT TOTAL KNEE     bilateral  . SAVORY DILATION N/A 04/09/2014   Procedure: SAVORY DILATION;  Surgeon: Daneil Dolin, MD;  Location: [facility] ENDO SUITE;  Service: Endoscopy;  Laterality: N/A;  . TOENAIL EXCISION     R great toenail; artificial nail  . TOTAL HIP ARTHROPLASTY Left 12/08/2015   Procedure: TOTAL HIP ARTHROPLASTY;  Surgeon: Tim Lair  Aline Brochure, MD;  Location: [facility] ORS;  Service: Orthopedics;  Laterality: Left;  . TOTAL HIP REVISION Left 01/26/2016   Procedure: LEFT TOTAL HIP REVISION;  Surgeon: Carole Civil, MD;  Location: [facility] ORS;  Service: Orthopedics;  Laterality:  Left;    Social History  reports that she quit smoking about 5 years ago. Her smoking use included cigarettes. She has a 54.00 pack-year smoking history. She has never used smokeless tobacco. She reports that she does not drink alcohol and does not use drugs.  Allergies  Allergen Reactions  . Gabapentin Other (See Comments)    suicidal thoughts  . Ibuprofen Other (See Comments)    Kidney problems per caregiver    Family History  Problem Relation Age of Onset  . Stroke Mother   . Diabetes Mother   . Diabetes Father   . Cancer Brother        Leukemia and bone cancer  . Colon cancer Neg Hx   . Heart disease Neg Hx     Prior to Admission medications   Medication Sig Start Date End Date Taking? Authorizing Provider  ALPRAZolam Duanne Moron) 0.5 MG tablet Take 1 tablet (0.5 mg total) by mouth 2 (two) times daily as needed for anxiety. 01/21/21   Manuella Ghazi, Pratik D, DO  aspirin EC 81 MG tablet Take 81 mg by mouth daily.    [provider]  atenolol (TENORMIN) 25 MG tablet Take 1 tablet (25 mg total) by mouth daily. 06/17/18 04/07/19  Arnoldo Lenis, MD  Balsam Peru-Castor Oil [facility]) OINT Apply 1 applicator topically 3 (three) times daily. Apply to bilateral buttocks every shift & PRN for excoriations    [provider]  donepezil (ARICEPT) 5 MG tablet Take 5 mg by mouth daily. 11/12/20   [provider]  feeding supplement (ENSURE ENLIVE / ENSURE PLUS) LIQD Take 237 mLs by mouth 2 (two) times daily between meals. 01/21/21   Manuella Ghazi, Pratik D, DO  hydrALAZINE (APRESOLINE) 25 MG tablet Take 25 mg by mouth 3 (three) times daily.    [provider]  insulin degludec (TRESIBA) 100 UNIT/ML FlexTouch Pen Inject 9 Units into the skin at bedtime. 01/21/21   Manuella Ghazi, Pratik D, DO  methocarbamol (ROBAXIN) 500 MG tablet Take 500 mg by mouth as needed for muscle spasms (Every 8 hours).    [provider]  Multiple Vitamins-Minerals (THEREMS-M) TABS Take 400 mcg by mouth  daily.    [provider]  nicotine (NICODERM CQ - DOSED IN MG/24 HOURS) 21 mg/24hr patch Place 21 mg onto the skin daily.    [provider]  NON FORMULARY Diet: NAS,Consistent Carbohydrate    [provider]  rosuvastatin (CRESTOR) 5 MG tablet Take 5 mg by mouth daily. 10/10/18   [provider]  sertraline (ZOLOFT) 50 MG tablet Take 50 mg by mouth daily. 11/12/20   [provider]  verapamil (CALAN-SR) 240 MG CR tablet Take 240 mg by mouth daily.  01/03/16   [provider]    Physical Exam: Vitals:   02/09/21 1305 02/09/21 1310 02/09/21 1315 02/09/21 1330  BP: (!) 183/78 (!) 153/71 (!) 150/62 (!) 154/63  Pulse: (!) 57 (!) 58 (!) 58 (!) 57  Resp: 14 16 20  (!) 22  Temp:      TempSrc:      SpO2: 97% 96% 95% 93%    Constitutional: Left-sided hemiparesis, positive slurred speech.  No chest pain, no nausea or vomiting. Vitals:   02/09/21 1305 02/09/21 1310  02/09/21 1315 02/09/21 1330  BP: (!) 183/78 (!) 153/71 (!) 150/62 (!) 154/63  Pulse: (!) 57 (!) 58 (!) 58 (!) 57  Resp: 14 16 20  (!) 22  Temp:      TempSrc:      SpO2: 97% 96% 95% 93%   Eyes: PERRL, lids and conjunctivae normal; no icterus. ENMT: Mucous membranes are moist. Posterior pharynx clear of any exudate or lesions. Neck: normal, supple, no masses, no thyromegaly; no JVD. Respiratory: clear to auscultation bilaterally, no wheezing, no crackles. Normal respiratory effort. No accessory muscle use.  Cardiovascular: Rate controlled, no rubs, no gallops, no JVD. Abdomen: no tenderness, no masses palpated. No hepatosplenomegaly. Bowel sounds positive.  Musculoskeletal: no clubbing / cyanosis.  No edema on exam. Skin: no petechiae. Neurologic: Positive slurred speech, sleepy but easily aroused; left-sided hemiparesis appreciated.  Difficulty moving right side secondary to recent ongoing multiple fractures. Psychiatric: Unable to properly assess secondary to underlying dementia;  no hallucinations or agitation.   Labs on Admission: I have personally reviewed following labs and imaging studies  CBC: Recent Labs  Lab 02/09/21 1042  WBC 9.6  NEUTROABS 7.6  HGB 10.2*  HCT 32.1*  MCV 93.9  PLT 450*    Basic Metabolic Panel: Recent Labs  Lab 02/09/21 1042  NA 135  K 3.7  CL 100  CO2 25  GLUCOSE 86  BUN 48*  CREATININE 2.37*  CALCIUM 9.3    GFR: Estimated Creatinine Clearance: 25.1 mL/min (A) (by C-G formula based on SCr of 2.37 mg/dL (H)).  Liver Function Tests: Recent Labs  Lab 02/09/21 1042  AST 19  ALT 21  ALKPHOS 204*  BILITOT 0.8  PROT 6.3*  ALBUMIN 2.9*    Urine analysis:    Component Value Date/Time   COLORURINE YELLOW 01/17/2021 1804   APPEARANCEUR CLOUDY (A) 01/17/2021 1804   LABSPEC 1.012 01/17/2021 1804   PHURINE 7.0 01/17/2021 1804   GLUCOSEU NEGATIVE 01/17/2021 1804   HGBUR NEGATIVE 01/17/2021 1804   BILIRUBINUR NEGATIVE 01/17/2021 1804   KETONESUR NEGATIVE 01/17/2021 1804   PROTEINUR >=300 (A) 01/17/2021 1804   UROBILINOGEN 0.2 04/02/2009 1811   NITRITE POSITIVE (A) 01/17/2021 1804   LEUKOCYTESUR LARGE (A) 01/17/2021 1804    Radiological Exams on Admission: CT HEAD CODE STROKE WO CONTRAST  Result Date: 02/09/2021 CLINICAL DATA:  Code stroke.  Acute neuro deficit EXAM: CT HEAD WITHOUT CONTRAST TECHNIQUE: Contiguous axial images were obtained from the base of the skull through the vertex without intravenous contrast. COMPARISON:  CT head 01/19/2021 FINDINGS: Brain: Acute hemorrhage in the right paracentral pons has developed since the prior study. Acute hemorrhage measures 1.5 x 0.7 x 1.3 cm corresponding to 0.7 mL of blood. No other areas of hemorrhage Generalized atrophy with moderate white matter changes, stable from the prior study. No acute ischemic infarct. No mass lesion identified. Vascular: Negative for hyperdense vessel Skull: Negative Sinuses/Orbits: Paranasal sinuses clear. Bilateral mastoid effusion.  Bilateral cataract extraction Other: Soft tissue density in the parotid gland bilaterally likely prominent vessels and unchanged from prior study. ASPECTS [facility] Stroke Program Early CT Score) Not calculated due to acute hemorrhage in the pons IMPRESSION: 1. Acute hemorrhage in the right paracentral pons. Blood volume 0.7 mL. This is a typical location for hypertensive hemorrhage. 2. Atrophy and chronic microvascular ischemic change in the white matter. 3. These results were called by telephone at the time of interpretation on 02/09/2021 at 10:55 am to provider [facility] , who verbally acknowledged these results. Electronically Signed  By: Franchot Gallo M.D.   On: 02/09/2021 10:55    EKG: Independently reviewed. Sinus rhythm; LVH by tracing and voltage.  Assessment/Plan 1-Pontine hemorrhage (HCC) -Acute hemorrhagic stroke -Left side neglection and hemiparesis -Positive slurred speech -Patient with hypertensive crisis on presentation -Labetalol and clevidipine given -after discussing with patient's son regarding plan of care decision was made for full comfort and symptomatic care. -TRH contacted for end of life care  -Medications not intended for comfort has been discontinue -Comfort feeding has been allowing -Patient started on Dilaudid drip -palliative care service consulted -PRN haldol, ativan and robinul ordered   DVT prophylaxis: SCD's Code Status:   DNR/DNI Family Communication:  No family at bedside, but Patient's case discussed with patient's Son by EDP. Disposition Plan:   Patient is from:  [facility] at a skilled nursing facility.  Anticipated DC to:  To be determined.  Anticipated DC date:  To be determined.  Anticipated DC barriers: Patient stability and disposition; transitioning to full comfort care and will need hospice. Consults called:  Palliative care; neurology Admission status:  Inpatient, length of stay more than 2 midnights; MedSurg.  Severity of  Illness: Severe severity; patient presenting from [facility] center with acute hemorrhagic stroke; slurred speech appreciated on examination along with left side neglection  and hemiparesis.  After discussing with family members and in the presence of acute ongoing process has decided to pursued comfort measures only.  Patient has been admitted for transition of care, end-of-life and if a stable hospice discharge.  High risk for terminal presentation with possibility of hospital death.   Barton Dubois MD [facility]  How to contact the [facility] Attending or Consulting provider [place] or covering provider during after hours [place], for this patient?   1. Check the care team in [place] and look for a) attending/consulting [facility] provider listed and b) the [facility] team listed 2. Log into [email or web address] and use [place]'s universal password to access. If you do not have the password, please contact the hospital operator. 3. Locate the [facility] provider you are looking for under [facility] and page to a number that you can be directly reached. 4. If you still have difficulty reaching the provider, please page the [facility] (Director on Call) for the Hospitalists listed on amion for assistance.  02/09/2021, 7:14 PM

## 2021-02-09 NOTE — Plan of Care (Signed)
  Problem: Coping: Goal: Level of anxiety will decrease Outcome: Not Progressing   Problem: Elimination: Goal: Will not experience complications related to bowel motility Outcome: Not Progressing Goal: Will not experience complications related to urinary retention Outcome: Not Progressing   Problem: Pain Managment: Goal: General experience of comfort will improve Outcome: Not Progressing   Problem: Safety: Goal: Ability to remain free from injury will improve Outcome: Not Progressing   Problem: Skin Integrity: Goal: Risk for impaired skin integrity will decrease Outcome: Not Progressing

## 2021-02-09 NOTE — ED Triage Notes (Addendum)
Pt brought in by [facility] from [facility]. Nursing staff reports pt's LKW at 0800 this morning, pt with slurred speech and flaccid left side. Code stroke activated by EMS en route.   Dr. Almyra Free assessed pt upon EMS arrival and pt was taken straight to CT on EMS stretcher.   Dr. Almyra Free and Tele-neuro RN notified of pt's BP from EMS - 200/100. Dr. Almyra Free said to take pt straight to CT prior to obtaining IV and BP.

## 2021-02-09 NOTE — Progress Notes (Signed)
Location:  [facility] Room Number: 134 Place of Service:  SNF (31)   CODE STATUS: dnr  Allergies  Allergen Reactions  . Gabapentin Other (See Comments)    suicidal thoughts  . Ibuprofen Other (See Comments)    Kidney problems per caregiver   Chief Complaint  Patient presents with  . Acute Visit    Left  side flaccid      HPI:  [name] has been found with flaccid left side. [name] does not have reflexes present. [name] is able to speak; but speech is slightly slurred. [name] will need to go to [facility] hospital stat.    Past Medical History:  Diagnosis Date  . Cancer ([place])    ovarian  . Carpal tunnel syndrome    Nocturnal and positional, bilateral  . COPD (chronic obstructive pulmonary disease) ([place])   . Degenerative joint disease   . Diabetes mellitus    Insulin-dependent diabetes  . Hypercholesteremia   . Hypertension     Past Surgical History:  Procedure Laterality Date  . ABDOMINAL HYSTERECTOMY     BSO  . ABDOMINAL SURGERY     for bleeding after hysterectomy  . CATARACT EXTRACTION W/PHACO Left 07/19/2018   Procedure: CATARACT EXTRACTION PHACO AND INTRAOCULAR LENS PLACEMENT (IOC) LEFT CDE:3.26;  Surgeon: Baruch Goldmann, MD;  Location: [facility] ORS;  Service: Ophthalmology;  Laterality: Left;  . CATARACT EXTRACTION W/PHACO Right 08/23/2018   Procedure: CATARACT EXTRACTION PHACO AND INTRAOCULAR LENS PLACEMENT RIGHT EYE;  Surgeon: Baruch Goldmann, MD;  Location: [facility] ORS;  Service: Ophthalmology;  Laterality: Right;  right  . CHOLECYSTECTOMY    . COLONOSCOPY  2007   Dr. Gala Romney: internal hemorrhoids, few scattered sigmoid diverticula  . COLONOSCOPY N/A 04/09/2014   HBZ:[id number] diverticulosis. Redundant colon. Single colonic polyp-removed as described above.  . ESOPHAGOGASTRODUODENOSCOPY  2008   Dr. Gala Romney: erosive esophagitis, patulous EG junction  . ESOPHAGOGASTRODUODENOSCOPY N/A 04/09/2014   [id number] reflux esophagitis. Schatzki's ring-status post dilation as  described above. Hiatal hernia. Gastricerosions-status post gastric biopsy  . HIP CLOSED REDUCTION Left 01/15/2016   Procedure: CLOSED REDUCTION LEFT HIP PROSTHESIS;  Surgeon: Carole Civil, MD;  Location: [facility] ORS;  Service: Orthopedics;  Laterality: Left;  . left hip replacement  12/08/2015  . MALONEY DILATION N/A 04/09/2014   Procedure: Venia Minks DILATION;  Surgeon: Daneil Dolin, MD;  Location: [facility] ENDO SUITE;  Service: Endoscopy;  Laterality: N/A;  . REPLACEMENT TOTAL KNEE     bilateral  . SAVORY DILATION N/A 04/09/2014   Procedure: SAVORY DILATION;  Surgeon: Daneil Dolin, MD;  Location: [facility] ENDO SUITE;  Service: Endoscopy;  Laterality: N/A;  . TOENAIL EXCISION     R great toenail; artificial nail  . TOTAL HIP ARTHROPLASTY Left 12/08/2015   Procedure: TOTAL HIP ARTHROPLASTY;  Surgeon: Carole Civil, MD;  Location: [facility] ORS;  Service: Orthopedics;  Laterality: Left;  . TOTAL HIP REVISION Left 01/26/2016   Procedure: LEFT TOTAL HIP REVISION;  Surgeon: Carole Civil, MD;  Location: [facility] ORS;  Service: Orthopedics;  Laterality: Left;    Social History   Socioeconomic History  . Marital status: Widowed    Spouse name: Not on file  . Number of children: Not on file  . Years of education: Not on file  . Highest education level: Not on file  Occupational History  . Not on file  Tobacco Use  . Smoking status: Former Smoker    Packs/day: 1.00    Years: 54.00  Pack years: 54.00    Types: Cigarettes    Quit date: 01/17/2016    Years since quitting: 5.0  . Smokeless tobacco: Never Used  . Tobacco comment: Smoked [phone] up to 1 pack per day; [name] did stop  9 months during pregnancy  Vaping Use  . Vaping Use: Never used  Substance and Sexual Activity  . Alcohol use: No    Alcohol/week: 0.0 standard drinks  . Drug use: No  . Sexual activity: Never    Birth control/protection: Surgical  Other Topics Concern  . Not on file  Social History Narrative  . Not on file   Social  Determinants of Health   Financial Resource Strain: Not on file  Food Insecurity: Not on file  Transportation Needs: Not on file  Physical Activity: Not on file  Stress: Not on file  Social Connections: Not on file  Intimate Partner Violence: Not on file   Family History  Problem Relation Age of Onset  . Stroke Mother   . Diabetes Mother   . Diabetes Father   . Cancer Brother        Leukemia and bone cancer  . Colon cancer Neg Hx   . Heart disease Neg Hx       VITAL SIGNS BP (!) 220/100   Pulse 88   Outpatient Encounter Medications as of 02/09/2021  Medication Sig  . ALPRAZolam (XANAX) 0.5 MG tablet Take 1 tablet (0.5 mg total) by mouth 2 (two) times daily as needed for anxiety.  Marland Kitchen aspirin EC 81 MG tablet Take 81 mg by mouth daily.  Marland Kitchen atenolol (TENORMIN) 25 MG tablet Take 1 tablet (25 mg total) by mouth daily.  Roseanne Kaufman Peru-Castor Oil (VENELEX) OINT Apply 1 applicator topically 3 (three) times daily. Apply to bilateral buttocks every shift & PRN for excoriations  . donepezil (ARICEPT) 5 MG tablet Take 5 mg by mouth daily.  . feeding supplement (ENSURE ENLIVE / ENSURE PLUS) LIQD Take 237 mLs by mouth 2 (two) times daily between meals.  . hydrALAZINE (APRESOLINE) 25 MG tablet Take 25 mg by mouth 3 (three) times daily.  . insulin degludec (TRESIBA) 100 UNIT/ML FlexTouch Pen Inject 9 Units into the skin at bedtime.  . methocarbamol (ROBAXIN) 500 MG tablet Take 500 mg by mouth as needed for muscle spasms (Every 8 hours).  . Multiple Vitamins-Minerals (THEREMS-M) TABS Take 400 mcg by mouth daily.  . nicotine (NICODERM CQ - DOSED IN MG/24 HOURS) 21 mg/24hr patch Place 21 mg onto the skin daily.  . NON FORMULARY Diet: NAS,Consistent Carbohydrate  . rosuvastatin (CRESTOR) 5 MG tablet Take 5 mg by mouth daily.  . sertraline (ZOLOFT) 50 MG tablet Take 50 mg by mouth daily.  . verapamil (CALAN-SR) 240 MG CR tablet Take 240 mg by mouth daily.    No facility-administered encounter  medications on file as of 02/09/2021.     SIGNIFICANT DIAGNOSTIC EXAMS   PREVIOUS   01-08-21: chest x-ray: Stable mild cardiomegaly. No active lung disease.   01-08-21: right shoulder x-ray:  1. No acute fracture or subluxation of the right shoulder. 2. Mild acromioclavicular and glenohumeral degenerative change. Small subacromial spur. 3. Subcortical cystic change in the lateral humeral head suggests underlying rotator cuff arthropathy.  01-08-21: right elbow x-ray: Supracondylar distal humeral fracture with a joint effusion   01-08-21: right wrist x-ray: There is no evidence of fracture or dislocation. There is no evidence of arthropathy or other focal bone abnormality. Soft tissues are unremarkable  01-08-21:  right knee x-ray: Comminuted fracture of the distal lateral and medial femur. Fracture lines extend to the femoral component of the knee replacement. Hemarthrosis consistent with the distal femoral fracture.  NO NEW EXAMS.    LABS REVIEWED PREVIOUS   01-08-21: wbc 9.0; hgb 10.8; hct 32.2; mcv 92.3 plt 269; glucose 121; bun 34; creat 2.86; k+ 2.3; na++ 134; ca 8.1; GFR 17; mag 1.3; hgb a1c 6.3 01-09-21: mag 1.4 01-10-21: wbc 11.4; hgb 10.8; hct 33.4; mcv 95.2 plt 279; glucose 129; bun 34; creat 2.56; k+ 3.9; na++ 137; ca 9.0 GFR 20 01-14-21: wbc 9.2; hgb 11.2; hct 35.0; mcv 94.9 plt 292; glucose 98; bun 38; creat 2.19; k+ 4.3; na++ 139; ca 9.3 GFR 24 01-17-21: glucose 119; bun 48; creat 2.64; k+ 4.2; na++ 138; ca 8.7; GFR 19; mag 1.9 01-21-21: wbc 9.6; hgb 10.0; hct 31.9; mcv 96.4 plt 335; glucose 130; bun 44; creat 2.20; k+ 3.8; na++ 141; ca 9.3 GFR 24; mag 2.0 ammonia 22  NO NEW LABS.   Review of Systems  Unable to perform ROS: Medical condition    Physical Exam Constitutional:      General: [name] is not in acute distress.    Appearance: [name] is well-developed. [name] is not diaphoretic.  Neck:     Thyroid: No thyromegaly.  Cardiovascular:     Rate and Rhythm: Normal rate and  regular rhythm.     Pulses: Normal pulses.     Heart sounds: Normal heart sounds.  Pulmonary:     Effort: Pulmonary effort is normal. No respiratory distress.     Breath sounds: Normal breath sounds.  Abdominal:     General: Bowel sounds are normal. There is no distension.     Palpations: Abdomen is soft.     Tenderness: There is no abdominal tenderness.  Musculoskeletal:     Cervical back: Neck supple.     Right lower leg: No edema.     Left lower leg: No edema.     Comments: Right arm in ace/sling  Right knee splint in place Left side is flaccid   Lymphadenopathy:     Cervical: No cervical adenopathy.  Skin:    General: Skin is warm and dry.  Neurological:     Comments: Is aware        ASSESSMENT/ PLAN:   TODAY  1. Flaccid hemiplegia affecting left nondominant side unspecified etiology  Will sent to the ED for further assessment and treatment options.      Ok Edwards NP [facility]  Contact [phone] Monday through Friday 8am- 5pm  After hours call [phone]

## 2021-02-09 NOTE — ED Notes (Addendum)
Tele-neuro MD notified of pt's BP of 220/110.

## 2021-02-09 NOTE — Progress Notes (Signed)
Neurology Telestroke Note  Telestroke was called for patient who developed flaccid L side and slurred speech at her facility this AM. LKW 0800. Patient was briefly examined via video conference after CT and was able to answer RN questions appropriately, was plegic on the L, and was able to weakly move fingers on R (she also has multiple fractures on the R side likely impairing movement). Head CT showed pontine bleed. SBP 220, instructions given to push labetalol then start clevidipine with goal SBP <160. Patient is DNAR and palliative care is involved on an outpatient basis although it is not clear if she is actually on hospice at this point. I discussed her case with Dr. Almyra Free by phone who will take over at this point. He will call [name] son [phone] and determine if family wishes for her to be made comfort care. If so she would be admitted to hospitalist service at [facility], if not she would be transferred to ICU at [facility]. Dr. Almyra Free has my cell phone number and pager if I can be of any further assistance.  Su Monks, MD [facility] [phone]  If 7pm- 7am, please page neurology on call as listed in [place].

## 2021-02-09 NOTE — ED Notes (Signed)
Tele-neuro MD reports that SBP goal is <160 and that Dr. Almyra Free is now taking over and will calling pt's son and will update [place] with next plan of care.

## 2021-02-09 NOTE — ED Notes (Signed)
Pump had to be stopped due to loss of access.

## 2021-02-09 NOTE — ED Notes (Signed)
Verified with Dr. Dyann Kief that pt is on comfort measures now, therefore neuro checks won't be performed any longer.

## 2021-02-09 NOTE — Progress Notes (Signed)
Report given by Norm Salt, RN to Adrian Prince, [facility]- Patient currently comfortable and transitioned to comfort measures. Admitted to 325.

## 2021-02-09 NOTE — ED Provider Notes (Signed)
[facility] EMERGENCY DEPARTMENT Provider Note   CSN: [id number] Arrival date & time: 02/09/21  1036  An emergency department physician performed an initial assessment on this suspected stroke patient at 1038.  History Chief Complaint  Patient presents with  . Code Stroke    [name] is a 70 y.o. female.  Patient presents chief complaint of concern for stroke.  She was last seen normal at 8:00 in the morning by nursing staff.  She was checked on again about 30 minutes prior to arrival and found to have slurred speech and left-sided weakness.  She has been in the hospital for fracture of the right elbow and right knee, now found with new weakness in the left side concerning for stroke.  No reports of fevers or cough or vomiting or diarrhea.        Past Medical History:  Diagnosis Date  . Cancer ([place])    ovarian  . Carpal tunnel syndrome    Nocturnal and positional, bilateral  . COPD (chronic obstructive pulmonary disease) ([place])   . Degenerative joint disease   . Diabetes mellitus    Insulin-dependent diabetes  . Hypercholesteremia   . Hypertension     Patient Active Problem List   Diagnosis Date Noted  . Flaccid hemiplegia affecting left nondominant side ([place]) 02/09/2021  . 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 Hyperlipidemia associated with type 2 diabetes mellitus ([place]) 01/24/2021  . Anemia due to stage 4 chronic kidney disease ([place]) 01/24/2021  . Anxiety 01/24/2021  . Aortic atherosclerosis ([place]) 01/24/2021  . COPD (chronic obstructive pulmonary disease) ([place]) 01/09/2021  . Right Distal femoral fracture/Rt Knee Fx/Periprosthetic Not Amenable to Operative Fixation 01/08/2021  . Right Distal Humeral Fracture/Rt Elbow Fracture-Not Amenable to Operative Fixation 01/08/2021  . AKI (acute kidney injury) ([place]) 01/08/2021  . Hypokalemia 01/08/2021  . Fall at home, initial encounter  01/08/2021  . Hyperlipidemia 01/08/2021  . Obesity (BMI 30.0-34.9) 01/08/2021  . Dementia without behavioral disturbance ([place]) 01/08/2021  . Hypomagnesemia 01/08/2021  . Posterior vitreous detachment, both eyes 07/19/2020  . 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 Essential hypertension 02/10/2016  . Former smoker 02/01/2016  . Anemia, unspecified 02/01/2016  . Osteoarthritis of left hip   . GERD (gastroesophageal reflux disease) 04/02/2013  . Chronic bronchitis with acute exacerbation ([place]) 04/02/2013  . History of ovarian cancer 04/02/2013  . Tobacco abuse 04/02/2013  . Spinal stenosis, lumbar region, with neurogenic claudication 02/27/2013  . Iliotibial band tendonitis 02/27/2013  . DEGENERATIVE DISC DISEASE, LUMBOSACRAL SPINE 12/09/2009  . H N P-LUMBAR 09/29/2009  . KNEE, ARTHRITIS, DEGEN./OSTEO 09/23/2008  . SCIATICA 05/14/2008  . CKD stage 4 due to type 2 diabetes mellitus ([place]) 05/12/2008    Past Surgical History:  Procedure Laterality Date  . ABDOMINAL HYSTERECTOMY     BSO  . ABDOMINAL SURGERY     for bleeding after hysterectomy  . CATARACT EXTRACTION W/PHACO Left 07/19/2018   Procedure: CATARACT EXTRACTION PHACO AND INTRAOCULAR LENS PLACEMENT (IOC) LEFT CDE:3.26;  Surgeon: Baruch Goldmann, MD;  Location: [facility];  Service: Ophthalmology;  Laterality: Left;  . CATARACT EXTRACTION W/PHACO Right 08/23/2018   Procedure: CATARACT EXTRACTION PHACO AND INTRAOCULAR LENS PLACEMENT RIGHT EYE;  Surgeon: Baruch Goldmann, MD;  Location: [facility];  Service: Ophthalmology;  Laterality: Right;  right  .  CHOLECYSTECTOMY    . COLONOSCOPY  2007   Dr. Gala Romney: internal hemorrhoids, few scattered sigmoid diverticula  . COLONOSCOPY N/A 04/09/2014   DDU:KGURKYH diverticulosis. Redundant colon. Single colonic polyp-removed as described  above.  . ESOPHAGOGASTRODUODENOSCOPY  2008   Dr. Gala Romney: erosive esophagitis, patulous EG junction  . ESOPHAGOGASTRODUODENOSCOPY N/A 04/09/2014   CWC:BJSEGBT reflux esophagitis. Schatzki's ring-status post dilation as described above. Hiatal hernia. Gastricerosions-status post gastric biopsy  . HIP CLOSED REDUCTION Left 01/15/2016   Procedure: CLOSED REDUCTION LEFT HIP PROSTHESIS;  Surgeon: Carole Civil, MD;  Location: [facility];  Service: Orthopedics;  Laterality: Left;  . left hip replacement  12/08/2015  . MALONEY DILATION N/A 04/09/2014   Procedure: Venia Minks DILATION;  Surgeon: Daneil Dolin, MD;  Location: [facility];  Service: Endoscopy;  Laterality: N/A;  . REPLACEMENT TOTAL KNEE     bilateral  . SAVORY DILATION N/A 04/09/2014   Procedure: SAVORY DILATION;  Surgeon: Daneil Dolin, MD;  Location: [facility];  Service: Endoscopy;  Laterality: N/A;  . TOENAIL EXCISION     R great toenail; artificial nail  . TOTAL HIP ARTHROPLASTY Left 12/08/2015   Procedure: TOTAL HIP ARTHROPLASTY;  Surgeon: Carole Civil, MD;  Location: [facility];  Service: Orthopedics;  Laterality: Left;  . TOTAL HIP REVISION Left 01/26/2016   Procedure: LEFT TOTAL HIP REVISION;  Surgeon: Carole Civil, MD;  Location: [facility];  Service: Orthopedics;  Laterality: Left;     OB History   No obstetric history on file.     Family History  Problem Relation Age of Onset  . Stroke Mother   . Diabetes Mother   . Diabetes Father   . Cancer Brother        Leukemia and bone cancer  . Colon cancer Neg Hx   . Heart disease Neg Hx     Social History   Tobacco Use  . Smoking status: Former Smoker    Packs/day: 1.00    Years: 54.00    Pack years: 54.00    Types: Cigarettes    Quit date: 01/17/2016    Years since quitting: 5.0  . Smokeless tobacco: Never Used  . Tobacco comment: Smoked [phone] up to 1 pack per day; she did stop  9 months during pregnancy  Vaping Use  . Vaping Use: Never used   Substance Use Topics  . Alcohol use: No    Alcohol/week: 0.0 standard drinks  . Drug use: No    Home Medications Prior to Admission medications   Medication Sig Start Date End Date Taking? Authorizing Provider  ALPRAZolam Duanne Moron) 0.5 MG tablet Take 1 tablet (0.5 mg total) by mouth 2 (two) times daily as needed for anxiety. 01/21/21   Manuella Ghazi, Pratik D, DO  aspirin EC 81 MG tablet Take 81 mg by mouth daily.    [provider]  atenolol (TENORMIN) 25 MG tablet Take 1 tablet (25 mg total) by mouth daily. 06/17/18 04/07/19  Arnoldo Lenis, MD  Balsam [place]-Castor Oil [facility]) OINT Apply 1 applicator topically 3 (three) times daily. Apply to bilateral buttocks every shift & PRN for excoriations    [provider]  donepezil (ARICEPT) 5 MG tablet Take 5 mg by mouth daily. 11/12/20   [provider]  feeding supplement (ENSURE ENLIVE / ENSURE PLUS) LIQD Take 237 mLs by mouth 2 (two) times daily between meals. 01/21/21   Manuella Ghazi, Pratik D, DO  hydrALAZINE (APRESOLINE) 25 MG tablet Take 25 mg by mouth 3 (  three) times daily.    [provider]  insulin degludec (TRESIBA) 100 UNIT/ML FlexTouch Pen Inject 9 Units into the skin at bedtime. 01/21/21   Manuella Ghazi, Pratik D, DO  methocarbamol (ROBAXIN) 500 MG tablet Take 500 mg by mouth as needed for muscle spasms (Every 8 hours).    [provider]  Multiple Vitamins-Minerals (THEREMS-M) TABS Take 400 mcg by mouth daily.    [provider]  nicotine (NICODERM CQ - DOSED IN MG/24 HOURS) 21 mg/24hr patch Place 21 mg onto the skin daily.    [provider]  NON FORMULARY Diet: NAS,Consistent Carbohydrate    [provider]  rosuvastatin (CRESTOR) 5 MG tablet Take 5 mg by mouth daily. 10/10/18   [provider]  sertraline (ZOLOFT) 50 MG tablet Take 50 mg by mouth daily. 11/12/20   [provider]  verapamil (CALAN-SR) 240 MG CR tablet Take 240 mg by mouth daily.  01/03/16   [provider]    Allergies    Gabapentin and Ibuprofen  Review of Systems   Review of Systems  Constitutional: Negative for fever.  HENT: Negative for ear pain.   Eyes: Negative for pain.  Respiratory: Negative for cough.   Cardiovascular: Negative for chest pain.  Gastrointestinal: Negative for abdominal pain.  Genitourinary: Negative for flank pain.  Musculoskeletal: Negative for back pain.  Skin: Negative for rash.  Neurological: Negative for headaches.    Physical Exam Updated Vital Signs BP (!) 229/87   Pulse (!) 59   Temp (!) 97.5 F (36.4 C) (Oral)   Resp 18   SpO2 95%   Physical Exam Constitutional:      General: She is not in acute distress.    Appearance: Normal appearance.  HENT:     Head: Normocephalic.     Nose: Nose normal.  Eyes:     Extraocular Movements: Extraocular movements intact.  Cardiovascular:     Rate and Rhythm: Normal rate.  Pulmonary:     Effort: Pulmonary effort is normal.  Musculoskeletal:        General: Normal range of motion.     Cervical back: Normal range of motion.  Neurological:     Mental Status: She is alert.     Comments: Patient had left-sided neglect, right-sided gaze preference, left-sided facial droop noted left upper and left lower extremity weakness noted 3 out of 5 strength.     ED Results / Procedures / Treatments   Labs (all labs ordered are listed, but only abnormal results are displayed) Labs Reviewed  CBC - Abnormal; Notable for the following components:      Result Value   RBC 3.42 (*)    Hemoglobin 10.2 (*)    HCT 32.1 (*)    Platelets 450 (*)    All other components within normal limits  COMPREHENSIVE METABOLIC PANEL - Abnormal; Notable for the following components:   BUN 48 (*)    Creatinine, Ser 2.37 (*)    Total Protein 6.3 (*)    Albumin 2.9 (*)    Alkaline Phosphatase 204 (*)    GFR, Estimated 22 (*)    All other components within normal limits  RESP PANEL BY RT-PCR (FLU A&B, COVID)  ARPGX2  ETHANOL  PROTIME-INR  APTT  DIFFERENTIAL  RAPID URINE DRUG SCREEN, HOSP PERFORMED  URINALYSIS, ROUTINE W REFLEX MICROSCOPIC  CBG MONITORING, ED    EKG None  Radiology CT HEAD CODE STROKE WO CONTRAST  Result Date: 02/09/2021 CLINICAL DATA:  Code stroke.  Acute neuro deficit EXAM: CT HEAD WITHOUT CONTRAST TECHNIQUE: Contiguous axial images were obtained from the base of the skull through the vertex without intravenous contrast. COMPARISON:  CT head 01/19/2021 FINDINGS: Brain: Acute hemorrhage in the right paracentral pons has developed since the prior study. Acute hemorrhage measures 1.5 x 0.7 x 1.3 cm corresponding to 0.7 mL of blood. No other areas of hemorrhage Generalized atrophy with moderate white matter changes, stable from the prior study. No acute ischemic infarct. No mass lesion identified. Vascular: Negative for hyperdense vessel Skull: Negative Sinuses/Orbits: Paranasal sinuses clear. Bilateral mastoid effusion. Bilateral cataract extraction Other: Soft tissue density in the parotid gland bilaterally likely prominent vessels and unchanged from prior study. ASPECTS [facility] Stroke Program Early CT Score) Not calculated due to acute hemorrhage in the pons IMPRESSION: 1. Acute hemorrhage in the right paracentral pons. Blood volume 0.7 mL. This is a typical location for hypertensive hemorrhage. 2. Atrophy and chronic microvascular ischemic change in the white matter. 3. These results were called by telephone at the time of interpretation on 02/09/2021 at 10:55 am to provider [facility] , who verbally acknowledged these results. Electronically Signed   By: Franchot Gallo M.D.   On: 02/09/2021 10:55    Procedures .Critical Care Performed by: Luna Fuse, MD Authorized by: Luna Fuse, MD   Critical care provider statement:    Critical care time (minutes):  40   Critical care time was exclusive of:  Separately billable procedures and treating other patients   Critical care  was necessary to treat or prevent imminent or life-threatening deterioration of the following conditions:  CNS failure or compromise     Medications Ordered in ED Medications  clevidipine (CLEVIPREX) infusion 0.5 mg/mL (4 mg/hr Intravenous Rate/Dose Change 02/09/21 1126)  labetalol (NORMODYNE) injection 10 mg (10 mg Intravenous Given 02/09/21 1106)    ED Course  I have reviewed the triage vital signs and the nursing notes.  Pertinent labs & imaging results that were available during my care of the patient were reviewed by me and considered in my medical decision making (see chart for details).    MDM Rules/Calculators/A&P                          Blood pressure elevated to about 053 systolic.  Code stroke was activated.  Patient seen by the stroke team, CT imaging shows a pontine hemorrhage.  Patient given IV labetalol and started on Cleviprex IV.  Patient has a baseline history of dementia and recent fractures from a nursing home.  Case discussed with patient's family member Mr.Pat Patrick.  On discussion patient is to continue to be DNR DNI, they are pursuing palliative care.  They wish to just make her comfortable at this time.  I advised him that given the location of this bleed this may have the possibility of being a terminal hemorrhage and that she may pass away from this.  Patient expressed understanding and expressed that he wanted her to be made comfortable doing this time.  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

## 2021-02-10 ENCOUNTER — Ambulatory Visit: Payer: Medicare Other | Admitting: Orthopedic Surgery

## 2021-02-10 DIAGNOSIS — I613 Nontraumatic intracerebral hemorrhage in brain stem: Secondary | ICD-10-CM | POA: Diagnosis not present

## 2021-02-10 DIAGNOSIS — S42411A Displaced simple supracondylar fracture without intercondylar fracture of right humerus, initial encounter for closed fracture: Secondary | ICD-10-CM | POA: Insufficient documentation

## 2021-02-10 DIAGNOSIS — Z515 Encounter for palliative care: Secondary | ICD-10-CM | POA: Diagnosis not present

## 2021-02-10 MED ORDER — HALOPERIDOL LACTATE 5 MG/ML IJ SOLN: 0.5000 mg | INTRAMUSCULAR | Status: AC | PRN

## 2021-02-10 MED ORDER — GLYCOPYRROLATE 0.2 MG/ML IJ SOLN: 0.2000 mg | INTRAMUSCULAR | Status: AC | PRN

## 2021-02-10 MED ORDER — BIOTENE DRY MOUTH MT LIQD: 15.0000 mL | OROMUCOSAL | Status: AC | PRN

## 2021-02-10 MED ORDER — HYDROMORPHONE BOLUS VIA INFUSION: 0.2000 mg | INTRAVENOUS | Status: AC | PRN

## 2021-02-10 MED ORDER — LORAZEPAM 2 MG/ML IJ SOLN: 1.0000 mg | Freq: Four times a day (QID) | INTRAMUSCULAR | 0 refills | Status: AC | PRN

## 2021-02-10 MED ORDER — GUAIFENESIN-CODEINE 100-10 MG/5ML PO SOLN: 5.0000 mL | ORAL | Status: DC | PRN

## 2021-02-10 MED ORDER — POLYVINYL ALCOHOL 1.4 % OP SOLN: 1.0000 [drp] | Freq: Four times a day (QID) | OPHTHALMIC | Status: AC | PRN

## 2021-02-10 MED ORDER — SODIUM CHLORIDE 0.9 % IV SOLN: 0.5000 mg/h | INTRAVENOUS | Status: AC

## 2021-02-10 NOTE — TOC Progression Note (Signed)
Transition of Care [facility]) - Progression Note    Patient Details  Name: [name] MRN: [id number] Date of Birth: March 30, 1951  [facility] CM/SW Contact  Ihor Gully, LCSW Phone Number: 02/10/2021, 2:50 PM  Clinical Narrative:    Spoke with son, [name], discussed residential hospice. Provided choices of residential hospice facilities. Family chooses [facility]. Referral made to [facility].         Expected Discharge Plan and Services                                                 Social Determinants of Health (SDOH) Interventions    Readmission Risk Interventions Readmission Risk Prevention Plan 01/11/2021  Medication Screening Complete  Transportation Screening Complete  Some recent data might be hidden

## 2021-02-10 NOTE — Progress Notes (Signed)
Referral made to Chaplain by Physician for EOL spiritual support. Found [name] very comfortable and without signs of pain or discomfort today with her son and daughter in law present bedside. Engaged them in life review around [place] upbringing in [place] and [place], her relationships with her two children and her nurturing nature. She was very involved with her church and a very devoted and faithful lady of [employer] [place]. Chaplain provided support and encouraged the son and family to utilize the time they had with her until she passed. Chaplain had a prayer to close the visit today.  They are interested in residential hospice if there is a bed available.  Chaplain will continue to provide spiritual support and to assess for spiritual need.

## 2021-02-10 NOTE — Discharge Summary (Signed)
Physician Discharge Summary  [name] ESP:[id number] DOB: August 06, 1951 DOA: 02/09/2021  PCP: [name], MD  Admit date: 02/09/2021 Discharge date: 02/10/2021  Time spent: 35 minutes  Recommendations for Outpatient Follow-up:  1. Symptomatic management and end-of-life care.   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 female with medical history significant of history of COPD, hypertension, hyperlipidemia, dementia, insulin-dependent diabetes and history of ovarian cancer; who presented from [facility] (where  patient was admitted after experiencing mechanical fall with multiple fractures on the right side of her body) 2 acute left-sided weakness and slurred speech.  Patient was last seen normal around AM this morning.  There was no complaints of chest pain, palpitations, fever, chills, abdominal pain, dysuria, hematuria, melena, hematochezia or any other complaints.  ED Course: EKG demonstrating sinus rhythm; CT head positive for acute pontine hemorrhagic stroke.  Patient found to be in hypertensive crisis.  Case discussed with neurology who recommended transfer to ICU and [facility] if invasive and aggressive therapy wanted; Care discussed with family member who felt that at this moment based on further decline in patient's status will prefer to keep patient at [facility] and focus on full comfort care only.   Hospital Course:  1-acute hemorrhagic pontine stroke -Patient with left-sided neglection and hemiparesis -After advance care planning and CODE STATUS discussion determined to pursued comfort care and symptomatic management only -Currently no swallowing, not  able to talk appropriately or answer any questions -stable vital signs and in no acute distress. -will continue full comfort care and symptomatic management  -patient to be transfer to residential hospice.   Procedures:  See below for x-ray reports   Consultations: Tele-neurology  Palliative care Spiritual care Hospice   Discharge Exam: Vitals:   02/09/21 1330 02/10/21 0500  BP: (!) 154/63 (!) 168/80  Pulse: (!) 57 (!) 58  Resp: (!) 22   Temp:    SpO2: 93% 97%    General: Patient appears comfortable and in no acute distress; stable vital signs appreciated.  Currently unable to answer questions and demonstrating left-sided hemiparesis/neglection. Cardiovascular: Regular rate, no rubs, no gallops, no JVD. Respiratory: Clear to auscultation; no wheezing or crackles. Abdomen: Soft, nontender, nontender, positive bowel sounds. Extremities: Right upper extremity on sling, Right lower extremity with knee immobilizer in place.  No cyanosis or clubbing appreciated.  Discharge Instructions   Discharge Instructions    Discharge instructions   Complete by: As directed    Comfort feeding EOL care, Symptomatic management and comfort care.     Allergies as of 02/10/2021      Reactions   Gabapentin Other (See Comments)   suicidal thoughts   Ibuprofen Other (See Comments)   Kidney problems per caregiver      Medication List    STOP taking these medications   ALPRAZolam 0.5 MG tablet Commonly known as: XANAX   aspirin EC 81 MG tablet   atenolol 25 MG tablet Commonly known as: TENORMIN   donepezil 5 MG tablet Commonly known as: ARICEPT   feeding supplement Liqd   hydrALAZINE 25 MG tablet Commonly known as: APRESOLINE   insulin degludec 100 UNIT/ML FlexTouch Pen Commonly known as: TRESIBA   methocarbamol 500 MG tablet Commonly known  as: ROBAXIN   nicotine 21 mg/24hr patch Commonly known as: NICODERM CQ - dosed in mg/24 hours   NON FORMULARY   rosuvastatin 5  MG tablet Commonly known as: CRESTOR   sertraline 50 MG tablet Commonly known as: ZOLOFT   Therems-M Tabs   verapamil 240 MG CR tablet Commonly known as: CALAN-SR     TAKE these medications   antiseptic oral rinse Liqd Apply 15 mLs topically as needed for dry mouth.   glycopyrrolate 0.2 MG/ML injection Commonly known as: ROBINUL Inject 1 mL (0.2 mg total) into the vein every 4 (four) hours as needed (excessive secretions).   haloperidol lactate 5 MG/ML injection Commonly known as: HALDOL Inject 0.1 mLs (0.5 mg total) into the vein every 4 (four) hours as needed (or delirium).   HYDROmorphone 2 mg/mL Soln Commonly known as: DILAUDID Inject 0.2 mg into the vein every 30 (thirty) minutes as needed (uncontrolled pain, distress or if respiratory rate is greater than 25).   HYDROmorphone 50 mg in sodium chloride 0.9 % 95 mL Inject 0.5-2 mg/hr into the vein continuous.   LORazepam 2 MG/ML injection Commonly known as: ATIVAN Inject 0.5-1 mLs (1-2 mg total) into the vein every 6 (six) hours as needed for anxiety, seizure or sedation (EOL care).   polyvinyl alcohol 1.4 % ophthalmic solution Commonly known as: LIQUIFILM TEARS Place 1 drop into both eyes 4 (four) times daily as needed for dry eyes.   Venelex Oint Apply 1 applicator topically 3 (three) times daily. Apply to bilateral buttocks every shift & PRN for excoriations      Allergies  Allergen Reactions  . Gabapentin Other (See Comments)    suicidal thoughts  . Ibuprofen Other (See Comments)    Kidney problems per caregiver    Contact information for after-discharge care    [place] .   Service: Inpatient Hospice Contact information: [address] [place] [phone]                  The results of significant diagnostics from this hospitalization (including imaging, microbiology, ancillary and laboratory) are listed below for reference.     Significant Diagnostic Studies: CT HEAD WO CONTRAST  Result Date: 01/19/2021 CLINICAL DATA:  Mental status change, unknown cause. Additional history provided: Sudden onset altered mental status, recent injury to arm. EXAM: CT HEAD WITHOUT CONTRAST TECHNIQUE: Contiguous axial images were obtained from the base of the skull through the vertex without intravenous contrast. COMPARISON:  Prior head CT examinations 01/08/2021. FINDINGS: Brain: Mild motion degradation. Mild cerebral atrophy. Moderate patchy and ill-defined hypoattenuation within the cerebral white matter, nonspecific but compatible with chronic small vessel ischemic disease. There is no acute intracranial hemorrhage. No demarcated cortical infarct. No extra-axial fluid collection. No evidence of intracranial mass. No midline shift. Vascular: No hyperdense vessel.  Atherosclerotic calcifications. Skull: Normal. Negative for fracture or focal lesion. Sinuses/Orbits: Visualized orbits show no acute finding. Mild bilateral ethmoid sinus mucosal thickening. Small right sphenoid sinus mucous retention cyst. Other: Large bilateral mastoid effusions. IMPRESSION: Mildly motion degraded examination. No evidence of acute intracranial abnormality. Mild cerebral atrophy and moderate cerebral white matter chronic small vessel ischemic disease, stable as compared to the head CT of 01/08/2021. Electronically Signed   By: Kellie Simmering DO   On: 01/19/2021 13:54   CT HEAD CODE STROKE WO CONTRAST  Result Date: 02/09/2021 CLINICAL DATA:  Code stroke.  Acute neuro deficit EXAM: CT HEAD WITHOUT  CONTRAST TECHNIQUE: Contiguous axial images were obtained from the base of the skull through the vertex without intravenous contrast. COMPARISON:  CT head 01/19/2021 FINDINGS: Brain: Acute hemorrhage in the right paracentral pons has developed since the prior study. Acute hemorrhage measures 1.5 x 0.7 x 1.3 cm corresponding to 0.7 mL of blood. No other areas of hemorrhage  Generalized atrophy with moderate white matter changes, stable from the prior study. No acute ischemic infarct. No mass lesion identified. Vascular: Negative for hyperdense vessel Skull: Negative Sinuses/Orbits: Paranasal sinuses clear. Bilateral mastoid effusion. Bilateral cataract extraction Other: Soft tissue density in the parotid gland bilaterally likely prominent vessels and unchanged from prior study. ASPECTS [facility] Stroke Program Early CT Score) Not calculated due to acute hemorrhage in the pons IMPRESSION: 1. Acute hemorrhage in the right paracentral pons. Blood volume 0.7 mL. This is a typical location for hypertensive hemorrhage. 2. Atrophy and chronic microvascular ischemic change in the white matter. 3. These results were called by telephone at the time of interpretation on 02/09/2021 at 10:55 am to provider [facility] , who verbally acknowledged these results. Electronically Signed   By: Franchot Gallo M.D.   On: 02/09/2021 10:55   Microbiology: Recent Results (from the past 240 hour(s))  Resp Panel by RT-PCR (Flu A&B, Covid) Nasopharyngeal Swab     Status: None   Collection Time: 02/09/21 10:42 AM   Specimen: Nasopharyngeal Swab; Nasopharyngeal(NP) swabs in vial transport medium  Result Value Ref Range Status   SARS Coronavirus 2 by RT PCR NEGATIVE NEGATIVE Final    Comment: (NOTE) SARS-CoV-2 target nucleic acids are NOT DETECTED.  The SARS-CoV-2 RNA is generally detectable in upper respiratory specimens during the acute phase of infection. The lowest concentration of SARS-CoV-2 viral copies this assay can detect is 138 copies/mL. A negative result does not preclude SARS-Cov-2 infection and should not be used as the sole basis for treatment or other patient management decisions. A negative result may occur with  improper specimen collection/handling, submission of specimen other than nasopharyngeal swab, presence of viral mutation(s) within the areas targeted by this assay, and  inadequate number of viral copies(<138 copies/mL). A negative result must be combined with clinical observations, patient history, and epidemiological information. The expected result is Negative.  Fact Sheet for Patients:  EntrepreneurPulse.com.au  Fact Sheet for Healthcare Providers:  IncredibleEmployment.be  This test is no t yet approved or cleared by the [place] FDA and  has been authorized for detection and/or diagnosis of SARS-CoV-2 by FDA under an Emergency Use Authorization (EUA). This EUA will remain  in effect (meaning this test can be used) for the duration of the COVID-19 declaration under Section 564(b)(1) of the Act, 21 U.S.C.section 360bbb-3(b)(1), unless the authorization is terminated  or revoked sooner.       Influenza A by PCR NEGATIVE NEGATIVE Final   Influenza B by PCR NEGATIVE NEGATIVE Final    Comment: (NOTE) The Xpert Xpress SARS-CoV-2/FLU/RSV plus assay is intended as an aid in the diagnosis of influenza from Nasopharyngeal swab specimens and should not be used as a sole basis for treatment. Nasal washings and aspirates are unacceptable for Xpert Xpress SARS-CoV-2/FLU/RSV testing.  Fact Sheet for Patients: EntrepreneurPulse.com.au  Fact Sheet for Healthcare Providers: IncredibleEmployment.be  This test is not yet approved or cleared by the [place] FDA and has been authorized for detection and/or diagnosis of SARS-CoV-2 by FDA under an Emergency Use Authorization (EUA). This EUA will remain in effect (meaning this test can be used) for  the duration of the COVID-19 declaration under Section 564(b)(1) of the Act, 21 U.S.C. section 360bbb-3(b)(1), unless the authorization is terminated or revoked.  Performed at [facility], [address] [place], [place] [address]      Labs: Basic Metabolic Panel: Recent Labs  Lab 02/09/21 1042  NA 135  K 3.7  CL 100  CO2  25  GLUCOSE 86  BUN 48*  CREATININE 2.37*  CALCIUM 9.3   Liver Function Tests: Recent Labs  Lab 02/09/21 1042  AST 19  ALT 21  ALKPHOS 204*  BILITOT 0.8  PROT 6.3*  ALBUMIN 2.9*   CBC: Recent Labs  Lab 02/09/21 1042  WBC 9.6  NEUTROABS 7.6  HGB 10.2*  HCT 32.1*  MCV 93.9  PLT 450*   CBG: Recent Labs  Lab 02/09/21 1053  GLUCAP 83   Signed:  Barton Dubois MD.  [facility] 02/10/2021, 4:44 PM

## 2021-02-10 NOTE — TOC Transition Note (Signed)
Transition of Care [facility]) - CM/SW Discharge Note   Patient Details  Name: [name] MRN: [id number] Date of Birth: 1951-08-16  [facility] CM/SW Contact:  Ihor Gully, LCSW Phone Number: 02/10/2021, 4:34 PM   Clinical Narrative:    Patient to admit to [place]. RN to call report and EMS upon discharge.    Final next level of care: [place] Barriers to Discharge: No Barriers Identified   Patient Goals and CMS Choice Patient states their goals for this hospitalization and ongoing recovery are:: [facility] house      Discharge Placement              Patient chooses bed at: Other - please specify in the comment section below: ([place]) Patient to be transferred to facility by: [place] Name of family member notified: [name], son Patient and family notified of of transfer: 02/10/21  Discharge Plan and Services                                     Social Determinants of Health (SDOH) Interventions     Readmission Risk Interventions Readmission Risk Prevention Plan 01/11/2021  Medication Screening Complete  Transportation Screening Complete  Some recent data might be hidden

## 2021-02-10 NOTE — Progress Notes (Signed)
Palliative: [name] is now full comfort care.  She is lying quietly in bed, and appears comfortable.  Her son, [name] is at bedside, but is also sleeping soundly.  PMT will return later in the afternoon. Chaplain has spoke with family who requests residential hospice.  TOC team update on family request.   Conference with attending, bedside nursing staff, transition of care team related to patient condition, needs, goals of care, disposition.  Plan: Full comfort care. Requesting comfort and dignity at end of life, residential hospice at [facility] with [facility].   25 minutes  Quinn Axe, NP Palliative medicine team Team phone [phone] Greater than 50% of this time was spent counseling and coordinating care related to the above assessment and plan.

## 2021-02-15 DIAGNOSIS — E785 Hyperlipidemia, unspecified: Secondary | ICD-10-CM | POA: Diagnosis not present

## 2021-02-15 DIAGNOSIS — E1165 Type 2 diabetes mellitus with hyperglycemia: Secondary | ICD-10-CM | POA: Diagnosis not present

## 2021-02-15 DIAGNOSIS — I10 Essential (primary) hypertension: Secondary | ICD-10-CM | POA: Diagnosis not present

## 2021-03-18 DEATH — deceased

## 2022-12-14 ENCOUNTER — Ambulatory Visit: Payer: Medicare HMO | Admitting: Orthopedic Surgery
